# Patient Record
Sex: Female | Born: 1935 | Race: White | Hispanic: No | Marital: Single | Smoking: Former smoker
Health system: Southern US, Community
[De-identification: ages and names within clinical notes are randomized; demographics above are authoritative.]

## PROBLEM LIST (undated history)

## (undated) DIAGNOSIS — I10 Essential (primary) hypertension: Secondary | ICD-10-CM

## (undated) DIAGNOSIS — I5032 Chronic diastolic (congestive) heart failure: Secondary | ICD-10-CM

## (undated) DIAGNOSIS — I43 Cardiomyopathy in diseases classified elsewhere: Secondary | ICD-10-CM

## (undated) DIAGNOSIS — E785 Hyperlipidemia, unspecified: Secondary | ICD-10-CM

## (undated) DIAGNOSIS — E7800 Pure hypercholesterolemia, unspecified: Secondary | ICD-10-CM

## (undated) DIAGNOSIS — R000 Tachycardia, unspecified: Secondary | ICD-10-CM

## (undated) DIAGNOSIS — R413 Other amnesia: Secondary | ICD-10-CM

## (undated) DIAGNOSIS — E119 Type 2 diabetes mellitus without complications: Secondary | ICD-10-CM

## (undated) DIAGNOSIS — Z7901 Long term (current) use of anticoagulants: Secondary | ICD-10-CM

## (undated) DIAGNOSIS — K7689 Other specified diseases of liver: Secondary | ICD-10-CM

## (undated) DIAGNOSIS — I4891 Unspecified atrial fibrillation: Secondary | ICD-10-CM

## (undated) HISTORY — DX: Long term (current) use of anticoagulants: Z79.01

## (undated) HISTORY — PX: BACK SURGERY: SHX140

## (undated) HISTORY — DX: Essential (primary) hypertension: I10

## (undated) HISTORY — DX: Tachycardia, unspecified: R00.0

## (undated) HISTORY — DX: Other specified diseases of liver: K76.89

## (undated) HISTORY — DX: Cardiomyopathy in diseases classified elsewhere: I43

## (undated) HISTORY — DX: Chronic diastolic (congestive) heart failure: I50.32

## (undated) HISTORY — PX: APPENDECTOMY: SHX54

## (undated) HISTORY — DX: Pure hypercholesterolemia, unspecified: E78.00

## (undated) HISTORY — PX: TONSILLECTOMY: SUR1361

## (undated) HISTORY — PX: CHOLECYSTECTOMY: SHX55

---

## 2011-04-23 DIAGNOSIS — L608 Other nail disorders: Secondary | ICD-10-CM | POA: Diagnosis not present

## 2011-04-23 DIAGNOSIS — L821 Other seborrheic keratosis: Secondary | ICD-10-CM | POA: Diagnosis not present

## 2011-04-23 DIAGNOSIS — L819 Disorder of pigmentation, unspecified: Secondary | ICD-10-CM | POA: Diagnosis not present

## 2011-04-23 DIAGNOSIS — D1801 Hemangioma of skin and subcutaneous tissue: Secondary | ICD-10-CM | POA: Diagnosis not present

## 2011-08-13 DIAGNOSIS — Z0000 Encounter for general adult medical examination without abnormal findings: Secondary | ICD-10-CM | POA: Diagnosis not present

## 2011-08-13 DIAGNOSIS — E7800 Pure hypercholesterolemia, unspecified: Secondary | ICD-10-CM | POA: Diagnosis not present

## 2011-08-13 DIAGNOSIS — I10 Essential (primary) hypertension: Secondary | ICD-10-CM | POA: Diagnosis not present

## 2011-08-13 DIAGNOSIS — E119 Type 2 diabetes mellitus without complications: Secondary | ICD-10-CM | POA: Diagnosis not present

## 2011-08-13 DIAGNOSIS — G479 Sleep disorder, unspecified: Secondary | ICD-10-CM | POA: Diagnosis not present

## 2011-08-14 DIAGNOSIS — R7989 Other specified abnormal findings of blood chemistry: Secondary | ICD-10-CM | POA: Diagnosis not present

## 2011-08-14 DIAGNOSIS — E7800 Pure hypercholesterolemia, unspecified: Secondary | ICD-10-CM | POA: Diagnosis not present

## 2011-08-14 DIAGNOSIS — I10 Essential (primary) hypertension: Secondary | ICD-10-CM | POA: Diagnosis not present

## 2011-08-14 DIAGNOSIS — E119 Type 2 diabetes mellitus without complications: Secondary | ICD-10-CM | POA: Diagnosis not present

## 2011-08-14 DIAGNOSIS — Z79899 Other long term (current) drug therapy: Secondary | ICD-10-CM | POA: Diagnosis not present

## 2011-12-20 DIAGNOSIS — Z23 Encounter for immunization: Secondary | ICD-10-CM | POA: Diagnosis not present

## 2012-02-16 DIAGNOSIS — E119 Type 2 diabetes mellitus without complications: Secondary | ICD-10-CM | POA: Diagnosis not present

## 2012-02-16 DIAGNOSIS — I10 Essential (primary) hypertension: Secondary | ICD-10-CM | POA: Diagnosis not present

## 2012-03-17 DIAGNOSIS — K649 Unspecified hemorrhoids: Secondary | ICD-10-CM | POA: Diagnosis not present

## 2012-03-17 DIAGNOSIS — I10 Essential (primary) hypertension: Secondary | ICD-10-CM | POA: Diagnosis not present

## 2012-03-17 DIAGNOSIS — R42 Dizziness and giddiness: Secondary | ICD-10-CM | POA: Diagnosis not present

## 2012-03-26 DIAGNOSIS — R42 Dizziness and giddiness: Secondary | ICD-10-CM | POA: Diagnosis not present

## 2012-03-26 DIAGNOSIS — I10 Essential (primary) hypertension: Secondary | ICD-10-CM | POA: Diagnosis not present

## 2012-03-26 DIAGNOSIS — R9431 Abnormal electrocardiogram [ECG] [EKG]: Secondary | ICD-10-CM | POA: Diagnosis not present

## 2012-03-26 DIAGNOSIS — R002 Palpitations: Secondary | ICD-10-CM | POA: Diagnosis not present

## 2012-03-26 DIAGNOSIS — I498 Other specified cardiac arrhythmias: Secondary | ICD-10-CM | POA: Diagnosis not present

## 2012-04-01 DIAGNOSIS — I498 Other specified cardiac arrhythmias: Secondary | ICD-10-CM | POA: Diagnosis not present

## 2012-04-01 DIAGNOSIS — R002 Palpitations: Secondary | ICD-10-CM | POA: Diagnosis not present

## 2012-04-01 DIAGNOSIS — R9431 Abnormal electrocardiogram [ECG] [EKG]: Secondary | ICD-10-CM | POA: Diagnosis not present

## 2012-04-01 DIAGNOSIS — R42 Dizziness and giddiness: Secondary | ICD-10-CM | POA: Diagnosis not present

## 2012-04-01 DIAGNOSIS — I10 Essential (primary) hypertension: Secondary | ICD-10-CM | POA: Diagnosis not present

## 2012-05-10 DIAGNOSIS — I10 Essential (primary) hypertension: Secondary | ICD-10-CM | POA: Diagnosis not present

## 2012-05-10 DIAGNOSIS — E7800 Pure hypercholesterolemia, unspecified: Secondary | ICD-10-CM | POA: Diagnosis not present

## 2012-05-10 DIAGNOSIS — M545 Low back pain, unspecified: Secondary | ICD-10-CM | POA: Diagnosis not present

## 2012-05-10 DIAGNOSIS — M25559 Pain in unspecified hip: Secondary | ICD-10-CM | POA: Diagnosis not present

## 2012-06-15 ENCOUNTER — Other Ambulatory Visit: Payer: Self-pay | Admitting: Family Medicine

## 2012-06-21 ENCOUNTER — Other Ambulatory Visit: Payer: Self-pay

## 2012-06-24 ENCOUNTER — Ambulatory Visit
Admission: RE | Admit: 2012-06-24 | Discharge: 2012-06-24 | Disposition: A | Discharge status: Home / Self Care | Payer: Medicare Other | Source: Ambulatory Visit | Attending: Family Medicine | Admitting: Family Medicine

## 2012-06-24 DIAGNOSIS — K7689 Other specified diseases of liver: Secondary | ICD-10-CM

## 2012-07-29 DIAGNOSIS — I43 Cardiomyopathy in diseases classified elsewhere: Secondary | ICD-10-CM

## 2012-07-29 DIAGNOSIS — I428 Other cardiomyopathies: Secondary | ICD-10-CM

## 2012-07-29 HISTORY — DX: Other cardiomyopathies: I42.8

## 2012-07-29 HISTORY — DX: Cardiomyopathy in diseases classified elsewhere: I43

## 2012-08-02 ENCOUNTER — Encounter (HOSPITAL_COMMUNITY): Payer: Self-pay | Admitting: Neurology

## 2012-08-02 ENCOUNTER — Inpatient Hospital Stay (HOSPITAL_COMMUNITY)
Admission: EM | Admit: 2012-08-02 | Discharge: 2012-08-10 | DRG: 308 | Disposition: A | Discharge status: Home / Self Care | Payer: Medicare Other | Attending: Cardiology | Admitting: Cardiology

## 2012-08-02 ENCOUNTER — Emergency Department (HOSPITAL_COMMUNITY): Payer: Medicare Other

## 2012-08-02 DIAGNOSIS — E785 Hyperlipidemia, unspecified: Secondary | ICD-10-CM | POA: Diagnosis present

## 2012-08-02 DIAGNOSIS — E7800 Pure hypercholesterolemia, unspecified: Secondary | ICD-10-CM

## 2012-08-02 DIAGNOSIS — R0989 Other specified symptoms and signs involving the circulatory and respiratory systems: Secondary | ICD-10-CM | POA: Diagnosis not present

## 2012-08-02 DIAGNOSIS — I10 Essential (primary) hypertension: Secondary | ICD-10-CM | POA: Diagnosis present

## 2012-08-02 DIAGNOSIS — R000 Tachycardia, unspecified: Secondary | ICD-10-CM | POA: Diagnosis not present

## 2012-08-02 DIAGNOSIS — R42 Dizziness and giddiness: Secondary | ICD-10-CM | POA: Diagnosis not present

## 2012-08-02 DIAGNOSIS — I5021 Acute systolic (congestive) heart failure: Secondary | ICD-10-CM | POA: Diagnosis not present

## 2012-08-02 DIAGNOSIS — I509 Heart failure, unspecified: Secondary | ICD-10-CM | POA: Diagnosis not present

## 2012-08-02 DIAGNOSIS — R0600 Dyspnea, unspecified: Secondary | ICD-10-CM

## 2012-08-02 DIAGNOSIS — I4891 Unspecified atrial fibrillation: Principal | ICD-10-CM

## 2012-08-02 DIAGNOSIS — K7689 Other specified diseases of liver: Secondary | ICD-10-CM | POA: Insufficient documentation

## 2012-08-02 DIAGNOSIS — E119 Type 2 diabetes mellitus without complications: Secondary | ICD-10-CM | POA: Diagnosis present

## 2012-08-02 DIAGNOSIS — I079 Rheumatic tricuspid valve disease, unspecified: Secondary | ICD-10-CM | POA: Diagnosis present

## 2012-08-02 DIAGNOSIS — Z7901 Long term (current) use of anticoagulants: Secondary | ICD-10-CM

## 2012-08-02 DIAGNOSIS — R0602 Shortness of breath: Secondary | ICD-10-CM | POA: Diagnosis not present

## 2012-08-02 DIAGNOSIS — I379 Nonrheumatic pulmonary valve disorder, unspecified: Secondary | ICD-10-CM | POA: Diagnosis present

## 2012-08-02 DIAGNOSIS — Z940 Kidney transplant status: Secondary | ICD-10-CM | POA: Diagnosis not present

## 2012-08-02 DIAGNOSIS — I428 Other cardiomyopathies: Secondary | ICD-10-CM | POA: Diagnosis present

## 2012-08-02 DIAGNOSIS — I480 Paroxysmal atrial fibrillation: Secondary | ICD-10-CM | POA: Diagnosis present

## 2012-08-02 DIAGNOSIS — I499 Cardiac arrhythmia, unspecified: Secondary | ICD-10-CM | POA: Diagnosis not present

## 2012-08-02 DIAGNOSIS — Z5181 Encounter for therapeutic drug level monitoring: Secondary | ICD-10-CM

## 2012-08-02 DIAGNOSIS — R0609 Other forms of dyspnea: Secondary | ICD-10-CM | POA: Diagnosis not present

## 2012-08-02 DIAGNOSIS — I340 Nonrheumatic mitral (valve) insufficiency: Secondary | ICD-10-CM

## 2012-08-02 HISTORY — DX: Essential (primary) hypertension: I10

## 2012-08-02 HISTORY — DX: Hyperlipidemia, unspecified: E78.5

## 2012-08-02 HISTORY — DX: Type 2 diabetes mellitus without complications: E11.9

## 2012-08-02 LAB — MAGNESIUM: Magnesium: 2 mg/dL (ref 1.5–2.5)

## 2012-08-02 LAB — POCT I-STAT TROPONIN I: Troponin i, poc: 0 ng/mL (ref 0.00–0.08)

## 2012-08-02 LAB — PROTIME-INR
INR: 1.07 (ref 0.00–1.49)
Prothrombin Time: 13.8 seconds (ref 11.6–15.2)

## 2012-08-02 LAB — COMPREHENSIVE METABOLIC PANEL
ALT: 55 U/L — ABNORMAL HIGH (ref 0–35)
BUN: 15 mg/dL (ref 6–23)
CO2: 21 mEq/L (ref 19–32)
Calcium: 9.3 mg/dL (ref 8.4–10.5)
Glucose, Bld: 130 mg/dL — ABNORMAL HIGH (ref 70–99)
Sodium: 140 mEq/L (ref 135–145)
Total Bilirubin: 0.5 mg/dL (ref 0.3–1.2)

## 2012-08-02 LAB — CBC WITH DIFFERENTIAL/PLATELET
Eosinophils Absolute: 0.1 10*3/uL (ref 0.0–0.7)
Eosinophils Relative: 1 % (ref 0–5)
HCT: 38.5 % (ref 36.0–46.0)
Hemoglobin: 13.2 g/dL (ref 12.0–15.0)
Lymphocytes Relative: 21 % (ref 12–46)
Lymphs Abs: 1.8 10*3/uL (ref 0.7–4.0)
MCH: 33.3 pg (ref 26.0–34.0)
MCV: 97.2 fL (ref 78.0–100.0)
Monocytes Relative: 8 % (ref 3–12)
Platelets: 208 10*3/uL (ref 150–400)
RBC: 3.96 MIL/uL (ref 3.87–5.11)
WBC: 8.7 10*3/uL (ref 4.0–10.5)

## 2012-08-02 LAB — T4, FREE: Free T4: 1.61 ng/dL (ref 0.80–1.80)

## 2012-08-02 LAB — TROPONIN I: Troponin I: <0.3 ng/mL (ref <0.30)

## 2012-08-02 MED ORDER — METOPROLOL TARTRATE 1 MG/ML IV SOLN
5.0000 mg | Freq: Once | INTRAVENOUS | Status: AC
  Administered 2012-08-02: 5 mg via INTRAVENOUS
  Filled 2012-08-02: qty 5

## 2012-08-02 MED ORDER — ATORVASTATIN CALCIUM 10 MG PO TABS
10.0000 mg | ORAL_TABLET | Freq: Every day | ORAL | Status: DC
  Administered 2012-08-02 – 2012-08-10 (×9): 10 mg via ORAL
  Filled 2012-08-02 (×9): qty 1

## 2012-08-02 MED ORDER — METOPROLOL TARTRATE 25 MG PO TABS
25.0000 mg | ORAL_TABLET | Freq: Four times a day (QID) | ORAL | Status: DC
  Administered 2012-08-02 – 2012-08-03 (×3): 25 mg via ORAL
  Filled 2012-08-02 (×8): qty 1

## 2012-08-02 MED ORDER — RAMIPRIL 5 MG PO CAPS
5.0000 mg | ORAL_CAPSULE | Freq: Every day | ORAL | Status: DC
  Administered 2012-08-03 – 2012-08-04 (×2): 5 mg via ORAL
  Filled 2012-08-02 (×3): qty 1

## 2012-08-02 MED ORDER — METOPROLOL TARTRATE 1 MG/ML IV SOLN
INTRAVENOUS | Status: AC
  Administered 2012-08-02: 5 mg via INTRAVENOUS
  Filled 2012-08-02: qty 5

## 2012-08-02 MED ORDER — ONDANSETRON HCL 4 MG/2ML IJ SOLN: 4.0000 mg | Freq: Four times a day (QID) | INTRAMUSCULAR | Status: DC | PRN

## 2012-08-02 MED ORDER — PIOGLITAZONE HCL 45 MG PO TABS
45.0000 mg | ORAL_TABLET | Freq: Every day | ORAL | Status: DC
  Administered 2012-08-03 – 2012-08-10 (×8): 45 mg via ORAL
  Filled 2012-08-02 (×11): qty 1

## 2012-08-02 MED ORDER — SODIUM CHLORIDE 0.9 % IJ SOLN
3.0000 mL | INTRAMUSCULAR | Status: DC | PRN
  Administered 2012-08-03: 3 mL via INTRAVENOUS

## 2012-08-02 MED ORDER — METOPROLOL TARTRATE 1 MG/ML IV SOLN: 5.0000 mg | Freq: Once | INTRAVENOUS | Status: AC

## 2012-08-02 MED ORDER — SODIUM CHLORIDE 0.9 % IJ SOLN
3.0000 mL | Freq: Two times a day (BID) | INTRAMUSCULAR | Status: DC
  Administered 2012-08-02 – 2012-08-09 (×6): 3 mL via INTRAVENOUS

## 2012-08-02 MED ORDER — APIXABAN 2.5 MG PO TABS
2.5000 mg | ORAL_TABLET | Freq: Two times a day (BID) | ORAL | Status: DC
  Administered 2012-08-02 – 2012-08-10 (×16): 2.5 mg via ORAL
  Filled 2012-08-02 (×17): qty 1

## 2012-08-02 MED ORDER — DILTIAZEM HCL 100 MG IV SOLR
5.0000 mg/h | INTRAVENOUS | Status: DC
  Administered 2012-08-02: 5 mg/h via INTRAVENOUS

## 2012-08-02 MED ORDER — FUROSEMIDE 10 MG/ML IJ SOLN
40.0000 mg | Freq: Two times a day (BID) | INTRAMUSCULAR | Status: DC
  Administered 2012-08-02 – 2012-08-04 (×5): 40 mg via INTRAVENOUS
  Filled 2012-08-02 (×8): qty 4

## 2012-08-02 MED ORDER — SODIUM CHLORIDE 0.9 % IV SOLN
250.0000 mL | INTRAVENOUS | Status: DC | PRN
  Administered 2012-08-05: 250 mL via INTRAVENOUS

## 2012-08-02 MED ORDER — POTASSIUM CHLORIDE CRYS ER 20 MEQ PO TBCR
20.0000 meq | EXTENDED_RELEASE_TABLET | Freq: Two times a day (BID) | ORAL | Status: DC
  Administered 2012-08-02 – 2012-08-10 (×16): 20 meq via ORAL
  Filled 2012-08-02 (×19): qty 1

## 2012-08-02 MED ORDER — DILTIAZEM HCL 25 MG/5ML IV SOLN
10.0000 mg | Freq: Once | INTRAVENOUS | Status: AC
  Administered 2012-08-02: 10 mg via INTRAVENOUS

## 2012-08-02 MED ORDER — ACETAMINOPHEN 325 MG PO TABS: 650.0000 mg | ORAL_TABLET | ORAL | Status: DC | PRN

## 2012-08-02 MED ORDER — ZOLPIDEM TARTRATE 5 MG PO TABS
5.0000 mg | ORAL_TABLET | Freq: Every evening | ORAL | Status: DC | PRN
  Administered 2012-08-02 – 2012-08-09 (×8): 5 mg via ORAL
  Filled 2012-08-02 (×8): qty 1

## 2012-08-02 NOTE — ED Notes (Signed)
Pt eating dinner, HR 123. A x 4

## 2012-08-02 NOTE — H&P (Signed)
Admit date: 08/02/2012 Primary Physician  Dr. Barbaraann Barthel Primary Cardiologist  Dr. Mayford Knife  CC: Atrial fibrillation with rapid ventricular response and, shortness of breath  HPI: 79 year old female with previously diagnosed heart arrhythmia who used to see a cardiologist in [place] but recently moved up here to be with her 2 children who have chronic illnesses including kidney transplant, open heart surgery who is here in the emergency department with rapid atrial fibrillation. Over the past month or so she states that she's been becoming weaker and over the last week she has felt some shortness of breath. She states that she has had a mild cough but she attributes this to the pollen in the air. She denies any recent fevers, chills, bleeding episodes, strokelike symptoms. She states that her son is a Development worker, community. She does not recall ever being spoken to about Coumadin. She does not recall being told she has atrial fibrillation. She was however on digoxin which was discontinued by Dr. Mayford Knife during her referral visit because of bradycardia. She remained on diltiazem 180 mg once a day. She denies any orthopnea but she states she has had mild shortness of breath with activity and feels somewhat drained.  An echocardiogram 04/01/12 was performed which demonstrated normal ejection fraction with mild TR. Liver cysts were noted and are being followed by primary physician.      PMH:   Past Medical History  Diagnosis Date  . Hypertension   . Diabetes mellitus without complication   . Hyperlipidemia     PSH:   Past Surgical History  Procedure Laterality Date  . Back surgery     Allergies:  Codeine and Lortab Prior to Admit Meds:   (Not in a hospital admission) Fam HX:   No family history on file. Social HX:    History   Social History  . Marital Status: Single    Spouse Name: N/A    Number of Children: N/A  . Years of Education: N/A   Occupational History  . Not on file.   Social History Main  Topics  . Smoking status: Never Smoker   . Smokeless tobacco: Not on file  . Alcohol Use: Yes  . Drug Use: No  . Sexually Active: Not on file   Other Topics Concern  . Not on file   Social History Narrative  . No narrative on file     ROS:  All 11 ROS were addressed and are negative except what is stated in the HPI  Physical Exam: Blood pressure 128/89, pulse 158, temperature 97.4 F (36.3 C), temperature source Oral, resp. rate 21, SpO2 96.00%.    General: Thin, elderly female, in no acute distress  Head: Eyes PERRLA, No xanthomas.   Normal cephalic and atramatic  Lungs:   Clear bilaterally to auscultation and percussion. Normal respiratory effort. No wheezes, no rales. Heart:  Irregularly irregular, tachycardic Pulses are 2+ & equal.            No carotid bruit. No JVD.  No abdominal bruits. No femoral bruits. Abdomen: Bowel sounds are positive, abdomen soft and non-tender without masses No hepatosplenomegaly. Msk:  Back normal. Normal strength and tone for age. Extremities:   No clubbing, cyanosis. 1+ edema bilateral extremities.  DP +1 Neuro: Alert and oriented X 3, non-focal, MAE x 4 GU: Deferred Rectal: Deferred Psych:  Good affect, responds appropriately    Labs:   Lab Results  Component Value Date   WBC 8.7 08/02/2012   HGB 13.2 08/02/2012  HCT 38.5 08/02/2012   MCV 97.2 08/02/2012   PLT 208 08/02/2012    Recent Labs Lab 08/02/12 1400  NA 140  K 4.1  CL 106  CO2 21  BUN 15  CREATININE 0.75  CALCIUM 9.3  PROT 6.5  BILITOT 0.5  ALKPHOS 80  ALT 55*  AST 42*  GLUCOSE 130*   No results found for this basename: PTT   Lab Results  Component Value Date   INR 1.07 08/02/2012   CBC    Component Value Date/Time   WBC 8.7 08/02/2012 1400   RBC 3.96 08/02/2012 1400   HGB 13.2 08/02/2012 1400   HCT 38.5 08/02/2012 1400   PLT 208 08/02/2012 1400   MCV 97.2 08/02/2012 1400   MCH 33.3 08/02/2012 1400   MCHC 34.3 08/02/2012 1400   RDW 15.0 08/02/2012 1400   LYMPHSABS 1.8  08/02/2012 1400   MONOABS 0.7 08/02/2012 1400   EOSABS 0.1 08/02/2012 1400   BASOSABS 0.0 08/02/2012 1400   BNP (last 3 results)  Recent Labs  08/02/12 1349  PROBNP 1982.0*     Radiology:  Dg Chest Portable 1 View  08/02/2012  *RADIOLOGY REPORT*  Clinical Data: Shortness of breath, presents with symptoms of atrial fibrillation  PORTABLE CHEST - 1 VIEW  Comparison: None.  Findings:  There is obscuration of the left heart border is secondary to a small to moderate-sized left-sided pleural effusion and associated left basilar heterogeneous / consolidative opacities.  Small right- sided effusion with associated right basilar heterogeneous opacities.  Lung volumes are slightly reduced. The pulmonary vasculature is indistinct with cephalization of flow.  No definite pneumothorax.  No definite acute osseous abnormalities.  IMPRESSION: Overall findings suggestive of pulmonary edema with moderate left and small right-sided effusion and associated bibasilar opacities, atelectasis versus infiltrate.  A follow-up chest radiograph in 4 to 6 weeks after treatment is recommended to ensure resolution.   Original Report Authenticated By: Tacey Ruiz, MD    Personally viewed.   EKG:  AFIB with RVR 140's Personally viewed.  ASSESSMENT/PLAN:   79 year old female with newly diagnosed atrial fibrillation with rapid ventricular response, pleural effusion, shortness of breath, diastolic heart failure acute, with comorbidities of diabetes, hypertension, hyperlipidemia.  1. Atrial fibrillation-earlier in emergency room stay, IV diltiazem as well as drip up to 15 mg per hour did not reduce her heart rate. After administration of IV metoprolol 5 mg and discontinuation of diltiazem drip, her heart rate responded fairly well to this And when I was discussing her case with her, her heart rate was mostly in the 110s to 120s.  I will start her on by mouth metoprolol 25 mg by mouth every 6 hours of course monitoring her closely for  any evidence of bradycardia. Her digoxin was discontinued in January because of bradycardia. She did not wear Holter monitor at that time. Telemetry will be valuable for [place] here in the hospital. I will check TSH.  2. Chronic anticoagulation-we discussed at length the risk of stroke with atrial fibrillation and because of this, I will initiate anticoagulation with Eliquis 2.5 mg twice a day because of her age and weight. She is normally 122 pounds. Currently she is likely volume overloaded with her most recently being 132 pounds.   3. Acute diastolic heart failure-I will check an echocardiogram to ensure that she does not have any evidence of systolic dysfunction in the setting of tachycardia. I will give her IV furosemide 40 mg twice a day. Replete potassium as  necessary.  4. Diabetes-continue home medication.   5. Hypertension-I will continue ACE inhibitor.  6. Hyperlipidemia-I will continue Crestor 5 mg.  Donato Schultz, MD  08/02/2012  4:32 PM

## 2012-08-02 NOTE — ED Provider Notes (Addendum)
History     CSN: [id number]  Arrival date & time 08/02/12  1255   First MD Initiated Contact with Patient 08/02/12 1259      Chief Complaint  Patient presents with  . Atrial Fibrillation    (Consider location/radiation/quality/duration/timing/severity/associated sxs/prior treatment) HPI  Patient states she moved here from [place] about one year ago. She states she's had a irregular heartbeat for years and was followed regularly by a cardiologist in [place] with stress tests. She has never been on anti-coagulation except for a regular aspirin a day. She reports over the past 2 weeks she is started feeling like she is dragging and she feels mentally foggy and she has dizziness with no energy. She denies any chest pain but states she feels short of breath. She states she feels worse when she lays flat and she feels better if she sleeps sitting up in a chair. She also is noted her legs have been swelling over the past week. She has had a dry hacking cough without fever or congestion. She reports she has seen her doctor and had an EKG and echo done by Dr. Mayford Knife of [facility] cardiology. She is supposed to be getting a cardiac monitor placed soon.  PCP Dr Barbaraann Barthel Cardiologist Dr Mayford Knife  Past Medical History  Diagnosis Date  . Hypertension   . Diabetes mellitus without complication   . Hyperlipidemia     Past Surgical History  Procedure Laterality Date  . Back surgery      No family history on file.  History  Substance Use Topics  . Smoking status: Never Smoker   . Smokeless tobacco: Not on file  . Alcohol Use: Yes  lives at home Lives alone Drinks red wine with dinner  OB History   Grav Para Term Preterm Abortions TAB SAB Ect Mult Living                  Review of Systems  All other systems reviewed and are negative.    Allergies  Codeine and Lortab  Home Medications   Current Outpatient Rx  Name  Route  Sig  Dispense  Refill  . aspirin EC 325 MG tablet   Oral    Take 325 mg by mouth daily.         [name] Kitchen diltiazem (TIAZAC) 180 MG 24 hr capsule   Oral   Take 180 mg by mouth daily before breakfast.         . hydrocortisone (ANUSOL-HC) 25 MG suppository   Rectal   Place 25 mg rectally 2 (two) times daily.         . Multiple Vitamin (MULTIVITAMIN WITH MINERALS) TABS   Oral   Take 1 tablet by mouth daily.         [name] Kitchen OVER THE COUNTER MEDICATION   Oral   Take 200 mg by mouth at bedtime. Take two 100mg  stool softener capsules before bedtime         . pioglitazone (ACTOS) 45 MG tablet   Oral   Take 45 mg by mouth daily.         . Probiotic Product (PROBIOTIC DAILY PO)   Oral   Take by mouth.         . ramipril (ALTACE) 5 MG capsule   Oral   Take 5 mg by mouth daily.         . rosuvastatin (CRESTOR) 5 MG tablet   Oral   Take 5 mg by mouth daily.         [name] Kitchen  zolpidem (AMBIEN) 5 MG tablet   Oral   Take 5 mg by mouth at bedtime as needed for sleep.           BP 139/88  Pulse 156  Temp(Src) 97.4 F (36.3 C) (Oral)  Resp 22  SpO2 98%  Vital signs normal except tachycardia     Physical Exam  Nursing note and vitals reviewed. Constitutional: She is oriented to person, place, and time.  Non-toxic appearance. She does not appear ill. No distress.  Thin elderly female, pleasant and alert  HENT:  Head: Normocephalic and atraumatic.  Right Ear: External ear normal.  Left Ear: External ear normal.  Nose: Nose normal. No mucosal edema or rhinorrhea.  Mouth/Throat: Oropharynx is clear and moist and mucous membranes are normal. No dental abscesses or edematous.  Eyes: Conjunctivae and EOM are normal. Pupils are equal, round, and reactive to light.  Neck: Normal range of motion and full passive range of motion without pain. Neck supple.  Cardiovascular: Normal heart sounds.  An irregularly irregular rhythm present. Tachycardia present.  Exam reveals no gallop and no friction rub.   No murmur heard. Pulmonary/Chest: Effort  normal and breath sounds normal. No respiratory distress. She has no wheezes. She has no rhonchi. She has no rales. She exhibits no tenderness and no crepitus.  Abdominal: Soft. Normal appearance and bowel sounds are normal. She exhibits no distension. There is no tenderness. There is no rebound and no guarding.  Musculoskeletal: Normal range of motion. She exhibits edema. She exhibits no tenderness.  Moves all extremities well.  Mild swelling of legs without pitting  Neurological: She is alert and oriented to person, place, and time. She has normal strength. No cranial nerve deficit.  Skin: Skin is warm, dry and intact. No rash noted. No erythema. No pallor.  Psychiatric: She has a normal mood and affect. Her speech is normal and behavior is normal. Her mood appears not anxious.    ED Course  Procedures (including critical care time) Medications  diltiazem (CARDIZEM) 100 mg in dextrose 5 % 100 mL infusion (0 mg/hr Intravenous Stopped 08/02/12 1550)  diltiazem (CARDIZEM) injection 10 mg (0 mg Intravenous Stopped 08/02/12 1554)  metoprolol (LOPRESSOR) injection 5 mg (5 mg Intravenous Given 08/02/12 1551)    Review of paperwork sent from her doctor's office. Patient was seen by Dr. Mayford Knife in January and had an EKG done which showed bradycardia with heart rate of 52. At that time her digoxin was stopped. She also had a echocardiogram at that time which showed her ejection fraction was 56%. She had trace pulmonary valve regurgitation, mild tricuspid regurgitation, grade 2 diastolic dysfunction of the mitral valve with increased left atrial dilatation. Her inferior vena cava was also dilated. Her aortic valve was noted to be sclerotic but opening well.  15:45 pt still in afib with RVR on diltiazem 15 mg/hr and her HR is 146-160. Will stop the diltiazem drip and give lopressor. Pt given results of her tests.   15:54 Dr Anne Fu, will see patient, agrees with using lopressor and stopping the diltiazem.    16:30 Dr Anne Fu, asks to repeat the lopressor, it has brought her HR down to 120's range.   Results for orders placed during the hospital encounter of 08/02/12  CBC WITH DIFFERENTIAL      Result Value Range   WBC 8.7  4.0 - 10.5 K/uL   RBC 3.96  3.87 - 5.11 MIL/uL   Hemoglobin 13.2  12.0 - 15.0 g/dL  HCT 38.5  36.0 - 46.0 %   MCV 97.2  78.0 - 100.0 fL   MCH 33.3  26.0 - 34.0 pg   MCHC 34.3  30.0 - 36.0 g/dL   RDW 96.0  45.4 - 09.8 %   Platelets 208  150 - 400 K/uL   Neutrophils Relative 70  43 - 77 %   Neutro Abs 6.1  1.7 - 7.7 K/uL   Lymphocytes Relative 21  12 - 46 %   Lymphs Abs 1.8  0.7 - 4.0 K/uL   Monocytes Relative 8  3 - 12 %   Monocytes Absolute 0.7  0.1 - 1.0 K/uL   Eosinophils Relative 1  0 - 5 %   Eosinophils Absolute 0.1  0.0 - 0.7 K/uL   Basophils Relative 0  0 - 1 %   Basophils Absolute 0.0  0.0 - 0.1 K/uL  COMPREHENSIVE METABOLIC PANEL      Result Value Range   Sodium 140  135 - 145 mEq/L   Potassium 4.1  3.5 - 5.1 mEq/L   Chloride 106  96 - 112 mEq/L   CO2 21  19 - 32 mEq/L   Glucose, Bld 130 (*) 70 - 99 mg/dL   BUN 15  6 - 23 mg/dL   Creatinine, Ser 1.19  0.50 - 1.10 mg/dL   Calcium 9.3  8.4 - 14.7 mg/dL   Total Protein 6.5  6.0 - 8.3 g/dL   Albumin 3.4 (*) 3.5 - 5.2 g/dL   AST 42 (*) 0 - 37 U/L   ALT 55 (*) 0 - 35 U/L   Alkaline Phosphatase 80  39 - 117 U/L   Total Bilirubin 0.5  0.3 - 1.2 mg/dL   GFR calc non Af Amer 75 (*) >90 mL/min   GFR calc Af Amer 87 (*) >90 mL/min  PRO B NATRIURETIC PEPTIDE      Result Value Range   Pro B Natriuretic peptide (BNP) 1982.0 (*) 0 - 450 pg/mL  MAGNESIUM      Result Value Range   Magnesium 2.0  1.5 - 2.5 mg/dL  APTT      Result Value Range   aPTT 36  24 - 37 seconds  PROTIME-INR      Result Value Range   Prothrombin Time 13.8  11.6 - 15.2 seconds   INR 1.07  0.00 - 1.49  POCT I-STAT TROPONIN I      Result Value Range   Troponin i, poc 0.00  0.00 - 0.08 ng/mL   Comment 3             Laboratory  interpretation all normal except elevated BNP   Dg Chest Portable 1 View  08/02/2012  *RADIOLOGY REPORT*  Clinical Data: Shortness of breath, presents with symptoms of atrial fibrillation  PORTABLE CHEST - 1 VIEW  Comparison: None.  Findings:  There is obscuration of the left heart border is secondary to a small to moderate-sized left-sided pleural effusion and associated left basilar heterogeneous / consolidative opacities.  Small right- sided effusion with associated right basilar heterogeneous opacities.  Lung volumes are slightly reduced. The pulmonary vasculature is indistinct with cephalization of flow.  No definite pneumothorax.  No definite acute osseous abnormalities.  IMPRESSION: Overall findings suggestive of pulmonary edema with moderate left and small right-sided effusion and associated bibasilar opacities, atelectasis versus infiltrate.  A follow-up chest radiograph in 4 to 6 weeks after treatment is recommended to ensure resolution.   Original Report Authenticated By: Jonny Ruiz  Judithann Sheen, MD      Date: 08/02/2012  Rate: 163  Rhythm: atrial fibrillation  QRS Axis: normal  Intervals: normal  ST/T Wave abnormalities: nonspecific T wave changes  Conduction Disutrbances:none  Narrative Interpretation: PRWP  Old EKG Reviewed: changes noted from 03/17/2012 SB at rate 53    1. Atrial fibrillation with rapid ventricular response   2. CHF, acute   New onset  3. Dyspnea     Plan admission  Devoria Albe, MD, FACEP    MDM          Ward Givens, MD 08/02/12 5784  Ward Givens, MD 08/02/12 [phone]

## 2012-08-02 NOTE — ED Notes (Signed)
Per EMS- Pt comes from [facility] went in with weakness, dizziness, SOB for several weeks with SOB worsening over past few days. EKG showing AFIB with RVR HR 150, given 10 mg cardizem. BP 150/110. Pt a x 4. Denies pain.

## 2012-08-02 NOTE — ED Notes (Signed)
HR remains elevated AFIB rate 130-160. Denies any pain. Speaking in clear, concise sentences.

## 2012-08-02 NOTE — ED Notes (Signed)
Pt remains in AFIB HR 141. cardizem stopped per MD.

## 2012-08-03 DIAGNOSIS — I509 Heart failure, unspecified: Secondary | ICD-10-CM | POA: Diagnosis not present

## 2012-08-03 DIAGNOSIS — I10 Essential (primary) hypertension: Secondary | ICD-10-CM | POA: Diagnosis not present

## 2012-08-03 DIAGNOSIS — I4891 Unspecified atrial fibrillation: Secondary | ICD-10-CM | POA: Diagnosis not present

## 2012-08-03 LAB — TROPONIN I
Troponin I: <0.3 ng/mL (ref <0.30)
Troponin I: <0.3 ng/mL (ref <0.30)

## 2012-08-03 LAB — CBC
HCT: 36.2 % (ref 36.0–46.0)
MCHC: 34.3 g/dL (ref 30.0–36.0)
MCV: 96.8 fL (ref 78.0–100.0)
RDW: 15.1 % (ref 11.5–15.5)

## 2012-08-03 LAB — BASIC METABOLIC PANEL
BUN: 16 mg/dL (ref 6–23)
Creatinine, Ser: 0.85 mg/dL (ref 0.50–1.10)
GFR calc Af Amer: 70 mL/min — ABNORMAL LOW (ref >90)
GFR calc non Af Amer: 61 mL/min — ABNORMAL LOW (ref >90)
Potassium: 4 mEq/L (ref 3.5–5.1)

## 2012-08-03 LAB — GLUCOSE, CAPILLARY

## 2012-08-03 MED ORDER — METOPROLOL TARTRATE 50 MG PO TABS
50.0000 mg | ORAL_TABLET | Freq: Once | ORAL | Status: AC
  Administered 2012-08-03: 50 mg via ORAL
  Filled 2012-08-03: qty 1

## 2012-08-03 MED ORDER — DILTIAZEM LOAD VIA INFUSION
10.0000 mg | Freq: Once | INTRAVENOUS | Status: AC
  Administered 2012-08-03: 10 mg via INTRAVENOUS
  Filled 2012-08-03: qty 10

## 2012-08-03 MED ORDER — DILTIAZEM HCL 100 MG IV SOLR
5.0000 mg/h | INTRAVENOUS | Status: DC
  Administered 2012-08-03: 15 mg/h via INTRAVENOUS
  Administered 2012-08-03: 10 mg/h via INTRAVENOUS
  Administered 2012-08-03: 5 mg/h via INTRAVENOUS
  Filled 2012-08-03: qty 100

## 2012-08-03 MED ORDER — METOPROLOL TARTRATE 50 MG PO TABS
50.0000 mg | ORAL_TABLET | Freq: Two times a day (BID) | ORAL | Status: DC
  Filled 2012-08-03: qty 1

## 2012-08-03 MED ORDER — METOPROLOL TARTRATE 50 MG PO TABS
75.0000 mg | ORAL_TABLET | Freq: Two times a day (BID) | ORAL | Status: DC
  Administered 2012-08-03 – 2012-08-04 (×3): 75 mg via ORAL
  Filled 2012-08-03 (×5): qty 1

## 2012-08-03 NOTE — Progress Notes (Signed)
Patient still in afib with RVR with HR in with 120-130bpm range.  Discussed with Dr. Anne Fu who admitted patient.  She was placed on IV Cardizem gtt in ER on admit and was titrated up to 15mg /hr with no change in HR and then it was stopped.  She again has had no response to Cardizem so I will stop the gtt.  She has only had 25mg  of Lopressor today and so I have asked the nurse to give her an additional 50mg  of PO Lopressor and then we will increase her daily dose to 75mg  BID.  Her BP is stable.

## 2012-08-03 NOTE — Progress Notes (Signed)
SUBJECTIVE:  Still complains of significant LE edema and SOB  OBJECTIVE:   Vitals:   Filed Vitals:   08/02/12 1959 08/02/12 2326 08/03/12 0154 08/03/12 0347  BP: 115/59 126/62  123/85  Pulse: 75 82  93  Temp: 98 F (36.7 C)   97.9 F (36.6 C)  TempSrc: Oral   Oral  Resp: 20     Height:   5\' 5"  (1.651 m)   Weight:   61.9 kg (136 lb 7.4 oz)   SpO2: 98%   96%   I&O's:   Intake/Output Summary (Last 24 hours) at 08/03/12 0854 Last data filed at 08/03/12 0153  Gross per 24 hour  Intake    120 ml  Output   1600 ml  Net  -1480 ml   TELEMETRY: Reviewed telemetry pt in atrial fibrillation with RVR     PHYSICAL EXAM General: Well developed, well nourished, in no acute distress Head: Eyes PERRLA, No xanthomas.   Normal cephalic and atramatic  Lungs:   Crackles at bases and anteriorly Heart:  Irregularly irregular S1 S2 Pulses are 2+ & equal.  Abdomen: Bowel sounds are positive, abdomen soft and non-tender without masses Extremities:   2+ edema bilaterally Neuro: Alert and oriented X 3. Psych:  Good affect, responds appropriately   LABS: Basic Metabolic Panel:  Recent Labs  16/10/96 1400 08/03/12 0455  NA 140 141  K 4.1 4.0  CL 106 105  CO2 21 22  GLUCOSE 130* 145*  BUN 15 16  CREATININE 0.75 0.85  CALCIUM 9.3 8.7  MG 2.0  --    Liver Function Tests:  Recent Labs  08/02/12 1400  AST 42*  ALT 55*  ALKPHOS 80  BILITOT 0.5  PROT 6.5  ALBUMIN 3.4*   No results found for this basename: LIPASE, AMYLASE,  in the last 72 hours CBC:  Recent Labs  08/02/12 1400 08/03/12 0455  WBC 8.7 8.4  NEUTROABS 6.1  --   HGB 13.2 12.4  HCT 38.5 36.2  MCV 97.2 96.8  PLT 208 206   Cardiac Enzymes:  Recent Labs  08/02/12 1749 08/02/12 2329 08/03/12 0435  TROPONINI <0.30 <0.30 <0.30   BNP: No components found with this basename: POCBNP,  D-Dimer: No results found for this basename: DDIMER,  in the last 72 hours Hemoglobin A1C: No results found for this  basename: HGBA1C,  in the last 72 hours Fasting Lipid Panel: No results found for this basename: CHOL, HDL, LDLCALC, TRIG, CHOLHDL, LDLDIRECT,  in the last 72 hours Thyroid Function Tests:  Recent Labs  08/02/12 1750  TSH 1.541   Anemia Panel: No results found for this basename: VITAMINB12, FOLATE, FERRITIN, TIBC, IRON, RETICCTPCT,  in the last 72 hours Coag Panel:   Lab Results  Component Value Date   INR 1.07 08/02/2012    RADIOLOGY: Dg Chest Portable 1 View  08/02/2012  *RADIOLOGY REPORT*  Clinical Data: Shortness of breath, presents with symptoms of atrial fibrillation  PORTABLE CHEST - 1 VIEW  Comparison: None.  Findings:  There is obscuration of the left heart border is secondary to a small to moderate-sized left-sided pleural effusion and associated left basilar heterogeneous / consolidative opacities.  Small right- sided effusion with associated right basilar heterogeneous opacities.  Lung volumes are slightly reduced. The pulmonary vasculature is indistinct with cephalization of flow.  No definite pneumothorax.  No definite acute osseous abnormalities.  IMPRESSION: Overall findings suggestive of pulmonary edema with moderate left and small right-sided effusion and associated bibasilar  opacities, atelectasis versus infiltrate.  A follow-up chest radiograph in 4 to 6 weeks after treatment is recommended to ensure resolution.   Original Report Authenticated By: Tacey Ruiz, MD       ASSESSMENT:  1.  Atrial fibrillation with RVR still poorly rate controlled 2.  Systemic anticoagulation - Eliquis started yesterday 3.  Acute diastolic CHF - 2D echo pending - on IV Lasix with 1.4L out since admission 4.  HTN controlled 5.  DM  PLAN:   1.  Change Lopressor to 50mg  BID 2.  Cardizem 10mg  IV bolus and then start gtt at 5mg /hr 3.  Continue IV Lasix - she still has a significant amount of LE edema 4.  Check results of 2D echo  Quintella Reichert, MD  08/03/2012  8:54 AM

## 2012-08-03 NOTE — Progress Notes (Signed)
Cardizem 10 mg IV bolus given. After completion of bolus drip started at 5mg /hr. After 20 minutes increased to 10mg /hr HR still increased at 155. Blood pressure remains stable. Drip increased to 15 mg/hr. Will continue to monitor BP and HR closely. Dion Saucier

## 2012-08-04 DIAGNOSIS — I4891 Unspecified atrial fibrillation: Secondary | ICD-10-CM | POA: Diagnosis not present

## 2012-08-04 DIAGNOSIS — I10 Essential (primary) hypertension: Secondary | ICD-10-CM | POA: Diagnosis not present

## 2012-08-04 DIAGNOSIS — I509 Heart failure, unspecified: Secondary | ICD-10-CM | POA: Diagnosis not present

## 2012-08-04 LAB — GLUCOSE, CAPILLARY
Glucose-Capillary: 150 mg/dL — ABNORMAL HIGH (ref 70–99)
Glucose-Capillary: 178 mg/dL — ABNORMAL HIGH (ref 70–99)
Glucose-Capillary: 148 mg/dL — ABNORMAL HIGH (ref 70–99)

## 2012-08-04 MED ORDER — DILTIAZEM LOAD VIA INFUSION: 10.0000 mg | Freq: Once | INTRAVENOUS | Status: DC

## 2012-08-04 MED ORDER — DILTIAZEM LOAD VIA INFUSION
10.0000 mg | Freq: Once | INTRAVENOUS | Status: AC
  Administered 2012-08-04: 10 mg via INTRAVENOUS
  Filled 2012-08-04: qty 10

## 2012-08-04 MED ORDER — AMIODARONE HCL IN DEXTROSE 360-4.14 MG/200ML-% IV SOLN
30.0000 mg/h | INTRAVENOUS | Status: DC
  Administered 2012-08-04 – 2012-08-09 (×9): 30 mg/h via INTRAVENOUS
  Filled 2012-08-04 (×21): qty 200

## 2012-08-04 MED ORDER — AMIODARONE HCL IN DEXTROSE 360-4.14 MG/200ML-% IV SOLN
60.0000 mg/h | INTRAVENOUS | Status: DC
  Administered 2012-08-04 (×2): 60 mg/h via INTRAVENOUS
  Filled 2012-08-04 (×2): qty 200

## 2012-08-04 MED ORDER — OFF THE BEAT BOOK
Freq: Once | Status: AC
  Administered 2012-08-04: 12:00:00
  Filled 2012-08-04: qty 1

## 2012-08-04 MED ORDER — DILTIAZEM HCL 100 MG IV SOLR
5.0000 mg/h | INTRAVENOUS | Status: DC
  Filled 2012-08-04: qty 100

## 2012-08-04 MED ORDER — DILTIAZEM HCL 100 MG IV SOLR
5.0000 mg/h | INTRAVENOUS | Status: DC
  Administered 2012-08-04: 5 mg/h via INTRAVENOUS
  Filled 2012-08-04: qty 100

## 2012-08-04 MED ORDER — AMIODARONE LOAD VIA INFUSION
150.0000 mg | Freq: Once | INTRAVENOUS | Status: AC
  Administered 2012-08-04: 150 mg via INTRAVENOUS
  Filled 2012-08-04: qty 83.34

## 2012-08-04 NOTE — Care Management Note (Unsigned)
    Page 1 of 1   08/05/2012     4:09:19 PM   CARE MANAGEMENT NOTE 08/05/2012  Patient:  Cynthia Wright, Cynthia Wright   Account Number:  [id number]  Date Initiated:  08/04/2012  Documentation initiated by:  Hanako Tipping  Subjective/Objective Assessment:   PT ADM WITH AFIB WITH RVR ON 08/02/12.  PTA, PT INDEPENDENT, LIVES ALONE.     Action/Plan:   SUPPORTIVE DAUGHTER AT BEDSIDE STATES SHE WILL STAY WITH PT AT DC.  ELIQUIS FREE 30 DAY TRIAL CARD GIVEN TO PT.  PT INTERESTED IN LIFE ALERT SYSTEM...WILL PROVIDE INFO.   Anticipated DC Date:  08/05/2012   Anticipated DC Plan:  HOME/SELF CARE      DC Planning Services  CM consult      Choice offered to / List presented to:             Status of service:  In process, will continue to follow Medicare Important Message given?   (If response is "NO", the following Medicare IM given date fields will be blank) Date Medicare IM given:   Date Additional Medicare IM given:    Discharge Disposition:    Per UR Regulation:  Reviewed for med. necessity/level of care/duration of stay  If discussed at Long Length of Stay Meetings, dates discussed:    Comments:  08/05/12 Tayo Maute,RN,BSN [phone] PT GIVEN [employer] INFORMATION AND COUPON FOR FREE ACTIVATION.  SHE IS APPRECIATIVE OF HELP.

## 2012-08-04 NOTE — Progress Notes (Signed)
  Echocardiogram 2D Echocardiogram has been performed.  Cynthia Wright FRANCES 08/04/2012, 6:42 PM

## 2012-08-04 NOTE — Progress Notes (Signed)
Patient H.R. remains in the the 130-140 range At. Fib. Lopressor 75 mg p.o. Was given at 10 p.m. And Ambien 5mg  p.o. Given for sleep. Reassess patient and H.R. And check the trends on telemetry  Rate 130-140's. Dr Charm Barges  paged  And made aware of today's events and that patient had already been started on Cardizem I.V Times 2 today and d.c. Times 2 by M.D.due  To no effect n rate. And that patient had also been given Lopressor I.V. Today also.bp 128/60   Charge Nurse aware Dr. Charm Barges still wanted Cardizem drip started at 5mg /hr and given a 10mg  bolus. [name] Kitchen

## 2012-08-04 NOTE — Progress Notes (Signed)
Inpatient Diabetes Program Recommendations  AACE/ADA: New Consensus Statement on Inpatient Glycemic Control (2013)  Target Ranges:  Prepandial:   less than 140 mg/dL      Peak postprandial:   less than 180 mg/dL (1-2 hours)      Critically ill patients:  140 - 180 mg/dL   Reason for Visit: Hyperglycemia Results for Cynthia Wright, Cynthia Wright (MRN [id number]) as of 08/04/2012 13:35  Ref. Range 08/03/2012 05:57 08/03/2012 11:13 08/03/2012 20:48 08/04/2012 06:01 08/04/2012 11:19  Glucose-Capillary Latest Range: 70-99 mg/dL 409 (H) 811 (H) 914 (H) 150 (H) 178 (H)     Inpatient Diabetes Program Recommendations Correction (SSI): Add Novolog sensitive tidwc HgbA1C: Check HgbA1C to assess glycemic control prior to hospitalization Diet: Add CHO mod medium  Note: Will follow.  Thank you. Ailene Ards, RD, LDN, CDE Inpatient Diabetes Coordinator [phone]

## 2012-08-04 NOTE — Progress Notes (Signed)
SUBJECTIVE:  Events of last night noted with increased HR.  Started back on IV Cardizem with no signiifcant effect on HR  OBJECTIVE:   Vitals:   Filed Vitals:   08/03/12 2050 08/04/12 0005 08/04/12 0215 08/04/12 0517  BP: 120/80 128/60 103/80 116/90  Pulse: 136 142 110 87  Temp: 97.6 F (36.4 C)  97.6 F (36.4 C) 97.4 F (36.3 C)  TempSrc: Oral  Oral Oral  Resp: 18  18 18   Height:      Weight:      SpO2: 97%  92% 93%   I&O's:   Intake/Output Summary (Last 24 hours) at 08/04/12 0740 Last data filed at 08/04/12 0636  Gross per 24 hour  Intake   1310 ml  Output   2550 ml  Net  -1240 ml   TELEMETRY: Reviewed telemetry pt in afib with RVR     PHYSICAL EXAM General: Well developed, well nourished, in no acute distress Head: Eyes PERRLA, No xanthomas.   Normal cephalic and atramatic  Lungs:   Decreased BS at bases Heart:  Irregularly irregular and tacy S1 S2 Pulses are 2+ & equal. Abdomen: Bowel sounds are positive, abdomen soft and non-tender without masses Extremities:   1+ edema bilaterally  Neuro: Alert and oriented X 3. Psych:  Good affect, responds appropriately   LABS: Basic Metabolic Panel:  Recent Labs  16/10/96 1400 08/03/12 0455  NA 140 141  K 4.1 4.0  CL 106 105  CO2 21 22  GLUCOSE 130* 145*  BUN 15 16  CREATININE 0.75 0.85  CALCIUM 9.3 8.7  MG 2.0  --    Liver Function Tests:  Recent Labs  08/02/12 1400  AST 42*  ALT 55*  ALKPHOS 80  BILITOT 0.5  PROT 6.5  ALBUMIN 3.4*   No results found for this basename: LIPASE, AMYLASE,  in the last 72 hours CBC:  Recent Labs  08/02/12 1400 08/03/12 0455  WBC 8.7 8.4  NEUTROABS 6.1  --   HGB 13.2 12.4  HCT 38.5 36.2  MCV 97.2 96.8  PLT 208 206   Cardiac Enzymes:  Recent Labs  08/02/12 1749 08/02/12 2329 08/03/12 0435  TROPONINI <0.30 <0.30 <0.30   Thyroid Function Tests:  Recent Labs  08/02/12 1750  TSH 1.541   Anemia Panel: No results found for this basename: VITAMINB12,  FOLATE, FERRITIN, TIBC, IRON, RETICCTPCT,  in the last 72 hours Coag Panel:   Lab Results  Component Value Date   INR 1.07 08/02/2012    RADIOLOGY: Dg Chest Portable 1 View  08/02/2012  *RADIOLOGY REPORT*  Clinical Data: Shortness of breath, presents with symptoms of atrial fibrillation  PORTABLE CHEST - 1 VIEW  Comparison: None.  Findings:  There is obscuration of the left heart border is secondary to a small to moderate-sized left-sided pleural effusion and associated left basilar heterogeneous / consolidative opacities.  Small right- sided effusion with associated right basilar heterogeneous opacities.  Lung volumes are slightly reduced. The pulmonary vasculature is indistinct with cephalization of flow.  No definite pneumothorax.  No definite acute osseous abnormalities.  IMPRESSION: Overall findings suggestive of pulmonary edema with moderate left and small right-sided effusion and associated bibasilar opacities, atelectasis versus infiltrate.  A follow-up chest radiograph in 4 to 6 weeks after treatment is recommended to ensure resolution.   Original Report Authenticated By: Tacey Ruiz, MD     ASSESSMENT:  1. Atrial fibrillation with RVR still poorly rate controlled  2. Systemic anticoagulation - Eliquis started  yesterday  3. Acute diastolic CHF - 2D echo pending - on IV Lasix with 1.4L out since admission  4. HTN controlled  5. DM   PLAN:  1. Continue Lopressor to 75mg  BID  2. D/C Cardizem gtt since she does not respond to Cardizem even at high doses 3. Continue IV Lasix - she still has a significant amount of LE edema  4. Check results of 2D echo 5. Start IV Amio gtt for rate control    Quintella Reichert, MD  08/04/2012  7:40 AM

## 2012-08-05 DIAGNOSIS — I509 Heart failure, unspecified: Secondary | ICD-10-CM | POA: Diagnosis not present

## 2012-08-05 DIAGNOSIS — Z7901 Long term (current) use of anticoagulants: Secondary | ICD-10-CM | POA: Diagnosis not present

## 2012-08-05 DIAGNOSIS — I4891 Unspecified atrial fibrillation: Secondary | ICD-10-CM | POA: Diagnosis not present

## 2012-08-05 DIAGNOSIS — I10 Essential (primary) hypertension: Secondary | ICD-10-CM | POA: Diagnosis not present

## 2012-08-05 DIAGNOSIS — I5021 Acute systolic (congestive) heart failure: Secondary | ICD-10-CM | POA: Diagnosis not present

## 2012-08-05 LAB — GLUCOSE, CAPILLARY

## 2012-08-05 MED ORDER — MAGNESIUM HYDROXIDE 400 MG/5ML PO SUSP: 30.0000 mL | Freq: Every day | ORAL | Status: DC | PRN

## 2012-08-05 MED ORDER — AMIODARONE IV BOLUS ONLY 150 MG/100ML
150.0000 mg | Freq: Once | INTRAVENOUS | Status: AC
  Administered 2012-08-05: 150 mg via INTRAVENOUS
  Filled 2012-08-05: qty 100

## 2012-08-05 MED ORDER — FUROSEMIDE 40 MG PO TABS
40.0000 mg | ORAL_TABLET | Freq: Two times a day (BID) | ORAL | Status: DC
  Administered 2012-08-05 – 2012-08-10 (×11): 40 mg via ORAL
  Filled 2012-08-05 (×13): qty 1

## 2012-08-05 MED ORDER — METOPROLOL TARTRATE 100 MG PO TABS
100.0000 mg | ORAL_TABLET | Freq: Two times a day (BID) | ORAL | Status: DC
  Administered 2012-08-05 – 2012-08-08 (×7): 100 mg via ORAL
  Filled 2012-08-05 (×10): qty 1

## 2012-08-05 MED ORDER — POLYETHYLENE GLYCOL 3350 17 G PO PACK
17.0000 g | PACK | Freq: Every day | ORAL | Status: DC | PRN
  Administered 2012-08-05: 17 g via ORAL
  Filled 2012-08-05: qty 1

## 2012-08-05 NOTE — Progress Notes (Addendum)
SUBJECTIVE:  No complaints but HR still not adequately controlled  OBJECTIVE:   Vitals:   Filed Vitals:   08/04/12 1100 08/04/12 1400 08/04/12 2151 08/05/12 0453  BP: 129/75 109/81 119/78 112/71  Pulse: 103 111 123 91  Temp:  98 F (36.7 C) 97.3 F (36.3 C) 97.5 F (36.4 C)  TempSrc:  Oral Oral Oral  Resp:  19 18 18   Height:      Weight:    62.8 kg (138 lb 7.2 oz)  SpO2:  94% 97% 98%   I&O's:   Intake/Output Summary (Last 24 hours) at 08/05/12 8295 Last data filed at 08/05/12 0600  Gross per 24 hour  Intake 1160.4 ml  Output   1450 ml  Net -289.6 ml   TELEMETRY: Reviewed telemetry pt in afib with HR 120-130bpm:     PHYSICAL EXAM General: Well developed, well nourished, in no acute distress Head: Eyes PERRLA, No xanthomas.   Normal cephalic and atramatic  Lungs:   Clear bilaterally to auscultation and percussion. Heart:   Irregularly irregular and tachy S1 S2 Pulses are 2+ & equal. Abdomen: Bowel sounds are positive, abdomen soft and non-tender without masses Extremities:  Trace edema Neuro: Alert and oriented X 3. Psych:  Good affect, responds appropriately   LABS: Basic Metabolic Panel:  Recent Labs  62/13/08 1400 08/03/12 0455  NA 140 141  K 4.1 4.0  CL 106 105  CO2 21 22  GLUCOSE 130* 145*  BUN 15 16  CREATININE 0.75 0.85  CALCIUM 9.3 8.7  MG 2.0  --    Liver Function Tests:  Recent Labs  08/02/12 1400  AST 42*  ALT 55*  ALKPHOS 80  BILITOT 0.5  PROT 6.5  ALBUMIN 3.4*   No results found for this basename: LIPASE, AMYLASE,  in the last 72 hours CBC:  Recent Labs  08/02/12 1400 08/03/12 0455  WBC 8.7 8.4  NEUTROABS 6.1  --   HGB 13.2 12.4  HCT 38.5 36.2  MCV 97.2 96.8  PLT 208 206   Cardiac Enzymes:  Recent Labs  08/02/12 1749 08/02/12 2329 08/03/12 0435  TROPONINI <0.30 <0.30 <0.30   Thyroid Function Tests:  Recent Labs  08/02/12 1750  TSH 1.541   Anemia Panel: No results found for this basename: VITAMINB12,  FOLATE, FERRITIN, TIBC, IRON, RETICCTPCT,  in the last 72 hours Coag Panel:   Lab Results  Component Value Date   INR 1.07 08/02/2012    RADIOLOGY: Dg Chest Portable 1 View  08/02/2012  *RADIOLOGY REPORT*  Clinical Data: Shortness of breath, presents with symptoms of atrial fibrillation  PORTABLE CHEST - 1 VIEW  Comparison: None.  Findings:  There is obscuration of the left heart border is secondary to a small to moderate-sized left-sided pleural effusion and associated left basilar heterogeneous / consolidative opacities.  Small right- sided effusion with associated right basilar heterogeneous opacities.  Lung volumes are slightly reduced. The pulmonary vasculature is indistinct with cephalization of flow.  No definite pneumothorax.  No definite acute osseous abnormalities.  IMPRESSION: Overall findings suggestive of pulmonary edema with moderate left and small right-sided effusion and associated bibasilar opacities, atelectasis versus infiltrate.  A follow-up chest radiograph in 4 to 6 weeks after treatment is recommended to ensure resolution.   Original Report Authenticated By: Tacey Ruiz, MD    ASSESSMENT:  1. Atrial fibrillation with RVR still not rate controlled  2. Systemic anticoagulation - Eliquis  3. Acute diastolic CHF - 2D echo pending - on  IV Lasix with net 3L out since admission  4. HTN controlled  5. DM  PLAN:  1. Increase Lopressor to 100mg  BID for better rate controls  2. Change Lasix to PO 3. Check results of 2D echo  4. Continue IV Amio gtt and give a bolus of 150mg  to try to slow HR 5.  Stop ACE I so we have stable BP if we need to increase beta blocker further       Quintella Reichert, MD  08/05/2012  8:33 AM

## 2012-08-06 DIAGNOSIS — I340 Nonrheumatic mitral (valve) insufficiency: Secondary | ICD-10-CM | POA: Diagnosis present

## 2012-08-06 DIAGNOSIS — I4891 Unspecified atrial fibrillation: Secondary | ICD-10-CM | POA: Diagnosis not present

## 2012-08-06 DIAGNOSIS — I509 Heart failure, unspecified: Secondary | ICD-10-CM | POA: Diagnosis not present

## 2012-08-06 DIAGNOSIS — I10 Essential (primary) hypertension: Secondary | ICD-10-CM | POA: Diagnosis not present

## 2012-08-06 LAB — PRO B NATRIURETIC PEPTIDE: Pro B Natriuretic peptide (BNP): 2550 pg/mL — ABNORMAL HIGH (ref 0–450)

## 2012-08-06 LAB — GLUCOSE, CAPILLARY
Glucose-Capillary: 138 mg/dL — ABNORMAL HIGH (ref 70–99)
Glucose-Capillary: 144 mg/dL — ABNORMAL HIGH (ref 70–99)
Glucose-Capillary: 139 mg/dL — ABNORMAL HIGH (ref 70–99)

## 2012-08-06 LAB — BASIC METABOLIC PANEL
Calcium: 9.5 mg/dL (ref 8.4–10.5)
Creatinine, Ser: 1 mg/dL (ref 0.50–1.10)
GFR calc non Af Amer: 50 mL/min — ABNORMAL LOW (ref >90)
Sodium: 138 mEq/L (ref 135–145)

## 2012-08-06 MED ORDER — DIGOXIN 125 MCG PO TABS
0.1250 mg | ORAL_TABLET | Freq: Every day | ORAL | Status: DC
  Administered 2012-08-07 – 2012-08-08 (×2): 0.125 mg via ORAL
  Filled 2012-08-06 (×4): qty 1

## 2012-08-06 MED ORDER — DIGOXIN 0.25 MG/ML IJ SOLN
0.2500 mg | Freq: Four times a day (QID) | INTRAMUSCULAR | Status: AC
  Administered 2012-08-06 (×2): 0.25 mg via INTRAVENOUS
  Filled 2012-08-06 (×2): qty 1

## 2012-08-06 MED ORDER — WITCH HAZEL-GLYCERIN EX PADS
MEDICATED_PAD | CUTANEOUS | Status: DC | PRN
  Filled 2012-08-06: qty 100

## 2012-08-06 MED ORDER — LISINOPRIL 5 MG PO TABS
5.0000 mg | ORAL_TABLET | Freq: Every day | ORAL | Status: DC
  Administered 2012-08-06 – 2012-08-10 (×5): 5 mg via ORAL
  Filled 2012-08-06 (×5): qty 1

## 2012-08-06 NOTE — Progress Notes (Signed)
SUBJECTIVE:  No complaints  OBJECTIVE:   Vitals:   Filed Vitals:   08/05/12 1907 08/05/12 2007 08/05/12 2208 08/06/12 0447  BP:  134/93 128/80 117/77  Pulse: 118 125 115 98  Temp:  97.3 F (36.3 C)  97.8 F (36.6 C)  TempSrc:  Oral  Oral  Resp:  18  18  Height:      Weight:    60.283 kg (132 lb 14.4 oz)  SpO2:  100%  98%   I&O's:   Intake/Output Summary (Last 24 hours) at 08/06/12 0753 Last data filed at 08/05/12 2009  Gross per 24 hour  Intake   1120 ml  Output   1800 ml  Net   -680 ml   TELEMETRY: Reviewed telemetry pt in atrial fibrillation at 106bpm     PHYSICAL EXAM General: Well developed, well nourished, in no acute distress Head: Eyes PERRLA, No xanthomas.   Normal cephalic and atramatic  Lungs:   Clear bilaterally to auscultation and percussion. Heart:   Irregularly irregular S1 S2 Pulses are 2+ & equal. Abdomen: Bowel sounds are positive, abdomen soft and non-tender without masses  Extremities:   No clubbing, cyanosis or edema.  DP +1 Neuro: Alert and oriented X 3. Psych:  Good affect, responds appropriately   LABS:  Lab Results  Component Value Date   INR 1.07 08/02/2012    RADIOLOGY: Dg Chest Portable 1 View  08/02/2012  *RADIOLOGY REPORT*  Clinical Data: Shortness of breath, presents with symptoms of atrial fibrillation  PORTABLE CHEST - 1 VIEW  Comparison: None.  Findings:  There is obscuration of the left heart border is secondary to a small to moderate-sized left-sided pleural effusion and associated left basilar heterogeneous / consolidative opacities.  Small right- sided effusion with associated right basilar heterogeneous opacities.  Lung volumes are slightly reduced. The pulmonary vasculature is indistinct with cephalization of flow.  No definite pneumothorax.  No definite acute osseous abnormalities.  IMPRESSION: Overall findings suggestive of pulmonary edema with moderate left and small right-sided effusion and associated bibasilar opacities,  atelectasis versus infiltrate.  A follow-up chest radiograph in 4 to 6 weeks after treatment is recommended to ensure resolution.   Original Report Authenticated By: Tacey Ruiz, MD     ASSESSMENT:  1. Atrial fibrillation with RVR still not rate controlled  2. Systemic anticoagulation - Eliquis  3. Acute systolic CHF with EF 35-40% on echo - on IV Lasix with net 3.5L out since admission - ? Tachycardia induced CM or Severe MR leading to LV dysfunction 4. HTN controlled  5. DM  6. Moderate to severe MR by echo  PLAN:  1. Continue Lopressor to 100mg  BID  2. Continue PO Lasix - her lungs are clear and she has no edema in LE 3.. Continue IV Amio gtt  4.  Start Digoxin 0.25mg  IV now and repeat in 6 hours then 0.125mg  daily 5. Restart ACE I for afterload reduction 6.  I would like to hold off on TEE/DCCV for now and see if we can get her rate controlled by adding digoxin.  Her HR has improved on IV Amio.  Since her LVF is reduced and she has significant MR I am concerned that she may not hold in NSR if we cardiovert her today.  I would like her to have a longer chance to load with amio before DCCV.  If we cannot get her rate controlled with dig then will need to proceed with TEE/DCCV on Monday.  Quintella Reichert, MD  08/06/2012  7:53 AM

## 2012-08-07 DIAGNOSIS — I4891 Unspecified atrial fibrillation: Secondary | ICD-10-CM | POA: Diagnosis not present

## 2012-08-07 DIAGNOSIS — Z7901 Long term (current) use of anticoagulants: Secondary | ICD-10-CM | POA: Diagnosis not present

## 2012-08-07 DIAGNOSIS — I5021 Acute systolic (congestive) heart failure: Secondary | ICD-10-CM | POA: Diagnosis not present

## 2012-08-07 DIAGNOSIS — I509 Heart failure, unspecified: Secondary | ICD-10-CM | POA: Diagnosis not present

## 2012-08-07 LAB — GLUCOSE, CAPILLARY
Glucose-Capillary: 112 mg/dL — ABNORMAL HIGH (ref 70–99)
Glucose-Capillary: 124 mg/dL — ABNORMAL HIGH (ref 70–99)

## 2012-08-07 LAB — BASIC METABOLIC PANEL
BUN: 15 mg/dL (ref 6–23)
CO2: 27 mEq/L (ref 19–32)
Chloride: 101 mEq/L (ref 96–112)
Creatinine, Ser: 0.96 mg/dL (ref 0.50–1.10)
Potassium: 3.4 mEq/L — ABNORMAL LOW (ref 3.5–5.1)

## 2012-08-07 NOTE — Progress Notes (Signed)
Patient Name: Cynthia Wright Date of Encounter: 08/07/2012    SUBJECTIVE: the patient is having no symptoms. She asks many questions concerning her prognosis and the natural history of both atrial fibrillation and heart failure.  TELEMETRY:  Atrial fibrillation with poor rate control, trending above 100 beats per minute Filed Vitals:   08/06/12 1041 08/06/12 1345 08/06/12 2022 08/07/12 0416  BP: 118/80 124/76 123/66 108/70  Pulse:  100 54 85  Temp:  97.6 F (36.4 C) 97.6 F (36.4 C) 97.9 F (36.6 C)  TempSrc:  Oral Oral Oral  Resp:  20 18 19   Height:      Weight:    59.693 kg (131 lb 9.6 oz)  SpO2:  97% 98% 95%    Intake/Output Summary (Last 24 hours) at 08/07/12 1009 Last data filed at 08/07/12 0522  Gross per 24 hour  Intake 1026.79 ml  Output   1300 ml  Net -273.21 ml    LABS: Basic Metabolic Panel:  Recent Labs  40/98/11 0909 08/07/12 0555  NA 138 140  K 4.3 3.4*  CL 99 101  CO2 29 27  GLUCOSE 233* 133*  BUN 17 15  CREATININE 1.00 0.96  CALCIUM 9.5 8.4   Radiology/Studies:  No new data  Physical Exam: Blood pressure 108/70, pulse 85, temperature 97.9 F (36.6 C), temperature source Oral, resp. rate 19, height 5\' 5"  (1.651 m), weight 59.693 kg (131 lb 9.6 oz), SpO2 95.00%. Weight change: -0.59 kg (-1 lb 4.8 oz)   Sitting comfortably without dyspnea  No peripheral edema.  Lungs clear to auscultation and percussion.  Cardiac exam irregularly irregular rhythm with poor rate control.  No abdominal ascites  ASSESSMENT:  1. Atrial fibrillation with poor rate control despite IV amiodarone, beta blocker, and the recent addition of digoxin.  2. Acute systolic heart failure likely tachycardia induced  3. Anticoagulation  Plan:  1. Continue loading with amiodarone and adjusting beta blocker dose to control rate.  2. If inadequate rate control, will need TEE cardioversion before discharge  3. Continue anticoagulation with apixaban  Selinda Eon 08/07/2012, 10:09 AM

## 2012-08-08 DIAGNOSIS — I4891 Unspecified atrial fibrillation: Secondary | ICD-10-CM | POA: Diagnosis not present

## 2012-08-08 DIAGNOSIS — I5021 Acute systolic (congestive) heart failure: Secondary | ICD-10-CM | POA: Diagnosis not present

## 2012-08-08 DIAGNOSIS — I509 Heart failure, unspecified: Secondary | ICD-10-CM | POA: Diagnosis not present

## 2012-08-08 DIAGNOSIS — Z7901 Long term (current) use of anticoagulants: Secondary | ICD-10-CM | POA: Diagnosis not present

## 2012-08-08 LAB — GLUCOSE, CAPILLARY
Glucose-Capillary: 126 mg/dL — ABNORMAL HIGH (ref 70–99)
Glucose-Capillary: 139 mg/dL — ABNORMAL HIGH (ref 70–99)
Glucose-Capillary: 137 mg/dL — ABNORMAL HIGH (ref 70–99)

## 2012-08-08 LAB — BASIC METABOLIC PANEL
Calcium: 8.4 mg/dL (ref 8.4–10.5)
Chloride: 101 mEq/L (ref 96–112)
Creatinine, Ser: 1 mg/dL (ref 0.50–1.10)
GFR calc Af Amer: 58 mL/min — ABNORMAL LOW (ref >90)
GFR calc non Af Amer: 50 mL/min — ABNORMAL LOW (ref >90)

## 2012-08-08 NOTE — Progress Notes (Signed)
Patient Name: Cynthia Wright Date of Encounter: 08/08/2012    SUBJECTIVE: Feels better. Has some reservations about cardioversion/TEE. We had a good discussion and she will speak further to Dr. Mayford Knife.  TELEMETRY:  A fib with poor control and Ave VR 95-100. Slowly improving on IV amio. Filed Vitals:   08/07/12 0416 08/07/12 1405 08/07/12 1949 08/08/12 0448  BP: 108/70 110/55 116/74 106/57  Pulse: 85 78 101 80  Temp: 97.9 F (36.6 C) 98.6 F (37 C) 97.2 F (36.2 C) 97.8 F (36.6 C)  TempSrc: Oral Oral Oral Oral  Resp: 19 19 18 18   Height:      Weight: 59.693 kg (131 lb 9.6 oz)   60.1 kg (132 lb 7.9 oz)  SpO2: 95% 99% 95% 98%    Intake/Output Summary (Last 24 hours) at 08/08/12 1303 Last data filed at 08/08/12 0730  Gross per 24 hour  Intake 1520.4 ml  Output   2850 ml  Net -1329.6 ml    LABS: Basic Metabolic Panel:  Recent Labs  09/81/19 0555 08/08/12 0505  NA 140 141  K 3.4* 3.4*  CL 101 101  CO2 27 29  GLUCOSE 133* 130*  BUN 15 15  CREATININE 0.96 1.00  CALCIUM 8.4 8.4    Radiology/Studies:  No new data  Physical Exam: Blood pressure 106/57, pulse 80, temperature 97.8 F (36.6 C), temperature source Oral, resp. rate 18, height 5\' 5"  (1.651 m), weight 60.1 kg (132 lb 7.9 oz), SpO2 98.00%. Weight change: 0.407 kg (14.3 oz)   Neck veins flat Chest is clear Cardiac reveals rate to be poorly controlled with no murmur or gallop No edema.  ASSESSMENT: 1. Atrial fibrillation with poor but improving rate on  amiodarone, beta blocker, and the recent addition of digoxin.  2. Acute systolic heart failure likely tachycardia induced . No clinical CHF at this time. 3. Anticoagulation with Apixaban  Plan:  1. NPO for possible TEE cardioversion in AM. Patient is somewhat reluctant 2. Continue IV amio Signed, Lesleigh Noe 08/08/2012, 1:03 PM

## 2012-08-08 NOTE — Progress Notes (Signed)
MEDICATION RELATED NOTE   Pharmacy Re:  Digoxin Indication: Afib  Allergies  Allergen Reactions  . Codeine   . Lortab (Hydrocodone-Acetaminophen)    Patient Measurements: Height: 5\' 5"  (165.1 cm) Weight: 132 lb 7.9 oz (60.1 kg) IBW/kg (Calculated) : 57  Vital Signs: Temp: 97.8 F (36.6 C) (05/11 0448) Temp src: Oral (05/11 0448) BP: 106/57 mmHg (05/11 0448) Pulse Rate: 80 (05/11 0448) Intake/Output from previous day: 05/10 0701 - 05/11 0700 In: 1640.4 [P.O.:1320; I.V.:320.4] Out: 2300 [Urine:2300] Intake/Output from this shift: Total I/O In: 360 [P.O.:360] Out: 1200 [Urine:1200]  Labs:  Recent Labs  08/06/12 0909 08/07/12 0555 08/08/12 0505  CREATININE 1.00 0.96 1.00   Estimated Creatinine Clearance: 37 ml/min (by C-G formula based on Cr of 1).   Medications:  Scheduled:  . apixaban  2.5 mg Oral BID  . atorvastatin  10 mg Oral q1800  . digoxin  0.125 mg Oral Daily  . furosemide  40 mg Oral BID  . lisinopril  5 mg Oral Daily  . metoprolol tartrate  100 mg Oral BID  . pioglitazone  45 mg Oral Q breakfast  . potassium chloride  20 mEq Oral BID  . sodium chloride  3 mL Intravenous Q12H   Assessment: 79 yo female admitted with rapid atrial fib.  She is currently on IV Amiodarone, and oral Metoprolol and Digoxin for rate control.  Her creatinine is 1.0 and her estimated clearance is ~ 37 ml/min.  Based on this information along with her weight, her estimated daily Digoxin clearance is ~ 27%.  Her maintenance dose is calculated to be 0.125mg  daily.  However, she is receiving Amiodarone which can cause increased levels of Digoxin.  Recommendation: Consider checking a Digoxin level in ~ 1 week if you feel she will be continued on both Amiodarone and Digoxin just to ensure appropriate clearance.  Nadara Mustard, PharmD., MS Clinical Pharmacist Pager:  [phone] Thank you for allowing pharmacy to be part of this patients care team. 08/08/2012,11:04 AM

## 2012-08-09 ENCOUNTER — Inpatient Hospital Stay (HOSPITAL_COMMUNITY): Payer: Medicare Other | Admitting: Anesthesiology

## 2012-08-09 ENCOUNTER — Encounter (HOSPITAL_COMMUNITY)
Admission: EM | Disposition: A | Discharge status: Home / Self Care | Payer: Self-pay | Source: Home / Self Care | Attending: Cardiology

## 2012-08-09 ENCOUNTER — Encounter (HOSPITAL_COMMUNITY): Payer: Self-pay | Admitting: Anesthesiology

## 2012-08-09 ENCOUNTER — Encounter (HOSPITAL_COMMUNITY): Payer: Self-pay | Admitting: Gastroenterology

## 2012-08-09 DIAGNOSIS — E119 Type 2 diabetes mellitus without complications: Secondary | ICD-10-CM | POA: Diagnosis not present

## 2012-08-09 DIAGNOSIS — I5021 Acute systolic (congestive) heart failure: Secondary | ICD-10-CM | POA: Diagnosis not present

## 2012-08-09 DIAGNOSIS — I428 Other cardiomyopathies: Secondary | ICD-10-CM | POA: Diagnosis not present

## 2012-08-09 DIAGNOSIS — I10 Essential (primary) hypertension: Secondary | ICD-10-CM | POA: Diagnosis not present

## 2012-08-09 DIAGNOSIS — I509 Heart failure, unspecified: Secondary | ICD-10-CM | POA: Diagnosis not present

## 2012-08-09 DIAGNOSIS — I4891 Unspecified atrial fibrillation: Secondary | ICD-10-CM | POA: Diagnosis not present

## 2012-08-09 HISTORY — PX: TEE WITHOUT CARDIOVERSION: SHX5443

## 2012-08-09 HISTORY — PX: CARDIOVERSION: SHX1299

## 2012-08-09 LAB — BASIC METABOLIC PANEL
Calcium: 8.8 mg/dL (ref 8.4–10.5)
GFR calc Af Amer: 53 mL/min — ABNORMAL LOW (ref >90)
GFR calc non Af Amer: 46 mL/min — ABNORMAL LOW (ref >90)
Potassium: 3.5 mEq/L (ref 3.5–5.1)
Sodium: 141 mEq/L (ref 135–145)

## 2012-08-09 LAB — GLUCOSE, CAPILLARY: Glucose-Capillary: 139 mg/dL — ABNORMAL HIGH (ref 70–99)

## 2012-08-09 SURGERY — ECHOCARDIOGRAM, TRANSESOPHAGEAL
Anesthesia: Monitor Anesthesia Care

## 2012-08-09 MED ORDER — BUTAMBEN-TETRACAINE-BENZOCAINE 2-2-14 % EX AERO
INHALATION_SPRAY | CUTANEOUS | Status: DC | PRN
  Administered 2012-08-09: 1 via TOPICAL

## 2012-08-09 MED ORDER — MIDAZOLAM HCL 10 MG/2ML IJ SOLN
INTRAMUSCULAR | Status: DC | PRN
  Administered 2012-08-09 (×2): 1 mg via INTRAVENOUS

## 2012-08-09 MED ORDER — SODIUM CHLORIDE 0.9 % IJ SOLN: 3.0000 mL | INTRAMUSCULAR | Status: DC | PRN

## 2012-08-09 MED ORDER — SODIUM CHLORIDE 0.9 % IV SOLN
INTRAVENOUS | Status: DC
  Administered 2012-08-09: 20 mL/h via INTRAVENOUS
  Administered 2012-08-09: 500 mL via INTRAVENOUS

## 2012-08-09 MED ORDER — FENTANYL CITRATE 0.05 MG/ML IJ SOLN
INTRAMUSCULAR | Status: AC
  Filled 2012-08-09: qty 2

## 2012-08-09 MED ORDER — LIDOCAINE VISCOUS 2 % MT SOLN
OROMUCOSAL | Status: AC
  Filled 2012-08-09: qty 15

## 2012-08-09 MED ORDER — FENTANYL CITRATE 0.05 MG/ML IJ SOLN
INTRAMUSCULAR | Status: DC | PRN
  Administered 2012-08-09 (×2): 25 ug via INTRAVENOUS

## 2012-08-09 MED ORDER — SODIUM CHLORIDE 0.9 % IJ SOLN: 3.0000 mL | Freq: Two times a day (BID) | INTRAMUSCULAR | Status: DC

## 2012-08-09 MED ORDER — SODIUM CHLORIDE 0.9 % IV SOLN: INTRAVENOUS | Status: DC

## 2012-08-09 MED ORDER — SODIUM CHLORIDE 0.9 % IV SOLN: 250.0000 mL | INTRAVENOUS | Status: DC

## 2012-08-09 MED ORDER — LIDOCAINE VISCOUS 2 % MT SOLN
OROMUCOSAL | Status: DC | PRN
  Administered 2012-08-09: 1 via OROMUCOSAL

## 2012-08-09 MED ORDER — PROPOFOL 10 MG/ML IV BOLUS
INTRAVENOUS | Status: DC | PRN
  Administered 2012-08-09: 30 mg via INTRAVENOUS

## 2012-08-09 MED ORDER — SODIUM CHLORIDE 0.9 % IJ SOLN
3.0000 mL | Freq: Two times a day (BID) | INTRAMUSCULAR | Status: DC
  Administered 2012-08-09 – 2012-08-10 (×2): 3 mL via INTRAVENOUS

## 2012-08-09 MED ORDER — MIDAZOLAM HCL 5 MG/ML IJ SOLN
INTRAMUSCULAR | Status: AC
  Filled 2012-08-09: qty 2

## 2012-08-09 MED ORDER — AMIODARONE HCL 200 MG PO TABS
200.0000 mg | ORAL_TABLET | Freq: Two times a day (BID) | ORAL | Status: DC
  Administered 2012-08-09: 200 mg via ORAL
  Filled 2012-08-09 (×3): qty 1

## 2012-08-09 NOTE — Anesthesia Preprocedure Evaluation (Addendum)
Anesthesia Evaluation  Patient identified by MRN, date of birth, ID band Patient awake    Reviewed: Allergy & Precautions, H&P , NPO status , Patient's Chart, lab work & pertinent test results, reviewed documented beta blocker date and time   Airway Mallampati: I TM Distance: >3 FB Neck ROM: Full    Dental   Pulmonary  breath sounds clear to auscultation        Cardiovascular hypertension, + dysrhythmias Atrial Fibrillation + Valvular Problems/Murmurs MR Rhythm:Irregular Rate:Normal     Neuro/Psych    GI/Hepatic   Endo/Other    Renal/GU      Musculoskeletal   Abdominal   Peds  Hematology   Anesthesia Other Findings sedated  Reproductive/Obstetrics                          Anesthesia Physical Anesthesia Plan  ASA: III  Anesthesia Plan: General   Post-op Pain Management:    Induction:   Airway Management Planned:   Additional Equipment:   Intra-op Plan:   Post-operative Plan:   Informed Consent:   Plan Discussed with:   Anesthesia Plan Comments:         Anesthesia Quick Evaluation

## 2012-08-09 NOTE — CV Procedure (Signed)
Electrical Cardioversion Procedure Note Cynthia Wright [id number] 1924-07-11  Procedure: Electrical Cardioversion Indications:  Atrial Fibrillation  Time Out: Verified patient identification, verified procedure,medications/allergies/relevent history reviewed, required imaging and test results available.  Performed  Procedure Details  The patient was NPO after midnight. Anesthesia was administered at the beside  by Dr.Edwards with 30mg  of propofol.  Cardioversion was done with synchronized biphasic defibrillation with AP pads with 150watts.  The patient converted to normal sinus rhythm. The patient tolerated the procedure well   IMPRESSION:  Successful cardioversion of atrial fibrillation to NSR with PAC's and PVC's    Cynthia Wright R 08/09/2012, 1:33 PM

## 2012-08-09 NOTE — Interval H&P Note (Signed)
History and Physical Interval Note:  08/09/2012 1:00 PM  Cynthia Wright  has presented today for surgery, with the diagnosis of a- fib  The various methods of treatment have been discussed with the patient and family. After consideration of risks, benefits and other options for treatment, the patient has consented to  Procedure(s) with comments: TRANSESOPHAGEAL ECHOCARDIOGRAM (TEE) (N/A) - talk to angie in anes. CARDIOVERSION (N/A) as a surgical intervention .  The patient's history has been reviewed, patient examined, no change in status, stable for surgery.  I have reviewed the patient's chart and labs.  Questions were answered to the patient's satisfaction.     Waunita Sandstrom R

## 2012-08-09 NOTE — Transfer of Care (Signed)
Immediate Anesthesia Transfer of Care Note  Patient: Cynthia Wright  Procedure(s) Performed: Procedure(s) with comments: TRANSESOPHAGEAL ECHOCARDIOGRAM (TEE) (N/A) - talk to angie in anes. CARDIOVERSION (N/A)  Patient Location: Endoscopy Unit  Anesthesia Type:General  Level of Consciousness: awake, alert , oriented and patient cooperative  Airway & Oxygen Therapy: Patient Spontanous Breathing and Patient connected to nasal cannula oxygen  Post-op Assessment: Report given to PACU RN, Post -op Vital signs reviewed and stable and Patient moving all extremities  Post vital signs: Reviewed and stable  Complications: No apparent anesthesia complications

## 2012-08-09 NOTE — Progress Notes (Signed)
  Echocardiogram Echocardiogram Transesophageal has been performed.  Margreta Journey 08/09/2012, 2:08 PM

## 2012-08-09 NOTE — Progress Notes (Signed)
SUBJECTIVE:  HR still uncontrolled.  No complaints this am  OBJECTIVE:   Vitals:   Filed Vitals:   08/08/12 0448 08/08/12 1406 08/08/12 1934 08/09/12 0446  BP: 106/57 102/60 99/61 113/61  Pulse: 80 80 86 89  Temp: 97.8 F (36.6 C) 97.6 F (36.4 C) 97.6 F (36.4 C) 97.6 F (36.4 C)  TempSrc: Oral Oral Oral Oral  Resp: 18 18 18 18   Height:      Weight: 60.1 kg (132 lb 7.9 oz)   58.469 kg (128 lb 14.4 oz)  SpO2: 98% 100% 98% 97%   I&O's:   Intake/Output Summary (Last 24 hours) at 08/09/12 0724 Last data filed at 08/09/12 0300  Gross per 24 hour  Intake 1407.67 ml  Output   2000 ml  Net -592.33 ml   TELEMETRY: Reviewed telemetry pt in atrial fibrillation with RVR     PHYSICAL EXAM General: Well developed, well nourished, in no acute distress Head: Eyes PERRLA, No xanthomas.   Normal cephalic and atramatic  Lungs:   Clear bilaterally to auscultation and percussion. Heart:   Irregularly irregular tachy S1 S2 Pulses are 2+ & equal. Abdomen: Bowel sounds are positive, abdomen soft and non-tender without masses Extremities:   No clubbing, cyanosis or edema.  DP +1 Neuro: Alert and oriented X 3. Psych:  Good affect, responds appropriately   LABS: Basic Metabolic Panel:  Recent Labs  82/95/62 0505 08/09/12 0420  NA 141 141  K 3.4* 3.5  CL 101 101  CO2 29 32  GLUCOSE 130* 147*  BUN 15 16  CREATININE 1.00 1.07  CALCIUM 8.4 8.8   Coag Panel:   Lab Results  Component Value Date   INR 1.07 08/02/2012    RADIOLOGY: Dg Chest Portable 1 View  08/02/2012  *RADIOLOGY REPORT*  Clinical Data: Shortness of breath, presents with symptoms of atrial fibrillation  PORTABLE CHEST - 1 VIEW  Comparison: None.  Findings:  There is obscuration of the left heart border is secondary to a small to moderate-sized left-sided pleural effusion and associated left basilar heterogeneous / consolidative opacities.  Small right- sided effusion with associated right basilar heterogeneous opacities.   Lung volumes are slightly reduced. The pulmonary vasculature is indistinct with cephalization of flow.  No definite pneumothorax.  No definite acute osseous abnormalities.  IMPRESSION: Overall findings suggestive of pulmonary edema with moderate left and small right-sided effusion and associated bibasilar opacities, atelectasis versus infiltrate.  A follow-up chest radiograph in 4 to 6 weeks after treatment is recommended to ensure resolution.   Original Report Authenticated By: Tacey Ruiz, MD    ASSESSMENT:  1. Atrial fibrillation with poor but improving rate on amiodarone, beta blocker, and the recent addition of digoxin.  2. Acute systolic heart failure likely tachycardia induced . No clinical CHF at this time.  3. Anticoagulation with Apixaban   PLAN:   1.  NPO 2.  TEE/DCCV today - risks of procedure explained including risk of aspiration, esophageal perforation, MI, CVA, death, need for temp pacing after DCCV - patient understands and wished to proceed.  Quintella Reichert, MD  08/09/2012  7:24 AM

## 2012-08-09 NOTE — Anesthesia Postprocedure Evaluation (Signed)
  Anesthesia Post-op Note  Patient: Cynthia Wright  Procedure(s) Performed: Procedure(s) with comments: TRANSESOPHAGEAL ECHOCARDIOGRAM (TEE) (N/A) - talk to angie in anes. CARDIOVERSION (N/A)  Patient Location: Endoscopy Unit  Anesthesia Type:General  Level of Consciousness: awake, alert , oriented and patient cooperative  Airway and Oxygen Therapy: Patient Spontanous Breathing and Patient connected to nasal cannula oxygen  Post-op Pain: none  Post-op Assessment: Post-op Vital signs reviewed, Patient's Cardiovascular Status Stable, Respiratory Function Stable, Patent Airway and No signs of Nausea or vomiting  Post-op Vital Signs: Reviewed and stable  Complications: No apparent anesthesia complications

## 2012-08-09 NOTE — CV Procedure (Signed)
PROCEDURE NOTE:  Procedure:  Transesophageal echocardiogram Operator:  Armanda Magic, MD Complications:None IV Meds:Versed 2mg  IV and Fentanyl  Results:  Mildly reduced LVF EF 40-45% Normal RV and RA Normal MV with moderate MR  Normal TV with mild TR Normal PV with trivial PR Normal trileaflet AV with trivial AR Moderate LA enlargement with no evidence of thrombus in the LA or LA appendage.  There is some mild smoke in the LA and appendage. Normal thoracic and ascending Aorta  Patient tolerated procedure well without complication and went on to DCCV.

## 2012-08-10 ENCOUNTER — Encounter (HOSPITAL_COMMUNITY): Payer: Self-pay | Admitting: Cardiology

## 2012-08-10 DIAGNOSIS — I4891 Unspecified atrial fibrillation: Secondary | ICD-10-CM | POA: Diagnosis not present

## 2012-08-10 DIAGNOSIS — I10 Essential (primary) hypertension: Secondary | ICD-10-CM | POA: Diagnosis not present

## 2012-08-10 DIAGNOSIS — I509 Heart failure, unspecified: Secondary | ICD-10-CM | POA: Diagnosis not present

## 2012-08-10 LAB — BASIC METABOLIC PANEL
BUN: 14 mg/dL (ref 6–23)
CO2: 33 mEq/L — ABNORMAL HIGH (ref 19–32)
Chloride: 101 mEq/L (ref 96–112)
GFR calc non Af Amer: 49 mL/min — ABNORMAL LOW (ref >90)
Glucose, Bld: 91 mg/dL (ref 70–99)
Potassium: 3.6 mEq/L (ref 3.5–5.1)
Sodium: 143 mEq/L (ref 135–145)

## 2012-08-10 LAB — GLUCOSE, CAPILLARY
Glucose-Capillary: 110 mg/dL — ABNORMAL HIGH (ref 70–99)
Glucose-Capillary: 129 mg/dL — ABNORMAL HIGH (ref 70–99)

## 2012-08-10 MED ORDER — AMIODARONE HCL 200 MG PO TABS
200.0000 mg | ORAL_TABLET | Freq: Every day | ORAL | Status: DC
  Administered 2012-08-10: 200 mg via ORAL
  Filled 2012-08-10: qty 1

## 2012-08-10 MED ORDER — AMIODARONE HCL 200 MG PO TABS: 200.0000 mg | ORAL_TABLET | Freq: Every day | ORAL | Status: DC

## 2012-08-10 MED ORDER — FUROSEMIDE 40 MG PO TABS: 40.0000 mg | ORAL_TABLET | Freq: Two times a day (BID) | ORAL | Status: DC

## 2012-08-10 MED ORDER — POTASSIUM CHLORIDE CRYS ER 20 MEQ PO TBCR: 20.0000 meq | EXTENDED_RELEASE_TABLET | Freq: Two times a day (BID) | ORAL | Status: DC

## 2012-08-10 MED ORDER — CARVEDILOL 3.125 MG PO TABS: 3.1250 mg | ORAL_TABLET | Freq: Two times a day (BID) | ORAL | Status: DC

## 2012-08-10 MED ORDER — LISINOPRIL 5 MG PO TABS: 5.0000 mg | ORAL_TABLET | Freq: Every day | ORAL | Status: DC

## 2012-08-10 MED ORDER — APIXABAN 2.5 MG PO TABS: 2.5000 mg | ORAL_TABLET | Freq: Two times a day (BID) | ORAL | Status: DC

## 2012-08-10 MED ORDER — CARVEDILOL 3.125 MG PO TABS
3.1250 mg | ORAL_TABLET | Freq: Two times a day (BID) | ORAL | Status: DC
  Administered 2012-08-10: 3.125 mg via ORAL
  Filled 2012-08-10 (×2): qty 1

## 2012-08-10 NOTE — Anesthesia Postprocedure Evaluation (Signed)
  Anesthesia Post-op Note  Patient: Cynthia Wright  Procedure(s) Performed: Procedure(s) with comments: TRANSESOPHAGEAL ECHOCARDIOGRAM (TEE) (N/A) - talk to angie in anes. CARDIOVERSION (N/A)  Patient Location: PACU and Endoscopy Unit  Anesthesia Type:General  Level of Consciousness: awake  Airway and Oxygen Therapy: Patient Spontanous Breathing  Post-op Pain: none  Post-op Assessment: Post-op Vital signs reviewed, Patient's Cardiovascular Status Stable, Respiratory Function Stable, Patent Airway, No signs of Nausea or vomiting and Pain level controlled  Post-op Vital Signs: stable  Complications: No apparent anesthesia complications

## 2012-08-10 NOTE — Progress Notes (Signed)
SUBJECTIVE:  No complaints  OBJECTIVE:   Vitals:   Filed Vitals:   08/09/12 1440 08/09/12 1500 08/09/12 1945 08/10/12 0557  BP: 116/49 108/42 118/44 142/48  Pulse:  58 59 64  Temp:  97.4 F (36.3 C) 97.8 F (36.6 C) 97.8 F (36.6 C)  TempSrc:  Oral Oral Oral  Resp: 38 20 21 18   Height:      Weight:    60 kg (132 lb 4.4 oz)  SpO2: 90% 98% 98% 94%   I&O's:   Intake/Output Summary (Last 24 hours) at 08/10/12 0744 Last data filed at 08/10/12 0600  Gross per 24 hour  Intake    600 ml  Output      0 ml  Net    600 ml   TELEMETRY: Reviewed telemetry pt in NSR:     PHYSICAL EXAM General: Well developed, well nourished, in no acute distress Head: Eyes PERRLA, No xanthomas.   Normal cephalic and atramatic  Lungs:   Clear bilaterally to auscultation and percussion. Heart:   HRRR S1 S2 Pulses are 2+ & equal. Abdomen: Bowel sounds are positive, abdomen soft and non-tender without masses Extremities:   No clubbing, cyanosis or edema.  DP +1 Neuro: Alert and oriented X 3. Psych:  Good affect, responds appropriately   LABS: Basic Metabolic Panel:  Recent Labs  16/10/96 0505 08/09/12 0420  NA 141 141  K 3.4* 3.5  CL 101 101  CO2 29 32  GLUCOSE 130* 147*  BUN 15 16  CREATININE 1.00 1.07  CALCIUM 8.4 8.8   Coag Panel:   Lab Results  Component Value Date   INR 1.07 08/02/2012    RADIOLOGY: Dg Chest Portable 1 View  08/02/2012  *RADIOLOGY REPORT*  Clinical Data: Shortness of breath, presents with symptoms of atrial fibrillation  PORTABLE CHEST - 1 VIEW  Comparison: None.  Findings:  There is obscuration of the left heart border is secondary to a small to moderate-sized left-sided pleural effusion and associated left basilar heterogeneous / consolidative opacities.  Small right- sided effusion with associated right basilar heterogeneous opacities.  Lung volumes are slightly reduced. The pulmonary vasculature is indistinct with cephalization of flow.  No definite pneumothorax.   No definite acute osseous abnormalities.  IMPRESSION: Overall findings suggestive of pulmonary edema with moderate left and small right-sided effusion and associated bibasilar opacities, atelectasis versus infiltrate.  A follow-up chest radiograph in 4 to 6 weeks after treatment is recommended to ensure resolution.   Original Report Authenticated By: Tacey Ruiz, MD    ASSESSMENT:  1. Atrial fibrillation with RVR s/p TEE/DCCV to NSR 2. Acute systolic heart failure likely tachycardia induced . No clinical CHF at this time.  3. Anticoagulation with Apixaban  PLAN: 1. Decrease Amiodarone to 200mg  daily 2. Lopressor stopped due to bradycardia which has improved.  Will d/c digoxin and start Coreg 3.125mg  BID for LV dysfunction  3.  Continue ACE I/Lasix 4.  D/C home later today if HR ok       Quintella Reichert, MD  08/10/2012  7:44 AM

## 2012-08-10 NOTE — Progress Notes (Signed)
Reviewed discharge with patient and her daughter in law, answered questions and voiced understanding. Dc home with instructions and belongings Cynthia Wright

## 2012-08-13 NOTE — Discharge Summary (Signed)
Patient ID: Cynthia Wright MRN: [id number] DOB/AGE: 1924-11-25 79 y.o.  Admit date: 08/02/2012 Discharge date: 08/13/2012  Primary Discharge Diagnosis  Atrial fibrillation with RVR Secondary Discharge Diagnosis  Tachycardia induced dilated cardiomyopathy  Acute systolic CHF  Systemic anticoagulation  HTN  DM  Dyslipidemia    Significant Diagnostic Studies: cardiac graphics: Echocardiogram:*[place]* ------------------------------------------------------------ Study Conclusions  - Left ventricle: No evidence of thrombus. - Left atrium: No evidence of thrombus in the atrial cavity or appendage. Impressions:  - No cardiac source of emboli was indentified. Successful cardioversion. Recommendations: Continue anticoagulation. Transesophageal echocardiography with cardioversion. 2D and intravenous contrast injection. Height: Height: 165.1cm. Height: 65in. Weight: Weight: 58.2kg. Weight: 128lb. Body mass index: BMI: 21.3kg/m^2. Body surface area: BSA: 1.52m^2. Blood pressure: 113/61. Patient status: Inpatient. Location: Endoscopy.  ------------------------------------------------------------  ------------------------------------------------------------ Left ventricle: No evidence of thrombus. Mildly reduced LVF EF 40-45% Normal RV and RA. Normal MV with moderate MR. Normal TV with mild TR. Normal PV with trivialPR. Normal trileaflet AV with trivial AR.  ------------------------------------------------------------ Left atrium: No evidence of thrombus in the atrial cavity or appendage. Moderate LA enlargement with a small amount of spontaneous echo contrast but no evidence of thrombus in the LA or LAA.  ------------------------------------------------------------ Prepared and Electronically Authenticated by  Armanda Magic, MD 2014-05-12T15:34:[id number]   Consults: None  Hospital Course: 79 year old female with previously diagnosed heart arrhythmia who used to see a  cardiologist in [place] but recently moved up here to be with her 2 children who have chronic illnesses including kidney transplant, open heart surgery who is here in the emergency department with rapid atrial fibrillation. Over the past month or so she states that she's been becoming weaker and over the last week she has felt some shortness of breath. She states that she has had a mild cough but she attributes this to the pollen in the air. She denies any recent fevers, chills, bleeding episodes, strokelike symptoms. She states that her son is a Development worker, community. She does not recall ever being spoken to about Coumadin. She does not recall being told she has atrial fibrillation. She was however on digoxin which was discontinue because of bradycardia. She remained on diltiazem 180 mg once a day. She denies any orthopnea but she states she has had mild shortness of breath with activity and feels somewhat drained. An echocardiogram 04/01/12 was performed which demonstrated normal ejection fraction with mild TR. Liver cysts were noted and are being followed by primary physician.  She was admitted to the hospital with afib with RVR and started on IV Cardizem but this did not control her HR evan at a gtt of 20mg /hr and it was stopped. She was placed on Metoprolol which was titrated to 100mg  BID and HR was still not conrtolled.  She was placed on IV Amiodarone gtt and digoxin was added.   A 2D echo was obtained showing moderate to severe LV dysfunction with EF 35% felt secondary to tachycardia induced CM.  She was started on Eliquis.  SHe was diuresed with IV Lasix.  Despite aggressive medical therapy her HR could l=not be controlled and she underwent TEE showing no evidence of left atrial or atrail appedage thrombus and she underwent cardioversion to NSR.  Her HR maintained around 60bpm so her digoxin was stopped and her lopressor was changed to Coreg for LV dysfunction.  She was switched to PO Amio and did well.  On the day of  discharge she was ambulating without SOB and remained in NSR.  SHe was discharged to  home in stable condition.       Discharge Exam: Blood pressure 119/40, pulse 63, temperature 97.8 F (36.6 C), temperature source Oral, resp. rate 18, height 5\' 5"  (1.651 m), weight 60 kg (132 lb 4.4 oz), SpO2 98.00%.   General appearance: alert Resp: clear to auscultation bilaterally Cardio: regular rate and rhythm, S1, S2 normal, no murmur, click, rub or gallop GI: soft, non-tender; bowel sounds normal; no masses,  no organomegaly Extremities: extremities normal, atraumatic, no cyanosis or edema Labs:   Lab Results  Component Value Date   WBC 8.4 08/03/2012   HGB 12.4 08/03/2012   HCT 36.2 08/03/2012   MCV 96.8 08/03/2012   PLT 206 08/03/2012    Recent Labs Lab 08/10/12 0520  NA 143  K 3.6  CL 101  CO2 33*  BUN 14  CREATININE 1.01  CALCIUM 8.8  GLUCOSE 91   Lab Results  Component Value Date   TROPONINI <0.30 08/03/2012        Radiology  *RADIOLOGY REPORT*  Clinical Data: Shortness of breath, presents with symptoms of  atrial fibrillation  PORTABLE CHEST - 1 VIEW  Comparison: None.  Findings:  There is obscuration of the left heart border is secondary to a  small to moderate-sized left-sided pleural effusion and associated  left basilar heterogeneous / consolidative opacities. Small right-  sided effusion with associated right basilar heterogeneous  opacities. Lung volumes are slightly reduced. The pulmonary  vasculature is indistinct with cephalization of flow. No definite  pneumothorax. No definite acute osseous abnormalities.  IMPRESSION:  Overall findings suggestive of pulmonary edema with moderate left  and small right-sided effusion and associated bibasilar opacities,  atelectasis versus infiltrate. A follow-up chest radiograph in 4  to 6 weeks after treatment is recommended to ensure resolution.  Original Report Authenticated By: Tacey Ruiz, MD  EKG:  Sinus bradycardia at  54bpm with nonspecific ST abnormality  FOLLOW UP PLANS AND APPOINTMENTS Discharge Orders   Future Orders Complete By Expires     Diet - low sodium heart healthy  As directed     Increase activity slowly  As directed         Medication List    STOP taking these medications       aspirin EC 325 MG tablet     diltiazem 180 MG 24 hr capsule  Commonly known as:  TIAZAC     ramipril 5 MG capsule  Commonly known as:  ALTACE      TAKE these medications       amiodarone 200 MG tablet  Commonly known as:  PACERONE  Take 1 tablet (200 mg total) by mouth daily.     apixaban 2.5 MG Tabs tablet  Commonly known as:  ELIQUIS  Take 1 tablet (2.5 mg total) by mouth 2 (two) times daily.     carvedilol 3.125 MG tablet  Commonly known as:  COREG  Take 1 tablet (3.125 mg total) by mouth 2 (two) times daily with a meal.     CRESTOR 5 MG tablet  Generic drug:  rosuvastatin  Take 5 mg by mouth daily.     furosemide 40 MG tablet  Commonly known as:  LASIX  Take 1 tablet (40 mg total) by mouth 2 (two) times daily.     lisinopril 5 MG tablet  Commonly known as:  PRINIVIL,ZESTRIL  Take 1 tablet (5 mg total) by mouth daily.     multivitamin with minerals Tabs  Take 1 tablet by mouth  daily.     OVER THE COUNTER MEDICATION  Take 200 mg by mouth at bedtime. Take two 100mg  stool softener capsules before bedtime     pioglitazone 45 MG tablet  Commonly known as:  ACTOS  Take 45 mg by mouth daily.     potassium chloride SA 20 MEQ tablet  Commonly known as:  K-DUR,KLOR-CON  Take 1 tablet (20 mEq total) by mouth 2 (two) times daily.     PROBIOTIC DAILY PO  Take 1 capsule by mouth daily.     zolpidem 5 MG tablet  Commonly known as:  AMBIEN  Take 5 mg by mouth at bedtime as needed for sleep.           Follow-up Information   Follow up with Quintella Reichert, MD On 08/18/2012. (at 10am)    Contact information:   [address] [place] [address] [phone]        BRING ALL MEDICATIONS WITH YOU TO FOLLOW UP APPOINTMENTS  Time spent with patient to include physician time: 35 minutes Signed: Sherron Mapp R 08/13/2012, 8:01 AM

## 2012-08-18 DIAGNOSIS — I4891 Unspecified atrial fibrillation: Secondary | ICD-10-CM | POA: Diagnosis not present

## 2012-08-18 DIAGNOSIS — I5022 Chronic systolic (congestive) heart failure: Secondary | ICD-10-CM | POA: Diagnosis not present

## 2012-08-18 DIAGNOSIS — Z79899 Other long term (current) drug therapy: Secondary | ICD-10-CM | POA: Diagnosis not present

## 2012-08-18 DIAGNOSIS — I428 Other cardiomyopathies: Secondary | ICD-10-CM | POA: Diagnosis not present

## 2012-08-18 DIAGNOSIS — I10 Essential (primary) hypertension: Secondary | ICD-10-CM | POA: Diagnosis not present

## 2012-08-26 DIAGNOSIS — I5022 Chronic systolic (congestive) heart failure: Secondary | ICD-10-CM | POA: Diagnosis not present

## 2012-08-26 DIAGNOSIS — I428 Other cardiomyopathies: Secondary | ICD-10-CM | POA: Diagnosis not present

## 2012-08-26 DIAGNOSIS — I4891 Unspecified atrial fibrillation: Secondary | ICD-10-CM | POA: Diagnosis not present

## 2012-08-26 DIAGNOSIS — Z79899 Other long term (current) drug therapy: Secondary | ICD-10-CM | POA: Diagnosis not present

## 2012-08-26 DIAGNOSIS — I10 Essential (primary) hypertension: Secondary | ICD-10-CM | POA: Diagnosis not present

## 2012-09-06 DIAGNOSIS — K649 Unspecified hemorrhoids: Secondary | ICD-10-CM | POA: Diagnosis not present

## 2012-09-06 DIAGNOSIS — E119 Type 2 diabetes mellitus without complications: Secondary | ICD-10-CM | POA: Diagnosis not present

## 2012-09-27 DIAGNOSIS — E119 Type 2 diabetes mellitus without complications: Secondary | ICD-10-CM | POA: Diagnosis not present

## 2012-10-20 DIAGNOSIS — R55 Syncope and collapse: Secondary | ICD-10-CM | POA: Diagnosis not present

## 2012-10-20 DIAGNOSIS — I10 Essential (primary) hypertension: Secondary | ICD-10-CM | POA: Diagnosis not present

## 2012-10-20 DIAGNOSIS — E119 Type 2 diabetes mellitus without complications: Secondary | ICD-10-CM | POA: Diagnosis not present

## 2012-10-28 ENCOUNTER — Other Ambulatory Visit: Payer: Self-pay | Admitting: Family Medicine

## 2012-11-08 DIAGNOSIS — E878 Other disorders of electrolyte and fluid balance, not elsewhere classified: Secondary | ICD-10-CM | POA: Diagnosis not present

## 2012-11-08 DIAGNOSIS — D649 Anemia, unspecified: Secondary | ICD-10-CM | POA: Diagnosis not present

## 2012-11-08 DIAGNOSIS — Z1211 Encounter for screening for malignant neoplasm of colon: Secondary | ICD-10-CM | POA: Diagnosis not present

## 2012-11-15 DIAGNOSIS — I428 Other cardiomyopathies: Secondary | ICD-10-CM | POA: Diagnosis not present

## 2012-11-15 DIAGNOSIS — I10 Essential (primary) hypertension: Secondary | ICD-10-CM | POA: Diagnosis not present

## 2012-11-15 DIAGNOSIS — I5022 Chronic systolic (congestive) heart failure: Secondary | ICD-10-CM | POA: Diagnosis not present

## 2012-11-15 DIAGNOSIS — I4891 Unspecified atrial fibrillation: Secondary | ICD-10-CM | POA: Diagnosis not present

## 2012-11-17 DIAGNOSIS — I4891 Unspecified atrial fibrillation: Secondary | ICD-10-CM | POA: Diagnosis not present

## 2012-11-17 DIAGNOSIS — I10 Essential (primary) hypertension: Secondary | ICD-10-CM | POA: Diagnosis not present

## 2012-11-17 DIAGNOSIS — I5022 Chronic systolic (congestive) heart failure: Secondary | ICD-10-CM | POA: Diagnosis not present

## 2012-11-17 DIAGNOSIS — I428 Other cardiomyopathies: Secondary | ICD-10-CM | POA: Diagnosis not present

## 2012-11-25 ENCOUNTER — Other Ambulatory Visit: Payer: Self-pay | Admitting: Family Medicine

## 2012-11-25 DIAGNOSIS — Q446 Cystic disease of liver: Secondary | ICD-10-CM

## 2012-11-30 DIAGNOSIS — K7689 Other specified diseases of liver: Secondary | ICD-10-CM | POA: Diagnosis not present

## 2012-11-30 DIAGNOSIS — I4891 Unspecified atrial fibrillation: Secondary | ICD-10-CM | POA: Diagnosis not present

## 2012-11-30 DIAGNOSIS — I10 Essential (primary) hypertension: Secondary | ICD-10-CM | POA: Diagnosis not present

## 2012-11-30 DIAGNOSIS — G4700 Insomnia, unspecified: Secondary | ICD-10-CM | POA: Diagnosis not present

## 2012-11-30 DIAGNOSIS — R29818 Other symptoms and signs involving the nervous system: Secondary | ICD-10-CM | POA: Diagnosis not present

## 2012-11-30 DIAGNOSIS — E119 Type 2 diabetes mellitus without complications: Secondary | ICD-10-CM | POA: Diagnosis not present

## 2012-12-03 ENCOUNTER — Ambulatory Visit: Payer: Medicare Other | Admitting: Rehabilitative and Restorative Service Providers"

## 2012-12-07 ENCOUNTER — Ambulatory Visit: Payer: Medicare Other | Admitting: Physical Therapy

## 2012-12-13 ENCOUNTER — Other Ambulatory Visit: Payer: Medicare Other

## 2012-12-16 ENCOUNTER — Ambulatory Visit
Admission: RE | Admit: 2012-12-16 | Discharge: 2012-12-16 | Disposition: A | Discharge status: Home / Self Care | Payer: Medicare Other | Source: Ambulatory Visit | Attending: Family Medicine | Admitting: Family Medicine

## 2012-12-16 DIAGNOSIS — Q446 Cystic disease of liver: Secondary | ICD-10-CM

## 2012-12-16 DIAGNOSIS — K7689 Other specified diseases of liver: Secondary | ICD-10-CM | POA: Diagnosis not present

## 2012-12-16 DIAGNOSIS — D1803 Hemangioma of intra-abdominal structures: Secondary | ICD-10-CM | POA: Diagnosis not present

## 2012-12-28 ENCOUNTER — Encounter (HOSPITAL_COMMUNITY): Payer: Self-pay | Admitting: *Deleted

## 2012-12-28 ENCOUNTER — Emergency Department (HOSPITAL_COMMUNITY)
Admission: EM | Admit: 2012-12-28 | Discharge: 2012-12-28 | Disposition: A | Discharge status: Home / Self Care | Payer: Medicare Other | Attending: Emergency Medicine | Admitting: Emergency Medicine

## 2012-12-28 ENCOUNTER — Emergency Department (HOSPITAL_COMMUNITY): Payer: Medicare Other

## 2012-12-28 DIAGNOSIS — I10 Essential (primary) hypertension: Secondary | ICD-10-CM | POA: Diagnosis not present

## 2012-12-28 DIAGNOSIS — Z79899 Other long term (current) drug therapy: Secondary | ICD-10-CM | POA: Diagnosis not present

## 2012-12-28 DIAGNOSIS — R001 Bradycardia, unspecified: Secondary | ICD-10-CM

## 2012-12-28 DIAGNOSIS — E119 Type 2 diabetes mellitus without complications: Secondary | ICD-10-CM | POA: Diagnosis not present

## 2012-12-28 DIAGNOSIS — R42 Dizziness and giddiness: Secondary | ICD-10-CM

## 2012-12-28 DIAGNOSIS — I4891 Unspecified atrial fibrillation: Secondary | ICD-10-CM | POA: Insufficient documentation

## 2012-12-28 DIAGNOSIS — R0789 Other chest pain: Secondary | ICD-10-CM | POA: Diagnosis not present

## 2012-12-28 DIAGNOSIS — R079 Chest pain, unspecified: Secondary | ICD-10-CM | POA: Insufficient documentation

## 2012-12-28 DIAGNOSIS — E785 Hyperlipidemia, unspecified: Secondary | ICD-10-CM | POA: Diagnosis not present

## 2012-12-28 DIAGNOSIS — I498 Other specified cardiac arrhythmias: Secondary | ICD-10-CM | POA: Diagnosis not present

## 2012-12-28 DIAGNOSIS — Z7902 Long term (current) use of antithrombotics/antiplatelets: Secondary | ICD-10-CM | POA: Insufficient documentation

## 2012-12-28 HISTORY — DX: Unspecified atrial fibrillation: I48.91

## 2012-12-28 LAB — CBC WITH DIFFERENTIAL/PLATELET
Basophils Absolute: 0 10*3/uL (ref 0.0–0.1)
Basophils Relative: 0 % (ref 0–1)
Eosinophils Absolute: 0 10*3/uL (ref 0.0–0.7)
HCT: 37.2 % (ref 36.0–46.0)
Hemoglobin: 12.7 g/dL (ref 12.0–15.0)
MCH: 34.4 pg — ABNORMAL HIGH (ref 26.0–34.0)
MCHC: 34.1 g/dL (ref 30.0–36.0)
Monocytes Absolute: 0.5 10*3/uL (ref 0.1–1.0)
Monocytes Relative: 9 % (ref 3–12)
Neutrophils Relative %: 64 % (ref 43–77)
RDW: 12.6 % (ref 11.5–15.5)

## 2012-12-28 LAB — POCT I-STAT TROPONIN I: Troponin i, poc: 0.01 ng/mL (ref 0.00–0.08)

## 2012-12-28 LAB — COMPREHENSIVE METABOLIC PANEL
AST: 20 U/L (ref 0–37)
Albumin: 3.6 g/dL (ref 3.5–5.2)
BUN: 20 mg/dL (ref 6–23)
Creatinine, Ser: 1.11 mg/dL — ABNORMAL HIGH (ref 0.50–1.10)
Total Protein: 6.9 g/dL (ref 6.0–8.3)

## 2012-12-28 LAB — GLUCOSE, CAPILLARY: Glucose-Capillary: 105 mg/dL — ABNORMAL HIGH (ref 70–99)

## 2012-12-28 NOTE — ED Provider Notes (Signed)
CSN: [id number]     Arrival date & time 12/28/12  1435 History   First MD Initiated Contact with Patient 12/28/12 1522     Chief Complaint  Patient presents with  . Chest Pain  . Dizziness   (Consider location/radiation/quality/duration/timing/severity/associated sxs/prior Treatment) HPI Patient reports she and her brothers drove to [place] 4 days ago. Patient states her brothers drove and she was a passenger. They drove back yesterday evening. They report it is an 9 hour drive.  She states she's been having dizziness off and on for a few months. She states she started "staggering" starting one to 2 weeks ago. She states it doesn't happen every time she stands up or if she's been standing for a long period of time. She feels like she is going to lose her balance. She also has some spinning sensation that will last off and on for a few seconds not associated with the staggering. She had a headache last night in the right side of her head in the temporal region but it is gone now. She denies any numbness or tingling in her extremities. Patient is right-handed and has no difficulty dressing herself for using her arm. She states she had some chest tightness earlier today, she cannot clarify when it happened except as stated mid morning. It was in the center of her chest and only lasted a few minutes. She denies shortness of breath, sweatiness, nausea or vomiting with the pain. She states she just feels very tired today and was stumbling more than usual. Pt states prior to having a fib about 8 months ago she was only on diabetes medications and feels she is on too much medication.    PCP Dr Leitha Schuller Cardiologist Dr Mayford Knife  Past Medical History  Diagnosis Date  . Hypertension   . Diabetes mellitus without complication   . Hyperlipidemia   . Atrial fibrillation    Past Surgical History  Procedure Laterality Date  . Back surgery    . Tee without cardioversion N/A 08/09/2012    Procedure:  TRANSESOPHAGEAL ECHOCARDIOGRAM (TEE);  Surgeon: Quintella Reichert, MD;  Location: [facility] ENDOSCOPY;  Service: Cardiovascular;  Laterality: N/A;  talk to angie in [place].  . Cardioversion N/A 08/09/2012    Procedure: CARDIOVERSION;  Surgeon: Quintella Reichert, MD;  Location: [facility] ENDOSCOPY;  Service: Cardiovascular;  Laterality: N/A;   No family history on file. History  Substance Use Topics  . Smoking status: Never Smoker   . Smokeless tobacco: Not on file  . Alcohol Use: Yes   Lives at home Lives alone  OB History   Grav Para Term Preterm Abortions TAB SAB Ect Mult Living                 Review of Systems  All other systems reviewed and are negative.    Allergies  Codeine and Lortab  Home Medications   Patient's Medications  New Prescriptions   No medications on file  Previous Medications   AMIODARONE (PACERONE) 200 MG TABLET    Take 1 tablet (200 mg total) by mouth daily.   APIXABAN (ELIQUIS) 2.5 MG TABS TABLET    Take 1 tablet (2.5 mg total) by mouth 2 (two) times daily.   CARVEDILOL (COREG) 3.125 MG TABLET    Take 1 tablet (3.125 mg total) by mouth 2 (two) times daily with a meal.   FUROSEMIDE (LASIX) 40 MG TABLET    Take 40 mg by mouth daily.   LISINOPRIL (PRINIVIL,ZESTRIL) 5 MG  TABLET    Take 1 tablet (5 mg total) by mouth daily.   MULTIPLE VITAMIN (MULTIVITAMIN WITH MINERALS) TABS    Take 1 tablet by mouth daily.   OVER THE COUNTER MEDICATION    Take 200 mg by mouth at bedtime. Take 2 stool softener capsules before bedtime   OVER THE COUNTER MEDICATION    Place 3 drops into both eyes daily as needed (burning sensation in eyes). Ensure rewetting drops   POTASSIUM CHLORIDE SA (K-DUR,KLOR-CON) 20 MEQ TABLET    Take 1 tablet (20 mEq total) by mouth 2 (two) times daily.   PROBIOTIC PRODUCT (PROBIOTIC DAILY PO)    Take 2 capsules by mouth daily.    ROSUVASTATIN (CRESTOR) 5 MG TABLET    Take 5 mg by mouth daily.   SITAGLIPTIN (JANUVIA) 100 MG TABLET    Take 100 mg by mouth daily.    ZOLPIDEM (AMBIEN) 5 MG TABLET    Take 5 mg by mouth at bedtime as needed for sleep.  Modified Medications   No medications on file  Discontinued Medications   FUROSEMIDE (LASIX) 40 MG TABLET    Take 1 tablet (40 mg total) by mouth 2 (two) times daily.   PIOGLITAZONE (ACTOS) 45 MG TABLET    Take 45 mg by mouth daily.     BP 168/53  Pulse 56  Temp(Src) 98 F (36.7 C) (Oral)  Resp 17  SpO2 97%  Vital signs normal except bradycardia  Physical Exam  Nursing note and vitals reviewed. Constitutional: She is oriented to person, place, and time. She appears well-developed and well-nourished.  Non-toxic appearance. She does not appear ill. No distress.  HENT:  Head: Normocephalic and atraumatic.  Right Ear: External ear normal.  Left Ear: External ear normal.  Nose: Nose normal. No mucosal edema or rhinorrhea.  Mouth/Throat: Oropharynx is clear and moist and mucous membranes are normal. No dental abscesses or edematous.  Eyes: Conjunctivae and EOM are normal. Pupils are equal, round, and reactive to light.  Neck: Normal range of motion and full passive range of motion without pain. Neck supple.  Cardiovascular: Regular rhythm and normal heart sounds.  Bradycardia present.  Exam reveals no gallop and no friction rub.   No murmur heard. Pulmonary/Chest: Effort normal and breath sounds normal. No respiratory distress. She has no wheezes. She has no rhonchi. She has no rales. She exhibits no tenderness and no crepitus.  Abdominal: Soft. Normal appearance and bowel sounds are normal. She exhibits no distension. There is no tenderness. There is no rebound and no guarding.  Musculoskeletal: Normal range of motion. She exhibits no edema and no tenderness.  Moves all extremities well.   Neurological: She is alert and oriented to person, place, and time. She has normal strength. No cranial nerve deficit.  Skin: Skin is warm, dry and intact. No rash noted. No erythema. There is pallor.  Psychiatric:  She has a normal mood and affect. Her speech is normal and behavior is normal. Her mood appears not anxious.    ED Course  Procedures (including critical care time)  Orthostatic vital signs were done. Her blood pressure went from 158/53 lying to 168/53 standing, her heart rate went from 56 lying to 56 standing.  Patient was ambulated around the nursing office and her pulse ox was 97% during ambulation.  Pt feels able to go home, is going to discuss her medication regimen with her doctors. She is noted to have bradycardia and at her age may need a  higher heart rate to feel better. She is also questioning her Remus Loffler as a cause of her dizziness.   Labs Review Results for orders placed during the hospital encounter of 12/28/12  CBC WITH DIFFERENTIAL      Result Value Range   WBC 6.0  4.0 - 10.5 K/uL   RBC 3.69 (*) 3.87 - 5.11 MIL/uL   Hemoglobin 12.7  12.0 - 15.0 g/dL   HCT 16.1  09.6 - 04.5 %   MCV 100.8 (*) 78.0 - 100.0 fL   MCH 34.4 (*) 26.0 - 34.0 pg   MCHC 34.1  30.0 - 36.0 g/dL   RDW 40.9  81.1 - 91.4 %   Platelets 180  150 - 400 K/uL   Neutrophils Relative % 64  43 - 77 %   Neutro Abs 3.8  1.7 - 7.7 K/uL   Lymphocytes Relative 26  12 - 46 %   Lymphs Abs 1.6  0.7 - 4.0 K/uL   Monocytes Relative 9  3 - 12 %   Monocytes Absolute 0.5  0.1 - 1.0 K/uL   Eosinophils Relative 1  0 - 5 %   Eosinophils Absolute 0.0  0.0 - 0.7 K/uL   Basophils Relative 0  0 - 1 %   Basophils Absolute 0.0  0.0 - 0.1 K/uL  COMPREHENSIVE METABOLIC PANEL      Result Value Range   Sodium 141  135 - 145 mEq/L   Potassium 4.0  3.5 - 5.1 mEq/L   Chloride 104  96 - 112 mEq/L   CO2 27  19 - 32 mEq/L   Glucose, Bld 167 (*) 70 - 99 mg/dL   BUN 20  6 - 23 mg/dL   Creatinine, Ser 7.82 (*) 0.50 - 1.10 mg/dL   Calcium 9.1  8.4 - 95.6 mg/dL   Total Protein 6.9  6.0 - 8.3 g/dL   Albumin 3.6  3.5 - 5.2 g/dL   AST 20  0 - 37 U/L   ALT 16  0 - 35 U/L   Alkaline Phosphatase 63  39 - 117 U/L   Total Bilirubin 0.3   0.3 - 1.2 mg/dL   GFR calc non Af Amer 44 (*) >90 mL/min   GFR calc Af Amer 51 (*) >90 mL/min  GLUCOSE, CAPILLARY      Result Value Range   Glucose-Capillary 105 (*) 70 - 99 mg/dL  POCT I-STAT TROPONIN I      Result Value Range   Troponin i, poc 0.01  0.00 - 0.08 ng/mL   Comment 3            Vital signs normal except hyperglycemia   Imaging Review Dg Chest 2 View  12/28/2012   CLINICAL DATA:  Chest tightness, dizziness  EXAM: CHEST  2 VIEW  COMPARISON:  08/02/2012  FINDINGS: Chronic interstitial markings/ emphysematous changes. No pleural effusion or pneumothorax.  Heart is normal in size.  Visualized osseous structures are within normal limits.  IMPRESSION: No evidence of acute cardiopulmonary disease.   Electronically Signed   By: Charline Bills M.D.   On: 12/28/2012 15:09    Date: 12/28/2012  Rate: 58  Rhythm: sinus bradycardia  QRS Axis: normal  Intervals: normal  ST/T Wave abnormalities: normal  Conduction Disutrbances:none  Narrative Interpretation: Q waves lateral/septal leads  Old EKG Reviewed: none available    MDM   1. Bradycardia   2. Dizziness     Plan discharge  Devoria Albe, MD, Letitia Libra  Hildred Laser, MD 12/28/12 1723

## 2012-12-28 NOTE — ED Notes (Signed)
Pt states chest pain to center and not radiating.  No sob.  Pt reports dizziness for greater than 3 weeks consistently but worse today..  No unilateral weakness

## 2012-12-28 NOTE — ED Notes (Signed)
Pt. CBG was 105,and  02 was 97 while walking.

## 2012-12-28 NOTE — ED Notes (Signed)
Pt discharged.Vital signs stable and GCS 15.Advised pt to follow up with cardiologist as soon as possible.

## 2012-12-29 ENCOUNTER — Telehealth: Payer: Self-pay | Admitting: Cardiology

## 2012-12-29 NOTE — Telephone Encounter (Signed)
Spoke with patient who states she was in the ER yesterday for dizziness and feeling like her equilibrium was off and was told at discharge that she has bradycardia and to call her cardiologist. Patient states today she feels weak, dizzy, and feels like her equilibrium is off which makes her feel like she will stumble.  I advised patient that Dr. Mayford Knife is in the office and that I will talk with her and call the patient back.  Patient states she has an emergent dentist appointment in a few minutes but would be home after that.  I advised patient not to drive herself to the dentist and patient states she will get her next door neighbor to take her.  Patient verbalized understanding and agreement with plan.

## 2012-12-29 NOTE — Telephone Encounter (Signed)
Spoke with patient and advised her of Dr. Norris Cross instructions to hold Carvedilol and to see Dr. Mayford Knife tomorrow (10/2) at 2:45.  I spelled out the name of the medication for the patient and emphasized that she not take any more of this until she sees Dr. Mayford Knife tomorrow; patient verbalized understanding.  Patient verbalized understanding of appointment tomorrow and agreement with plan.

## 2012-12-29 NOTE — Telephone Encounter (Signed)
New problem   Pt need nurse to call her b/c she went to er yesterday and need to talk to nurse

## 2012-12-30 ENCOUNTER — Ambulatory Visit: Payer: Medicare Other | Admitting: Cardiology

## 2013-01-03 NOTE — Telephone Encounter (Signed)
New problem   Pt need to speak to nurse concerning an prescription. Please call pt

## 2013-01-03 NOTE — Telephone Encounter (Signed)
Pt states she missed her follow up appt with Dr  Mayford Knife due to confusion of her appt time.  She d/ced Carvedilol as instructed d/t bradycardia and seems to be doing fine.  She is concerned she was given an appointment to follow up be not for another month.  She would like to be scheduled sooner with Dr Mayford Knife.  Aware I do not see ant appointments with Dr Mayford Knife any earlier but will have her review to see when she can move her appointment up.

## 2013-01-03 NOTE — Telephone Encounter (Signed)
Please work her in to see me Thursday 10/9

## 2013-01-06 ENCOUNTER — Encounter: Payer: Self-pay | Admitting: Cardiology

## 2013-01-06 ENCOUNTER — Ambulatory Visit (INDEPENDENT_AMBULATORY_CARE_PROVIDER_SITE_OTHER): Payer: Medicare Other | Admitting: Cardiology

## 2013-01-06 VITALS — BP 159/74 | HR 67 | Ht 65.0 in | Wt 116.0 lb

## 2013-01-06 DIAGNOSIS — I43 Cardiomyopathy in diseases classified elsewhere: Secondary | ICD-10-CM

## 2013-01-06 DIAGNOSIS — I428 Other cardiomyopathies: Secondary | ICD-10-CM | POA: Insufficient documentation

## 2013-01-06 DIAGNOSIS — I10 Essential (primary) hypertension: Secondary | ICD-10-CM | POA: Diagnosis not present

## 2013-01-06 DIAGNOSIS — I4891 Unspecified atrial fibrillation: Secondary | ICD-10-CM | POA: Diagnosis not present

## 2013-01-06 DIAGNOSIS — I5032 Chronic diastolic (congestive) heart failure: Secondary | ICD-10-CM

## 2013-01-06 DIAGNOSIS — R000 Tachycardia, unspecified: Secondary | ICD-10-CM | POA: Diagnosis not present

## 2013-01-06 DIAGNOSIS — I509 Heart failure, unspecified: Secondary | ICD-10-CM

## 2013-01-06 MED ORDER — LISINOPRIL 10 MG PO TABS: 10.0000 mg | ORAL_TABLET | Freq: Every day | ORAL | Status: DC

## 2013-01-06 NOTE — Progress Notes (Signed)
[address] [place], [place]  [address] Phone: [phone] Fax:  [phone]  Date:  01/06/2013   ID:  Cynthia Wright, DOB 12/26/1924, MRN [id number]  PCP:  Beverley Fiedler, MD  Cardiologist:  Armanda Magic, MD     History of Present Illness: Cynthia Wright is a 79 y.o. 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  She had a cardioversion in May 2014 and has been maintaining NSR.  She denies any palpitations, chest pain, SOB, DOE, LE edema or syncope.  Her main complaint today is fatigue and dizziness.  She says that she does not have any energy anymore and cannot do the things she used to do.  She is also having some balance problems as well.  She has not had any falls since I saw her last.  She was in the ER the last of August with some staggering gait.  She gets dizzy when she moves around and loses her equilibrium.  She also has a spinning sensation.  The ER records states that she complained of CP as well but she adamantly denies that she had CP that day.  She had a recent nuclear stress test that showed no ischemia.  SHe had some bradycardia and her carvedilol was stopped and her bradycardia has resolved.   Wt Readings from Last 3 Encounters:  01/06/13 116 lb (52.617 kg)  08/10/12 132 lb 4.4 oz (60 kg)  08/10/12 132 lb 4.4 oz (60 kg)     Past Medical History  Diagnosis Date  . Hypertension   . Diabetes mellitus without complication   . Hyperlipidemia   . Atrial fibrillation     s/p DCCV 07/2012  . Chronic anticoagulation   . Dilated cardiomyopathy secondary to tachycardia 07/2012    2D echo 10/2012 showed EF 65% with grade II diastolic dysfunction  . Liver cyst   . chronic diastolic chf     EF now 65% by echo 10/2012 with grade II diasotlid dysfunction  . Chronic combined systolic and diastolic CHF (congestive heart failure) 07/2012    Current Outpatient Prescriptions  Medication  Sig Dispense Refill  . amiodarone (PACERONE) 200 MG tablet Take 1 tablet (200 mg total) by mouth daily.  30 tablet  11  . apixaban (ELIQUIS) 2.5 MG TABS tablet Take 1 tablet (2.5 mg total) by mouth 2 (two) times daily.  60 tablet  11  . furosemide (LASIX) 40 MG tablet Take 40 mg by mouth daily.      [name] Kitchen lisinopril (PRINIVIL,ZESTRIL) 5 MG tablet Take 1 tablet (5 mg total) by mouth daily.  30 tablet  11  . Multiple Vitamin (MULTIVITAMIN WITH MINERALS) TABS Take 1 tablet by mouth daily.      [name] Kitchen OVER THE COUNTER MEDICATION Take 200 mg by mouth at bedtime. Take 2 stool softener capsules before bedtime      . OVER THE COUNTER MEDICATION Place 3 drops into both eyes daily as needed (burning sensation in eyes). Ensure rewetting drops      . potassium chloride SA (K-DUR,KLOR-CON) 20 MEQ tablet Take 1 tablet (20 mEq total) by mouth 2 (two) times daily.  60 tablet  11  . Probiotic Product (PROBIOTIC DAILY PO) Take 2 capsules by mouth daily.       . rosuvastatin (CRESTOR) 5 MG tablet Take 5 mg by mouth daily.      . sitaGLIPtin (JANUVIA) 100 MG tablet Take 100 mg  by mouth daily.      [name] Kitchen zolpidem (AMBIEN) 5 MG tablet Take 5 mg by mouth at bedtime as needed for sleep.      . carvedilol (COREG) 3.125 MG tablet Take 1 tablet (3.125 mg total) by mouth 2 (two) times daily with a meal.  60 tablet  11   No current facility-administered medications for this visit.    Allergies:    Allergies  Allergen Reactions  . Codeine Other (See Comments)    "Just get sick"  . Lortab [Hydrocodone-Acetaminophen] Other (See Comments)    "Just get sick"    Social History:  The patient  reports that she has never smoked. She does not have any smokeless tobacco history on file. She reports that she drinks alcohol. She reports that she does not use illicit drugs.   Family History:  The patient's family history includes Heart disease in her father and mother.   ROS:  Please see the history of present illness.     All other systems  reviewed and negative.   PHYSICAL EXAM: VS:  BP 159/74  Pulse 67  Ht 5\' 5"  (1.651 m)  Wt 116 lb (52.617 kg)  BMI 19.3 kg/m2 Well nourished, well developed, in no acute distress HEENT: normal Neck: no JVD Cardiac:  normal S1, S2; RRR; no murmur Lungs:  clear to auscultation bilaterally, no wheezing, rhonchi or rales Abd: soft, nontender, no hepatomegaly Ext: no edema Skin: warm and dry Neuro:  CNs 2-12 intact, no focal abnormalities noted    ASSESSMENT AND PLAN:  1. Atrial fbrillation maintaining NSR on Amio  - continue amiodarone/Eliquis  - she will let me know if she has any falls since she is on anticoagulation 2.  HTN - mildly elevated BP today on exam  - increase Lisinopril to 10mg  daily   - I have asked her to check her BP daily for a week and call with results 3.  Tachyardia induced DCM - resolved 4.  Chronic diastolic CHF - well compensated  - continue Lasix    Follow up with me in 3 months   Signed, Armanda Magic, MD 01/06/2013 10:53 AM

## 2013-01-06 NOTE — Patient Instructions (Addendum)
Please check your BP daily for a week and call with the results.  Please call Dr. Mayford Knife if your BP drops below 100/70mmHg or is above 170/9mmHg  Your physician recommends that you schedule a follow-up appointment in: 3 months with Dr. Mayford Knife  Your lisinopril was increased to 10 MG Daily continue all other medications as prescribed.

## 2013-01-11 ENCOUNTER — Telehealth: Payer: Self-pay | Admitting: Cardiology

## 2013-01-11 DIAGNOSIS — R42 Dizziness and giddiness: Secondary | ICD-10-CM | POA: Diagnosis not present

## 2013-01-11 DIAGNOSIS — Z79899 Other long term (current) drug therapy: Secondary | ICD-10-CM

## 2013-01-11 DIAGNOSIS — L989 Disorder of the skin and subcutaneous tissue, unspecified: Secondary | ICD-10-CM | POA: Diagnosis not present

## 2013-01-11 DIAGNOSIS — Z23 Encounter for immunization: Secondary | ICD-10-CM | POA: Diagnosis not present

## 2013-01-11 NOTE — Telephone Encounter (Signed)
BP still elevated despite increasing Lisinopril to 10mg  daily.  Please have patient increase Lisinopril to 20mg  daily and check BP daily for a week and call with results.  Please have Cynthia Wright come in  In 1 week for BMET

## 2013-01-11 NOTE — Telephone Encounter (Signed)
To Dr. Mayford Knife to review Bp readings

## 2013-01-11 NOTE — Telephone Encounter (Signed)
New Problem    Pt calling in BP    10/5  ; 158/73      62  10/6      148/61     60  10/7      165/79     72  10/8      132/77      65  10/9       138/70     72  10/10      168/80    72              10/11     151/70      56             Thank You!

## 2013-01-12 MED ORDER — LISINOPRIL 20 MG PO TABS: 20.0000 mg | ORAL_TABLET | Freq: Every day | ORAL | Status: DC

## 2013-01-12 NOTE — Telephone Encounter (Signed)
New Problem ° °Pt returned call  °

## 2013-01-12 NOTE — Addendum Note (Signed)
Addended by: Nita Sells on: 01/12/2013 03:12 PM   Modules accepted: Orders

## 2013-01-12 NOTE — Telephone Encounter (Signed)
New Problem:  Pt states she'd like Danielle to go over her medication changes with her. Please advise

## 2013-01-12 NOTE — Telephone Encounter (Signed)
Pt is aware of med change and will come in next week for lab work and will give [place] her BP readings then.

## 2013-01-12 NOTE — Telephone Encounter (Signed)
LVM for pt to return call

## 2013-01-12 NOTE — Telephone Encounter (Signed)
Spoke with pt and she understands the med change now.

## 2013-01-19 ENCOUNTER — Other Ambulatory Visit (INDEPENDENT_AMBULATORY_CARE_PROVIDER_SITE_OTHER): Payer: Medicare Other

## 2013-01-19 DIAGNOSIS — Z79899 Other long term (current) drug therapy: Secondary | ICD-10-CM

## 2013-01-19 LAB — BASIC METABOLIC PANEL
CO2: 28 mEq/L (ref 19–32)
Calcium: 9.3 mg/dL (ref 8.4–10.5)
Chloride: 105 mEq/L (ref 96–112)
Creatinine, Ser: 1.4 mg/dL — ABNORMAL HIGH (ref 0.4–1.2)
GFR: 38.52 mL/min — ABNORMAL LOW (ref >60.00)
Glucose, Bld: 137 mg/dL — ABNORMAL HIGH (ref 70–99)
Sodium: 142 mEq/L (ref 135–145)

## 2013-01-20 ENCOUNTER — Telehealth: Payer: Self-pay | Admitting: General Surgery

## 2013-01-20 DIAGNOSIS — Z79899 Other long term (current) drug therapy: Secondary | ICD-10-CM

## 2013-01-20 MED ORDER — FUROSEMIDE 40 MG PO TABS: 40.0000 mg | ORAL_TABLET | ORAL | Status: DC

## 2013-01-20 MED ORDER — POTASSIUM CHLORIDE CRYS ER 20 MEQ PO TBCR: EXTENDED_RELEASE_TABLET | ORAL | Status: DC

## 2013-01-20 NOTE — Telephone Encounter (Signed)
Message copied by Nita Sells on Thu Jan 20, 2013  8:08 AM ------      Message from: Armanda Magic R      Created: Wed Jan 19, 2013  9:20 PM       Please have patient decrease Lasix and potassium to every other day and recheck BMET in 1 week ------

## 2013-01-21 DIAGNOSIS — L821 Other seborrheic keratosis: Secondary | ICD-10-CM | POA: Diagnosis not present

## 2013-01-21 DIAGNOSIS — C44519 Basal cell carcinoma of skin of other part of trunk: Secondary | ICD-10-CM | POA: Diagnosis not present

## 2013-01-25 NOTE — Telephone Encounter (Signed)
Follow up    Please give pt a call about medications.

## 2013-01-25 NOTE — Telephone Encounter (Signed)
Patient never came in for BMET check after increasing ACE I - please call her to come in for BMET

## 2013-01-25 NOTE — Telephone Encounter (Signed)
Spoke with pt. She was concerned on why she was moved up to 20 MG of Lisinopril. I explained it was bc when she sent [place] her last BP readings they were still too high and that Dr Mayford Knife increased them for her. She verbalized understanding and I reminded her of when her next appt was to see Dr. Mayford Knife and to bring her BP readings in at the time of the OV.

## 2013-01-27 ENCOUNTER — Other Ambulatory Visit: Payer: Medicare Other

## 2013-02-02 ENCOUNTER — Other Ambulatory Visit: Payer: Medicare Other

## 2013-02-03 ENCOUNTER — Encounter: Payer: Self-pay | Admitting: Cardiology

## 2013-02-03 ENCOUNTER — Ambulatory Visit (INDEPENDENT_AMBULATORY_CARE_PROVIDER_SITE_OTHER): Payer: Medicare Other | Admitting: Cardiology

## 2013-02-03 ENCOUNTER — Ambulatory Visit: Payer: Medicare Other | Admitting: Cardiology

## 2013-02-03 VITALS — BP 155/59 | HR 60 | Ht 64.0 in | Wt 118.0 lb

## 2013-02-03 DIAGNOSIS — I10 Essential (primary) hypertension: Secondary | ICD-10-CM | POA: Diagnosis not present

## 2013-02-03 LAB — BASIC METABOLIC PANEL
Calcium: 9.3 mg/dL (ref 8.4–10.5)
Chloride: 103 mEq/L (ref 96–112)
Creatinine, Ser: 1 mg/dL (ref 0.4–1.2)
GFR: 59.27 mL/min — ABNORMAL LOW (ref >60.00)
Glucose, Bld: 87 mg/dL (ref 70–99)

## 2013-02-03 MED ORDER — LISINOPRIL 40 MG PO TABS: 40.0000 mg | ORAL_TABLET | Freq: Every day | ORAL | Status: DC

## 2013-02-03 NOTE — Progress Notes (Signed)
[address] [place], [place]  [address] Phone: [phone] Fax:  [phone]  Date:  02/03/2013   ID:  Cynthia, Wright 09-18-1924, MRN [id number]  PCP:  Beverley Fiedler, MD  Cardiologist:  Armanda Magic, MD     History of Present Illness: Cynthia Wright is a 79 y.o. female with a history of HTN, PAF and chronic diastolic CHF who presents for BP check.  Her lisinopril has been adjusted and she is tolerating it well.     Wt Readings from Last 3 Encounters:  02/03/13 118 lb (53.524 kg)  01/06/13 116 lb (52.617 kg)  08/10/12 132 lb 4.4 oz (60 kg)     Past Medical History  Diagnosis Date  . Hypertension   . Diabetes mellitus without complication   . Hyperlipidemia   . Atrial fibrillation     s/p DCCV 07/2012  . Chronic anticoagulation   . Dilated cardiomyopathy secondary to tachycardia 07/2012    2D echo 10/2012 showed EF 65% with grade II diastolic dysfunction  . Liver cyst   . chronic diastolic chf     EF now 65% by echo 10/2012 with grade II diasotlid dysfunction  . Chronic combined systolic and diastolic CHF (congestive heart failure)     Current Outpatient Prescriptions  Medication Sig Dispense Refill  . amiodarone (PACERONE) 200 MG tablet Take 1 tablet (200 mg total) by mouth daily.  30 tablet  11  . apixaban (ELIQUIS) 2.5 MG TABS tablet Take 1 tablet (2.5 mg total) by mouth 2 (two) times daily.  60 tablet  11  . furosemide (LASIX) 40 MG tablet Take 1 tablet (40 mg total) by mouth every other day.  30 tablet  11  . lisinopril (PRINIVIL,ZESTRIL) 20 MG tablet Take 1 tablet (20 mg total) by mouth daily.  30 tablet  11  . Multiple Vitamin (MULTIVITAMIN WITH MINERALS) TABS Take 1 tablet by mouth daily.      [name] Kitchen OVER THE COUNTER MEDICATION Take 200 mg by mouth at bedtime. Take 2 stool softener capsules before bedtime      . OVER THE COUNTER MEDICATION Place 3 drops into both eyes daily as needed (burning sensation in eyes). Ensure rewetting drops      .  potassium chloride SA (K-DUR,KLOR-CON) 20 MEQ tablet Take two tablets every other day with lasix  60 tablet  11  . Probiotic Product (PROBIOTIC DAILY PO) Take 2 capsules by mouth daily.       . rosuvastatin (CRESTOR) 5 MG tablet Take 5 mg by mouth daily.      . sitaGLIPtin (JANUVIA) 100 MG tablet Take 100 mg by mouth daily.      [name] Kitchen zolpidem (AMBIEN) 5 MG tablet Take 5 mg by mouth at bedtime as needed for sleep.       No current facility-administered medications for this visit.    Allergies:    Allergies  Allergen Reactions  . Codeine Other (See Comments)    "Just get sick"  . Lortab [Hydrocodone-Acetaminophen] Other (See Comments)    "Just get sick"    Social History:  The patient  reports that she has never smoked. She does not have any smokeless tobacco history on file. She reports that she drinks alcohol. She reports that she does not use illicit drugs.   Family History:  The patient's family history includes Heart disease in her father and mother.   ROS:  Please see the history of present illness.  All other systems reviewed and negative.    ASSESSMENT AND PLAN:  1. HTN - Her BP is still  mildly elevated.  She brought her readings in today for the past month.  Even after increasing lisinopril to 20mg  her BP still ranges from 126-172/70's.    - Increase Lisinopril to 40mg  daily and I have asked her to check her BP daily for a week and call with results.   Followup with me in 3 months  Signed, Armanda Magic, MD 02/03/2013 11:33 AM

## 2013-02-03 NOTE — Patient Instructions (Signed)
Your physician has recommended you make the following change in your medication: Increase Lisinopril to 40 MG daily  Your physician recommends that you go to the lab today for a Basic Metabolic Panel  Please take your BP readings every day for a week and call us at the end of the week with the results.

## 2013-02-04 ENCOUNTER — Encounter: Payer: Self-pay | Admitting: General Surgery

## 2013-02-04 ENCOUNTER — Other Ambulatory Visit: Payer: Self-pay

## 2013-02-04 ENCOUNTER — Telehealth: Payer: Self-pay

## 2013-02-04 NOTE — Telephone Encounter (Signed)
questions

## 2013-02-11 ENCOUNTER — Telehealth: Payer: Self-pay | Admitting: Cardiology

## 2013-02-11 NOTE — Telephone Encounter (Signed)
New problem ° ° °Pt returning your call. Please call pt. °

## 2013-02-11 NOTE — Telephone Encounter (Signed)
Bp readings for the pt from the past few days Nov 9th 121/85 P 63 Nov 10th 169/80 P 61 Nov 11th 138/79 P 69 Nov 12th 155/75 P 78 Nov 13th 155/78 P 71 Nov 14th 148/86 P 76

## 2013-02-11 NOTE — Telephone Encounter (Signed)
To Dr Turner to review 

## 2013-02-11 NOTE — Telephone Encounter (Signed)
Please make sure patient follows a 2gm sodium diet with no table salt and have her check daily again for a week and call

## 2013-02-11 NOTE — Telephone Encounter (Signed)
Pt is aware and will call us in a week with her BP readings.

## 2013-03-03 ENCOUNTER — Telehealth: Payer: Self-pay | Admitting: Cardiology

## 2013-03-03 MED ORDER — AMLODIPINE BESYLATE 2.5 MG PO TABS: 2.5000 mg | ORAL_TABLET | Freq: Every day | ORAL | Status: DC

## 2013-03-03 NOTE — Telephone Encounter (Signed)
BP readings still to high - please add Amlodipine 2.5mg  daily and check BP daily for a week and call with results

## 2013-03-03 NOTE — Telephone Encounter (Signed)
Pt is aware. Med list updated and rx sent in for pt.

## 2013-03-03 NOTE — Telephone Encounter (Signed)
To Dr Turner to advise 

## 2013-03-03 NOTE — Telephone Encounter (Signed)
New problem    Pt calling in readings for BP this last week:   11/27   132/105  Hr 80  12/1      136/87  Hr 73  12/2     137/68   Hr 79  12/3     159/78   Hr 74  12/4      155/71    Hr 75

## 2013-03-08 ENCOUNTER — Telehealth: Payer: Self-pay | Admitting: Cardiology

## 2013-03-08 NOTE — Telephone Encounter (Signed)
LMTCB @1100 /PE

## 2013-03-08 NOTE — Telephone Encounter (Signed)
New Problem:  Pt is reporting BP readings  12/2   137/68      79 heartrate 12/3    159/78     74 heartrate 12/4   155/71      75 heartrate 12/5   176/78      81 HR 12/6    149/72     75 HR 12/7    145/80     72 HR  Pt is also requesting a call back to discuss her medications.

## 2013-03-08 NOTE — Telephone Encounter (Signed)
Patient calling with concerns regarding confusion with med list. She is uncertain of current medications and dosages. Med list in EPIC do not match patient's most recent medications called in to her pharmacy (notably Carvedilol 3.125 mg twice daily, as reported by patient stating that is what is on the pharmacy bottle). This looks to have possibly been called in on 12/4.  BP readings are remaining elevated as noted by patient reporting her readings below.  Patient would like for Dr. Mayford Knife to review her medications, blood pressure readings and determine the correct medications/dosages she should be on currently. She would also like for the corrected med list to be mailed to her home so that she has it as a hard copy to refer to, as she is concerned she is not taking her medications correctly. Routed to Dr. Mayford Knife and Launa Flight, CMA.

## 2013-03-09 NOTE — Telephone Encounter (Signed)
Called pharmacy and went over all changes we have made to pts med list since October. We D/C coreg. Increased lisinopril to 40 MG and also added on amlodipine 2.5 MG daily. We updated all rxs and deleted any outdated. It Appears pt has still been taking her coreg daily. She picked the rx up October, November, and December. I tried calling pt to clear up meds and there was no answer. I left her a vm to return my call.

## 2013-03-09 NOTE — Telephone Encounter (Signed)
LVm for pt to return call.  

## 2013-03-09 NOTE — Telephone Encounter (Signed)
Please call pharmacy to document med- the last med that was called in recently was supposed to be for Amlodipine 2.5mg  daily not coreg

## 2013-03-10 NOTE — Telephone Encounter (Signed)
Pt is aware to discard of coreg and that the pharmacy is also aware she should not be on this med. I sent her a updated copy of her med list for her files.

## 2013-03-10 NOTE — Telephone Encounter (Signed)
Pt is aware.  

## 2013-04-11 ENCOUNTER — Telehealth: Payer: Self-pay | Admitting: General Surgery

## 2013-04-11 ENCOUNTER — Telehealth: Payer: Self-pay | Admitting: Cardiology

## 2013-04-11 NOTE — Telephone Encounter (Signed)
New message    C/O   Afib today.

## 2013-04-11 NOTE — Telephone Encounter (Signed)
Pt called Very upset stating she saw her PCP and they stated she needed to contact her Cardiologist immediately to be seen for her symptoms. I explained to the pt Dr Radford Pax would not be able to see her tomorrow, but would able to see her Weds. Pt got very upset and kept repeating "Im' Sick", Donnald Garre been with yall over a year your supposed to help me. I asked pt about her symptoms. She said she had a history of Afib and that She has extreme fatigue and last night she had indigestion with some tightness in her chest because of it. She had no other cardiac symptoms such as SOB, Sweating, or Arm or jaw pain. I explained to the pt if she wanted to be seen tomorrow she could see one of our NP but I was told by the operator she refused anyone but Dr Radford Pax. "Pt said I never refused to see anyone. But I am not seeing anyone but Dr Encinal Blas sick."I explained again we could not see her until Weds and that if she wants to be seen tomorrow she can see one of our NPs. She denied again and stated she would wait until Weds. I explained to pt on weds we would do an EKG when she arrived to see if she was in Afib. She stated anything that was to be ordered on herr that day by Dr Radford Pax must be done the day of. She stated "Im sick and it must be done that day". I explained that most procedures are not done the day of in office but that we would do our best. I looked on [facility] to see The OV Dr Radene Ou did on pt, but pt has not seen the PCP for this yet. I printed the telephone encounter between the PCP and Pt for Dr Radford Pax to [place].

## 2013-04-11 NOTE — Telephone Encounter (Signed)
Dr Radford Pax is only in the office for a 1/4 day tomorrow. We are already booked. Please explain to pt she needs to see a NP if she would like to be seen.

## 2013-04-11 NOTE — Telephone Encounter (Signed)
Follow up    Patient calling back stating she hasn't heard anything from the nurse.    Advise patient that the nurse was in clinic today with another physician.   Patient wants to be seen tomorrow by Dr. Radford Pax  Offer appt with NPA/ PA on tomorrow patient refuse.

## 2013-04-13 ENCOUNTER — Encounter: Payer: Self-pay | Admitting: General Surgery

## 2013-04-13 ENCOUNTER — Encounter: Payer: Self-pay | Admitting: Cardiology

## 2013-04-13 ENCOUNTER — Ambulatory Visit (INDEPENDENT_AMBULATORY_CARE_PROVIDER_SITE_OTHER): Payer: Medicare Other | Admitting: Cardiology

## 2013-04-13 VITALS — BP 142/56 | HR 58 | Ht 65.0 in | Wt 116.8 lb

## 2013-04-13 DIAGNOSIS — I10 Essential (primary) hypertension: Secondary | ICD-10-CM

## 2013-04-13 DIAGNOSIS — I480 Paroxysmal atrial fibrillation: Secondary | ICD-10-CM

## 2013-04-13 DIAGNOSIS — R5381 Other malaise: Secondary | ICD-10-CM

## 2013-04-13 DIAGNOSIS — R000 Tachycardia, unspecified: Secondary | ICD-10-CM | POA: Diagnosis not present

## 2013-04-13 DIAGNOSIS — I4891 Unspecified atrial fibrillation: Secondary | ICD-10-CM | POA: Diagnosis not present

## 2013-04-13 DIAGNOSIS — I5032 Chronic diastolic (congestive) heart failure: Secondary | ICD-10-CM

## 2013-04-13 DIAGNOSIS — R5383 Other fatigue: Secondary | ICD-10-CM

## 2013-04-13 DIAGNOSIS — I43 Cardiomyopathy in diseases classified elsewhere: Secondary | ICD-10-CM

## 2013-04-13 DIAGNOSIS — I509 Heart failure, unspecified: Secondary | ICD-10-CM

## 2013-04-13 DIAGNOSIS — R0789 Other chest pain: Secondary | ICD-10-CM

## 2013-04-13 NOTE — Patient Instructions (Signed)
Your physician has recommended you make the following change in your medication: Stop carvedilol  Your physician recommends that you go to the lab today for a BMET, CBC, and TSH  Your physician has requested that you have an echocardiogram. Echocardiography is a painless test that uses sound waves to create images of your heart. It provides your doctor with information about the size and shape of your heart and how well your heart's chambers and valves are working. This procedure takes approximately one hour. There are no restrictions for this procedure.  Your physician has recommended that you wear an event monitor. Event monitors are medical devices that record the heart's electrical activity. Doctors most often [place] these monitors to diagnose arrhythmias. Arrhythmias are problems with the speed or rhythm of the heartbeat. The monitor is a small, portable device. You can wear one while you do your normal daily activities. This is usually used to diagnose what is causing palpitations/syncope (passing out).  Your physician recommends that you schedule a follow-up appointment on Friday for a EKG/BP Nurse visit  Your physician recommends that you schedule a follow-up appointment in: 4 Weeks with Dr Radford Pax

## 2013-04-13 NOTE — Progress Notes (Signed)
[address] [place], [place]  [address] Phone: [phone] Fax:  [phone]  Date:  04/13/2013   ID:  Andjela, Wickes 1924/09/29, MRN [id number]  PCP:  Milagros Evener, MD  Cardiologist:  Fransico Him, MD     History of Present Illness: Cynthia Wright is a 80 y.o. 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  She had indigestion a few nights ago and then has some tightness in her chest.  She has also been fatigued recently and says that she can hardly do any activities due to fatigue.  She denies any SOB or diaphoresis.  She had a nuclear stress test 07/2012 that was normal.  She says that the fatigue has been occurring for about 10 days.  She feels tired and has had some dizziness feeling off balance with the room with her equilibrium out of wack.  She describes the chest pain as a tightness in the middle of her chest but thought it was indigestion because it improved after belching.  She has had it about 3 times and usually lasts 20 minutes and is off and on.     Wt Readings from Last 3 Encounters:  02/03/13 118 lb (53.524 kg)  01/06/13 116 lb (52.617 kg)  08/10/12 132 lb 4.4 oz (60 kg)     Past Medical History  Diagnosis Date  . Hypertension   . Diabetes mellitus without complication   . Hyperlipidemia   . Atrial fibrillation     s/p DCCV 07/2012  . Chronic anticoagulation   . Dilated cardiomyopathy secondary to tachycardia 07/2012    2D echo 10/2012 showed EF 65% with grade II diastolic dysfunction  . Liver cyst   . chronic diastolic chf     EF now 64% by echo 10/2012 with grade II diasotlid dysfunction    Current Outpatient Prescriptions  Medication Sig Dispense Refill  . amiodarone (PACERONE) 200 MG tablet Take 1 tablet (200 mg total) by mouth daily.  30 tablet  11  . amLODipine (NORVASC) 2.5 MG tablet Take 1  tablet (2.5 mg total) by mouth daily.  30 tablet  11  . apixaban (ELIQUIS) 2.5 MG TABS tablet Take 1 tablet (2.5 mg total) by mouth 2 (two) times daily.  60 tablet  11  . furosemide (LASIX) 40 MG tablet Take 1 tablet (40 mg total) by mouth every other day.  30 tablet  11  . lisinopril (PRINIVIL,ZESTRIL) 40 MG tablet Take 1 tablet (40 mg total) by mouth daily.  30 tablet  11  . Multiple Vitamin (MULTIVITAMIN WITH MINERALS) TABS Take 1 tablet by mouth daily.      [name] Kitchen OVER THE COUNTER MEDICATION Take 200 mg by mouth at bedtime. Take 2 stool softener capsules before bedtime      . OVER THE COUNTER MEDICATION Place 3 drops into both eyes daily as needed (burning sensation in eyes). Ensure rewetting drops      . potassium chloride SA (K-DUR,KLOR-CON) 20 MEQ tablet Take two tablets every other day with lasix  60 tablet  11  . Probiotic Product (PROBIOTIC DAILY PO) Take 2 capsules by mouth daily.       . rosuvastatin (CRESTOR) 5 MG tablet Take 5 mg by mouth daily.      . sitaGLIPtin (JANUVIA) 100 MG tablet Take 100 mg by mouth daily.      [name] Kitchen  zolpidem (AMBIEN) 5 MG tablet Take 5 mg by mouth at bedtime as needed for sleep.       No current facility-administered medications for this visit.    Allergies:    Allergies  Allergen Reactions  . Codeine Other (See Comments)    "Just get sick"  . Lortab [Hydrocodone-Acetaminophen] Other (See Comments)    "Just get sick"    Social History:  The patient  reports that she has quit smoking. She does not have any smokeless tobacco history on file. She reports that she drinks alcohol. She reports that she does not use illicit drugs.   Family History:  The patient's family history includes Heart disease in her father and mother.   ROS:  Please see the history of present illness.      All other systems reviewed and negative.   PHYSICAL EXAM: VS:  There were no vitals taken for this visit. Well nourished, well developed, in no acute distress HEENT: normal Neck:  no JVD Cardiac:  normal S1, S2; RRR; no murmur Lungs:  clear to auscultation bilaterally, no wheezing, rhonchi or rales Abd: soft, nontender, no hepatomegaly Ext: no edema Skin: warm and dry Neuro:  CNs 2-12 intact, no focal abnormalities noted  EKG:       ASSESSMENT AND PLAN:  1. PAF now in what appears to be by EKG junctional bradycardia  - I will stop Carvedilol   - continue Eliquis/Amiodarone  - event monitor 2. HTN - controlled  - Lisinopril/amlodipine   - stop carvedilol due to bradycardia 3. Chronic diastolic CHF - appears compensated  - continue Lasix 4. Tachycardia induced DCM now with normal EF 5.  Chronic systemic anticoagulation 6.  Exertional fatigue and dizziness with bradycardia and probable junctional rhythm on EKG  - I have instructed her to stop her Carvedilol and she will followup on Friday for repeat EKG with our nurse.    - check TSH/CBC/BMET  - check 2D echo to assess LVF 7.  Atypical CP relieved with belching.  SHe has lost her appetite and lost 15 pounds.  Her PCP is aware of this.  She had a normal nuclear stress test less than a year ago  Followup with nurse on Friday 1/16  for EKG and BP check  Followup with me in 4 weeks  Signed, Fransico Him, MD 04/13/2013 3:17 PM

## 2013-04-14 LAB — BASIC METABOLIC PANEL
BUN: 24 mg/dL — AB (ref 6–23)
CHLORIDE: 101 meq/L (ref 96–112)
CO2: 33 meq/L — AB (ref 19–32)
CREATININE: 1.3 mg/dL — AB (ref 0.4–1.2)
Calcium: 9.1 mg/dL (ref 8.4–10.5)
GFR: 40.53 mL/min — ABNORMAL LOW (ref >60.00)
Glucose, Bld: 100 mg/dL — ABNORMAL HIGH (ref 70–99)
Potassium: 4 mEq/L (ref 3.5–5.1)
Sodium: 141 mEq/L (ref 135–145)

## 2013-04-14 LAB — CBC
HEMATOCRIT: 38.4 % (ref 36.0–46.0)
Hemoglobin: 12.9 g/dL (ref 12.0–15.0)
MCHC: 33.5 g/dL (ref 30.0–36.0)
MCV: 99.7 fl (ref 78.0–100.0)
Platelets: 196 10*3/uL (ref 150.0–400.0)
RBC: 3.86 Mil/uL — ABNORMAL LOW (ref 3.87–5.11)
RDW: 13.2 % (ref 11.5–14.6)
WBC: 6.5 10*3/uL (ref 4.5–10.5)

## 2013-04-14 LAB — TSH: TSH: 5.53 u[IU]/mL — AB (ref 0.35–5.50)

## 2013-04-18 DIAGNOSIS — E7800 Pure hypercholesterolemia, unspecified: Secondary | ICD-10-CM | POA: Diagnosis not present

## 2013-04-18 DIAGNOSIS — E119 Type 2 diabetes mellitus without complications: Secondary | ICD-10-CM | POA: Diagnosis not present

## 2013-04-18 DIAGNOSIS — R946 Abnormal results of thyroid function studies: Secondary | ICD-10-CM | POA: Diagnosis not present

## 2013-04-19 ENCOUNTER — Encounter (INDEPENDENT_AMBULATORY_CARE_PROVIDER_SITE_OTHER): Payer: Medicare Other

## 2013-04-19 ENCOUNTER — Encounter: Payer: Self-pay | Admitting: Cardiology

## 2013-04-19 ENCOUNTER — Encounter: Payer: Self-pay | Admitting: *Deleted

## 2013-04-19 ENCOUNTER — Ambulatory Visit (INDEPENDENT_AMBULATORY_CARE_PROVIDER_SITE_OTHER): Payer: Medicare Other | Admitting: *Deleted

## 2013-04-19 VITALS — BP 128/76 | HR 65 | Ht 64.0 in | Wt 116.5 lb

## 2013-04-19 DIAGNOSIS — I4891 Unspecified atrial fibrillation: Secondary | ICD-10-CM

## 2013-04-19 DIAGNOSIS — I509 Heart failure, unspecified: Secondary | ICD-10-CM | POA: Diagnosis not present

## 2013-04-19 DIAGNOSIS — R000 Tachycardia, unspecified: Secondary | ICD-10-CM

## 2013-04-19 DIAGNOSIS — I43 Cardiomyopathy in diseases classified elsewhere: Secondary | ICD-10-CM | POA: Diagnosis not present

## 2013-04-19 DIAGNOSIS — I480 Paroxysmal atrial fibrillation: Secondary | ICD-10-CM

## 2013-04-19 NOTE — Progress Notes (Signed)
Patient ID: Cynthia Wright, female   DOB: [birth year], 80 y.o.   MRN: [id number] [employer] 30 day cardiac event monitor applied to patient.

## 2013-04-19 NOTE — Progress Notes (Signed)
Pt in for an EKG. EKG done per nurse and  to be read by Dr. Radford Pax MD. BP left arm 128/76. Pt had no C/O. Pt D/C home in stable condition. Pt is aware that I will call her after MD reads the EKG. Pt verbalized understanding

## 2013-04-20 NOTE — Progress Notes (Signed)
Pt is aware of the EKG done 04/19/13 read per Dr. Radford Pax and will wait for the Monitor results. Pt verbalized understanding.

## 2013-05-05 ENCOUNTER — Other Ambulatory Visit (HOSPITAL_COMMUNITY): Payer: Medicare Other

## 2013-05-09 ENCOUNTER — Telehealth: Payer: Self-pay | Admitting: Cardiology

## 2013-05-09 NOTE — Telephone Encounter (Signed)
New message     Talk to a nurse regarding her upcoming appt-------"what is it for?"

## 2013-05-10 NOTE — Telephone Encounter (Signed)
Made pt aware this is her 4 week f/u. Pt has been on the monitor two weeks now and has been miserable with it on. I told her I would get a print out for Dr Radford Pax to review when she comes in to see her tomorrow and we will see if we have enough info for pt to be able to take off monitor. Cynthia Wright is aware and printing a copy to bring over to [place] today. To Dr Radford Pax to make aware.

## 2013-05-11 ENCOUNTER — Encounter: Payer: Self-pay | Admitting: Cardiology

## 2013-05-11 ENCOUNTER — Ambulatory Visit (INDEPENDENT_AMBULATORY_CARE_PROVIDER_SITE_OTHER): Payer: Medicare Other | Admitting: Cardiology

## 2013-05-11 VITALS — BP 148/55 | HR 58 | Ht 64.0 in | Wt 117.8 lb

## 2013-05-11 DIAGNOSIS — I10 Essential (primary) hypertension: Secondary | ICD-10-CM | POA: Diagnosis not present

## 2013-05-11 DIAGNOSIS — I4891 Unspecified atrial fibrillation: Secondary | ICD-10-CM

## 2013-05-11 DIAGNOSIS — R5383 Other fatigue: Secondary | ICD-10-CM

## 2013-05-11 DIAGNOSIS — R001 Bradycardia, unspecified: Secondary | ICD-10-CM | POA: Insufficient documentation

## 2013-05-11 DIAGNOSIS — I5032 Chronic diastolic (congestive) heart failure: Secondary | ICD-10-CM | POA: Diagnosis not present

## 2013-05-11 DIAGNOSIS — I509 Heart failure, unspecified: Secondary | ICD-10-CM

## 2013-05-11 DIAGNOSIS — I498 Other specified cardiac arrhythmias: Secondary | ICD-10-CM | POA: Diagnosis not present

## 2013-05-11 DIAGNOSIS — R5381 Other malaise: Secondary | ICD-10-CM

## 2013-05-11 DIAGNOSIS — R42 Dizziness and giddiness: Secondary | ICD-10-CM

## 2013-05-11 DIAGNOSIS — I480 Paroxysmal atrial fibrillation: Secondary | ICD-10-CM

## 2013-05-11 DIAGNOSIS — R000 Tachycardia, unspecified: Secondary | ICD-10-CM

## 2013-05-11 DIAGNOSIS — I43 Cardiomyopathy in diseases classified elsewhere: Secondary | ICD-10-CM

## 2013-05-11 MED ORDER — AMIODARONE HCL 200 MG PO TABS: ORAL_TABLET | ORAL | Status: DC

## 2013-05-11 NOTE — Progress Notes (Signed)
[address], [place], [place]  [address] Phone: [phone] Fax:  [phone]  Date:  05/11/2013   ID:  Cynthia Wright, Cynthia Wright 1925-02-05, MRN [id number]  PCP:  Milagros Evener, MD  Cardiologist:  Fransico Him, MD     History of Present Illness: Cynthia Wright is a 80 y.o. 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 She had indigestion for a few nights then had some tightness in her chest. She had also been fatigued recently and says that she can hardly do any activities due to fatigue. She denied any SOB or diaphoresis. She had a nuclear stress test 07/2012 that was normal. She was feeling tired and  had some dizziness feeling off balance with the room with her equilibrium out of wack. She described the chest pain as a tightness in the middle of her chest but thought it was indigestion because it improved after belching. At last OV she was in a junctional bradycardia and her carvedilol was stopped.  She was placed on a heart monitor.  Her TSH was mildly elevated and this was sent to her PCP to address.  Her BMET and CBC were ok.  She now presents back for followup.  She never had her echo done due to cancelling it due to vertigo.  She says that her fatigue is about the same but her vertigo is worse.  She saw her PCP back who does not know what is wrong.  The chest tightness has completely resolved and she denies any SOB.     Wt Readings from Last 3 Encounters:  05/11/13 117 lb 12.8 oz (53.434 kg)  04/19/13 116 lb 8 oz (52.844 kg)  04/13/13 116 lb 12.8 oz (52.98 kg)     Past Medical History  Diagnosis Date  . Hypertension   . Diabetes mellitus without complication   . Hyperlipidemia   . Atrial fibrillation     s/p DCCV 07/2012  . Chronic anticoagulation   . Dilated cardiomyopathy secondary to tachycardia 07/2012    2D echo  10/2012 showed EF 65% with grade II diastolic dysfunction  . Liver cyst   . chronic diastolic chf     EF now 83% by echo 10/2012 with grade II diasotlid dysfunction    Current Outpatient Prescriptions  Medication Sig Dispense Refill  . amiodarone (PACERONE) 200 MG tablet Take 1 tablet (200 mg total) by mouth daily.  30 tablet  11  . amLODipine (NORVASC) 2.5 MG tablet Take 1 tablet (2.5 mg total) by mouth daily.  30 tablet  11  . apixaban (ELIQUIS) 2.5 MG TABS tablet Take 1 tablet (2.5 mg total) by mouth 2 (two) times daily.  60 tablet  11  . furosemide (LASIX) 40 MG tablet Take 1 tablet (40 mg total) by mouth every other day.  30 tablet  11  . lisinopril (PRINIVIL,ZESTRIL) 40 MG tablet Take 1 tablet (40 mg total) by mouth daily.  30 tablet  11  . Multiple Vitamin (MULTIVITAMIN WITH MINERALS) TABS Take 1 tablet by mouth daily.      [name] Kitchen OVER THE COUNTER MEDICATION Take 200 mg by mouth at bedtime. Take 2 stool softener capsules before bedtime      . OVER THE COUNTER MEDICATION Place 3 drops into both eyes daily as needed (burning sensation in eyes). Ensure rewetting drops      .  potassium chloride SA (K-DUR,KLOR-CON) 20 MEQ tablet Take two tablets every other day with lasix  60 tablet  11  . Probiotic Product (PROBIOTIC DAILY PO) Take 2 capsules by mouth daily.       . rosuvastatin (CRESTOR) 5 MG tablet Take 5 mg by mouth daily.      . sitaGLIPtin (JANUVIA) 100 MG tablet Take 100 mg by mouth daily.      [name] Kitchen zolpidem (AMBIEN) 5 MG tablet Take 5 mg by mouth at bedtime as needed for sleep.       No current facility-administered medications for this visit.    Allergies:    Allergies  Allergen Reactions  . Codeine Other (See Comments)    "Just get sick"  . Lortab [Hydrocodone-Acetaminophen] Other (See Comments)    "Just get sick"    Social History:  The patient  reports that she has quit smoking. She does not have any smokeless tobacco history on file. She reports that she drinks alcohol. She  reports that she does not use illicit drugs.   Family History:  The patient's family history includes Heart disease in her father and mother.   ROS:  Please see the history of present illness.      All other systems reviewed and negative.   PHYSICAL EXAM: VS:  BP 148/55  Pulse 58  Ht 5\' 4"  (1.626 m)  Wt 117 lb 12.8 oz (53.434 kg)  BMI 20.21 kg/m2 Well nourished, well developed, in no acute distress HEENT: normal Neck: no JVD Cardiac:  normal S1, S2; RRR; no murmur Lungs:  clear to auscultation bilaterally, no wheezing, rhonchi or rales Abd: soft, nontender, no hepatomegaly Ext: no edema Skin: warm and dry Neuro:  CNs 2-12 intact, no focal abnormalities noted  EKG:     ? Sinus bradycardia at 57bpm with sinus arrhythmia  ASSESSMENT AND PLAN:  1. Vertigo unlikely to be cardiac in origin unless it is coming from her amio or Lasix  - set up ENT appt for workup of vertigo 2. HTN - mildly elevated  - continue amlodipine/lisinopril 3. PAF s/p TEE/DCCV - now bradycardic which could be contributing to her fatigue  - continue apixaban   - decrease Amiodarone to 100mg  daily - she is currently wearing a heart monitor so we can assess for ongoing bradycarrhythmias 4.  Tachycardia induced DCM now resolved   - stop Lasix since her LVF has normalized and she has no edema 5.  Fatigue ? Related to bradyarrhythmias  - decrease Amio  - reschedule echo to reassess LVF  Followup with me in 1 week  Signed, Fransico Him, MD 05/11/2013 10:55 AM

## 2013-05-11 NOTE — Patient Instructions (Addendum)
Please reschedule your echocardiogram at check out.  Your physician has recommended you make the following change in your medication:   1. Stop Lasix.  2. Decrease Amiodarone to 200 mg 1/2 tablet daily.   You have been referred to ENT to see Dr. Thornell Mule.   Your physician recommends that you schedule a follow-up appointment in: 1 week with Dr. Radford Pax.

## 2013-05-16 DIAGNOSIS — S8990XA Unspecified injury of unspecified lower leg, initial encounter: Secondary | ICD-10-CM | POA: Diagnosis not present

## 2013-05-16 DIAGNOSIS — R42 Dizziness and giddiness: Secondary | ICD-10-CM | POA: Diagnosis not present

## 2013-05-16 DIAGNOSIS — S99929A Unspecified injury of unspecified foot, initial encounter: Secondary | ICD-10-CM | POA: Diagnosis not present

## 2013-05-16 DIAGNOSIS — I951 Orthostatic hypotension: Secondary | ICD-10-CM | POA: Diagnosis not present

## 2013-05-18 ENCOUNTER — Ambulatory Visit: Payer: Medicare Other | Admitting: Cardiology

## 2013-05-19 ENCOUNTER — Ambulatory Visit (HOSPITAL_COMMUNITY): Payer: Medicare Other

## 2013-05-26 ENCOUNTER — Ambulatory Visit: Payer: Medicare Other | Admitting: Cardiology

## 2013-05-31 ENCOUNTER — Telehealth: Payer: Self-pay | Admitting: Cardiology

## 2013-05-31 NOTE — Telephone Encounter (Signed)
Pt notified. Pt feels about the same and she still has intermittent dizziness daily. Pt will see an ENT next week for possible vertigo. Pt denies fluttering/palpitations in her chest. Pt states she passed out in the bathroom (lost consciousness-blacked out) the week before last and she fell and hurt her toe. She also had a bowel movement after she fell to the floor; this is the second occurrence. Pt went to see Dr. Zadie Rhine and had an x-ray of her foot.

## 2013-05-31 NOTE — Telephone Encounter (Signed)
Please set her up to see EP - she may need a loop recorder

## 2013-05-31 NOTE — Telephone Encounter (Signed)
Please let patient know that since decreasing dose of amiodarone she has not had much slow heart rhythm and less junctional rhythm.  Please find out how she is feeling

## 2013-05-31 NOTE — Telephone Encounter (Signed)
Pt notified. Msg sent for pt to be set up with EP consult.

## 2013-06-09 ENCOUNTER — Institutional Professional Consult (permissible substitution): Payer: Medicare Other | Admitting: Internal Medicine

## 2013-06-10 ENCOUNTER — Encounter: Payer: Self-pay | Admitting: Cardiology

## 2013-06-10 ENCOUNTER — Ambulatory Visit (HOSPITAL_COMMUNITY): Payer: Medicare Other | Attending: Cardiology | Admitting: Cardiology

## 2013-06-10 ENCOUNTER — Other Ambulatory Visit (HOSPITAL_COMMUNITY): Payer: Medicare Other

## 2013-06-10 DIAGNOSIS — I4891 Unspecified atrial fibrillation: Secondary | ICD-10-CM

## 2013-06-10 DIAGNOSIS — I480 Paroxysmal atrial fibrillation: Secondary | ICD-10-CM

## 2013-06-10 DIAGNOSIS — I509 Heart failure, unspecified: Secondary | ICD-10-CM

## 2013-06-10 DIAGNOSIS — I43 Cardiomyopathy in diseases classified elsewhere: Secondary | ICD-10-CM | POA: Diagnosis not present

## 2013-06-10 DIAGNOSIS — R0789 Other chest pain: Secondary | ICD-10-CM

## 2013-06-10 DIAGNOSIS — I5032 Chronic diastolic (congestive) heart failure: Secondary | ICD-10-CM | POA: Diagnosis not present

## 2013-06-10 NOTE — Progress Notes (Signed)
Echo performed. 

## 2013-06-14 ENCOUNTER — Encounter: Payer: Self-pay | Admitting: Cardiology

## 2013-06-14 ENCOUNTER — Ambulatory Visit (INDEPENDENT_AMBULATORY_CARE_PROVIDER_SITE_OTHER): Payer: Medicare Other | Admitting: Cardiology

## 2013-06-14 VITALS — BP 178/81 | HR 84 | Ht 65.0 in | Wt 119.0 lb

## 2013-06-14 DIAGNOSIS — R55 Syncope and collapse: Secondary | ICD-10-CM | POA: Diagnosis not present

## 2013-06-14 DIAGNOSIS — I509 Heart failure, unspecified: Secondary | ICD-10-CM

## 2013-06-14 DIAGNOSIS — I4891 Unspecified atrial fibrillation: Secondary | ICD-10-CM | POA: Diagnosis not present

## 2013-06-14 DIAGNOSIS — I5032 Chronic diastolic (congestive) heart failure: Secondary | ICD-10-CM

## 2013-06-14 DIAGNOSIS — I10 Essential (primary) hypertension: Secondary | ICD-10-CM

## 2013-06-14 DIAGNOSIS — I480 Paroxysmal atrial fibrillation: Secondary | ICD-10-CM

## 2013-06-14 NOTE — Patient Instructions (Addendum)
Your physician has recommended you make the following change in your medication: 1. Put Eliquis on hold until further notice.  You have been referred to [facility]  (check out will schedule this appointment) [address] [place], [place] [address] [phone]   Your physician recommends that you schedule a follow-up appointment in: 3 weeks with Dr Radford Pax

## 2013-06-14 NOTE — Progress Notes (Signed)
[place], [place], [place]  [address] Phone: [phone] Fax:  [phone]  Date:  06/14/2013   ID:  Anaiya, Wisinski 1924-04-13, MRN [id number]  PCP:  Milagros Evener, MD  Cardiologist:  Fransico Him, MD     History of Present Illness: Cynthia Wright is a 80 y.o. 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  When I last saw her she complained of chest pain and indigestion at night.  She also having problems with severe exertional fatigue.  Her CP would improve after belching.  She was found to have junctional bradycardia and her carvedilol was stopped and amiodarone dose was decreased.  She wore an event monitor which showed a decrease in frequency of her junctional rhythm.  She also has problems with vertigo and has an appt with ENT.  She recently had a problem with Syncope in February. She was in the bathroom and got dizzy with no palpitations and blacked out.  She hurt her toe.  She went to her PCP and they thought it was from inner ear.  She had an episode prior to that one when she was in the bathroom and she thinks she got up too quick at 2am and passed out.  Both times she had fecal incontinence.  She denies any chest pain, SOB, DOE, LE edema or palpitations.   Wt Readings from Last 3 Encounters:  06/14/13 119 lb (53.978 kg)  05/11/13 117 lb 12.8 oz (53.434 kg)  04/19/13 116 lb 8 oz (52.844 kg)     Past Medical History  Diagnosis Date  . Hypertension   . Diabetes mellitus without complication   . Hyperlipidemia   . Atrial fibrillation     s/p DCCV 07/2012  . Chronic anticoagulation   . Dilated cardiomyopathy secondary to tachycardia 07/2012    2D echo 10/2012 showed EF 65% with grade II diastolic dysfunction  . Liver cyst   . Chronic diastolic CHF (congestive heart failure)     EF now 65% by echo 10/2012 with grade II diasotlid dysfunction    Current  Outpatient Prescriptions  Medication Sig Dispense Refill  . amiodarone (PACERONE) 200 MG tablet 1/2 tablet by mouth daily.  30 tablet  11  . amLODipine (NORVASC) 2.5 MG tablet Take 1 tablet (2.5 mg total) by mouth daily.  30 tablet  11  . apixaban (ELIQUIS) 2.5 MG TABS tablet Take 1 tablet (2.5 mg total) by mouth 2 (two) times daily.  60 tablet  11  . lisinopril (PRINIVIL,ZESTRIL) 40 MG tablet Take 1 tablet (40 mg total) by mouth daily.  30 tablet  11  . Multiple Vitamin (MULTIVITAMIN WITH MINERALS) TABS Take 1 tablet by mouth daily.      [name] Kitchen OVER THE COUNTER MEDICATION Take 200 mg by mouth at bedtime. Take 2 stool softener capsules before bedtime      . OVER THE COUNTER MEDICATION Place 3 drops into both eyes daily as needed (burning sensation in eyes). Ensure rewetting drops      . potassium chloride SA (K-DUR,KLOR-CON) 20 MEQ tablet Take two tablets every other day with lasix  60 tablet  11  . Probiotic Product (PROBIOTIC DAILY PO) Take 2 capsules by mouth daily.       . rosuvastatin (CRESTOR) 5 MG tablet Take 5 mg by mouth daily.      . sitaGLIPtin (JANUVIA) 100 MG tablet Take 100  mg by mouth daily.      [name] Kitchen zolpidem (AMBIEN) 5 MG tablet Take 5 mg by mouth at bedtime as needed for sleep.       No current facility-administered medications for this visit.    Allergies:    Allergies  Allergen Reactions  . Codeine Other (See Comments)    "Just get sick"  . Lortab [Hydrocodone-Acetaminophen] Other (See Comments)    "Just get sick"    Social History:  The patient  reports that she has quit smoking. She does not have any smokeless tobacco history on file. She reports that she drinks alcohol. She reports that she does not use illicit drugs.   Family History:  The patient's family history includes Heart disease in her father and mother.   ROS:  Please see the history of present illness.      All other systems reviewed and negative.   PHYSICAL EXAM: VS:  BP 178/81  Pulse 84  Ht 5\' 5"  (1.651  m)  Wt 119 lb (53.978 kg)  BMI 19.80 kg/m2 Well nourished, well developed, in no acute distress HEENT: normal Neck: no JVD Cardiac:  normal S1, S2; RRR; no murmur Lungs:  clear to auscultation bilaterally, no wheezing, rhonchi or rales Abd: soft, nontender, no hepatomegaly Ext: no edema Skin: warm and dry Neuro:  CNs 2-12 intact, no focal abnormalities noted  EKG:     Sinus bradycardia at 55bpm  ASSESSMENT AND PLAN:  1.  Vertigo unlikely to be cardiac in origin unless it is coming from her amio or Lasix 2.  HTN - mildly elevated - continue amlodipine/lisinopril  3.  PAF s/p TEE/DCCV  - stop apixaban due to recent syncopal episodes - continue amio 4. Tachycardia induced DCM now resolved  5. Fatigue improved after stopping coreg and cutting back dose of amio -LVF just checked on echo and was fine 6.  Syncope ? Etiology possible bradyarrhythmias.  She was not wearing the heart monitor at the time of her syncope.  One episode of syncope was while wearing a heart monitor.  It showed SB at 53bpm with no pauses or severe bradycarda.  Unlikely to have been etiology of syncope.  I am concerned that these could be seizures especially in setting of fecal incontinence.  If Neuro workup is normal then consider a loop recorder. - I have recommended that we hold her Eliquis for now due to her syncopal episodes and risk for hitting her head and bleed  Followup with me in 3 weeks  Signed, Fransico Him, MD 06/14/2013 11:37 AM

## 2013-06-15 ENCOUNTER — Ambulatory Visit: Payer: Self-pay | Admitting: Neurology

## 2013-06-20 ENCOUNTER — Telehealth: Payer: Self-pay | Admitting: Neurology

## 2013-06-20 ENCOUNTER — Ambulatory Visit: Payer: Self-pay | Admitting: Neurology

## 2013-06-20 NOTE — Telephone Encounter (Signed)
No show for new patient visit on 06/20/2013

## 2013-06-22 ENCOUNTER — Ambulatory Visit (INDEPENDENT_AMBULATORY_CARE_PROVIDER_SITE_OTHER): Payer: Medicare Other | Admitting: Neurology

## 2013-06-22 ENCOUNTER — Encounter: Payer: Self-pay | Admitting: Neurology

## 2013-06-22 VITALS — BP 170/59 | HR 58 | Ht 64.5 in | Wt 120.0 lb

## 2013-06-22 DIAGNOSIS — R404 Transient alteration of awareness: Secondary | ICD-10-CM | POA: Diagnosis not present

## 2013-06-22 DIAGNOSIS — R4020 Unspecified coma: Secondary | ICD-10-CM

## 2013-06-22 NOTE — Patient Instructions (Signed)
Overall you are doing fairly well but I do want to suggest a few things today:   As far as diagnostic testing:  1)Please schedule an EEG when you check out.   Please follow up with cardiology  Follow up as needed. Please call us with any interim questions, concerns, problems, updates or refill requests.   My clinical assistant and will answer any of your questions and relay your messages to me and also relay most of my messages to you.   Our phone number is [phone]. We also have an after hours call service for urgent matters and there is a physician on-call for urgent questions. For any emergencies you know to call 911 or go to the nearest emergency room

## 2013-06-22 NOTE — Progress Notes (Signed)
[facility]    Provider:  Dr Janann Colonel Referring Provider: Radene Ou Bill Salinas, MD Primary Care Physician:  Milagros Evener, MD  CC:  Seizure vs syncope  HPI:  Cynthia Wright is a 80 y.o. female here as a referral from Dr. Radene Ou for evaluation of episodes of LOC  Reports 2 periods of passing out. Initial episode was around 5 weeks ago, she recalls going into the bathroom, felt funny and slumped down to the floor. Reports she blacked out for a few seconds, realized she had lost control of her bowels. Denies any palpitations, no change in vision. 2nd event was similar in nature except she reports she awoke with some mild pain in her stomach. Again felt funny and slumped down to the floor. States she slumped down and appeared to stay in the same spot when unconscious. States she hurt her foot, believes she struck her foot on the door but does not recall this. No tongue biting.  Notes some episodes of feeling dizzy and light headed for the past few months. This occurs most frequently when going from sitting to standing. Currently working with her cardiologist to adjust her blood pressure medications and feels better with these changes. Otherwise healthy.   Cardiology notes 06/14/2013: Cynthia Wright is a 80 y.o. 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 When I last saw her she complained of chest pain and indigestion at night. She also having problems with severe exertional fatigue. Her CP would improve after belching. She was found to have junctional bradycardia and her carvedilol was stopped and amiodarone dose was decreased. She wore an event monitor which showed a decrease in frequency of her junctional rhythm. She also has problems with vertigo and has an appt with ENT. She recently had a problem with Syncope in February. She was in the bathroom and got dizzy with no  palpitations and blacked out. She hurt her toe. She went to her PCP and they thought it was from inner ear. She had an episode prior to that one when she was in the bathroom and she thinks she got up too quick at 2am and passed out. Both times she had fecal incontinence. She denies any chest pain, SOB, DOE, LE edema or palpitations.   Review of Systems: Out of a complete 14 system review, the patient complains of only the following symptoms, and all other reviewed systems are negative. + weight loss, fatigue, loss of vision, easy bruising, easy bleeding, memory loss, insomnia  History   Social History  . Marital Status: Single    Spouse Name: N/A    Number of Children: N/A  . Years of Education: N/A   Occupational History  . Not on file.   Social History Main Topics  . Smoking status: Former Research scientist (life sciences)  . Smokeless tobacco: Never Used  . Alcohol Use: 0.0 oz/week    1-2 Glasses of wine per week  . Drug Use: No  . Sexual Activity: Not on file   Other Topics Concern  . Not on file   Social History Narrative   Divorced, 3 children   Right handed   Some college   2 cups daily     Family History  Problem Relation Age of Onset  . Heart disease Mother   . Heart disease Father     Past Medical History  Diagnosis Date  . Hypertension   . Diabetes mellitus  without complication   . Hyperlipidemia   . Atrial fibrillation     s/p DCCV 07/2012  . Chronic anticoagulation   . Dilated cardiomyopathy secondary to tachycardia 07/2012    2D echo 10/2012 showed EF 65% with grade II diastolic dysfunction  . Liver cyst   . Chronic diastolic CHF (congestive heart failure)     EF now 65% by echo 10/2012 with grade II diasotlid dysfunction    Past Surgical History  Procedure Laterality Date  . Back surgery    . Tee without cardioversion N/A 08/09/2012    Procedure: TRANSESOPHAGEAL ECHOCARDIOGRAM (TEE);  Surgeon: Sueanne Margarita, MD;  Location: [facility] ENDOSCOPY;  Service: Cardiovascular;   Laterality: N/A;  talk to angie in [place].  . Cardioversion N/A 08/09/2012    Procedure: CARDIOVERSION;  Surgeon: Sueanne Margarita, MD;  Location: [facility] ENDOSCOPY;  Service: Cardiovascular;  Laterality: N/A;    Current Outpatient Prescriptions  Medication Sig Dispense Refill  . amiodarone (PACERONE) 200 MG tablet 1/2 tablet by mouth daily.  30 tablet  11  . amLODipine (NORVASC) 2.5 MG tablet Take 1 tablet (2.5 mg total) by mouth daily.  30 tablet  11  . lisinopril (PRINIVIL,ZESTRIL) 40 MG tablet Take 1 tablet (40 mg total) by mouth daily.  30 tablet  11  . Multiple Vitamin (MULTIVITAMIN WITH MINERALS) TABS Take 1 tablet by mouth daily.      [name] Kitchen OVER THE COUNTER MEDICATION Take 200 mg by mouth at bedtime. Take 2 stool softener capsules before bedtime      . OVER THE COUNTER MEDICATION Place 3 drops into both eyes daily as needed (burning sensation in eyes). Ensure rewetting drops      . potassium chloride SA (K-DUR,KLOR-CON) 20 MEQ tablet Take two tablets every other day with lasix  60 tablet  11  . Probiotic Product (PROBIOTIC DAILY PO) Take 2 capsules by mouth daily.       . rosuvastatin (CRESTOR) 5 MG tablet Take 5 mg by mouth daily.      . sitaGLIPtin (JANUVIA) 100 MG tablet Take 100 mg by mouth daily.      [name] Kitchen zolpidem (AMBIEN) 5 MG tablet Take 5 mg by mouth at bedtime as needed for sleep.       No current facility-administered medications for this visit.    Allergies as of 06/22/2013 - Review Complete 06/22/2013  Allergen Reaction Noted  . Codeine Other (See Comments) 08/02/2012  . Lortab [hydrocodone-acetaminophen] Other (See Comments) 08/02/2012    Vitals: BP 170/59  Pulse 58  Ht 5' 4.5" (1.638 m)  Wt 120 lb (54.432 kg)  BMI 20.29 kg/m2 Last Weight:  Wt Readings from Last 1 Encounters:  06/22/13 120 lb (54.432 kg)   Last Height:   Ht Readings from Last 1 Encounters:  06/22/13 5' 4.5" (1.638 m)     Physical exam: Exam: Gen: NAD, conversant Eyes: anicteric sclerae, moist  conjunctivae HENT: Atraumatic, oropharynx clear Neck: Trachea midline; supple,  Lungs: CTA, no wheezing, rales, rhonic                          CV: RRR, no MRG, no carotid bruits Abdomen: Soft, non-tender;  Extremities: No peripheral edema  Skin: Normal temperature, no rash,  Psych: Appropriate affect, pleasant  Neuro: Cynthia: AA&Ox3, appropriately interactive, normal affect   Speech: fluent w/o paraphasic error  Memory: good recent and remote recall  CN: PERRL, EOMI no nystagmus, no ptosis, sensation intact to LT V1-V3  bilat, face symmetric, no weakness, hearing grossly intact, palate elevates symmetrically, shoulder shrug 5/5 bilat,  tongue protrudes midline, no fasiculations noted.  Motor: normal bulk and tone Strength: 5/5  In all extremities  Coord: rapid alternating and point-to-point (FNF, HTS) movements intact.  Reflexes: symmetrical, bilat downgoing toes  Sens: LT intact in all extremities  Gait: posture, stance, stride and arm-swing normal. Tandem gait intact. Able to walk on heels and toes. Romberg absent.   Assessment:  After physical and neurologic examination, review of laboratory studies, imaging, neurophysiology testing and pre-existing records, assessment will be reviewed on the problem list.  Plan:  Treatment plan and additional workup will be reviewed under Problem List.  1)Loss of consciousness  Cynthia Wright is a very pleasant 80y/o woman presenting for initial evaluation of seizure vs syncope. She reports 2 episodes of LOC described as a slow slumping to the ground with brief LOC and loss of bowel control in both episodes. Of note, in the 2nd episode she also appears to have struck her foot on the door/wall but she has no recollection of this happening. Based on history it is difficulty to distinguish if this represents a seizure or syncopal episode. The slow fall to the ground with brief LOC is more consistent with a syncopal episode but the loss of bowel  control and striking her foot appear more consistent with a seizure though you can infrequently have loss of bowel/bladder and brief extremity jerking with syncopal episode. Will check EEG. Patient to follow up with cardiology for possible loop recorder.    Jim Like, DO  [facility] [address] [place], [place] [address]  Phone [phone] Fax [phone]

## 2013-06-30 ENCOUNTER — Ambulatory Visit (INDEPENDENT_AMBULATORY_CARE_PROVIDER_SITE_OTHER): Payer: Medicare Other

## 2013-06-30 DIAGNOSIS — R404 Transient alteration of awareness: Secondary | ICD-10-CM | POA: Diagnosis not present

## 2013-06-30 DIAGNOSIS — R4020 Unspecified coma: Secondary | ICD-10-CM

## 2013-06-30 NOTE — Procedures (Signed)
    History:  Cynthia Wright is an 80 year old patient with a history of a blackout episode while in the bathroom 5 weeks prior to this evaluation. The patient had a second event that was similar to the first. The patient had a funny sensation in the head, and slumped to the floor. The patient is being evaluated for these episodes of loss of consciousness.  This is a routine EEG. No skull defects are noted. Medications include amiodarone, amlodipine, lisinopril, multivitamins, potassium, Crestor, Ambien, and Januvia.   EEG classification: Normal awake  Description of the recording: The background rhythms of this recording consists of a fairly well modulated medium amplitude alpha rhythm of 9 Hz that is reactive to eye opening and closure. As the record progresses, the patient appears to remain in the waking state throughout the recording. Photic stimulation was performed, resulting in a bilateral and symmetric photic driving response. Hyperventilation was also performed, resulting in a minimal buildup of the background rhythm activities without significant slowing seen. At no time during the recording does there appear to be evidence of spike or spike wave discharges or evidence of focal slowing. EKG monitor shows no evidence of cardiac rhythm abnormalities with a heart rate of 60.  Impression: This is a normal EEG recording in the waking state. No evidence of ictal or interictal discharges are seen.

## 2013-07-04 NOTE — Progress Notes (Signed)
Quick Note:  Called and shared normal EEG results with patient, she Verbalized understanding ______

## 2013-07-07 ENCOUNTER — Ambulatory Visit (INDEPENDENT_AMBULATORY_CARE_PROVIDER_SITE_OTHER): Payer: Medicare Other | Admitting: Cardiology

## 2013-07-07 ENCOUNTER — Encounter: Payer: Self-pay | Admitting: Cardiology

## 2013-07-07 VITALS — BP 190/60 | HR 66 | Ht 64.0 in | Wt 119.0 lb

## 2013-07-07 DIAGNOSIS — I480 Paroxysmal atrial fibrillation: Secondary | ICD-10-CM

## 2013-07-07 DIAGNOSIS — I4891 Unspecified atrial fibrillation: Secondary | ICD-10-CM

## 2013-07-07 DIAGNOSIS — I498 Other specified cardiac arrhythmias: Secondary | ICD-10-CM | POA: Diagnosis not present

## 2013-07-07 DIAGNOSIS — R55 Syncope and collapse: Secondary | ICD-10-CM | POA: Diagnosis not present

## 2013-07-07 DIAGNOSIS — R079 Chest pain, unspecified: Secondary | ICD-10-CM | POA: Diagnosis not present

## 2013-07-07 DIAGNOSIS — I10 Essential (primary) hypertension: Secondary | ICD-10-CM | POA: Diagnosis not present

## 2013-07-07 DIAGNOSIS — R001 Bradycardia, unspecified: Secondary | ICD-10-CM

## 2013-07-07 LAB — CK TOTAL AND CKMB (NOT AT ARMC)
CK TOTAL: 111 U/L (ref 7–177)
CK, MB: 1.7 ng/mL (ref 0.3–4.0)
Relative Index: 1.5 (ref 0.0–4.0)

## 2013-07-07 LAB — TROPONIN I: Troponin I: <0.3 ng/mL (ref <0.30)

## 2013-07-07 MED ORDER — AMLODIPINE BESYLATE 5 MG PO TABS: 5.0000 mg | ORAL_TABLET | Freq: Every day | ORAL | Status: DC

## 2013-07-07 NOTE — Progress Notes (Signed)
[place] [place], [place]  [address] Phone: [phone] Fax:  [phone]  Date:  07/07/2013   ID:  Alecia, Doi 08/06/1924, MRN [id number]  PCP:  Milagros Evener, MD  Cardiologist:  Fransico Him, MD     History of Present Illness: Cynthia RUPPERT is a 80 y.o. 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 When I saw her a while back she complained of chest pain and indigestion at night. She also having problems with severe exertional fatigue. Her CP would improve after belching. She was found to have junctional bradycardia and her carvedilol was stopped and amiodarone dose was decreased. She wore an event monitor which showed a decrease in frequency of her junctional rhythm. She also has problems with vertigo and has an appt with ENT. She recently had a problem with Syncope in February. She was in the bathroom and got dizzy with no palpitations and blacked out. She hurt her toe. She went to her PCP and they thought it was from inner ear. She had an episode prior to that one when she was in the bathroom and she thinks she got up too quick at 2am and passed out. Both times she had fecal incontinence. Her Apixaban was stopped and she was referred to Neurology and EEG was normal.  Her Coreg was stopped and she says that her dizziness has improved.  She denies any more syncopal or presyncopal episodes.  She denies any SOB, DOE, LE edema or palpitations. She says that she has had some tightness in her chest over the past 3-4 days.  It comes and goes.  Her BP has been elevated at home.  There is no radiation of the tightness and no associated symptoms.      Wt Readings from Last 3 Encounters:  07/07/13 119 lb (53.978 kg)  06/22/13 120 lb (54.432 kg)  06/14/13 119 lb (53.978 kg)     Past Medical History  Diagnosis Date  . Hypertension   . Diabetes mellitus without  complication   . Hyperlipidemia   . Atrial fibrillation     s/p DCCV 07/2012  . Chronic anticoagulation   . Dilated cardiomyopathy secondary to tachycardia 07/2012    2D echo 10/2012 showed EF 65% with grade II diastolic dysfunction  . Liver cyst   . Chronic diastolic CHF (congestive heart failure)     EF now 65% by echo 10/2012 with grade II diasotlid dysfunction    Current Outpatient Prescriptions  Medication Sig Dispense Refill  . amiodarone (PACERONE) 200 MG tablet 1/2 tablet by mouth daily.  30 tablet  11  . amLODipine (NORVASC) 2.5 MG tablet Take 1 tablet (2.5 mg total) by mouth daily.  30 tablet  11  . lisinopril (PRINIVIL,ZESTRIL) 40 MG tablet Take 1 tablet (40 mg total) by mouth daily.  30 tablet  11  . Multiple Vitamin (MULTIVITAMIN WITH MINERALS) TABS Take 1 tablet by mouth daily.      [name] Kitchen OVER THE COUNTER MEDICATION Take 200 mg by mouth at bedtime. Take 2 stool softener capsules before bedtime      . OVER THE COUNTER MEDICATION Place 3 drops into both eyes daily as needed (burning sensation in eyes). Ensure rewetting drops      . potassium chloride SA (K-DUR,KLOR-CON) 20 MEQ tablet Take two tablets every other day with lasix  60 tablet  11  . Probiotic Product (  PROBIOTIC DAILY PO) Take 2 capsules by mouth daily.       . rosuvastatin (CRESTOR) 5 MG tablet Take 5 mg by mouth daily.      . sitaGLIPtin (JANUVIA) 100 MG tablet Take 100 mg by mouth daily.      [name] Kitchen zolpidem (AMBIEN) 5 MG tablet Take 5 mg by mouth at bedtime as needed for sleep.       No current facility-administered medications for this visit.    Allergies:    Allergies  Allergen Reactions  . Codeine Other (See Comments)    "Just get sick"  . Lortab [Hydrocodone-Acetaminophen] Other (See Comments)    "Just get sick"    Social History:  The patient  reports that she has quit smoking. She has never used smokeless tobacco. She reports that she drinks alcohol. She reports that she does not use illicit drugs.   Family  History:  The patient's family history includes Heart disease in her father and mother.   ROS:  Please see the history of present illness.      All other systems reviewed and negative.   PHYSICAL EXAM: VS:  BP 190/60  Pulse 66  Ht 5\' 4"  (1.626 m)  Wt 119 lb (53.978 kg)  BMI 20.42 kg/m2 Well nourished, well developed, in no acute distress HEENT: normal Neck: no JVD Cardiac:  normal S1, S2; RRR; no murmur Lungs:  clear to auscultation bilaterally, no wheezing, rhonchi or rales Abd: soft, nontender, no hepatomegaly Ext: no edema Skin: warm and dry Neuro:  CNs 2-12 intact, no focal abnormalities noted  EKG:   NSR with septal infarct    ASSESSMENT AND PLAN:  1. Vertigo - much improved after stopping coreg 2. HTN - poorly controlled - increase amlodipine to 5mg  daily - Check BP daily for a week and call with results - continue Lisinopril 3. PAF s/p TEE/DCCV  - Her Apixaban was stopped due to syncopal episodes in the interim - continue amio  4. Tachycardia induced DCM now resolved  5. Fatigue improved after stopping coreg and cutting back dose of amio  6. Syncope ? Etiology possible bradyarrhythmias. She was not wearing the heart monitor at the time of her syncope. One episode of syncope was while wearing a heart monitor. It showed SB at 53bpm with no pauses or severe bradycarda. Neurology evaluated her and felt these were syncopal episodes and not seizures.  EEG was normal.  Will refer to EP to consider a loop recorder.  7.  Chest pain with normal nuclear stress test a year ago.  It is nonexertional and EKG is normal.  I am suspicious that it may be due to poorly controlled HTN.   Leane Call - check troponin/CPK and MB - needs aggressive control of HTN   Followup with me in 3 weeks   Signed, Fransico Him, MD 07/07/2013 9:50 AM

## 2013-07-07 NOTE — Patient Instructions (Signed)
Your physician has recommended you make the following change in your medication: Increase Amlodipine to 5 Mg daily  Your physician recommends that you go to the lab today for a STAT CPK/Troponin/BM  Your physician has requested that you regularly monitor and record your blood pressure readings at home. Please use the same machine at the same time of day( 1 hour after taking BP medication) to check your readings and record fir one week and call us with the results. If your top number is consistently over 160 call us before the week is up.  Your physician has requested that you have a lexiscan myoview. For further information please visit [email or web address]. Please follow instruction sheet, as given.  Your physician recommends that you schedule a follow-up appointment in: 3 Weeks with Dr Radford Pax

## 2013-07-12 ENCOUNTER — Telehealth: Payer: Self-pay | Admitting: Internal Medicine

## 2013-07-12 NOTE — Telephone Encounter (Signed)
Wants to know why she needs to see Dr Rayann Heman or EP  I explained to her it may be to have the LINQ implanted and she says she is going to write down all the questions she has and bring to her follow up with Dr Radford Pax in a few weeks

## 2013-07-12 NOTE — Telephone Encounter (Signed)
New message          Pt would like for kelly to give her a call back. Pt did not disclose any other information.

## 2013-07-13 ENCOUNTER — Institutional Professional Consult (permissible substitution): Payer: Medicare Other | Admitting: Internal Medicine

## 2013-07-19 ENCOUNTER — Telehealth: Payer: Self-pay | Admitting: Cardiology

## 2013-07-19 MED ORDER — AMLODIPINE BESYLATE 10 MG PO TABS: 10.0000 mg | ORAL_TABLET | Freq: Every day | ORAL | Status: DC

## 2013-07-19 NOTE — Telephone Encounter (Signed)
BP still elevated - increase amlodipine to 10mg  daily and check BP daily for a week and call with results

## 2013-07-19 NOTE — Addendum Note (Signed)
Addended by: Golden Hurter D on: 07/19/2013 03:40 PM   Modules accepted: Orders, Medications

## 2013-07-19 NOTE — Telephone Encounter (Signed)
Returned call to patient Dr.Turner advised to increase Amlodipine to 10 mg daily.Advised to check B/P daily for 1 week and call back with readings.

## 2013-07-19 NOTE — Telephone Encounter (Signed)
New Message  Pt called to give BP readings for the past 5 days   04/15 147/70-63 Heart rate 04/16 174/79-77 Heart rate 04/17 143/70-65 Heart rate 04/18 158/63-65 Heart rate 04/19 154/74-61 Heart rate

## 2013-07-20 ENCOUNTER — Encounter (INDEPENDENT_AMBULATORY_CARE_PROVIDER_SITE_OTHER): Payer: Self-pay

## 2013-07-20 ENCOUNTER — Ambulatory Visit (HOSPITAL_COMMUNITY): Payer: Medicare Other | Attending: Cardiology | Admitting: Radiology

## 2013-07-20 ENCOUNTER — Encounter: Payer: Self-pay | Admitting: Cardiovascular Disease

## 2013-07-20 VITALS — BP 204/65 | HR 56 | Ht 64.0 in | Wt 120.0 lb

## 2013-07-20 DIAGNOSIS — R079 Chest pain, unspecified: Secondary | ICD-10-CM | POA: Diagnosis not present

## 2013-07-20 MED ORDER — REGADENOSON 0.4 MG/5ML IV SOLN
0.4000 mg | Freq: Once | INTRAVENOUS | Status: AC
  Administered 2013-07-20: 0.4 mg via INTRAVENOUS

## 2013-07-20 MED ORDER — TECHNETIUM TC 99M SESTAMIBI GENERIC - CARDIOLITE
30.0000 | Freq: Once | INTRAVENOUS | Status: AC | PRN
  Administered 2013-07-20: 30 via INTRAVENOUS

## 2013-07-20 MED ORDER — TECHNETIUM TC 99M SESTAMIBI GENERIC - CARDIOLITE
10.0000 | Freq: Once | INTRAVENOUS | Status: AC | PRN
  Administered 2013-07-20: 10 via INTRAVENOUS

## 2013-07-20 NOTE — Progress Notes (Signed)
[facility] [address] [place], [place] [address] [phone]    Cardiology Nuclear Med Study  Cynthia Wright is a 80 y.o. female     MRN : [id number]     DOB: May 22, 1924  Procedure Date: 07/20/2013  Nuclear Med Background Indication for Stress Test:  Evaluation for Ischemia History:  PAF, CHF, Echo 2014 EF 65%, MPI 2014 EF 67% Cardiac Risk Factors: Family History - CAD, History of Smoking, Hypertension, Lipids and NIDDM  Symptoms:  Chest Pain (last date of chest discomfort was yesterday), Dizziness and Syncope   Nuclear Pre-Procedure Caffeine/Decaff Intake:  None NPO After: 8:00pm   Lungs:  clear O2 Sat: 98% on room air. IV 0.9% NS with Angio Cath:  22g  IV Site: R Antecubital  IV Started by:  Crissie Figures, RN  Chest Size (in):  34 Cup Size: B  Height: 5\' 4"  (1.626 m)  Weight:  120 lb (54.432 kg)  BMI:  Body mass index is 20.59 kg/(m^2). Tech Comments:  N/A    Nuclear Med Study 1 or 2 day study: 1 day  Stress Test Type:  Lexiscan  Reading MD: N/A  Order Authorizing Provider:  Fransico Him, MD  Resting Radionuclide: Technetium 66m Sestamibi  Resting Radionuclide Dose: 11.0 mCi   Stress Radionuclide:  Technetium 68m Sestamibi  Stress Radionuclide Dose: 33.0 mCi           Stress Protocol Rest HR: 56 Stress HR: 75  Rest BP: 204/65 Stress BP: 155/59  Exercise Time (min): n/a METS: n/a           Dose of Adenosine (mg):  n/a Dose of Lexiscan: 0.4 mg  Dose of Atropine (mg): n/a Dose of Dobutamine: n/a mcg/kg/min (at max HR)  Stress Test Technologist: Glade Lloyd, BS-ES  Nuclear Technologist:  Charlton Amor, CNMT     Rest Procedure:  Myocardial perfusion imaging was performed at rest 45 minutes following the intravenous administration of Technetium 67m Sestamibi. Rest ECG: Normal sinus rhythm. Decrease anterior R wave progression.  Stress Procedure:  The patient received IV Lexiscan 0.4 mg over 15-seconds.  Technetium 57m  Sestamibi injected at 30-seconds.  Quantitative spect images were obtained after a 45 minute delay.  During the infusion of Lexiscan, the patient complained of SOB, stomach discomfort and dizziness.  These symptoms began to resolve in recovery.  Stress ECG: No significant change from baseline ECG  QPS Raw Data Images:  Normal; no motion artifact; normal heart/lung ratio. Stress Images:  Normal homogeneous uptake in all areas of the myocardium. Rest Images:  Normal homogeneous uptake in all areas of the myocardium. Subtraction (SDS):  No evidence of ischemia. Transient Ischemic Dilatation (Normal <1.22):  0.92 Lung/Heart Ratio (Normal <0.45):  0.30  Quantitative Gated Spect Images QGS EDV:  98 ml QGS ESV:  30 ml  Impression Exercise Capacity:  Lexiscan with no exercise. BP Response:  Normal blood pressure response. Clinical Symptoms:  Shortness of breath ECG Impression:  No significant ST segment change suggestive of ischemia. Comparison with Prior Nuclear Study: No significant change from previous study  Overall Impression:  Normal stress nuclear study. This is a low risk scan. There is no scar or ischemia. There is no change from the prior study of 2014.  LV Ejection Fraction: 70%.  LV Wall Motion:  Normal Wall Motion.  Carlena Bjornstad, MD

## 2013-08-02 ENCOUNTER — Telehealth: Payer: Self-pay | Admitting: Cardiology

## 2013-08-02 NOTE — Telephone Encounter (Signed)
New problem   Pt is calling in her BP reading and want to talk to nurse. Please call pt.

## 2013-08-02 NOTE — Telephone Encounter (Signed)
Patient was supposed to be referred to EP for loop recorder at last OV - did this happen?

## 2013-08-02 NOTE — Telephone Encounter (Signed)
BP readings for the week  5/1 BP  138/69 P 70 5/2 BP  141/73 P 79 5/3 BP  131/69 P 58 5/4 BP  137/60 P 63 5/5 BP  136/72 P 68  Pt is seeing Dr Radford Pax on Thursday, But today she realized she was having some swelling of her feet and ankles and wants to know why she might be having that.

## 2013-08-02 NOTE — Telephone Encounter (Signed)
The LE edema is probably from increasing the amlodipine.  Please have her decrease amlodpine to 2.5mg  daily and start Hytrin 1mg  nightly at bedtime.  Please have patient check her BP daily for a week and call with the results.

## 2013-08-03 NOTE — Telephone Encounter (Signed)
Yes she needs to get another EP appt

## 2013-08-03 NOTE — Telephone Encounter (Signed)
Pt cancelled 4/15 appt. Sending this back to you before I change meds so you are aware. Do you need me to update anything else besides medications?

## 2013-08-04 ENCOUNTER — Ambulatory Visit (INDEPENDENT_AMBULATORY_CARE_PROVIDER_SITE_OTHER): Payer: Medicare Other | Admitting: Cardiology

## 2013-08-04 ENCOUNTER — Telehealth: Payer: Self-pay | Admitting: Cardiology

## 2013-08-04 ENCOUNTER — Encounter: Payer: Self-pay | Admitting: Cardiology

## 2013-08-04 VITALS — BP 141/55 | HR 67 | Ht 64.0 in | Wt 120.0 lb

## 2013-08-04 DIAGNOSIS — R55 Syncope and collapse: Secondary | ICD-10-CM | POA: Diagnosis not present

## 2013-08-04 DIAGNOSIS — I5032 Chronic diastolic (congestive) heart failure: Secondary | ICD-10-CM

## 2013-08-04 DIAGNOSIS — I10 Essential (primary) hypertension: Secondary | ICD-10-CM

## 2013-08-04 DIAGNOSIS — I480 Paroxysmal atrial fibrillation: Secondary | ICD-10-CM

## 2013-08-04 DIAGNOSIS — I509 Heart failure, unspecified: Secondary | ICD-10-CM | POA: Diagnosis not present

## 2013-08-04 DIAGNOSIS — I4891 Unspecified atrial fibrillation: Secondary | ICD-10-CM | POA: Diagnosis not present

## 2013-08-04 MED ORDER — AMLODIPINE BESYLATE 5 MG PO TABS: 5.0000 mg | ORAL_TABLET | Freq: Every day | ORAL | Status: DC

## 2013-08-04 MED ORDER — HYDROCHLOROTHIAZIDE 25 MG PO TABS: 25.0000 mg | ORAL_TABLET | Freq: Every day | ORAL | Status: DC

## 2013-08-04 NOTE — Progress Notes (Signed)
[address] [place], [place]  [address] Phone: [phone] Fax:  [phone]  Date:  08/04/2013   ID:  Cynthia, Wright [birth year], MRN [id number]  PCP:  Milagros Evener, MD  Cardiologist:  Fransico Him, MD     History of Present Illness: Cynthia Wright is a 80 y.o. 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 When I saw her a while back she complained of chest pain and indigestion at night. She also having problems with severe exertional fatigue. Her CP would improve after belching. She was found to have junctional bradycardia and her carvedilol was stopped and amiodarone dose was decreased. She wore an event monitor which showed a decrease in frequency of her junctional rhythm. She also has problems with vertigo and is seeing ENT. She recently had a problem with Syncope in February. She was in the bathroom and got dizzy with no palpitations and blacked out. She hurt her toe. She went to her PCP and they thought it was from inner ear. She had an episode prior to that one when she was in the bathroom and she thinks she got up too quick at 2am and passed out. Both times she had fecal incontinence. Her Apixaban was stopped and she was referred to Neurology and EEG was normal. She denies any more syncopal or presyncopal episodes. She denies any SOB, DOE, LE edema or palpitations. Lexiscan myoview was normal.  I increased her amlodipine to 5mg  daily and started having swelling in her legs.       Wt Readings from Last 3 Encounters:  08/04/13 120 lb (54.432 kg)  07/20/13 120 lb (54.432 kg)  07/07/13 119 lb (53.978 kg)     Past Medical History  Diagnosis Date  . Hypertension   . Diabetes mellitus without complication   . Hyperlipidemia   . Atrial fibrillation     s/p DCCV 07/2012  . Chronic anticoagulation   . Dilated cardiomyopathy secondary to tachycardia 07/2012   2D echo 10/2012 showed EF 65% with grade II diastolic dysfunction  . Liver cyst   . Chronic diastolic CHF (congestive heart failure)     EF now 65% by echo 10/2012 with grade II diasotlid dysfunction  . Elevated cholesterol   . Benign diastolic hypertension   . Encounter for long-term (current) use of other medications   . Chronic systolic congestive heart failure     Current Outpatient Prescriptions  Medication Sig Dispense Refill  . amiodarone (PACERONE) 200 MG tablet 1/2 tablet by mouth daily.  30 tablet  11  . amLODipine (NORVASC) 10 MG tablet Take 1 tablet (10 mg total) by mouth daily.  30 tablet  6  . lisinopril (PRINIVIL,ZESTRIL) 40 MG tablet Take 1 tablet (40 mg total) by mouth daily.  30 tablet  11  . Multiple Vitamin (MULTIVITAMIN WITH MINERALS) TABS Take 1 tablet by mouth daily.      [name] Kitchen OVER THE COUNTER MEDICATION Take 200 mg by mouth at bedtime. Take 2 stool softener capsules before bedtime      . OVER THE COUNTER MEDICATION Place 3 drops into both eyes daily as needed (burning sensation in eyes). Ensure rewetting drops      . potassium chloride SA (K-DUR,KLOR-CON) 20 MEQ tablet Take two tablets every other day with lasix  60 tablet  11  . Probiotic Product (PROBIOTIC DAILY PO) Take 2 capsules by mouth daily.       [name] Kitchen  rosuvastatin (CRESTOR) 5 MG tablet Take 5 mg by mouth daily.      . sitaGLIPtin (JANUVIA) 100 MG tablet Take 100 mg by mouth daily.      [name] Kitchen zolpidem (AMBIEN) 5 MG tablet Take 5 mg by mouth at bedtime as needed for sleep.       No current facility-administered medications for this visit.    Allergies:    Allergies  Allergen Reactions  . Codeine Other (See Comments)    "Just get sick"  . Lortab [Hydrocodone-Acetaminophen] Other (See Comments)    "Just get sick"    Social History:  The patient  reports that she has quit smoking. She has never used smokeless tobacco. She reports that she drinks alcohol. She reports that she does not use illicit drugs.   Family  History:  The patient's family history includes CAD in her father and mother; Heart disease in her father and mother; Pancreatic cancer in her brother.   ROS:  Please see the history of present illness.      All other systems reviewed and negative.   PHYSICAL EXAM: VS:  BP 141/55  Pulse 67  Ht 5\' 4"  (1.626 m)  Wt 120 lb (54.432 kg)  BMI 20.59 kg/m2 Well nourished, well developed, in no acute distress HEENT: normal Neck: no JVD Cardiac:  normal S1, S2; RRR; no murmur Lungs:  clear to auscultation bilaterally, no wheezing, rhonchi or rales Abd: soft, nontender, no hepatomegaly Ext: no edema Skin: warm and dry Neuro:  CNs 2-12 intact, no focal abnormalities noted  ASSESSMENT AND PLAN:  1. Vertigo - resolved after stopping coreg  2. HTN - well controlled but now having LE edema after amlodipine dose increased - will decrease Amlodipine to 5mg  daily - continue Lisinopril  - add HCTZ 25mg  daily  - check BP daily for a week and call with results 3. PAF s/p TEE/DCCV - maintaining NSR - Her Apixaban was stopped due to syncopal episodes in the interim  - continue amio  4. Tachycardia induced DCM now resolved  5. Fatigue improved after stopping coreg and cutting back dose of amio  6. Syncope ? Etiology possible bradyarrhythmias. She was not wearing the heart monitor at the time of her syncope. One episode of syncope was while wearing a heart monitor. It showed SB at 53bpm with no pauses or severe bradycardia.  She was not on beta blockers at the time of her syncopal episodes.  Neurology evaluated her and felt these were syncopal episodes and not seizures. EEG was normal. Will refer to EP to consider a loop recorder.  7. Chest pain with normal nuclear stress test - pain has resolved.  Followup with me in 3 months      Signed, Fransico Him, MD 08/04/2013 11:47 AM

## 2013-08-04 NOTE — Telephone Encounter (Signed)
Went over for pt in office.

## 2013-08-04 NOTE — Patient Instructions (Signed)
Your physician has recommended you make the following change in your medication:   1. Start HCTZ 25 MG 1 tablet daily 2. Decrease Amlodipine to 5 MG 1 tablet daily  Your physician has requested that you regularly monitor and record your blood pressure readings at home. Please use the same machine at the same time of day to check your readings and record them for one week and call in the result.  You have been referred to Dr Osie Cheeks for Loop recorder Consult.  Your physician recommends that you schedule a follow-up appointment in: 3 months with Dr Radford Pax

## 2013-08-04 NOTE — Telephone Encounter (Signed)
New message     Seen today---have question about medications

## 2013-08-04 NOTE — Telephone Encounter (Signed)
Cleared up pts question regarding medication.

## 2013-08-11 ENCOUNTER — Telehealth: Payer: Self-pay | Admitting: Cardiology

## 2013-08-11 NOTE — Telephone Encounter (Signed)
TO Dr TUrner to advise. 

## 2013-08-11 NOTE — Telephone Encounter (Signed)
LVM for pt to return call

## 2013-08-11 NOTE — Telephone Encounter (Signed)
BP controlled except for 1 outlying reading.  Continue current meds

## 2013-08-11 NOTE — Telephone Encounter (Signed)
New message     Patient calling with blood pressure reading 135/69 today , 59 heart rate.   5/9 145/71, 68  5/10 135/66 , 63  5/11 155/101 , 70  5/12 143/72, 65

## 2013-08-12 NOTE — Telephone Encounter (Signed)
Pt is aware and agreed to treatment plan.

## 2013-08-23 ENCOUNTER — Telehealth: Payer: Self-pay | Admitting: Cardiology

## 2013-08-23 NOTE — Telephone Encounter (Signed)
New problem     Pt called wanting to speak to any Rn about this appt with Dr Hall Busing  5/27 ( ?EKG? discuss loop recorder/turner/sfa )  Pt canceled the apptointment on till then.

## 2013-08-23 NOTE — Telephone Encounter (Signed)
LMTCB

## 2013-08-25 ENCOUNTER — Ambulatory Visit: Payer: Medicare Other | Admitting: Internal Medicine

## 2013-08-26 ENCOUNTER — Encounter: Payer: Self-pay | Admitting: Internal Medicine

## 2013-09-02 ENCOUNTER — Telehealth: Payer: Self-pay | Admitting: *Deleted

## 2013-09-02 NOTE — Telephone Encounter (Signed)
She was never told to stop her Lisinopril

## 2013-09-02 NOTE — Telephone Encounter (Signed)
Pt states that she has on her bottle to stop lisinopril and she hasnt taken lisinopril in over a month. Should she be on this medication?

## 2013-09-02 NOTE — Telephone Encounter (Signed)
Cynthia Wright, this patient called wanting someone to go over her med list with her. She stated that she is a little disturbed by the fact that her print out from her office visit her does not match what her bottles have on them and she wants to be sure she is taking everything correctly. I am sending to you because most likely they were put into epic incorrectly. Thanks, MI

## 2013-09-02 NOTE — Telephone Encounter (Signed)
lmtrc

## 2013-09-05 NOTE — Telephone Encounter (Signed)
Pt notified. Pt will start back on lisinopril.

## 2013-10-28 ENCOUNTER — Other Ambulatory Visit: Payer: Self-pay

## 2013-10-28 DIAGNOSIS — I10 Essential (primary) hypertension: Secondary | ICD-10-CM

## 2013-10-28 MED ORDER — LISINOPRIL 40 MG PO TABS: 40.0000 mg | ORAL_TABLET | Freq: Every day | ORAL | Status: DC

## 2013-11-17 ENCOUNTER — Ambulatory Visit: Payer: Medicare Other | Admitting: Cardiology

## 2013-11-17 ENCOUNTER — Other Ambulatory Visit (HOSPITAL_COMMUNITY): Payer: Medicare Other

## 2013-11-17 ENCOUNTER — Telehealth: Payer: Self-pay | Admitting: Cardiology

## 2013-11-17 NOTE — Telephone Encounter (Signed)
New message    Patient called to cancel appt today due to having vertigo. Also patient stated she would like to get back in sooner to see Dr. Radford Pax.

## 2013-11-17 NOTE — Telephone Encounter (Signed)
Pt is aware she has been r/s for 9/8 at 2

## 2013-12-06 ENCOUNTER — Ambulatory Visit: Payer: Medicare Other | Admitting: Cardiology

## 2013-12-14 ENCOUNTER — Encounter: Payer: Self-pay | Admitting: Cardiology

## 2013-12-14 ENCOUNTER — Ambulatory Visit: Payer: Medicare Other | Admitting: Cardiology

## 2013-12-14 ENCOUNTER — Ambulatory Visit (INDEPENDENT_AMBULATORY_CARE_PROVIDER_SITE_OTHER): Payer: Medicare Other | Admitting: Cardiology

## 2013-12-14 VITALS — BP 138/68 | HR 67 | Ht 65.0 in | Wt 123.6 lb

## 2013-12-14 DIAGNOSIS — R000 Tachycardia, unspecified: Secondary | ICD-10-CM

## 2013-12-14 DIAGNOSIS — I10 Essential (primary) hypertension: Secondary | ICD-10-CM

## 2013-12-14 DIAGNOSIS — I498 Other specified cardiac arrhythmias: Secondary | ICD-10-CM | POA: Diagnosis not present

## 2013-12-14 DIAGNOSIS — I5032 Chronic diastolic (congestive) heart failure: Secondary | ICD-10-CM | POA: Diagnosis not present

## 2013-12-14 DIAGNOSIS — I509 Heart failure, unspecified: Secondary | ICD-10-CM

## 2013-12-14 DIAGNOSIS — R55 Syncope and collapse: Secondary | ICD-10-CM

## 2013-12-14 DIAGNOSIS — R001 Bradycardia, unspecified: Secondary | ICD-10-CM

## 2013-12-14 DIAGNOSIS — I480 Paroxysmal atrial fibrillation: Secondary | ICD-10-CM

## 2013-12-14 DIAGNOSIS — I43 Cardiomyopathy in diseases classified elsewhere: Secondary | ICD-10-CM

## 2013-12-14 DIAGNOSIS — I4891 Unspecified atrial fibrillation: Secondary | ICD-10-CM

## 2013-12-14 MED ORDER — ASPIRIN EC 81 MG PO TBEC: 81.0000 mg | DELAYED_RELEASE_TABLET | Freq: Every day | ORAL | Status: DC

## 2013-12-14 NOTE — Patient Instructions (Addendum)
Please reschedule appointment with Dr. Lovena Le for possible loop recorder.   Your physician has recommended you make the following change in your medication:   1. Start Aspirin 81 1 tablet daily.   Your physician recommends that you return for lab work today for [employer].  Your physician has recommended that you have a pulmonary function test. Pulmonary Function Tests are a group of tests that measure how well air moves in and out of your lungs.  Your physician wants you to follow-up in: 6 months with Dr. Radford Pax. You will receive a reminder letter in the mail two months in advance. If you don't receive a letter, please call our office to schedule the follow-up appointment.

## 2013-12-14 NOTE — Progress Notes (Addendum)
[address], [place], [place]  [address] Phone: [phone] Fax:  [phone]  Date:  12/14/2013   ID:  Sadeel, Fiddler [birth year], MRN [id number]  PCP:  Milagros Evener, MD  Cardiologist:  Fransico Him, MD     History of Present Illness: Cynthia Wright is a 80 y.o. 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  She has had several episodes of syncope over the past year and neuro workup was negative.  She was referred to EP for loop recorder but patient cancelled first appt with Dr. Lovena Le and then never showed up for second appt due to a family illness. She continues to have intermittent dizziness when going from sitting to standing but no syncope.  She denies any SOB, DOE, LE edema or palpitations.    Wt Readings from Last 3 Encounters:  12/14/13 123 lb 9.6 oz (56.065 kg)  08/04/13 120 lb (54.432 kg)  07/20/13 120 lb (54.432 kg)     Past Medical History  Diagnosis Date  . Hypertension   . Diabetes mellitus without complication   . Hyperlipidemia   . Atrial fibrillation     s/p DCCV 07/2012  . Chronic anticoagulation   . Dilated cardiomyopathy secondary to tachycardia 07/2012    2D echo 10/2012 showed EF 65% with grade II diastolic dysfunction  . Liver cyst   . Elevated cholesterol   . Benign diastolic hypertension   . Encounter for long-term (current) use of other medications   . Chronic diastolic CHF (congestive heart failure)     EF now 65% by echo 10/2012 with grade II diasotlid dysfunction  . Chronic systolic congestive heart failure     resolved    Current Outpatient Prescriptions  Medication Sig Dispense Refill  . amiodarone (PACERONE) 200 MG tablet 1/2 tablet by mouth daily.  30 tablet  11  . amLODipine (NORVASC) 5 MG tablet Take 1 tablet (5 mg total) by mouth daily.  30 tablet  11  . hydrochlorothiazide (HYDRODIURIL) 25 MG tablet Take 1  tablet (25 mg total) by mouth daily.  30 tablet  11  . lisinopril (PRINIVIL,ZESTRIL) 40 MG tablet Take 1 tablet (40 mg total) by mouth daily.  30 tablet  6  . Multiple Vitamin (MULTIVITAMIN WITH MINERALS) TABS Take 1 tablet by mouth daily.      [name] Kitchen OVER THE COUNTER MEDICATION Take 200 mg by mouth at bedtime. Take 2 stool softener capsules before bedtime      . OVER THE COUNTER MEDICATION Place 3 drops into both eyes daily as needed (burning sensation in eyes). Ensure rewetting drops      . potassium chloride SA (K-DUR,KLOR-CON) 20 MEQ tablet Take two tablets every other day      . Probiotic Product (PROBIOTIC DAILY PO) Take 2 capsules by mouth daily.       . rosuvastatin (CRESTOR) 5 MG tablet Take 5 mg by mouth daily.      . sitaGLIPtin (JANUVIA) 100 MG tablet Take 100 mg by mouth daily.      [name] Kitchen zolpidem (AMBIEN) 5 MG tablet Take 5 mg by mouth at bedtime as needed for sleep.      [name] Kitchen aspirin EC 81 MG tablet Take 1 tablet (81 mg total) by mouth daily.  90 tablet  3   No current facility-administered medications for this visit.    Allergies:    Allergies  Allergen  Reactions  . Codeine Other (See Comments)    "Just get sick"  . Lortab [Hydrocodone-Acetaminophen] Other (See Comments)    "Just get sick"    Social History:  The patient  reports that she has quit smoking. She has never used smokeless tobacco. She reports that she drinks alcohol. She reports that she does not use illicit drugs.   Family History:  The patient's family history includes CAD in her father and mother; Heart disease in her father and mother; Pancreatic cancer in her brother.   ROS:  Please see the history of present illness.      All other systems reviewed and negative.   PHYSICAL EXAM: VS:  BP 138/68  Pulse 67  Ht 5\' 5"  (1.651 m)  Wt 123 lb 9.6 oz (56.065 kg)  BMI 20.57 kg/m2 Well nourished, well developed, in no acute distress HEENT: normal Neck: no JVD Cardiac:  normal S1, S2; RRR; no murmuroccsaional  ectopy Lungs:  clear to auscultation bilaterally, no wheezing, rhonchi or rales Abd: soft, nontender, no hepatomegaly Ext: no edema Skin: warm and dry Neuro:  CNs 2-12 intact, no focal abnormalities noted  EKG:  Sinus bradycardia at 59bpm with occasional PAC's, anteroseptal infarct and nonspecific ST abnormality  ASSESSMENT AND PLAN:  1. Vertigo - resolved after stopping coreg  2. HTN  - continue Lisinopril/amlodipine/HCTZ - check CMET 3. PAF s/p TEE/DCCV - maintaining NSR  - Her Apixaban was stopped due to syncopal episodes in the interim  - continue amio  - PFT's for chronic amio monitoring 4. Tachycardia induced DCM now resolved  5. Fatigue improved after stopping coreg and cutting back dose of amio  6. Syncope ? Etiology possible bradyarrhythmias vs. orthostasis. She was not wearing the heart monitor at the time of on of her episodes of syncope. One episode of syncope was while wearing a heart monitor. It showed SB at 53bpm with no pauses or severe bradycardia. She was not on beta blockers at the time of her syncopal episodes. Neurology evaluated her and felt these were syncopal episodes and no seizures.  EEG was normal. She was referred to EP to consider a loop recorder but rescheduled her appt and then did not show up due to family illness. I would like to reschedule that appt even though she has not had any further syncope since May.  She is currently off anticoagulation for afib due to recurrent syncope and I would like to get her back on it due to her elevated CHADS VASC score of 7.  In the interim she will be on ASA 81mg  daily.  Will see if EP feels loop recorder should be pursued or continue to follow and place back in anticoagulation (Apixiban). 7. Chest pain with normal nuclear stress test - pain has resolved.  Followup with me in 6 months  Signed, Fransico Him, MD 12/14/2013 9:10 PM

## 2013-12-15 ENCOUNTER — Encounter: Payer: Self-pay | Admitting: General Surgery

## 2013-12-15 LAB — COMPREHENSIVE METABOLIC PANEL
ALT: 18 U/L (ref 0–35)
AST: 22 U/L (ref 0–37)
Albumin: 3.9 g/dL (ref 3.5–5.2)
Alkaline Phosphatase: 60 U/L (ref 39–117)
BILIRUBIN TOTAL: 0.7 mg/dL (ref 0.2–1.2)
BUN: 18 mg/dL (ref 6–23)
CO2: 27 mEq/L (ref 19–32)
CREATININE: 1 mg/dL (ref 0.4–1.2)
Calcium: 9.2 mg/dL (ref 8.4–10.5)
Chloride: 107 mEq/L (ref 96–112)
GFR: 57.74 mL/min — AB (ref >60.00)
GLUCOSE: 111 mg/dL — AB (ref 70–99)
Potassium: 4.1 mEq/L (ref 3.5–5.1)
Sodium: 140 mEq/L (ref 135–145)
Total Protein: 7.2 g/dL (ref 6.0–8.3)

## 2014-01-04 ENCOUNTER — Encounter: Payer: Self-pay | Admitting: Internal Medicine

## 2014-01-04 ENCOUNTER — Ambulatory Visit (INDEPENDENT_AMBULATORY_CARE_PROVIDER_SITE_OTHER): Payer: Medicare Other | Admitting: Internal Medicine

## 2014-01-04 VITALS — BP 152/70 | HR 62 | Ht 65.0 in | Wt 124.1 lb

## 2014-01-04 DIAGNOSIS — I5032 Chronic diastolic (congestive) heart failure: Secondary | ICD-10-CM | POA: Diagnosis not present

## 2014-01-04 DIAGNOSIS — I10 Essential (primary) hypertension: Secondary | ICD-10-CM | POA: Diagnosis not present

## 2014-01-04 DIAGNOSIS — R001 Bradycardia, unspecified: Secondary | ICD-10-CM | POA: Diagnosis not present

## 2014-01-04 DIAGNOSIS — R55 Syncope and collapse: Secondary | ICD-10-CM

## 2014-01-04 DIAGNOSIS — I480 Paroxysmal atrial fibrillation: Secondary | ICD-10-CM

## 2014-01-04 MED ORDER — HYDROCHLOROTHIAZIDE 25 MG PO TABS: 12.5000 mg | ORAL_TABLET | Freq: Every day | ORAL | Status: DC

## 2014-01-04 MED ORDER — APIXABAN 2.5 MG PO TABS
2.5000 mg | ORAL_TABLET | Freq: Two times a day (BID) | ORAL | Status: DC
Start: 2014-01-04 — End: 2014-12-26

## 2014-01-04 NOTE — Patient Instructions (Signed)
Your physician recommends that you schedule a follow-up appointment as needed  Your physician has recommended you make the following change in your medication:  1) Stop Aspirin 2) Start Eliquis 2.5mg  twice daily 3) Decrease HCTZ to 12.5mg  daily  AVOID SALT and drink lots of fluids

## 2014-01-04 NOTE — Progress Notes (Signed)
Primary Care Physician: Milagros Evener, MD Referring Physician:  Dr Yetta Numbers Cynthia Wright is a 80 y.o. 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 It showed SB at 53bpm with no pauses or severe bradycardia. Both episodes were the bathroom when voiding at night.   Neurology evaluated her and felt these were syncopal episodes and no seizures. EEG was normal. She was referred to EP to consider a loop recorder but rescheduled her appt and then did not show up due to family illness. Lasix was stopped and Amiodarone was reduced in dose to 100 mg a day. BB was stopped priori to syncope. [name] KitchenNOAC was stopped due to falls and she was placed on a baby asa.  She wore a 30 day loop monitor without any significant arrhythmias.She has not had any further syncope since February. She does notice dizziness with position change and dangles on the side of the bed now wihen  she has to get up in the middle of the night to go to the bathroom. Does not notice any significant palpitations.CHADS2VA2Sc of at least 6.  Today, she denies symptoms of palpitations, chest pain, shortness of breath, orthopnea, PND, lower extremity edema, dizziness, presyncope, syncope, or neurologic sequela. The patient is tolerating medications without difficulties and is otherwise without complaint today.   Past Medical History  Diagnosis Date  . Hypertension   . Diabetes mellitus without complication   . Hyperlipidemia   . Atrial fibrillation     s/p DCCV 07/2012  . Chronic anticoagulation   . Dilated cardiomyopathy secondary to tachycardia 07/2012    2D echo 10/2012 showed EF 65% with grade II diastolic dysfunction  . Liver cyst   . Elevated cholesterol   . Benign diastolic hypertension   . Encounter for long-term (current) use of  other medications   . Chronic diastolic CHF (congestive heart failure)     EF now 65% by echo 10/2012 with grade II diasotlid dysfunction  . Chronic systolic congestive heart failure     resolved   Past Surgical History  Procedure Laterality Date  . Back surgery      herniated disc  . Tee without cardioversion N/A 08/09/2012    Procedure: TRANSESOPHAGEAL ECHOCARDIOGRAM (TEE);  Surgeon: Sueanne Margarita, MD;  Location: [facility] ENDOSCOPY;  Service: Cardiovascular;  Laterality: N/A;  talk to angie in [place].  . Cardioversion N/A 08/09/2012    Procedure: CARDIOVERSION;  Surgeon: Sueanne Margarita, MD;  Location: [facility] ENDOSCOPY;  Service: Cardiovascular;  Laterality: N/A;    Current Outpatient Prescriptions  Medication Sig Dispense Refill  . amiodarone (PACERONE) 200 MG tablet 1/2 tablet by mouth daily.  30 tablet  11  . amLODipine (NORVASC) 5 MG tablet Take 1 tablet (5 mg total) by mouth daily.  30 tablet  11  . hydrochlorothiazide (HYDRODIURIL) 25 MG tablet Take 0.5 tablets (12.5 mg total) by mouth daily.  30 tablet  11  . lisinopril (PRINIVIL,ZESTRIL) 40 MG tablet Take 1 tablet (40 mg total) by mouth daily.  30 tablet  6  . Multiple Vitamin (MULTIVITAMIN WITH MINERALS) TABS Take 1 tablet by mouth daily.      [name] Kitchen OVER THE COUNTER MEDICATION Take 2 stool softener capsules before bedtime      . OVER THE COUNTER MEDICATION Place 3 drops  into both eyes daily as needed (burning sensation in eyes). Ensure rewetting drops      . potassium chloride SA (K-DUR,KLOR-CON) 20 MEQ tablet Take two tablets every other day      . Probiotic Product (PROBIOTIC DAILY PO) Take 2 capsules by mouth daily.       . rosuvastatin (CRESTOR) 5 MG tablet Take 5 mg by mouth daily.      . sitaGLIPtin (JANUVIA) 100 MG tablet Take 100 mg by mouth daily.      [name] Kitchen zolpidem (AMBIEN) 5 MG tablet Take 2.5-5 mg by mouth at bedtime as needed for sleep.       [name] Kitchen apixaban (ELIQUIS) 2.5 MG TABS tablet Take 1 tablet (2.5 mg total) by mouth 2 (two) times  daily.  60 tablet  11   No current facility-administered medications for this visit.    Allergies  Allergen Reactions  . Codeine Other (See Comments)    "Just get sick"  . Lortab [Hydrocodone-Acetaminophen] Other (See Comments)    "Just get sick"    History   Social History  . Marital Status: Single    Spouse Name: N/A    Number of Children: N/A  . Years of Education: N/A   Occupational History  . Not on file.   Social History Main Topics  . Smoking status: Former Research scientist (life sciences)  . Smokeless tobacco: Never Used  . Alcohol Use: 0.0 oz/week    1-2 Glasses of wine per week  . Drug Use: No  . Sexual Activity: Not on file   Other Topics Concern  . Not on file   Social History Narrative   Divorced, 3 children   Right handed   Some college   2 cups daily     Family History  Problem Relation Age of Onset  . Heart disease Mother   . Heart disease Father   . CAD Mother   . CAD Father   . Pancreatic cancer Brother     ROS- All systems are reviewed and negative except as per the HPI above  Physical Exam: Filed Vitals:   01/04/14 1205  BP: 152/70  Pulse: 62  Height: 5\' 5"  (1.651 m)  Weight: 124 lb 1.9 oz (56.3 kg)    GEN- The patient is well appearing, alert and oriented x 3 today.   Head- normocephalic, atraumatic Eyes-  Sclera clear, conjunctiva pink Ears- hearing intact Oropharynx- clear Neck- supple,  Lungs- Clear to ausculation bilaterally, normal work of breathing Heart- Mildly irregular rate and rhythm, no murmurs, rubs or gallops, PMI not laterally displaced GI- soft, NT, ND, + BS Extremities- no clubbing, cyanosis, or edema MS- no significant deformity or atrophy Skin- no rash or lesion Psych- euthymic mood, full affect Neuro- strength and sensation are intact  EKG- Atrial fib rate controlled at 62 bpm. Echo 06/10/13-EF 60-65%, mild to mod MR Myoview 4/15-  Normal, no ischemia. 30 day monitor reviewed.  Assessment and Plan: 1. Syncope No evidence  for significant arrythmia by monitor with 2nd episode documented brady at  52 bpm without significant pauses or profound brady. Possibly driven by hypotension. Offered pt ILR but she defers due to no continued issues with syncope since medication adjustment. Decrease HCTZ to 12.5 mg a day. Avoid salt. Hydrate  2. CHADS2VA2Sc score of at least 6. Stop asa and resume Eliquis at renal dose of 2.5 mg bid. Pt encouraged to use fall precaution and to dangle for a few minutes when getting up at night.  3. PAF  No change in treatment.  4. F/u as scheduled with Dr. Radford Pax and return here as needed.  I have seen, examined the patient, and reviewed the above assessment and plan with Roderic Palau NP.  Changes to above are made where necessary.  Pt with postural syncope in February now resolved.  I suspect that hypotension was the cause.  Would decrease hctz and consider stopping this medicine ultimately as she continues to have occasional postural dizziness.  I think that an arrhythmic event is unlikely.  I did offer ILR implant today however she is not interested at this time.  Should she have recurrent syncope and desire monitoring, I would favor ILR over 30 day monitoring. Stop ASA and restart eliquis as above.  She understands risks with this medicine. Return to see me as needed  Co Sign: Thompson Grayer, MD 01/04/2014 11:29 PM

## 2014-01-05 ENCOUNTER — Telehealth: Payer: Self-pay | Admitting: Internal Medicine

## 2014-01-05 MED ORDER — AMLODIPINE BESYLATE 10 MG PO TABS: 5.0000 mg | ORAL_TABLET | Freq: Every day | ORAL | Status: DC

## 2014-01-05 NOTE — Telephone Encounter (Signed)
She never decreased her Amlodipine as directed by Dr Radford Pax back in May.  She is still taking 10mg  daily.  iIlet her know to continue as she has been and I will forward to Dr Radford Pax and Quillian Quince for clarification  as what to do from this point forward.  She will continue 10mg  daily

## 2014-01-05 NOTE — Telephone Encounter (Signed)
New message     Pt saw Dr Rayann Heman yesterday.  She has a question about amlodipine.  She takes 10mg  daily and the paper given to her  says take 5mg  daily.  Please call to clarify dosage

## 2014-01-05 NOTE — Telephone Encounter (Signed)
Please find out what her BP has been running

## 2014-01-06 NOTE — Telephone Encounter (Signed)
lmtrc

## 2014-01-09 DIAGNOSIS — E780 Pure hypercholesterolemia: Secondary | ICD-10-CM | POA: Diagnosis not present

## 2014-01-09 DIAGNOSIS — I10 Essential (primary) hypertension: Secondary | ICD-10-CM | POA: Diagnosis not present

## 2014-01-09 DIAGNOSIS — E119 Type 2 diabetes mellitus without complications: Secondary | ICD-10-CM | POA: Diagnosis not present

## 2014-01-09 DIAGNOSIS — Z23 Encounter for immunization: Secondary | ICD-10-CM | POA: Diagnosis not present

## 2014-01-09 DIAGNOSIS — I480 Paroxysmal atrial fibrillation: Secondary | ICD-10-CM | POA: Diagnosis not present

## 2014-01-10 ENCOUNTER — Other Ambulatory Visit: Payer: Self-pay | Admitting: General Surgery

## 2014-01-10 ENCOUNTER — Ambulatory Visit (INDEPENDENT_AMBULATORY_CARE_PROVIDER_SITE_OTHER): Payer: Medicare Other | Admitting: Internal Medicine

## 2014-01-10 DIAGNOSIS — I480 Paroxysmal atrial fibrillation: Secondary | ICD-10-CM

## 2014-01-10 LAB — PULMONARY FUNCTION TEST
DL/VA % PRED: 102 %
DL/VA: 4.94 ml/min/mmHg/L
DLCO unc % pred: 84 %
DLCO unc: 20.41 ml/min/mmHg
FEF 25-75 PRE: 1.12 L/s
FEF 25-75 Post: 1.59 L/sec
FEF2575-%CHANGE-POST: 42 %
FEF2575-%PRED-POST: 152 %
FEF2575-%PRED-PRE: 107 %
FEV1-%CHANGE-POST: 14 %
FEV1-%Pred-Post: 105 %
FEV1-%Pred-Pre: 92 %
FEV1-POST: 1.79 L
FEV1-Pre: 1.56 L
FEV1FVC-%Change-Post: 0 %
FEV1FVC-%PRED-PRE: 102 %
FEV6-%Change-Post: 13 %
FEV6-%Pred-Post: 111 %
FEV6-%Pred-Pre: 98 %
FEV6-POST: 2.41 L
FEV6-Pre: 2.12 L
FEV6FVC-%PRED-PRE: 106 %
FEV6FVC-%Pred-Post: 106 %
FVC-%Change-Post: 13 %
FVC-%PRED-POST: 105 %
FVC-%Pred-Pre: 92 %
FVC-PRE: 2.12 L
FVC-Post: 2.41 L
POST FEV1/FVC RATIO: 74 %
PRE FEV1/FVC RATIO: 74 %
Post FEV6/FVC ratio: 100 %
Pre FEV6/FVC Ratio: 100 %
RV % pred: 92 %
RV: 2.33 L
TLC % pred: 93 %
TLC: 4.71 L

## 2014-01-10 MED ORDER — AMLODIPINE BESYLATE 10 MG PO TABS: 10.0000 mg | ORAL_TABLET | Freq: Every day | ORAL | Status: DC

## 2014-01-10 NOTE — Telephone Encounter (Signed)
Pt states that her BPs have normal she has not had any complaints of her Bp being to high or low. She wants to know if she should continue the 10 Mg or decrease down to 5 Mg

## 2014-01-10 NOTE — Telephone Encounter (Signed)
Med list updated for pt. Pt was aware to continue with 10 MG dosage of Amlodipine.

## 2014-01-10 NOTE — Progress Notes (Signed)
PFT done today. 

## 2014-01-10 NOTE — Telephone Encounter (Signed)
Continue on amlodipine 10mg  daily

## 2014-01-13 ENCOUNTER — Encounter: Payer: Self-pay | Admitting: Cardiology

## 2014-02-10 ENCOUNTER — Telehealth: Payer: Self-pay | Admitting: Cardiology

## 2014-02-10 NOTE — Telephone Encounter (Signed)
Instructed patient to see her PCP. Patient agrees with treatment plan. 

## 2014-02-10 NOTE — Telephone Encounter (Signed)
She needs to go see her PCP is she is feeling bad

## 2014-02-10 NOTE — Telephone Encounter (Signed)
New message           Pt would like for katy to give her a call / pt did not disclose any other info

## 2014-02-10 NOTE — Telephone Encounter (Signed)
Patient st she had an infected tooth extracted Monday.  Pt st she still has infection in her mouth and has been on amoxicillin and tramadol. She called the office who extracted her tooth yesterday and they discontinued her tramadol since she was c/o "dizziness and stumbling around." Pt st she still feels a little dizzy.  BP this AM was 125/69. HR 72.   Pt requests Dr. Radford Pax know her situation and asks for recommendations.

## 2014-02-13 DIAGNOSIS — R7989 Other specified abnormal findings of blood chemistry: Secondary | ICD-10-CM | POA: Diagnosis not present

## 2014-02-13 DIAGNOSIS — R42 Dizziness and giddiness: Secondary | ICD-10-CM | POA: Diagnosis not present

## 2014-03-08 ENCOUNTER — Other Ambulatory Visit: Payer: Self-pay

## 2014-03-08 MED ORDER — POTASSIUM CHLORIDE CRYS ER 20 MEQ PO TBCR: EXTENDED_RELEASE_TABLET | ORAL | Status: DC

## 2014-04-05 ENCOUNTER — Encounter: Payer: Self-pay | Admitting: Cardiology

## 2014-05-04 ENCOUNTER — Telehealth: Payer: Self-pay | Admitting: Cardiology

## 2014-05-04 MED ORDER — AMIODARONE HCL 200 MG PO TABS: ORAL_TABLET | ORAL | Status: DC

## 2014-05-04 NOTE — Telephone Encounter (Signed)
I spoke with the patient. She states that the pharmacy has note refilled her automatic refill on her amiodarone. Her last RX was initiated 05/2013. I advised that it has most likely expired and I will send refills to the pharmacy for her. Confirmed she is on amiodarone 200 mg 1/2 tablet by mouth once daily- [facility] on [address].

## 2014-05-04 NOTE — Telephone Encounter (Signed)
New Message  Pt has question about amiodarone prescription please call back and discuss.

## 2014-05-21 ENCOUNTER — Other Ambulatory Visit: Payer: Self-pay | Admitting: Cardiology

## 2014-05-27 ENCOUNTER — Other Ambulatory Visit: Payer: Self-pay | Admitting: Cardiology

## 2014-06-12 NOTE — Progress Notes (Signed)
Cardiology Office Note   Date:  06/13/2014   ID:  Cynthia Wright, DOB [birth year], MRN [id number]  PCP:  Milagros Evener, MD  Cardiologist:   Sueanne Margarita, MD   Chief Complaint  Patient presents with  . Hypertension  . Loss of Consciousness  . Atrial Fibrillation      History of Present Illness: Cynthia Wright is a 81 y.o. 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 She has had several episodes of syncope over the past year and neuro workup was negative. She was referred to EP for loop recorder but patient cancelled first appt with Dr. Lovena Le and then never showed up for second appt due to a family illness. She is doing much better and has not had any syncope.  She has not really had any dizziness either. She denies any SOB, DOE or palpitations.  She rarely will have some mild LE edema.      Past Medical History  Diagnosis Date  . Hypertension   . Diabetes mellitus without complication   . Hyperlipidemia   . Atrial fibrillation     s/p DCCV 07/2012  . Chronic anticoagulation   . Dilated cardiomyopathy secondary to tachycardia 07/2012    2D echo 10/2012 showed EF 65% with grade II diastolic dysfunction  . Liver cyst   . Elevated cholesterol   . Benign diastolic hypertension   . Encounter for long-term (current) use of other medications   . Chronic diastolic CHF (congestive heart failure)     EF now 65% by echo 10/2012 with grade II diasotlid dysfunction  . Chronic systolic congestive heart failure     resolved    Past Surgical History  Procedure Laterality Date  . Back surgery      herniated disc  . Tee without cardioversion N/A 08/09/2012    Procedure: TRANSESOPHAGEAL ECHOCARDIOGRAM (TEE);  Surgeon: Sueanne Margarita, MD;  Location: [facility] ENDOSCOPY;  Service: Cardiovascular;  Laterality: N/A;  talk to angie in [place].  . Cardioversion N/A 08/09/2012    Procedure:  CARDIOVERSION;  Surgeon: Sueanne Margarita, MD;  Location: [facility] ENDOSCOPY;  Service: Cardiovascular;  Laterality: N/A;     Current Outpatient Prescriptions  Medication Sig Dispense Refill  . amiodarone (PACERONE) 200 MG tablet 1/2 tablet by mouth daily. 30 tablet 6  . amLODipine (NORVASC) 10 MG tablet TAKE 1 TABLET (10 MG TOTAL) BY MOUTH DAILY. 30 tablet 11  . apixaban (ELIQUIS) 2.5 MG TABS tablet Take 1 tablet (2.5 mg total) by mouth 2 (two) times daily. 60 tablet 11  . hydrochlorothiazide (HYDRODIURIL) 25 MG tablet Take 0.5 tablets (12.5 mg total) by mouth daily. 30 tablet 11  . lisinopril (PRINIVIL,ZESTRIL) 40 MG tablet TAKE 1 TABLET (40 MG TOTAL) BY MOUTH DAILY. 30 tablet 6  . Multiple Vitamin (MULTIVITAMIN WITH MINERALS) TABS Take 1 tablet by mouth daily.    [name] Kitchen OVER THE COUNTER MEDICATION Take 2 stool softener capsules before bedtime    . OVER THE COUNTER MEDICATION Place 3 drops into both eyes daily as needed (burning sensation in eyes). Ensure rewetting drops    . potassium chloride SA (K-DUR,KLOR-CON) 20 MEQ tablet Take two tablets every other day 30 tablet 6  . Probiotic Product (PROBIOTIC DAILY PO) Take 2 capsules by mouth daily.     . rosuvastatin (CRESTOR) 5 MG tablet Take 5 mg by mouth daily.    . sitaGLIPtin (JANUVIA) 100 MG  tablet Take 100 mg by mouth daily.    [name] Kitchen zolpidem (AMBIEN) 5 MG tablet Take 2.5-5 mg by mouth at bedtime as needed for sleep.      No current facility-administered medications for this visit.    Allergies:   Codeine and Lortab    Social History:  The patient  reports that she has quit smoking. She has never used smokeless tobacco. She reports that she drinks alcohol. She reports that she does not use illicit drugs.   Family History:  The patient's family history includes CAD in her father and mother; Heart disease in her father and mother; Pancreatic cancer in her brother.    ROS:  Please see the history of present illness.   Otherwise, review of systems are  positive for none.   All other systems are reviewed and negative.    PHYSICAL EXAM: VS:  BP 140/56 mmHg  Pulse 62  Ht 5\' 5"  (1.651 m)  Wt 126 lb 12.8 oz (57.516 kg)  BMI 21.10 kg/m2  SpO2 97% , BMI Body mass index is 21.1 kg/(m^2). GEN: Well nourished, well developed, in no acute distress HEENT: normal Neck: no JVD, carotid bruits, or masses Cardiac: RRR; no murmurs, rubs, or gallops,no edema  Respiratory:  clear to auscultation bilaterally, normal work of breathing GI: soft, nontender, nondistended, + BS MS: no deformity or atrophy Skin: warm and dry, no rash Neuro:  Strength and sensation are intact Psych: euthymic mood, full affect   EKG:  EKG was ordered today and showed NSR with PAC's and nonspecific ST abnormality    Recent Labs: 12/14/2013: ALT 18; BUN 18; Creatinine 1.0; Potassium 4.1; Sodium 140    Lipid Panel No results found for: CHOL, TRIG, HDL, CHOLHDL, VLDL, LDLCALC, LDLDIRECT    Wt Readings from Last 3 Encounters:  06/13/14 126 lb 12.8 oz (57.516 kg)  01/04/14 124 lb 1.9 oz (56.3 kg)  12/14/13 123 lb 9.6 oz (56.065 kg)      ASSESSMENT AND PLAN:  1. Vertigo - resolved after stopping coreg  2. HTN - controlled - continue Lisinopril/amlodipine/HCTZ - check BMET 3. PAF s/p TEE/DCCV - maintaining NSR .  CHADS VASC score of 7. - continue amio/Eliquis - check TSH/BMET/CBC 4. Tachycardia induced DCM now resolved  5. Fatigue improved after stopping coreg and cutting back dose of amio  6. Syncope ? Etiology possible bradyarrhythmias vs. orthostasis. Seen by EP and felt to be due to orthostasis.  Patient refused ILR.  She has not had any further episodes 7. Chest pain with normal nuclear stress test - pain has resolved.  Current medicines are reviewed at length with the patient today.  The patient does not have concerns regarding medicines.  The following changes have been made:  no change  Labs/ tests ordered today include: TSH/CBC/BMET No orders of the  defined types were placed in this encounter.     Disposition:   FU with me in 6 months   Signed, Sueanne Margarita, MD  06/13/2014 11:13 AM    [facility] [place], [place], [place]  [address] Phone: [phone]; Fax: [phone]

## 2014-06-13 ENCOUNTER — Encounter: Payer: Self-pay | Admitting: Cardiology

## 2014-06-13 ENCOUNTER — Ambulatory Visit (INDEPENDENT_AMBULATORY_CARE_PROVIDER_SITE_OTHER): Payer: Medicare Other | Admitting: Cardiology

## 2014-06-13 VITALS — BP 140/56 | HR 62 | Ht 65.0 in | Wt 126.8 lb

## 2014-06-13 DIAGNOSIS — R000 Tachycardia, unspecified: Secondary | ICD-10-CM

## 2014-06-13 DIAGNOSIS — I10 Essential (primary) hypertension: Secondary | ICD-10-CM

## 2014-06-13 DIAGNOSIS — I480 Paroxysmal atrial fibrillation: Secondary | ICD-10-CM | POA: Diagnosis not present

## 2014-06-13 DIAGNOSIS — R55 Syncope and collapse: Secondary | ICD-10-CM

## 2014-06-13 DIAGNOSIS — I43 Cardiomyopathy in diseases classified elsewhere: Secondary | ICD-10-CM

## 2014-06-13 NOTE — Patient Instructions (Signed)
Your physician recommends that you continue on your current medications as directed. Please refer to the Current Medication list given to you today.  Your physician wants you to follow-up in: 6 months with Dr Turner You will receive a reminder letter in the mail two months in advance. If you don't receive a letter, please call our office to schedule the follow-up appointment.  

## 2014-07-18 ENCOUNTER — Other Ambulatory Visit: Payer: Self-pay | Admitting: Cardiology

## 2014-08-14 DIAGNOSIS — I480 Paroxysmal atrial fibrillation: Secondary | ICD-10-CM | POA: Diagnosis not present

## 2014-08-14 DIAGNOSIS — E119 Type 2 diabetes mellitus without complications: Secondary | ICD-10-CM | POA: Diagnosis not present

## 2014-08-14 DIAGNOSIS — I10 Essential (primary) hypertension: Secondary | ICD-10-CM | POA: Diagnosis not present

## 2014-08-14 DIAGNOSIS — I420 Dilated cardiomyopathy: Secondary | ICD-10-CM | POA: Diagnosis not present

## 2014-08-14 DIAGNOSIS — R609 Edema, unspecified: Secondary | ICD-10-CM | POA: Diagnosis not present

## 2014-08-17 ENCOUNTER — Telehealth: Payer: Self-pay | Admitting: Cardiology

## 2014-08-17 NOTE — Telephone Encounter (Signed)
Left message to call back  

## 2014-08-17 NOTE — Telephone Encounter (Signed)
Cynthia Wright with Dr. Zadie Rhine st that the patient was in for an [place] on 08/13/14 and said she is not taking her amiodarone. Dr. Zadie Rhine would like to know if she should still be taking it and to call the patient with instruction. Dr. Zadie Rhine would also like the patient to be seen by Dr. Radford Pax for LE edema.   Spoke with patient, who is extremely confused about her medications. She st her swelling is slightly better since Dr. Zadie Rhine started her on Lasix (she is not sure of the dose). Patient does not think she is taking amiodarone but is unsure of how long. Patient agrees to [name], 5/23 at 1015. She understands to bring her medications for verification to her visit.

## 2014-08-17 NOTE — Telephone Encounter (Signed)
New message       Pt say Dr Zadie Rhine yesterday.  She told them she was no longer taking amiodarone.  Dr Zadie Rhine want to confirm to confirm this and talk to someone about her leg edema

## 2014-08-21 ENCOUNTER — Encounter: Payer: Self-pay | Admitting: Cardiology

## 2014-08-21 ENCOUNTER — Ambulatory Visit (INDEPENDENT_AMBULATORY_CARE_PROVIDER_SITE_OTHER): Payer: Medicare Other | Admitting: Cardiology

## 2014-08-21 VITALS — BP 120/58 | HR 77 | Ht 65.0 in | Wt 129.8 lb

## 2014-08-21 DIAGNOSIS — R000 Tachycardia, unspecified: Secondary | ICD-10-CM

## 2014-08-21 DIAGNOSIS — I10 Essential (primary) hypertension: Secondary | ICD-10-CM | POA: Diagnosis not present

## 2014-08-21 DIAGNOSIS — I480 Paroxysmal atrial fibrillation: Secondary | ICD-10-CM | POA: Diagnosis not present

## 2014-08-21 DIAGNOSIS — I5032 Chronic diastolic (congestive) heart failure: Secondary | ICD-10-CM | POA: Diagnosis not present

## 2014-08-21 DIAGNOSIS — I43 Cardiomyopathy in diseases classified elsewhere: Secondary | ICD-10-CM

## 2014-08-21 DIAGNOSIS — R55 Syncope and collapse: Secondary | ICD-10-CM

## 2014-08-21 LAB — HEPATIC FUNCTION PANEL
ALBUMIN: 4.4 g/dL (ref 3.5–5.2)
ALT: 21 U/L (ref 0–35)
AST: 23 U/L (ref 0–37)
Alkaline Phosphatase: 67 U/L (ref 39–117)
BILIRUBIN TOTAL: 0.5 mg/dL (ref 0.2–1.2)
Bilirubin, Direct: 0.1 mg/dL (ref 0.0–0.3)
Total Protein: 7.3 g/dL (ref 6.0–8.3)

## 2014-08-21 LAB — CBC WITH DIFFERENTIAL/PLATELET
BASOS ABS: 0 10*3/uL (ref 0.0–0.1)
Basophils Relative: 0.5 % (ref 0.0–3.0)
Eosinophils Absolute: 0 10*3/uL (ref 0.0–0.7)
Eosinophils Relative: 0.3 % (ref 0.0–5.0)
HCT: 39 % (ref 36.0–46.0)
Hemoglobin: 13.2 g/dL (ref 12.0–15.0)
Lymphocytes Relative: 19.6 % (ref 12.0–46.0)
Lymphs Abs: 1.3 10*3/uL (ref 0.7–4.0)
MCHC: 33.7 g/dL (ref 30.0–36.0)
MCV: 98 fl (ref 78.0–100.0)
MONOS PCT: 6.8 % (ref 3.0–12.0)
Monocytes Absolute: 0.5 10*3/uL (ref 0.1–1.0)
Neutro Abs: 4.9 10*3/uL (ref 1.4–7.7)
Neutrophils Relative %: 72.8 % (ref 43.0–77.0)
Platelets: 184 10*3/uL (ref 150.0–400.0)
RBC: 3.98 Mil/uL (ref 3.87–5.11)
RDW: 13.4 % (ref 11.5–15.5)
WBC: 6.7 10*3/uL (ref 4.0–10.5)

## 2014-08-21 LAB — BASIC METABOLIC PANEL
BUN: 19 mg/dL (ref 6–23)
CHLORIDE: 106 meq/L (ref 96–112)
CO2: 26 meq/L (ref 19–32)
Calcium: 9.4 mg/dL (ref 8.4–10.5)
Creatinine, Ser: 0.98 mg/dL (ref 0.40–1.20)
GFR: 56.97 mL/min — ABNORMAL LOW (ref >60.00)
GLUCOSE: 96 mg/dL (ref 70–99)
Potassium: 3.9 mEq/L (ref 3.5–5.1)
Sodium: 140 mEq/L (ref 135–145)

## 2014-08-21 LAB — TSH: TSH: 4.43 u[IU]/mL (ref 0.35–4.50)

## 2014-08-21 NOTE — Patient Instructions (Addendum)
Medication Instructions:  Your physician recommends that you continue on your current medications as directed. Please refer to the Current Medication list given to you today.   Labwork: TODAY: BMET, CBC, TSH, LFTs  Testing/Procedures: None  Follow-Up: Your physician wants you to follow-up in: 6 months with Dr. Radford Pax. You will receive a reminder letter in the mail two months in advance. If you don't receive a letter, please call our office to schedule the follow-up appointment.   Any Other Special Instructions Will Be Listed Below (If Applicable).

## 2014-08-21 NOTE — Progress Notes (Signed)
Cardiology Office Note   Date:  08/21/2014   ID:  Cynthia, Wright Jun 02, 1924, MRN [id number]  PCP:  Milagros Evener, MD  Cardiologist:   Sueanne Margarita, MD   Chief Complaint  Patient presents with  . Follow-up    dilated cardiomyopathy secondary to tachycardia      History of Present Illness: DANISE DEHNE is a 81 y.o. 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 She has had several episodes of syncope over the past year and neuro workup was negative. She was referred to EP for loop recorder but patient cancelled first appt with Dr. Lovena Le and then never showed up for second appt due to a family illness. She is doing much better and has not had any syncope. She has not really had any dizziness either but occasionally has some balance issues. She denies any chest pain, SOB, DOE or palpitations. She rarely will have some mild LE edema.     Past Medical History  Diagnosis Date  . Hypertension   . Diabetes mellitus without complication   . Hyperlipidemia   . Atrial fibrillation     s/p DCCV 07/2012  . Chronic anticoagulation   . Dilated cardiomyopathy secondary to tachycardia 07/2012    2D echo 10/2012 showed EF 65% with grade II diastolic dysfunction  . Liver cyst   . Elevated cholesterol   . Benign diastolic hypertension   . Encounter for long-term (current) use of other medications   . Chronic diastolic CHF (congestive heart failure)     EF now 65% by echo 10/2012 with grade II diasotlid dysfunction  . Chronic systolic congestive heart failure     resolved    Past Surgical History  Procedure Laterality Date  . Back surgery      herniated disc  . Tee without cardioversion N/A 08/09/2012    Procedure: TRANSESOPHAGEAL ECHOCARDIOGRAM (TEE);  Surgeon: Sueanne Margarita, MD;  Location: [facility] ENDOSCOPY;  Service: Cardiovascular;  Laterality: N/A;  talk to angie in  [place].  . Cardioversion N/A 08/09/2012    Procedure: CARDIOVERSION;  Surgeon: Sueanne Margarita, MD;  Location: [facility] ENDOSCOPY;  Service: Cardiovascular;  Laterality: N/A;     Current Outpatient Prescriptions  Medication Sig Dispense Refill  . amiodarone (PACERONE) 200 MG tablet 1/2 tablet by mouth daily. 30 tablet 6  . amLODipine (NORVASC) 10 MG tablet TAKE 1 TABLET (10 MG TOTAL) BY MOUTH DAILY. 30 tablet 11  . apixaban (ELIQUIS) 2.5 MG TABS tablet Take 1 tablet (2.5 mg total) by mouth 2 (two) times daily. 60 tablet 11  . furosemide (LASIX) 20 MG tablet Take 20 mg by mouth daily.    [name] Kitchen glucose blood test strip TEST BLOOD GLUCOSE TWICE A DAY AND RECORD    . hydrochlorothiazide (HYDRODIURIL) 25 MG tablet TAKE 1 TABLET (25 MG TOTAL) BY MOUTH DAILY. 30 tablet 6  . lisinopril (PRINIVIL,ZESTRIL) 40 MG tablet TAKE 1 TABLET (40 MG TOTAL) BY MOUTH DAILY. 30 tablet 6  . Multiple Vitamin (MULTIVITAMIN WITH MINERALS) TABS Take 1 tablet by mouth daily.    [name] DELICA LANCETS 97Q MISC CHECK BLOOD GLUCOSE TWICE A DAY AND RECORD    . OVER THE COUNTER MEDICATION Take 2 stool softener capsules before bedtime    . OVER THE COUNTER MEDICATION Place 3 drops into both eyes daily as needed (burning sensation in eyes). Ensure rewetting drops    . potassium  chloride SA (K-DUR,KLOR-CON) 20 MEQ tablet Take two tablets every other day 30 tablet 6  . rosuvastatin (CRESTOR) 5 MG tablet Take 5 mg by mouth daily.    . sitaGLIPtin (JANUVIA) 100 MG tablet Take 100 mg by mouth daily.    [name] Kitchen zolpidem (AMBIEN) 5 MG tablet Take 2.5-5 mg by mouth at bedtime as needed for sleep.      No current facility-administered medications for this visit.    Allergies:   Codeine and Lortab    Social History:  The patient  reports that she has quit smoking. She has never used smokeless tobacco. She reports that she drinks alcohol. She reports that she does not use illicit drugs.   Family History:  The patient's family history includes CAD  in her father and mother; Heart disease in her father and mother; Pancreatic cancer in her brother.    ROS:  Please see the history of present illness.   Otherwise, review of systems are positive for anxiety and hearing loss.   All other systems are reviewed and negative.    PHYSICAL EXAM: VS:  BP 120/58 mmHg  Pulse 77  Ht 5\' 5"  (1.651 m)  Wt 129 lb 12.8 oz (58.877 kg)  BMI 21.60 kg/m2  SpO2 99% , BMI Body mass index is 21.6 kg/(m^2). GEN: Well nourished, well developed, in no acute distress HEENT: normal Neck: no JVD, carotid bruits, or masses Cardiac: RRR; no murmurs, rubs, or gallops,no edema occasional ectopy Respiratory:  clear to auscultation bilaterally, normal work of breathing GI: soft, nontender, nondistended, + BS MS: no deformity or atrophy Skin: warm and dry, no rash Neuro:  Strength and sensation are intact Psych: euthymic mood, full affect   EKG:  EKG was ordered today and showed NSR with PAC's and junctional beats    Recent Labs: 12/14/2013: ALT 18; BUN 18; Creatinine 1.0; Potassium 4.1; Sodium 140    Lipid Panel No results found for: CHOL, TRIG, HDL, CHOLHDL, VLDL, LDLCALC, LDLDIRECT    Wt Readings from Last 3 Encounters:  08/21/14 129 lb 12.8 oz (58.877 kg)  06/13/14 126 lb 12.8 oz (57.516 kg)  01/04/14 124 lb 1.9 oz (56.3 kg)     ASSESSMENT AND PLAN:  1. Vertigo - resolved after stopping coreg  2. HTN - controlled - continue Lisinopril/amlodipine/HCTZ - check BMET 3. PAF s/p TEE/DCCV - maintaining NSR . CHADS VASC score of 7. - continue amio/Eliquis - check TSH/CMET/CBC 4. Tachycardia induced DCM now resolved  5. Fatigue improved after stopping coreg and cutting back dose of amio  6. Syncope ? Etiology possible bradyarrhythmias vs. orthostasis. Seen by EP and felt to be due to orthostasis. Patient refused ILR. She has not had any further episodes 7. Chest pain with normal nuclear stress test - pain has resolved   Current medicines are  reviewed at length with the patient today.  The patient does not have concerns regarding medicines.  The following changes have been made:  no change  Labs/ tests ordered today include: none  No orders of the defined types were placed in this encounter.     Disposition:   FU with me in 6 months  Signed, Sueanne Margarita, MD  08/21/2014 10:38 AM    [facility] [place], [place], [place]  [address] Phone: [phone]; Fax: [phone]

## 2014-08-29 ENCOUNTER — Telehealth: Payer: Self-pay | Admitting: Cardiology

## 2014-08-29 NOTE — Telephone Encounter (Signed)
Patient st she got a parking ticket last week. She requests Dr. Theodosia Blender help with getting a handicap placard for her car because of some dizziness she has had.  Informed patient I will let her know what Dr. Radford Pax says tomorrow.

## 2014-08-29 NOTE — Telephone Encounter (Signed)
New Message  Pt wanting to speak w/ Rn about getting a handicap sticker. Please call back and discuss.

## 2014-08-30 ENCOUNTER — Encounter: Payer: Self-pay | Admitting: Cardiology

## 2014-08-30 NOTE — Telephone Encounter (Signed)
Follow up  Pt calling to follow up on previous note from 5/31. Please call back and discuss.

## 2014-08-30 NOTE — Telephone Encounter (Signed)
Per Dr. Radford Pax, patient is to contact PCP for handicap placard.   Left message to call back.

## 2014-08-30 NOTE — Telephone Encounter (Signed)
This encounter was created in error - please disregard.

## 2014-08-30 NOTE — Telephone Encounter (Signed)
Informed patient to contact her PCP for her handicap placard. Patient grateful for callback.

## 2014-08-31 ENCOUNTER — Telehealth: Payer: Self-pay | Admitting: Internal Medicine

## 2014-08-31 NOTE — Telephone Encounter (Signed)
Walk in pt form-Handicapped paper-dropped off gave to [employer]

## 2014-09-14 DIAGNOSIS — R609 Edema, unspecified: Secondary | ICD-10-CM | POA: Diagnosis not present

## 2014-09-14 DIAGNOSIS — I480 Paroxysmal atrial fibrillation: Secondary | ICD-10-CM | POA: Diagnosis not present

## 2014-09-15 ENCOUNTER — Inpatient Hospital Stay (HOSPITAL_COMMUNITY): Admission: RE | Admit: 2014-09-15 | Payer: Medicare Other | Source: Ambulatory Visit | Admitting: Nurse Practitioner

## 2014-09-15 ENCOUNTER — Telehealth: Payer: Self-pay

## 2014-09-15 NOTE — Telephone Encounter (Signed)
Received referral from PCP to see cardiology as patient was in Afib in the 130's at [facility] 6/16. Per Dr. Radford Pax, patient to be checked in [facility].  Spoke with Marzetta Board, RN and confirmed appointment with patient TODAY, 6/17 at 1130. Patient aware of appointment and given phone number and directions.

## 2014-09-18 ENCOUNTER — Ambulatory Visit (HOSPITAL_COMMUNITY)
Admission: RE | Admit: 2014-09-18 | Discharge: 2014-09-18 | Disposition: A | Discharge status: Home / Self Care | Payer: Medicare Other | Source: Ambulatory Visit | Attending: Nurse Practitioner | Admitting: Nurse Practitioner

## 2014-09-18 ENCOUNTER — Encounter (HOSPITAL_COMMUNITY): Payer: Self-pay | Admitting: Nurse Practitioner

## 2014-09-18 VITALS — BP 140/78 | HR 121 | Ht 65.0 in | Wt 137.8 lb

## 2014-09-18 DIAGNOSIS — I420 Dilated cardiomyopathy: Secondary | ICD-10-CM | POA: Diagnosis not present

## 2014-09-18 DIAGNOSIS — I5032 Chronic diastolic (congestive) heart failure: Secondary | ICD-10-CM | POA: Insufficient documentation

## 2014-09-18 DIAGNOSIS — I4891 Unspecified atrial fibrillation: Secondary | ICD-10-CM | POA: Diagnosis not present

## 2014-09-18 DIAGNOSIS — E119 Type 2 diabetes mellitus without complications: Secondary | ICD-10-CM | POA: Diagnosis not present

## 2014-09-18 DIAGNOSIS — R635 Abnormal weight gain: Secondary | ICD-10-CM | POA: Insufficient documentation

## 2014-09-18 DIAGNOSIS — Z87891 Personal history of nicotine dependence: Secondary | ICD-10-CM | POA: Insufficient documentation

## 2014-09-18 DIAGNOSIS — E780 Pure hypercholesterolemia: Secondary | ICD-10-CM | POA: Diagnosis not present

## 2014-09-18 DIAGNOSIS — Z79899 Other long term (current) drug therapy: Secondary | ICD-10-CM | POA: Insufficient documentation

## 2014-09-18 DIAGNOSIS — Z885 Allergy status to narcotic agent status: Secondary | ICD-10-CM | POA: Insufficient documentation

## 2014-09-18 DIAGNOSIS — I4819 Other persistent atrial fibrillation: Secondary | ICD-10-CM

## 2014-09-18 DIAGNOSIS — Z7902 Long term (current) use of antithrombotics/antiplatelets: Secondary | ICD-10-CM | POA: Insufficient documentation

## 2014-09-18 DIAGNOSIS — I480 Paroxysmal atrial fibrillation: Secondary | ICD-10-CM | POA: Diagnosis present

## 2014-09-18 DIAGNOSIS — I10 Essential (primary) hypertension: Secondary | ICD-10-CM | POA: Insufficient documentation

## 2014-09-18 DIAGNOSIS — I481 Persistent atrial fibrillation: Secondary | ICD-10-CM | POA: Diagnosis not present

## 2014-09-18 DIAGNOSIS — E785 Hyperlipidemia, unspecified: Secondary | ICD-10-CM | POA: Diagnosis not present

## 2014-09-18 DIAGNOSIS — Z8249 Family history of ischemic heart disease and other diseases of the circulatory system: Secondary | ICD-10-CM | POA: Insufficient documentation

## 2014-09-18 LAB — HEPATIC FUNCTION PANEL
ALT: 31 U/L (ref 14–54)
AST: 25 U/L (ref 15–41)
Albumin: 3.6 g/dL (ref 3.5–5.0)
Alkaline Phosphatase: 75 U/L (ref 38–126)
TOTAL PROTEIN: 6.7 g/dL (ref 6.5–8.1)
Total Bilirubin: 0.6 mg/dL (ref 0.3–1.2)

## 2014-09-18 LAB — CBC
HCT: 41.5 % (ref 36.0–46.0)
Hemoglobin: 13.7 g/dL (ref 12.0–15.0)
MCH: 33 pg (ref 26.0–34.0)
MCHC: 33 g/dL (ref 30.0–36.0)
MCV: 100 fL (ref 78.0–100.0)
PLATELETS: 256 10*3/uL (ref 150–400)
RBC: 4.15 MIL/uL (ref 3.87–5.11)
RDW: 13.8 % (ref 11.5–15.5)
WBC: 6.3 10*3/uL (ref 4.0–10.5)

## 2014-09-18 LAB — TSH: TSH: 5.664 u[IU]/mL — ABNORMAL HIGH (ref 0.350–4.500)

## 2014-09-18 MED ORDER — AMIODARONE HCL 200 MG PO TABS: 200.0000 mg | ORAL_TABLET | Freq: Two times a day (BID) | ORAL | Status: DC

## 2014-09-18 MED ORDER — METOPROLOL SUCCINATE ER 25 MG PO TB24: 25.0000 mg | ORAL_TABLET | Freq: Every day | ORAL | Status: DC

## 2014-09-18 NOTE — Progress Notes (Signed)
Patient ID: Cynthia Wright, female   DOB: [birth year], 81 y.o.   MRN: [id number]     Primary Care Physician: Milagros Evener, MD Referring Physician:Dr. Ashok Norris   Cynthia Wright is a 81 y.o. female with a h/o PAF that went to her PCP last week and was found to be in afib with RVR. Pt was unaware of afib. The only thing out of her usual health is that she went on a trip the week before and forgot her lasix. She felt like she had gained a few pounds of water weight after the trip. Dr. Radford Pax called in Cardizem 180 mg q day, which she started  last Thursday, for additional rate control and she has experienced lower extremity edema and has had an additional 4 lbs of fluid gain.  She is on 100 mg of amiodarone as a maintenance dose. Has noticed mild dizziness.Takes apixaban on a regular basis.  Today, she denies symptoms of palpitations, chest pain, shortness of breath, orthopnea, PND,  dizziness, presyncope, syncope, or neurologic sequela. The patient is tolerating medications without difficulties and is otherwise without complaint today.   Past Medical History  Diagnosis Date  . Hypertension   . Diabetes mellitus without complication   . Hyperlipidemia   . Atrial fibrillation     s/p DCCV 07/2012  . Chronic anticoagulation   . Dilated cardiomyopathy secondary to tachycardia 07/2012    2D echo 10/2012 showed EF 65% with grade II diastolic dysfunction  . Liver cyst   . Elevated cholesterol   . Benign diastolic hypertension   . Encounter for long-term (current) use of other medications   . Chronic diastolic CHF (congestive heart failure)     EF now 65% by echo 10/2012 with grade II diasotlid dysfunction  . Chronic systolic congestive heart failure     resolved   Past Surgical History  Procedure Laterality Date  . Back surgery      herniated disc  . Tee without cardioversion N/A 08/09/2012    Procedure: TRANSESOPHAGEAL ECHOCARDIOGRAM (TEE);  Surgeon: Sueanne Margarita, MD;  Location: [facility]  ENDOSCOPY;  Service: Cardiovascular;  Laterality: N/A;  talk to angie in [place].  . Cardioversion N/A 08/09/2012    Procedure: CARDIOVERSION;  Surgeon: Sueanne Margarita, MD;  Location: [facility] ENDOSCOPY;  Service: Cardiovascular;  Laterality: N/A;    Current Outpatient Prescriptions  Medication Sig Dispense Refill  . amiodarone (PACERONE) 200 MG tablet Take 1 tablet (200 mg total) by mouth 2 (two) times daily. 30 tablet 6  . apixaban (ELIQUIS) 2.5 MG TABS tablet Take 1 tablet (2.5 mg total) by mouth 2 (two) times daily. 60 tablet 11  . furosemide (LASIX) 20 MG tablet Take 20 mg by mouth daily.    [name] Kitchen glucose blood test strip TEST BLOOD GLUCOSE TWICE A DAY AND RECORD    . lisinopril (PRINIVIL,ZESTRIL) 40 MG tablet TAKE 1 TABLET (40 MG TOTAL) BY MOUTH DAILY. 30 tablet 6  . Multiple Vitamin (MULTIVITAMIN WITH MINERALS) TABS Take 1 tablet by mouth daily.    [name] DELICA LANCETS 29H MISC CHECK BLOOD GLUCOSE TWICE A DAY AND RECORD    . OVER THE COUNTER MEDICATION Take 2 stool softener capsules before bedtime    . OVER THE COUNTER MEDICATION Place 3 drops into both eyes daily as needed (burning sensation in eyes). Ensure rewetting drops    . potassium chloride SA (K-DUR,KLOR-CON) 20 MEQ tablet Take two tablets every other day 30 tablet 6  . rosuvastatin (CRESTOR) 5 MG  tablet Take 5 mg by mouth daily.    . sitaGLIPtin (JANUVIA) 100 MG tablet Take 100 mg by mouth daily.    . metoprolol succinate (TOPROL XL) 25 MG 24 hr tablet Take 1 tablet (25 mg total) by mouth daily. 30 tablet 1  . zolpidem (AMBIEN) 5 MG tablet Take 2.5-5 mg by mouth at bedtime as needed for sleep.      No current facility-administered medications for this encounter.    Allergies  Allergen Reactions  . Codeine Other (See Comments)    "Just get sick"  . Lortab [Hydrocodone-Acetaminophen] Other (See Comments)    "Just get sick"    History   Social History  . Marital Status: Single    Spouse Name: N/A  . Number of Children: N/A    . Years of Education: N/A   Occupational History  . Not on file.   Social History Main Topics  . Smoking status: Former Research scientist (life sciences)  . Smokeless tobacco: Never Used  . Alcohol Use: 0.0 oz/week    1-2 Glasses of wine per week  . Drug Use: No  . Sexual Activity: Not on file   Other Topics Concern  . Not on file   Social History Narrative   Divorced, 3 children   Right handed   Some college   2 cups daily     Family History  Problem Relation Age of Onset  . Heart disease Mother   . Heart disease Father   . CAD Mother   . CAD Father   . Pancreatic cancer Brother     ROS- All systems are reviewed and negative except as per the HPI above  Physical Exam: Filed Vitals:   09/18/14 0837  BP: 140/78  Pulse: 121  Height: 5\' 5"  (1.651 m)  Weight: 137 lb 12.8 oz (62.506 kg)    GEN- The patient is well appearing, alert and oriented x 3 today.   Head- normocephalic, atraumatic Eyes-  Sclera clear, conjunctiva pink Ears- hearing intact Oropharynx- clear Neck- supple, no JVP Lymph- no cervical lymphadenopathy Lungs- Clear to ausculation bilaterally, normal work of breathing Heart-Rapid rate and  rhythm, no murmurs, rubs or gallops, PMI not laterally displaced GI- soft, NT, ND, + BS Extremities- no clubbing, cyanosis, or 2t pitting edema. MS- no significant deformity or atrophy Skin- no rash or lesion Psych- euthymic mood, full affect Neuro- strength and sensation are intact  EKG- Afib with RVR at 121 bpm, QRS 88 ms, QTc 479 ms   Assessment and Plan: 1. Afib with RVR Since Cardizem appears to have caused additoinal LEE and weight gain, will stop drug and add metoprolol 25 ER once a day for rate control.  She will increase amiodarone to 200 mg bid for 1-2 weeks and if drug does not convert, plan DCCV. She is compliant with apixaban and is not to miss any doses.  2. LLE and weight gain Increase lasix to 40 mg x 3 days Weight daily Avoid salt/elevate legs when  sitting Stop cardizem due to LLE being worse on drug   3. HTN Stable  She will call on Wednesday pm with weight, BP,HR because if stable she would like to go to [facility] for a family reunion Thur-Sun. F/u in the office in one week when DCCV will be scheduled if not converted with increased dose of  amiodarone. Labs CBC, TSH, LIver today.  TSH returned elevated at 5.66, just 4 weeks ago was in normal range. Will draw full thyroid panel on  return. CBC/LIver otherwise unremarkable.

## 2014-09-18 NOTE — Patient Instructions (Addendum)
Your physician has recommended you make the following change in your medication:  1)Stop Diltiazem 2)Increase Amiodarone to 200mg  twice a day 3)Increase lasix to 40mg  for the next 3 days (if weight/swelling continues to be up can take 40mg  if needed while on trip) 4)Start Metoprolol 25mg  once a day  Weigh yourself daily  Call Butch Penny on Wednesday afternoon to report how you are feeling prior to going out of town. [phone]  Parking code [id number]

## 2014-09-20 ENCOUNTER — Telehealth (HOSPITAL_COMMUNITY): Payer: Self-pay | Admitting: *Deleted

## 2014-09-20 NOTE — Telephone Encounter (Signed)
Patient called in to report her weight was down and swelling was much better -- not completely gone but a lot better than when she was in for office visit on Monday. She states she feels better since medication changes but decided to cancel her beach trip because she was nervous to have issues and be away from home.  She could not remember her blood pressure/heart rate from this morning so will take that and call me back with reading.

## 2014-09-21 NOTE — Addendum Note (Signed)
Addended by: Derl Barrow on: 09/21/2014 10:49 AM   Modules accepted: Miquel Dunn

## 2014-09-24 ENCOUNTER — Other Ambulatory Visit: Payer: Self-pay | Admitting: Cardiology

## 2014-09-25 ENCOUNTER — Other Ambulatory Visit: Payer: Self-pay

## 2014-09-25 MED ORDER — POTASSIUM CHLORIDE CRYS ER 20 MEQ PO TBCR: EXTENDED_RELEASE_TABLET | ORAL | Status: DC

## 2014-09-26 ENCOUNTER — Ambulatory Visit (HOSPITAL_COMMUNITY)
Admission: RE | Admit: 2014-09-26 | Discharge: 2014-09-26 | Disposition: A | Discharge status: Home / Self Care | Payer: Medicare Other | Source: Ambulatory Visit | Attending: Nurse Practitioner | Admitting: Nurse Practitioner

## 2014-09-26 ENCOUNTER — Encounter (HOSPITAL_COMMUNITY): Payer: Self-pay | Admitting: Nurse Practitioner

## 2014-09-26 VITALS — BP 126/78 | HR 115 | Wt 130.0 lb

## 2014-09-26 DIAGNOSIS — Z79899 Other long term (current) drug therapy: Secondary | ICD-10-CM | POA: Insufficient documentation

## 2014-09-26 DIAGNOSIS — Z7902 Long term (current) use of antithrombotics/antiplatelets: Secondary | ICD-10-CM | POA: Diagnosis not present

## 2014-09-26 DIAGNOSIS — I4819 Other persistent atrial fibrillation: Secondary | ICD-10-CM

## 2014-09-26 DIAGNOSIS — I10 Essential (primary) hypertension: Secondary | ICD-10-CM | POA: Diagnosis not present

## 2014-09-26 DIAGNOSIS — E119 Type 2 diabetes mellitus without complications: Secondary | ICD-10-CM | POA: Diagnosis not present

## 2014-09-26 DIAGNOSIS — E785 Hyperlipidemia, unspecified: Secondary | ICD-10-CM | POA: Insufficient documentation

## 2014-09-26 DIAGNOSIS — I5022 Chronic systolic (congestive) heart failure: Secondary | ICD-10-CM | POA: Insufficient documentation

## 2014-09-26 DIAGNOSIS — I481 Persistent atrial fibrillation: Secondary | ICD-10-CM

## 2014-09-26 DIAGNOSIS — Z8249 Family history of ischemic heart disease and other diseases of the circulatory system: Secondary | ICD-10-CM | POA: Diagnosis not present

## 2014-09-26 DIAGNOSIS — I5032 Chronic diastolic (congestive) heart failure: Secondary | ICD-10-CM | POA: Diagnosis not present

## 2014-09-26 DIAGNOSIS — Z87891 Personal history of nicotine dependence: Secondary | ICD-10-CM | POA: Insufficient documentation

## 2014-09-26 DIAGNOSIS — E780 Pure hypercholesterolemia: Secondary | ICD-10-CM | POA: Insufficient documentation

## 2014-09-26 DIAGNOSIS — I4891 Unspecified atrial fibrillation: Secondary | ICD-10-CM | POA: Insufficient documentation

## 2014-09-26 DIAGNOSIS — Z885 Allergy status to narcotic agent status: Secondary | ICD-10-CM | POA: Insufficient documentation

## 2014-09-26 LAB — BASIC METABOLIC PANEL
ANION GAP: 8 (ref 5–15)
BUN: 20 mg/dL (ref 6–20)
CHLORIDE: 111 mmol/L (ref 101–111)
CO2: 24 mmol/L (ref 22–32)
Calcium: 9 mg/dL (ref 8.9–10.3)
Creatinine, Ser: 1.23 mg/dL — ABNORMAL HIGH (ref 0.44–1.00)
GFR calc non Af Amer: 38 mL/min — ABNORMAL LOW (ref >60)
GFR, EST AFRICAN AMERICAN: 44 mL/min — AB (ref >60)
Glucose, Bld: 132 mg/dL — ABNORMAL HIGH (ref 65–99)
POTASSIUM: 3.7 mmol/L (ref 3.5–5.1)
Sodium: 143 mmol/L (ref 135–145)

## 2014-09-26 LAB — CBC
HEMATOCRIT: 41.2 % (ref 36.0–46.0)
Hemoglobin: 13.5 g/dL (ref 12.0–15.0)
MCH: 33.1 pg (ref 26.0–34.0)
MCHC: 32.8 g/dL (ref 30.0–36.0)
MCV: 101 fL — ABNORMAL HIGH (ref 78.0–100.0)
Platelets: 217 10*3/uL (ref 150–400)
RBC: 4.08 MIL/uL (ref 3.87–5.11)
RDW: 13.8 % (ref 11.5–15.5)
WBC: 6.7 10*3/uL (ref 4.0–10.5)

## 2014-09-26 LAB — T4, FREE: Free T4: 1.03 ng/dL (ref 0.61–1.12)

## 2014-09-26 LAB — TSH: TSH: 4.192 u[IU]/mL (ref 0.350–4.500)

## 2014-09-26 NOTE — Patient Instructions (Signed)
Increase metoprolol to 11/2 tab a day  for  Fast heart rate. We will be in contact when your cardioversion will be arranged.

## 2014-09-26 NOTE — Progress Notes (Signed)
Patient ID: Cynthia Wright, female   DOB: 12-28-1924, 81 y.o.   MRN: [id number]      Primary Care Physician: Milagros Evener, MD Referring Physician:Dr. Ashok Norris   Cynthia Wright is a 81 y.o. female with a h/o PAF that went to her PCP two weeks ago and was found to be in afib with RVR. Pt was unaware of afib. The only thing out of her usual health is that she went on a trip the week before and forgot her lasix. She felt like she had gained a few pounds of water weight after the trip. Dr. Radford Pax called in Cardizem 180 mg q day, which she started  last Thursday, for additional rate control and she has experienced lower extremity edema and has had an additional 4 lbs of fluid gain.  She is on 100 mg of amiodarone as a maintenance dose. Has noticed mild dizziness.Takes apixaban on a regular basis.  She returns today after amiodarone increased to 200 mg bid, Lasix increased for fluid weight and Cardizem stopped and metoprolol started.She has lost 7 lbs of fluid and over all feels better. However, instead of decreasing lasix back to 20 mg daily after increased dose of 40 mg qd x 3 days, she has continued on the higher dose. She however remains in afib with RVR, tolerating it well, feeling mostly fatigue, and cardioversion was discussed today since higher dose amiodarone has not converted to SR. She has not missed any does of apixaban.  Today, she denies symptoms of palpitations, chest pain, shortness of breath, orthopnea, PND,  dizziness, presyncope, syncope, or neurologic sequela. Biggest concern is that of fatigue over the last few weeks. The patient is tolerating medications without difficulties and is otherwise without complaint today.   Past Medical History  Diagnosis Date  . Hypertension   . Diabetes mellitus without complication   . Hyperlipidemia   . Atrial fibrillation     s/p DCCV 07/2012  . Chronic anticoagulation   . Dilated cardiomyopathy secondary to tachycardia 07/2012    2D  echo 10/2012 showed EF 65% with grade II diastolic dysfunction  . Liver cyst   . Elevated cholesterol   . Benign diastolic hypertension   . Encounter for long-term (current) use of other medications   . Chronic diastolic CHF (congestive heart failure)     EF now 65% by echo 10/2012 with grade II diasotlid dysfunction  . Chronic systolic congestive heart failure     resolved   Past Surgical History  Procedure Laterality Date  . Back surgery      herniated disc  . Tee without cardioversion N/A 08/09/2012    Procedure: TRANSESOPHAGEAL ECHOCARDIOGRAM (TEE);  Surgeon: Sueanne Margarita, MD;  Location: [facility] ENDOSCOPY;  Service: Cardiovascular;  Laterality: N/A;  talk to angie in [place].  . Cardioversion N/A 08/09/2012    Procedure: CARDIOVERSION;  Surgeon: Sueanne Margarita, MD;  Location: [facility] ENDOSCOPY;  Service: Cardiovascular;  Laterality: N/A;    Current Outpatient Prescriptions  Medication Sig Dispense Refill  . amiodarone (PACERONE) 200 MG tablet Take 1 tablet (200 mg total) by mouth 2 (two) times daily. 30 tablet 6  . apixaban (ELIQUIS) 2.5 MG TABS tablet Take 1 tablet (2.5 mg total) by mouth 2 (two) times daily. 60 tablet 11  . furosemide (LASIX) 20 MG tablet Take 20 mg by mouth daily.    Cynthia Wright glucose blood test strip TEST BLOOD GLUCOSE TWICE A DAY AND RECORD    . lisinopril (PRINIVIL,ZESTRIL) 40 MG tablet  TAKE 1 TABLET (40 MG TOTAL) BY MOUTH DAILY. 30 tablet 6  . metoprolol succinate (TOPROL XL) 25 MG 24 hr tablet Take 1 tablet (25 mg total) by mouth daily. 30 tablet 1  . Multiple Vitamin (MULTIVITAMIN WITH MINERALS) TABS Take 1 tablet by mouth daily.    Cynthia Wright DELICA LANCETS 46K MISC CHECK BLOOD GLUCOSE TWICE A DAY AND RECORD    . OVER THE COUNTER MEDICATION Take 2 stool softener capsules before bedtime    . OVER THE COUNTER MEDICATION Place 3 drops into both eyes daily as needed (burning sensation in eyes). Ensure rewetting drops    . potassium chloride SA (K-DUR,KLOR-CON) 20 MEQ tablet Take  two tablets every other day 30 tablet 6  . rosuvastatin (CRESTOR) 5 MG tablet Take 5 mg by mouth daily.    . sitaGLIPtin (JANUVIA) 100 MG tablet Take 100 mg by mouth daily.    Cynthia Wright zolpidem (AMBIEN) 5 MG tablet Take 2.5-5 mg by mouth at bedtime as needed for sleep.      No current facility-administered medications for this encounter.    Allergies  Allergen Reactions  . Codeine Other (See Comments)    "Just get sick"  . Lortab [Hydrocodone-Acetaminophen] Other (See Comments)    "Just get sick"    History   Social History  . Marital Status: Single    Spouse Name: N/A  . Number of Children: N/A  . Years of Education: N/A   Occupational History  . Not on file.   Social History Main Topics  . Smoking status: Former Research scientist (life sciences)  . Smokeless tobacco: Never Used  . Alcohol Use: 0.0 oz/week    1-2 Glasses of wine per week  . Drug Use: No  . Sexual Activity: Not on file   Other Topics Concern  . Not on file   Social History Narrative   Divorced, 3 children   Right handed   Some college   2 cups daily     Family History  Problem Relation Age of Onset  . Heart disease Mother   . Heart disease Father   . CAD Mother   . CAD Father   . Pancreatic cancer Brother     ROS- All systems are reviewed and negative except as per the HPI above  Physical Exam: Filed Vitals:   09/26/14 1105  BP: 126/78  Pulse: 115  Weight: 130 lb (58.968 kg)  SpO2: 98%    GEN- The patient is well appearing, alert and oriented x 3 today.   Head- normocephalic, atraumatic Eyes-  Sclera clear, conjunctiva pink Ears- hearing intact Oropharynx- clear Neck- supple, no JVP Lymph- no cervical lymphadenopathy Lungs- Clear to ausculation bilaterally, normal work of breathing Heart-Rapid rate and  rhythm, no murmurs, rubs or gallops, PMI not laterally displaced GI- soft, NT, ND, + BS Extremities- no clubbing, cyanosis, or 2t pitting edema. MS- no significant deformity or atrophy Skin- no rash or  lesion Psych- euthymic mood, full affect Neuro- strength and sensation are intact  EKG- Afib with RVR at 124 bpm, QRS 88 ms, QTc 502 ms   Assessment and Plan:  1. Afib with RVR Increase metoprolol ER  to 25 mg 11/2 tab once a day for better rate control.  Continue with Amiodarone 200 mg bid Will schedule for DCCV since the higher dose of amiodarone has not restored SR She is compliant with apixaban and reminded not to miss any doses. Pre procedure labs as well thyroid panel since TSH was elevated on  last labs.  2.Chronic diastolic heart failure/ LLE and weight gain Resolved Go back to lasix 20 mg a day  3. HTN Stable   F/u with Dr. Radford Pax post cardioversion

## 2014-09-27 ENCOUNTER — Other Ambulatory Visit (HOSPITAL_COMMUNITY): Payer: Self-pay | Admitting: *Deleted

## 2014-09-27 LAB — T3, FREE: T3 FREE: 2.7 pg/mL (ref 2.0–4.4)

## 2014-09-27 MED ORDER — METOPROLOL SUCCINATE ER 25 MG PO TB24: 37.5000 mg | ORAL_TABLET | Freq: Every day | ORAL | Status: DC

## 2014-09-28 ENCOUNTER — Ambulatory Visit (HOSPITAL_COMMUNITY): Payer: Medicare Other | Admitting: Anesthesiology

## 2014-09-28 ENCOUNTER — Other Ambulatory Visit: Payer: Self-pay

## 2014-09-28 ENCOUNTER — Ambulatory Visit (HOSPITAL_COMMUNITY)
Admission: RE | Admit: 2014-09-28 | Discharge: 2014-09-28 | Disposition: A | Discharge status: Home / Self Care | Payer: Medicare Other | Source: Ambulatory Visit | Attending: Cardiology | Admitting: Cardiology

## 2014-09-28 ENCOUNTER — Encounter (HOSPITAL_COMMUNITY)
Admission: RE | Disposition: A | Discharge status: Home / Self Care | Payer: Self-pay | Source: Ambulatory Visit | Attending: Cardiology

## 2014-09-28 ENCOUNTER — Encounter (HOSPITAL_COMMUNITY): Payer: Self-pay | Admitting: Anesthesiology

## 2014-09-28 DIAGNOSIS — E785 Hyperlipidemia, unspecified: Secondary | ICD-10-CM | POA: Diagnosis not present

## 2014-09-28 DIAGNOSIS — Z79899 Other long term (current) drug therapy: Secondary | ICD-10-CM | POA: Insufficient documentation

## 2014-09-28 DIAGNOSIS — Z87891 Personal history of nicotine dependence: Secondary | ICD-10-CM | POA: Insufficient documentation

## 2014-09-28 DIAGNOSIS — E119 Type 2 diabetes mellitus without complications: Secondary | ICD-10-CM | POA: Insufficient documentation

## 2014-09-28 DIAGNOSIS — I5042 Chronic combined systolic (congestive) and diastolic (congestive) heart failure: Secondary | ICD-10-CM | POA: Diagnosis not present

## 2014-09-28 DIAGNOSIS — I5032 Chronic diastolic (congestive) heart failure: Secondary | ICD-10-CM | POA: Diagnosis not present

## 2014-09-28 DIAGNOSIS — I4891 Unspecified atrial fibrillation: Secondary | ICD-10-CM | POA: Insufficient documentation

## 2014-09-28 DIAGNOSIS — Z7901 Long term (current) use of anticoagulants: Secondary | ICD-10-CM | POA: Insufficient documentation

## 2014-09-28 DIAGNOSIS — I480 Paroxysmal atrial fibrillation: Secondary | ICD-10-CM | POA: Diagnosis present

## 2014-09-28 DIAGNOSIS — I5022 Chronic systolic (congestive) heart failure: Secondary | ICD-10-CM | POA: Diagnosis not present

## 2014-09-28 DIAGNOSIS — I481 Persistent atrial fibrillation: Secondary | ICD-10-CM | POA: Diagnosis not present

## 2014-09-28 DIAGNOSIS — Z8249 Family history of ischemic heart disease and other diseases of the circulatory system: Secondary | ICD-10-CM | POA: Insufficient documentation

## 2014-09-28 DIAGNOSIS — I10 Essential (primary) hypertension: Secondary | ICD-10-CM | POA: Diagnosis not present

## 2014-09-28 HISTORY — PX: CARDIOVERSION: SHX1299

## 2014-09-28 SURGERY — CARDIOVERSION
Anesthesia: Monitor Anesthesia Care

## 2014-09-28 MED ORDER — LIDOCAINE HCL (CARDIAC) 20 MG/ML IV SOLN
INTRAVENOUS | Status: DC | PRN
  Administered 2014-09-28: 60 mg via INTRAVENOUS

## 2014-09-28 MED ORDER — PROPOFOL 10 MG/ML IV BOLUS
INTRAVENOUS | Status: DC | PRN
  Administered 2014-09-28: 60 mg via INTRAVENOUS

## 2014-09-28 MED ORDER — SODIUM CHLORIDE 0.9 % IV SOLN
INTRAVENOUS | Status: DC
  Administered 2014-09-28: 11:00:00 via INTRAVENOUS

## 2014-09-28 NOTE — Interval H&P Note (Signed)
History and Physical Interval Note:  09/28/2014 10:50 AM  Cynthia Wright  has presented today for surgery, with the diagnosis of AFIB  The various methods of treatment have been discussed with the patient and family. After consideration of risks, benefits and other options for treatment, the patient has consented to  Procedure(s): CARDIOVERSION (N/A) as a surgical intervention .  The patient's history has been reviewed, patient examined, no change in status, stable for surgery.  I have reviewed the patient's chart and labs.  Questions were answered to the patient's satisfaction.     Dorien Bessent R

## 2014-09-28 NOTE — Anesthesia Postprocedure Evaluation (Signed)
  Anesthesia Post-op Note  Patient: Cynthia Wright  Procedure(s) Performed: Procedure(s) (LRB): CARDIOVERSION (N/A)  Patient Location: PACU  Anesthesia Type: General  Level of Consciousness: awake and alert   Airway and Oxygen Therapy: Patient Spontanous Breathing  Post-op Pain: mild  Post-op Assessment: Post-op Vital signs reviewed, Patient's Cardiovascular Status Stable, Respiratory Function Stable, Patent Airway and No signs of Nausea or vomiting  Last Vitals:  Filed Vitals:   09/28/14 1113  BP: 124/56  Pulse: 49  Temp:   Resp: 18    Post-op Vital Signs: stable   Complications: No apparent anesthesia complications

## 2014-09-28 NOTE — Anesthesia Preprocedure Evaluation (Addendum)
Anesthesia Evaluation  Patient identified by MRN, date of birth, ID band Patient awake    Reviewed: Allergy & Precautions, NPO status , Patient's Chart, lab work & pertinent test results  Airway Mallampati: II  TM Distance: >3 FB Neck ROM: Full    Dental no notable dental hx. (+) Teeth Intact, Caps   Pulmonary neg pulmonary ROS, former smoker,  breath sounds clear to auscultation  Pulmonary exam normal       Cardiovascular hypertension, Pt. on medications +CHF Rhythm:Irregular Rate:Normal  diastolic CHF (congestive heart failure)  EF now 65% by echo 10/2012 with grade II diasotlid dysfunction Chronic systolic congestive heart failure  resolved      Neuro/Psych negative neurological ROS  negative psych ROS   GI/Hepatic negative GI ROS, Neg liver ROS,   Endo/Other  negative endocrine ROSdiabetes  Renal/GU negative Renal ROS  negative genitourinary   Musculoskeletal negative musculoskeletal ROS (+)   Abdominal   Peds negative pediatric ROS (+)  Hematology negative hematology ROS (+)   Anesthesia Other Findings   Reproductive/Obstetrics negative OB ROS                            Anesthesia Physical Anesthesia Plan  ASA: III  Anesthesia Plan: MAC   Post-op Pain Management:    Induction: Intravenous  Airway Management Planned: Mask  Additional Equipment:   Intra-op Plan:   Post-operative Plan:   Informed Consent: I have reviewed the patients History and Physical, chart, labs and discussed the procedure including the risks, benefits and alternatives for the proposed anesthesia with the patient or authorized representative who has indicated his/her understanding and acceptance.   Dental advisory given  Plan Discussed with: Surgeon and CRNA  Anesthesia Plan Comments:         Anesthesia Quick Evaluation

## 2014-09-28 NOTE — Interval H&P Note (Signed)
History and Physical Interval Note:  09/28/2014 8:22 AM  Cynthia Wright  has presented today for surgery, with the diagnosis of AFIB  The various methods of treatment have been discussed with the patient and family. After consideration of risks, benefits and other options for treatment, the patient has consented to  Procedure(s): CARDIOVERSION (N/A) as a surgical intervention .  The patient's history has been reviewed, patient examined, no change in status, stable for surgery.  I have reviewed the patient's chart and labs.  Questions were answered to the patient's satisfaction.     Rickey Farrier R

## 2014-09-28 NOTE — Anesthesia Procedure Notes (Signed)
Procedure Name: MAC Date/Time: 09/28/2014 10:56 AM Performed by: Eligha Bridegroom Pre-anesthesia Checklist: Patient identified, Timeout performed, Emergency Drugs available, Suction available and Patient being monitored Patient Re-evaluated:Patient Re-evaluated prior to inductionOxygen Delivery Method: Ambu bag Preoxygenation: Pre-oxygenation with 100% oxygen Intubation Type: IV induction Ventilation: Mask ventilation without difficulty

## 2014-09-28 NOTE — Transfer of Care (Signed)
Immediate Anesthesia Transfer of Care Note  Patient: Cynthia Wright  Procedure(s) Performed: Procedure(s): CARDIOVERSION (N/A)  Patient Location: PACU and Endoscopy Unit  Anesthesia Type:MAC  Level of Consciousness: awake, alert  and oriented  Airway & Oxygen Therapy: Patient Spontanous Breathing and Patient connected to nasal cannula oxygen  Post-op Assessment: Report given to RN and Post -op Vital signs reviewed and stable  Post vital signs: Reviewed and stable  Last Vitals:  Filed Vitals:   09/28/14 1108  BP:   Pulse:   Temp:   Resp: 22    Complications: No apparent anesthesia complications

## 2014-09-28 NOTE — Discharge Instructions (Signed)

## 2014-09-28 NOTE — H&P (View-Only) (Signed)
Patient ID: Cynthia Wright, female   DOB: [birth year], 81 y.o.   MRN: [id number]      Primary Care Physician: Milagros Evener, MD Referring Physician:Dr. Ashok Norris   Cynthia Wright is a 81 y.o. female with a h/o PAF that went to her PCP two weeks ago and was found to be in afib with RVR. Pt was unaware of afib. The only thing out of her usual health is that she went on a trip the week before and forgot her lasix. She felt like she had gained a few pounds of water weight after the trip. Dr. Radford Pax called in Cardizem 180 mg q day, which she started  last Thursday, for additional rate control and she has experienced lower extremity edema and has had an additional 4 lbs of fluid gain.  She is on 100 mg of amiodarone as a maintenance dose. Has noticed mild dizziness.Takes apixaban on a regular basis.  She returns today after amiodarone increased to 200 mg bid, Lasix increased for fluid weight and Cardizem stopped and metoprolol started.She has lost 7 lbs of fluid and over all feels better. However, instead of decreasing lasix back to 20 mg daily after increased dose of 40 mg qd x 3 days, she has continued on the higher dose. She however remains in afib with RVR, tolerating it well, feeling mostly fatigue, and cardioversion was discussed today since higher dose amiodarone has not converted to SR. She has not missed any does of apixaban.  Today, she denies symptoms of palpitations, chest pain, shortness of breath, orthopnea, PND,  dizziness, presyncope, syncope, or neurologic sequela. Biggest concern is that of fatigue over the last few weeks. The patient is tolerating medications without difficulties and is otherwise without complaint today.   Past Medical History  Diagnosis Date  . Hypertension   . Diabetes mellitus without complication   . Hyperlipidemia   . Atrial fibrillation     s/p DCCV 07/2012  . Chronic anticoagulation   . Dilated cardiomyopathy secondary to tachycardia 07/2012    2D  echo 10/2012 showed EF 65% with grade II diastolic dysfunction  . Liver cyst   . Elevated cholesterol   . Benign diastolic hypertension   . Encounter for long-term (current) use of other medications   . Chronic diastolic CHF (congestive heart failure)     EF now 65% by echo 10/2012 with grade II diasotlid dysfunction  . Chronic systolic congestive heart failure     resolved   Past Surgical History  Procedure Laterality Date  . Back surgery      herniated disc  . Tee without cardioversion N/A 08/09/2012    Procedure: TRANSESOPHAGEAL ECHOCARDIOGRAM (TEE);  Surgeon: Sueanne Margarita, MD;  Location: [facility] ENDOSCOPY;  Service: Cardiovascular;  Laterality: N/A;  talk to angie in [place].  . Cardioversion N/A 08/09/2012    Procedure: CARDIOVERSION;  Surgeon: Sueanne Margarita, MD;  Location: [facility] ENDOSCOPY;  Service: Cardiovascular;  Laterality: N/A;    Current Outpatient Prescriptions  Medication Sig Dispense Refill  . amiodarone (PACERONE) 200 MG tablet Take 1 tablet (200 mg total) by mouth 2 (two) times daily. 30 tablet 6  . apixaban (ELIQUIS) 2.5 MG TABS tablet Take 1 tablet (2.5 mg total) by mouth 2 (two) times daily. 60 tablet 11  . furosemide (LASIX) 20 MG tablet Take 20 mg by mouth daily.    [name] Kitchen glucose blood test strip TEST BLOOD GLUCOSE TWICE A DAY AND RECORD    . lisinopril (PRINIVIL,ZESTRIL) 40 MG tablet  TAKE 1 TABLET (40 MG TOTAL) BY MOUTH DAILY. 30 tablet 6  . metoprolol succinate (TOPROL XL) 25 MG 24 hr tablet Take 1 tablet (25 mg total) by mouth daily. 30 tablet 1  . Multiple Vitamin (MULTIVITAMIN WITH MINERALS) TABS Take 1 tablet by mouth daily.    [name] DELICA LANCETS 69C MISC CHECK BLOOD GLUCOSE TWICE A DAY AND RECORD    . OVER THE COUNTER MEDICATION Take 2 stool softener capsules before bedtime    . OVER THE COUNTER MEDICATION Place 3 drops into both eyes daily as needed (burning sensation in eyes). Ensure rewetting drops    . potassium chloride SA (K-DUR,KLOR-CON) 20 MEQ tablet Take  two tablets every other day 30 tablet 6  . rosuvastatin (CRESTOR) 5 MG tablet Take 5 mg by mouth daily.    . sitaGLIPtin (JANUVIA) 100 MG tablet Take 100 mg by mouth daily.    [name] Kitchen zolpidem (AMBIEN) 5 MG tablet Take 2.5-5 mg by mouth at bedtime as needed for sleep.      No current facility-administered medications for this encounter.    Allergies  Allergen Reactions  . Codeine Other (See Comments)    "Just get sick"  . Lortab [Hydrocodone-Acetaminophen] Other (See Comments)    "Just get sick"    History   Social History  . Marital Status: Single    Spouse Name: N/A  . Number of Children: N/A  . Years of Education: N/A   Occupational History  . Not on file.   Social History Main Topics  . Smoking status: Former Research scientist (life sciences)  . Smokeless tobacco: Never Used  . Alcohol Use: 0.0 oz/week    1-2 Glasses of wine per week  . Drug Use: No  . Sexual Activity: Not on file   Other Topics Concern  . Not on file   Social History Narrative   Divorced, 3 children   Right handed   Some college   2 cups daily     Family History  Problem Relation Age of Onset  . Heart disease Mother   . Heart disease Father   . CAD Mother   . CAD Father   . Pancreatic cancer Brother     ROS- All systems are reviewed and negative except as per the HPI above  Physical Exam: Filed Vitals:   09/26/14 1105  BP: 126/78  Pulse: 115  Weight: 130 lb (58.968 kg)  SpO2: 98%    GEN- The patient is well appearing, alert and oriented x 3 today.   Head- normocephalic, atraumatic Eyes-  Sclera clear, conjunctiva pink Ears- hearing intact Oropharynx- clear Neck- supple, no JVP Lymph- no cervical lymphadenopathy Lungs- Clear to ausculation bilaterally, normal work of breathing Heart-Rapid rate and  rhythm, no murmurs, rubs or gallops, PMI not laterally displaced GI- soft, NT, ND, + BS Extremities- no clubbing, cyanosis, or 2t pitting edema. MS- no significant deformity or atrophy Skin- no rash or  lesion Psych- euthymic mood, full affect Neuro- strength and sensation are intact  EKG- Afib with RVR at 124 bpm, QRS 88 ms, QTc 502 ms   Assessment and Plan:  1. Afib with RVR Increase metoprolol ER  to 25 mg 11/2 tab once a day for better rate control.  Continue with Amiodarone 200 mg bid Will schedule for DCCV since the higher dose of amiodarone has not restored SR She is compliant with apixaban and reminded not to miss any doses. Pre procedure labs as well thyroid panel since TSH was elevated on  last labs.  2.Chronic diastolic heart failure/ LLE and weight gain Resolved Go back to lasix 20 mg a day  3. HTN Stable   F/u with Dr. Radford Pax post cardioversion

## 2014-09-28 NOTE — CV Procedure (Signed)
                    Electrical Cardioversion Procedure Note Cynthia Wright [id number] Apr 03, 1924  Procedure: Electrical Cardioversion Indications:  Atrial Fibrillation  Time Out: Verified patient identification, verified procedure,medications/allergies/relevent history reviewed, required imaging and test results available.  Performed  Procedure Details  The patient was NPO after midnight. Anesthesia was administered at the beside  by Dr.Rose with 60mg  of propofol and 60mg  of Lidocaine.  Cardioversion was done with synchronized biphasic defibrillation with AP pads with 150 watts.  The patient converted to normal sinus rhythm. The patient tolerated the procedure well   IMPRESSION:  Successful cardioversion of atrial fibrillation    Almedia Cordell R 09/28/2014, 10:52 AM

## 2014-09-29 ENCOUNTER — Encounter (HOSPITAL_COMMUNITY): Payer: Self-pay | Admitting: Cardiology

## 2014-09-29 ENCOUNTER — Other Ambulatory Visit (HOSPITAL_COMMUNITY): Payer: Self-pay | Admitting: *Deleted

## 2014-09-29 MED ORDER — FUROSEMIDE 20 MG PO TABS: 20.0000 mg | ORAL_TABLET | Freq: Every day | ORAL | Status: DC

## 2014-10-06 ENCOUNTER — Ambulatory Visit (HOSPITAL_COMMUNITY)
Admit: 2014-10-06 | Discharge: 2014-10-06 | Disposition: A | Discharge status: Home / Self Care | Payer: Medicare Other | Source: Ambulatory Visit | Attending: Nurse Practitioner | Admitting: Nurse Practitioner

## 2014-10-06 ENCOUNTER — Encounter (HOSPITAL_COMMUNITY): Payer: Self-pay | Admitting: Nurse Practitioner

## 2014-10-06 VITALS — BP 130/80 | HR 104 | Ht 65.0 in | Wt 132.6 lb

## 2014-10-06 DIAGNOSIS — I481 Persistent atrial fibrillation: Secondary | ICD-10-CM

## 2014-10-06 DIAGNOSIS — Z7902 Long term (current) use of antithrombotics/antiplatelets: Secondary | ICD-10-CM | POA: Insufficient documentation

## 2014-10-06 DIAGNOSIS — I10 Essential (primary) hypertension: Secondary | ICD-10-CM | POA: Insufficient documentation

## 2014-10-06 DIAGNOSIS — E119 Type 2 diabetes mellitus without complications: Secondary | ICD-10-CM | POA: Diagnosis not present

## 2014-10-06 DIAGNOSIS — Z8249 Family history of ischemic heart disease and other diseases of the circulatory system: Secondary | ICD-10-CM | POA: Diagnosis not present

## 2014-10-06 DIAGNOSIS — I5032 Chronic diastolic (congestive) heart failure: Secondary | ICD-10-CM | POA: Diagnosis not present

## 2014-10-06 DIAGNOSIS — E785 Hyperlipidemia, unspecified: Secondary | ICD-10-CM | POA: Diagnosis not present

## 2014-10-06 DIAGNOSIS — Z79899 Other long term (current) drug therapy: Secondary | ICD-10-CM | POA: Diagnosis not present

## 2014-10-06 DIAGNOSIS — I4891 Unspecified atrial fibrillation: Secondary | ICD-10-CM | POA: Insufficient documentation

## 2014-10-06 DIAGNOSIS — Z87891 Personal history of nicotine dependence: Secondary | ICD-10-CM | POA: Diagnosis not present

## 2014-10-06 DIAGNOSIS — I4819 Other persistent atrial fibrillation: Secondary | ICD-10-CM

## 2014-10-06 MED ORDER — AMIODARONE HCL 200 MG PO TABS: 200.0000 mg | ORAL_TABLET | Freq: Two times a day (BID) | ORAL | Status: DC

## 2014-10-06 NOTE — Patient Instructions (Signed)
Amiodarone 200mg  Twice a day

## 2014-10-06 NOTE — Progress Notes (Signed)
Patient ID: Cynthia Wright, female   DOB: 1924/05/16, 81 y.o.   MRN: [id number]      Primary Care Physician: Cynthia Evener, MD Referring Physician:Dr. Ashok Wright   Cynthia Wright is a 81 y.o. female with a h/o PAF that recently went to her PCP last week and was found to be in afib with RVR. Pt was unaware of afib. The only thing out of her usual health is that she went on a trip the week before and forgot her lasix. She felt like she had gained a few pounds of water weight after the trip. Dr. Radford Pax called in Cardizem 180 mg q day, which she started  last Thursday, for additional rate control and she has experienced lower extremity edema and  had an additional 4 lbs of fluid gain.  She was on 100 mg of amiodarone as a maintenance dose. Has noticed mild dizziness.Takes apixaban on a regular basis.  Amiodarone dose was doubled and she was set up for cardioversion after the dose was increased. She also was taken off the Cardizem due to swelling and lasix was increased for several days with loss of fluid weight. She had DCCV 6/30, which was successful. She returns today with ERAF. After much discussion with the patient, she never increased her dose of amiodarone. She is symptomatic with afib with  fatigue. Fairly well rate controlled.  Today, she denies symptoms of palpitations, chest pain, shortness of breath, orthopnea, PND,  dizziness, presyncope, syncope, or neurologic sequela. The patient is tolerating medications without difficulties and is otherwise without complaint today.   Past Medical History  Diagnosis Date  . Hypertension   . Diabetes mellitus without complication   . Hyperlipidemia   . Atrial fibrillation     s/p DCCV 07/2012  . Chronic anticoagulation   . Dilated cardiomyopathy secondary to tachycardia 07/2012    2D echo 10/2012 showed EF 65% with grade II diastolic dysfunction  . Liver cyst   . Elevated cholesterol   . Benign diastolic hypertension   . Encounter for  long-term (current) use of other medications   . Chronic diastolic CHF (congestive heart failure)     EF now 65% by echo 10/2012 with grade II diasotlid dysfunction  . Chronic systolic congestive heart failure     resolved   Past Surgical History  Procedure Laterality Date  . Back surgery      herniated disc  . Tee without cardioversion N/A 08/09/2012    Procedure: TRANSESOPHAGEAL ECHOCARDIOGRAM (TEE);  Surgeon: Cynthia Margarita, MD;  Location: [facility] ENDOSCOPY;  Service: Cardiovascular;  Laterality: N/A;  talk to angie in [place].  . Cardioversion N/A 08/09/2012    Procedure: CARDIOVERSION;  Surgeon: Cynthia Margarita, MD;  Location: [facility] ENDOSCOPY;  Service: Cardiovascular;  Laterality: N/A;  . Cardioversion N/A 09/28/2014    Procedure: CARDIOVERSION;  Surgeon: Cynthia Margarita, MD;  Location: [facility] ENDOSCOPY;  Service: Cardiovascular;  Laterality: N/A;    Current Outpatient Prescriptions  Medication Sig Dispense Refill  . amiodarone (PACERONE) 200 MG tablet Take 1 tablet (200 mg total) by mouth 2 (two) times daily. 60 tablet 1  . apixaban (ELIQUIS) 2.5 MG TABS tablet Take 1 tablet (2.5 mg total) by mouth 2 (two) times daily. 60 tablet 11  . furosemide (LASIX) 20 MG tablet Take 1 tablet (20 mg total) by mouth daily. 30 tablet 6  . glucose blood test strip TEST BLOOD GLUCOSE TWICE A DAY AND RECORD    . lisinopril (PRINIVIL,ZESTRIL) 40 MG tablet  TAKE 1 TABLET (40 MG TOTAL) BY MOUTH DAILY. 30 tablet 6  . metoprolol succinate (TOPROL XL) 25 MG 24 hr tablet Take 1.5 tablets (37.5 mg total) by mouth daily. 45 tablet 6  . Multiple Vitamin (MULTIVITAMIN WITH MINERALS) TABS Take 1 tablet by mouth daily.    [name] DELICA LANCETS 32Z MISC CHECK BLOOD GLUCOSE TWICE A DAY AND RECORD    . OVER THE COUNTER MEDICATION Take 2 stool softener capsules before bedtime    . OVER THE COUNTER MEDICATION Place 3 drops into both eyes daily as needed (burning sensation in eyes). Ensure rewetting drops    . potassium chloride SA  (K-DUR,KLOR-CON) 20 MEQ tablet Take two tablets every other day 30 tablet 6  . rosuvastatin (CRESTOR) 5 MG tablet Take 5 mg by mouth daily.    . sitaGLIPtin (JANUVIA) 100 MG tablet Take 100 mg by mouth daily.    Cynthia Wright zolpidem (AMBIEN) 5 MG tablet Take 2.5-5 mg by mouth at bedtime as needed for sleep.      No current facility-administered medications for this encounter.    Allergies  Allergen Reactions  . Codeine Other (See Comments)    "Just get sick"  . Lortab [Hydrocodone-Acetaminophen] Other (See Comments)    "Just get sick"    History   Social History  . Marital Status: Single    Spouse Name: N/A  . Number of Children: N/A  . Years of Education: N/A   Occupational History  . Not on file.   Social History Main Topics  . Smoking status: Former Research scientist (life sciences)  . Smokeless tobacco: Never Used  . Alcohol Use: 0.0 oz/week    1-2 Glasses of wine per week  . Drug Use: No  . Sexual Activity: Not on file   Other Topics Concern  . Not on file   Social History Narrative   Divorced, 3 children   Right handed   Some college   2 cups daily     Family History  Problem Relation Age of Onset  . Heart disease Mother   . Heart disease Father   . CAD Mother   . CAD Father   . Pancreatic cancer Brother     ROS- All systems are reviewed and negative except as per the HPI above  Physical Exam: Filed Vitals:   10/06/14 0937  BP: 130/80  Pulse: 104  Height: 5\' 5"  (1.651 m)  Weight: 132 lb 9.6 oz (60.147 kg)    GEN- The patient is well appearing, alert and oriented x 3 today.   Head- normocephalic, atraumatic Eyes-  Sclera clear, conjunctiva pink Ears- hearing intact Oropharynx- clear Neck- supple, no JVP Lymph- no cervical lymphadenopathy Lungs- Clear to ausculation bilaterally, normal work of breathing Heat IRRR,, no murmurs, rubs or gallops, PMI not laterally displaced GI- soft, NT, ND, + BS Extremities- no clubbing, cyanosis, or 2t pitting edema. MS- no significant  deformity or atrophy Skin- no rash or lesion Psych- euthymic mood, full affect Neuro- strength and sensation are intact  EKG- Afib with RVR at 104 bpm, QRS 98 ms, QTc 447 ms   Assessment and Plan: 1. Afib with RVR Had successful DCCV but ERAF, however just found out today that pt failed to increase amiodarone dose. Increase to 200 mg bid  and will bring back in two weeks. If she has not had conversion to SR, I think will be worthwhile to pursue DCCV again since she is symptomatic in afib.   2. LLE  Continue lasix.  Stable    3. HTN Stable   4. TSH elevated Full thyroid panel normal.  F/u in afib clinic in 2 weeks.

## 2014-10-09 ENCOUNTER — Encounter: Payer: Self-pay | Admitting: *Deleted

## 2014-10-19 ENCOUNTER — Encounter (HOSPITAL_COMMUNITY): Payer: Self-pay | Admitting: Nurse Practitioner

## 2014-10-19 ENCOUNTER — Ambulatory Visit (HOSPITAL_COMMUNITY)
Admission: RE | Admit: 2014-10-19 | Discharge: 2014-10-19 | Disposition: A | Discharge status: Home / Self Care | Payer: Medicare Other | Source: Ambulatory Visit | Attending: Nurse Practitioner | Admitting: Nurse Practitioner

## 2014-10-19 VITALS — BP 122/76 | HR 57 | Ht 65.0 in | Wt 128.0 lb

## 2014-10-19 DIAGNOSIS — I110 Hypertensive heart disease with heart failure: Secondary | ICD-10-CM | POA: Diagnosis not present

## 2014-10-19 DIAGNOSIS — I5032 Chronic diastolic (congestive) heart failure: Secondary | ICD-10-CM | POA: Diagnosis not present

## 2014-10-19 DIAGNOSIS — I481 Persistent atrial fibrillation: Secondary | ICD-10-CM | POA: Diagnosis not present

## 2014-10-19 DIAGNOSIS — I4819 Other persistent atrial fibrillation: Secondary | ICD-10-CM

## 2014-10-19 DIAGNOSIS — I4891 Unspecified atrial fibrillation: Secondary | ICD-10-CM | POA: Diagnosis not present

## 2014-10-19 MED ORDER — METOPROLOL SUCCINATE ER 25 MG PO TB24: 25.0000 mg | ORAL_TABLET | Freq: Every day | ORAL | Status: DC

## 2014-10-19 NOTE — Patient Instructions (Signed)
Decrease metoprolol to one tablet 25 mg a day. F/u in 2 weeks August 4th at 10:30 am. Continue amiodarone at 200 mg one tablet twice a day.

## 2014-10-19 NOTE — Progress Notes (Signed)
Patient ID: Cynthia Wright, female   DOB: 1925-03-25, 81 y.o.   MRN: [id number]      Primary Care Physician: Milagros Evener, MD Referring Physician:Dr. Ashok Norris   Cynthia Wright is a 81 y.o. female with a h/o PAF that went to her PCP two weeks ago and was found to be in afib with RVR. Pt was unaware of afib. The only thing out of her usual health is that she went on a trip the week before and forgot her lasix. She felt like she had gained a few pounds of water weight after the trip. Dr. Radford Pax called in Cardizem 180 mg q day, which she started  last Thursday, for additional rate control and she has experienced lower extremity edema and has had an additional 4 lbs of fluid gain.  She is on 100 mg of amiodarone as a maintenance dose. Has noticed mild dizziness.Takes apixaban on a regular basis.   Amiodarone increased to 200 mg bid, Lasix increased for fluid weight and Cardizem stopped and metoprolol started.She   lost 7 lbs of fluid and over all feels better.  She was set up for cardioversion and unfortunately had ERAF. Upon questioning pt further on return from cardioversion, she never increased dose of amiodarone from 100 mg daily. Amiodarone was increased to 200 mg bid and she returns today in SR. She has noticed that she has better energy lately with less fatigue.  Today, she denies symptoms of palpitations, chest pain, shortness of breath, orthopnea, PND,  dizziness, presyncope, syncope, or neurologic sequela. . The patient is tolerating medications without difficulties and is otherwise without complaint today.   Past Medical History  Diagnosis Date  . Hypertension   . Diabetes mellitus without complication   . Hyperlipidemia   . Atrial fibrillation     s/p DCCV 07/2012  . Chronic anticoagulation   . Dilated cardiomyopathy secondary to tachycardia 07/2012    2D echo 10/2012 showed EF 65% with grade II diastolic dysfunction  . Liver cyst   . Elevated cholesterol   . Benign diastolic  hypertension   . Encounter for long-term (current) use of other medications   . Chronic diastolic CHF (congestive heart failure)     EF now 65% by echo 10/2012 with grade II diasotlid dysfunction  . Chronic systolic congestive heart failure     resolved   Past Surgical History  Procedure Laterality Date  . Back surgery      herniated disc  . Tee without cardioversion N/A 08/09/2012    Procedure: TRANSESOPHAGEAL ECHOCARDIOGRAM (TEE);  Surgeon: Sueanne Margarita, MD;  Location: [facility] ENDOSCOPY;  Service: Cardiovascular;  Laterality: N/A;  talk to angie in [place].  . Cardioversion N/A 08/09/2012    Procedure: CARDIOVERSION;  Surgeon: Sueanne Margarita, MD;  Location: [facility] ENDOSCOPY;  Service: Cardiovascular;  Laterality: N/A;  . Cardioversion N/A 09/28/2014    Procedure: CARDIOVERSION;  Surgeon: Sueanne Margarita, MD;  Location: [place] ENDOSCOPY;  Service: Cardiovascular;  Laterality: N/A;    Current Outpatient Prescriptions  Medication Sig Dispense Refill  . amiodarone (PACERONE) 200 MG tablet Take 1 tablet (200 mg total) by mouth 2 (two) times daily. 60 tablet 1  . apixaban (ELIQUIS) 2.5 MG TABS tablet Take 1 tablet (2.5 mg total) by mouth 2 (two) times daily. 60 tablet 11  . furosemide (LASIX) 20 MG tablet Take 1 tablet (20 mg total) by mouth daily. 30 tablet 6  . glucose blood test strip TEST BLOOD GLUCOSE TWICE A DAY AND  RECORD    . lisinopril (PRINIVIL,ZESTRIL) 40 MG tablet TAKE 1 TABLET (40 MG TOTAL) BY MOUTH DAILY. 30 tablet 6  . metoprolol succinate (TOPROL XL) 25 MG 24 hr tablet Take 1 tablet (25 mg total) by mouth daily. 45 tablet 6  . Multiple Vitamin (MULTIVITAMIN WITH MINERALS) TABS Take 1 tablet by mouth daily.    [name] DELICA LANCETS 62U MISC CHECK BLOOD GLUCOSE TWICE A DAY AND RECORD    . OVER THE COUNTER MEDICATION Take 2 stool softener capsules before bedtime    . OVER THE COUNTER MEDICATION Place 3 drops into both eyes daily as needed (burning sensation in eyes). Ensure rewetting drops      . potassium chloride SA (K-DUR,KLOR-CON) 20 MEQ tablet Take two tablets every other day 30 tablet 6  . rosuvastatin (CRESTOR) 5 MG tablet Take 5 mg by mouth daily.    . sitaGLIPtin (JANUVIA) 100 MG tablet Take 100 mg by mouth daily.    [name] Kitchen zolpidem (AMBIEN) 5 MG tablet Take 2.5-5 mg by mouth at bedtime as needed for sleep.      No current facility-administered medications for this encounter.    Allergies  Allergen Reactions  . Codeine Other (See Comments)    "Just get sick"  . Lortab [Hydrocodone-Acetaminophen] Other (See Comments)    "Just get sick"    History   Social History  . Marital Status: Single    Spouse Name: N/A  . Number of Children: N/A  . Years of Education: N/A   Occupational History  . Not on file.   Social History Main Topics  . Smoking status: Former Research scientist (life sciences)  . Smokeless tobacco: Never Used  . Alcohol Use: 0.0 oz/week    1-2 Glasses of wine per week  . Drug Use: No  . Sexual Activity: Not on file   Other Topics Concern  . Not on file   Social History Narrative   Divorced, 3 children   Right handed   Some college   2 cups daily     Family History  Problem Relation Age of Onset  . Heart disease Mother   . Heart disease Father   . CAD Mother   . CAD Father   . Pancreatic cancer Brother     ROS- All systems are reviewed and negative except as per the HPI above  Physical Exam: Filed Vitals:   10/19/14 1127  BP: 122/76  Pulse: 57  Height: 5\' 5"  (1.651 m)  Weight: 128 lb (58.06 kg)    GEN- The patient is well appearing, alert and oriented x 3 today.   Head- normocephalic, atraumatic Eyes-  Sclera clear, conjunctiva pink Ears- hearing intact Oropharynx- clear Neck- supple, no JVP Lymph- no cervical lymphadenopathy Lungs- Clear to ausculation bilaterally, normal work of breathing Heart-Rapid rate and  rhythm, no murmurs, rubs or gallops, PMI not laterally displaced GI- soft, NT, ND, + BS Extremities- no clubbing, cyanosis, or 2t  pitting edema. MS- no significant deformity or atrophy Skin- no rash or lesion Psych- euthymic mood, full affect Neuro- strength and sensation are intact  EKG- ASbrady at 57 bpm, Pr int 200 ms, QRS int 104 ms, QTc 513 ms   Assessment and Plan:  1. Afib  Back in SR with amiodarone load Continue at 200 mg until I see back in 2 weeks Decrease metoprolol er to 25 mg daily.  2.Chronic diastolic heart failure/ LLE and weight gain Fluid stable lasix 20 mg a day  3. HTN Stable  F/u in two weeks F/u with Dr. Radford Pax post cardioversion

## 2014-11-02 ENCOUNTER — Encounter (HOSPITAL_COMMUNITY): Payer: Self-pay | Admitting: Nurse Practitioner

## 2014-11-02 ENCOUNTER — Other Ambulatory Visit: Payer: Self-pay

## 2014-11-02 ENCOUNTER — Ambulatory Visit (HOSPITAL_COMMUNITY)
Admission: RE | Admit: 2014-11-02 | Discharge: 2014-11-02 | Disposition: A | Discharge status: Home / Self Care | Payer: Medicare Other | Source: Ambulatory Visit | Attending: Nurse Practitioner | Admitting: Nurse Practitioner

## 2014-11-02 VITALS — BP 120/70 | HR 58 | Ht 65.0 in | Wt 126.8 lb

## 2014-11-02 DIAGNOSIS — Z7902 Long term (current) use of antithrombotics/antiplatelets: Secondary | ICD-10-CM | POA: Diagnosis not present

## 2014-11-02 DIAGNOSIS — Z79899 Other long term (current) drug therapy: Secondary | ICD-10-CM | POA: Diagnosis not present

## 2014-11-02 DIAGNOSIS — Z8249 Family history of ischemic heart disease and other diseases of the circulatory system: Secondary | ICD-10-CM | POA: Diagnosis not present

## 2014-11-02 DIAGNOSIS — I480 Paroxysmal atrial fibrillation: Secondary | ICD-10-CM | POA: Insufficient documentation

## 2014-11-02 DIAGNOSIS — E119 Type 2 diabetes mellitus without complications: Secondary | ICD-10-CM | POA: Diagnosis not present

## 2014-11-02 DIAGNOSIS — E785 Hyperlipidemia, unspecified: Secondary | ICD-10-CM | POA: Insufficient documentation

## 2014-11-02 DIAGNOSIS — I10 Essential (primary) hypertension: Secondary | ICD-10-CM | POA: Insufficient documentation

## 2014-11-02 DIAGNOSIS — R42 Dizziness and giddiness: Secondary | ICD-10-CM | POA: Diagnosis not present

## 2014-11-02 DIAGNOSIS — I4819 Other persistent atrial fibrillation: Secondary | ICD-10-CM

## 2014-11-02 DIAGNOSIS — I481 Persistent atrial fibrillation: Secondary | ICD-10-CM

## 2014-11-02 DIAGNOSIS — Z87891 Personal history of nicotine dependence: Secondary | ICD-10-CM | POA: Diagnosis not present

## 2014-11-02 DIAGNOSIS — Z885 Allergy status to narcotic agent status: Secondary | ICD-10-CM | POA: Diagnosis not present

## 2014-11-02 LAB — BASIC METABOLIC PANEL
Anion gap: 8 (ref 5–15)
BUN: 13 mg/dL (ref 6–20)
CO2: 29 mmol/L (ref 22–32)
CREATININE: 1.01 mg/dL — AB (ref 0.44–1.00)
Calcium: 9.4 mg/dL (ref 8.9–10.3)
Chloride: 104 mmol/L (ref 101–111)
GFR, EST AFRICAN AMERICAN: 56 mL/min — AB (ref >60)
GFR, EST NON AFRICAN AMERICAN: 49 mL/min — AB (ref >60)
Glucose, Bld: 106 mg/dL — ABNORMAL HIGH (ref 65–99)
Potassium: 4.1 mmol/L (ref 3.5–5.1)
Sodium: 141 mmol/L (ref 135–145)

## 2014-11-02 MED ORDER — METOPROLOL SUCCINATE ER 25 MG PO TB24: 12.5000 mg | ORAL_TABLET | Freq: Every day | ORAL | Status: DC

## 2014-11-02 MED ORDER — FUROSEMIDE 20 MG PO TABS: 10.0000 mg | ORAL_TABLET | Freq: Every day | ORAL | Status: DC

## 2014-11-02 MED ORDER — AMIODARONE HCL 200 MG PO TABS: 200.0000 mg | ORAL_TABLET | Freq: Every day | ORAL | Status: DC

## 2014-11-02 NOTE — Progress Notes (Signed)
Patient ID: Cynthia Wright, female   DOB: [birth year], 81 y.o.   MRN: [id number]     Primary Care Physician: Milagros Evener, MD Referring Physician: Dr. Fransico Him   Cynthia Wright is a 81 y.o. female with a h/o PAF who recently has an increase in amiodarone to 200 mg bid and returned to SR. She reports that her energy is bettor but she is dizzy at times. Her LLE swelling has greatly improved. HR is in the 50's  Today, she denies symptoms of palpitations, chest pain, shortness of breath, orthopnea, PND, lower extremity edema,  presyncope, syncope, or neurologic sequela. Positive for occasional dizziness. The patient is tolerating medications without difficulties and is otherwise without complaint today.   Past Medical History  Diagnosis Date  . Hypertension   . Diabetes mellitus without complication   . Hyperlipidemia   . Atrial fibrillation     s/p DCCV 07/2012  . Chronic anticoagulation   . Dilated cardiomyopathy secondary to tachycardia 07/2012    2D echo 10/2012 showed EF 65% with grade II diastolic dysfunction  . Liver cyst   . Elevated cholesterol   . Benign diastolic hypertension   . Encounter for long-term (current) use of other medications   . Chronic diastolic CHF (congestive heart failure)     EF now 65% by echo 10/2012 with grade II diasotlid dysfunction  . Chronic systolic congestive heart failure     resolved   Past Surgical History  Procedure Laterality Date  . Back surgery      herniated disc  . Tee without cardioversion N/A 08/09/2012    Procedure: TRANSESOPHAGEAL ECHOCARDIOGRAM (TEE);  Surgeon: Sueanne Margarita, MD;  Location: [facility] ENDOSCOPY;  Service: Cardiovascular;  Laterality: N/A;  talk to angie in [place].  . Cardioversion N/A 08/09/2012    Procedure: CARDIOVERSION;  Surgeon: Sueanne Margarita, MD;  Location: [facility] ENDOSCOPY;  Service: Cardiovascular;  Laterality: N/A;  . Cardioversion N/A 09/28/2014    Procedure: CARDIOVERSION;  Surgeon: Sueanne Margarita, MD;   Location: [place] ENDOSCOPY;  Service: Cardiovascular;  Laterality: N/A;    Current Outpatient Prescriptions  Medication Sig Dispense Refill  . amiodarone (PACERONE) 200 MG tablet Take 1 tablet (200 mg total) by mouth daily. 30 tablet 3  . apixaban (ELIQUIS) 2.5 MG TABS tablet Take 1 tablet (2.5 mg total) by mouth 2 (two) times daily. 60 tablet 11  . furosemide (LASIX) 20 MG tablet Take 0.5 tablets (10 mg total) by mouth daily. 30 tablet 3  . glucose blood test strip TEST BLOOD GLUCOSE TWICE A DAY AND RECORD    . lisinopril (PRINIVIL,ZESTRIL) 40 MG tablet TAKE 1 TABLET (40 MG TOTAL) BY MOUTH DAILY. 30 tablet 6  . metoprolol succinate (TOPROL XL) 25 MG 24 hr tablet Take 0.5 tablets (12.5 mg total) by mouth daily. 30 tablet 3  . Multiple Vitamin (MULTIVITAMIN WITH MINERALS) TABS Take 1 tablet by mouth daily.    [name] DELICA LANCETS 44W MISC CHECK BLOOD GLUCOSE TWICE A DAY AND RECORD    . OVER THE COUNTER MEDICATION Take 2 stool softener capsules before bedtime    . OVER THE COUNTER MEDICATION Place 3 drops into both eyes daily as needed (burning sensation in eyes). Ensure rewetting drops    . potassium chloride SA (K-DUR,KLOR-CON) 20 MEQ tablet Take two tablets every other day 30 tablet 6  . rosuvastatin (CRESTOR) 5 MG tablet Take 5 mg by mouth daily.    . sitaGLIPtin (JANUVIA) 100 MG tablet Take 100 mg  by mouth daily.    [name] Kitchen zolpidem (AMBIEN) 5 MG tablet Take 2.5-5 mg by mouth at bedtime as needed for sleep.      No current facility-administered medications for this encounter.    Allergies  Allergen Reactions  . Codeine Other (See Comments)    "Just get sick"  . Lortab [Hydrocodone-Acetaminophen] Other (See Comments)    "Just get sick"    History   Social History  . Marital Status: Single    Spouse Name: N/A  . Number of Children: N/A  . Years of Education: N/A   Occupational History  . Not on file.   Social History Main Topics  . Smoking status: Former Research scientist (life sciences)  . Smokeless  tobacco: Never Used  . Alcohol Use: 0.0 oz/week    1-2 Glasses of wine per week  . Drug Use: No  . Sexual Activity: Not on file   Other Topics Concern  . Not on file   Social History Narrative   Divorced, 3 children   Right handed   Some college   2 cups daily     Family History  Problem Relation Age of Onset  . Heart disease Mother   . Heart disease Father   . CAD Mother   . CAD Father   . Pancreatic cancer Brother     ROS- All systems are reviewed and negative except as per the HPI above  Physical Exam: Filed Vitals:   11/02/14 1058  BP: 120/70  Pulse: 58  Height: 5\' 5"  (1.651 m)  Weight: 126 lb 12.8 oz (57.516 kg)    GEN- The patient is well appearing, alert and oriented x 3 today.   Head- normocephalic, atraumatic Eyes-  Sclera clear, conjunctiva pink Ears- hearing intact Oropharynx- clear Neck- supple, no JVP Lymph- no cervical lymphadenopathy Lungs- Clear to ausculation bilaterally, normal work of breathing Heart- Regular rate and rhythm, no murmurs, rubs or gallops, PMI not laterally displaced GI- soft, NT, ND, + BS Extremities- no clubbing, cyanosis, or edema MS- no significant deformity or atrophy Skin- no rash or lesion Psych- euthymic mood, full affect Neuro- strength and sensation are intact  EKG-Sinus brady at 54 bpm, Pr int 152 ms, QRS ms 92 ms, QTc 472 ms    Assessment and Plan:  1. PAF Maintaining  SR, mildly dizzy at times Decrease amiodarone to 200 mg qd Decrease metoprolol to 12.5 mg a day Decrease lasix to 1/2 tab a day (10 mg), but watch for swelling, wt gain or shortness of breath and if present , return to full tablet. bmet today Continue apixaban  F/u with Dr. Radford Pax in one month  Geroge Baseman. Nahmir Zeidman, [facility] [address] [place], [place] [address] [phone]

## 2014-11-02 NOTE — Patient Instructions (Signed)
Your physician has recommended you make the following change in your medication:  1)Decrease Amiodarone to 200mg  once a day 2)Decrease Lasix to 10mg  (1/2 tablet) once a day 3)Decrease Toprol to 12.5mg  (1/2 tablet) once a day  Follow up with Dr. Radford Pax in 1 month -- scheduler will call to set this up.  Follow up Afib Clinic as needed [phone]

## 2014-11-07 ENCOUNTER — Telehealth: Payer: Self-pay | Admitting: Cardiology

## 2014-11-07 NOTE — Telephone Encounter (Signed)
New message     Pt would like to know if she is to continue taking amlodipine Please call to discuss

## 2014-11-07 NOTE — Telephone Encounter (Signed)
Patient has been taking amlodipine, but does not know if she is supposed to.  Medication was discontinued 6/20, but patient was not instructed to stop.  Informed the patient that Roderic Palau will be consulted and her nurse will call her with instructions.   To Roderic Palau, NP.

## 2014-11-08 NOTE — Telephone Encounter (Signed)
Reviewed with patient what medications she should be taking and instructed patient that her amlodipine had been stopped back in June by Dr. Radford Pax when she was started on cardizem which was eventually stopped due to swelling.  Patient reports her bp has been normal off of amlodipine and cardizem.  At this point she will stay off of amlodipine but will call back if her bp starts trending up where medication is needed.  Patient verbalized understanding and will put a "red X" on her amlodipine so she knows not to take it.

## 2014-11-13 ENCOUNTER — Telehealth (HOSPITAL_COMMUNITY): Payer: Self-pay | Admitting: *Deleted

## 2014-11-13 ENCOUNTER — Ambulatory Visit (HOSPITAL_COMMUNITY)
Admission: RE | Admit: 2014-11-13 | Discharge: 2014-11-13 | Disposition: A | Discharge status: Home / Self Care | Payer: Medicare Other | Source: Ambulatory Visit | Attending: Nurse Practitioner | Admitting: Nurse Practitioner

## 2014-11-13 ENCOUNTER — Encounter (HOSPITAL_COMMUNITY): Payer: Self-pay | Admitting: Nurse Practitioner

## 2014-11-13 VITALS — BP 130/70 | HR 51 | Ht 65.0 in | Wt 126.8 lb

## 2014-11-13 DIAGNOSIS — I480 Paroxysmal atrial fibrillation: Secondary | ICD-10-CM | POA: Insufficient documentation

## 2014-11-13 DIAGNOSIS — Z79899 Other long term (current) drug therapy: Secondary | ICD-10-CM | POA: Diagnosis not present

## 2014-11-13 DIAGNOSIS — Z7902 Long term (current) use of antithrombotics/antiplatelets: Secondary | ICD-10-CM | POA: Insufficient documentation

## 2014-11-13 DIAGNOSIS — E119 Type 2 diabetes mellitus without complications: Secondary | ICD-10-CM | POA: Insufficient documentation

## 2014-11-13 DIAGNOSIS — Z87891 Personal history of nicotine dependence: Secondary | ICD-10-CM | POA: Insufficient documentation

## 2014-11-13 DIAGNOSIS — I5032 Chronic diastolic (congestive) heart failure: Secondary | ICD-10-CM | POA: Diagnosis not present

## 2014-11-13 DIAGNOSIS — I10 Essential (primary) hypertension: Secondary | ICD-10-CM | POA: Diagnosis not present

## 2014-11-13 DIAGNOSIS — Z8249 Family history of ischemic heart disease and other diseases of the circulatory system: Secondary | ICD-10-CM | POA: Insufficient documentation

## 2014-11-13 DIAGNOSIS — I481 Persistent atrial fibrillation: Secondary | ICD-10-CM

## 2014-11-13 DIAGNOSIS — E780 Pure hypercholesterolemia: Secondary | ICD-10-CM | POA: Diagnosis not present

## 2014-11-13 DIAGNOSIS — I4819 Other persistent atrial fibrillation: Secondary | ICD-10-CM

## 2014-11-13 LAB — COMPREHENSIVE METABOLIC PANEL
ALBUMIN: 3.9 g/dL (ref 3.5–5.0)
ALT: 28 U/L (ref 14–54)
AST: 32 U/L (ref 15–41)
Alkaline Phosphatase: 72 U/L (ref 38–126)
Anion gap: 7 (ref 5–15)
BUN: 14 mg/dL (ref 6–20)
CHLORIDE: 105 mmol/L (ref 101–111)
CO2: 30 mmol/L (ref 22–32)
CREATININE: 1.21 mg/dL — AB (ref 0.44–1.00)
Calcium: 9.3 mg/dL (ref 8.9–10.3)
GFR calc Af Amer: 45 mL/min — ABNORMAL LOW (ref >60)
GFR, EST NON AFRICAN AMERICAN: 39 mL/min — AB (ref >60)
GLUCOSE: 128 mg/dL — AB (ref 65–99)
Potassium: 3.8 mmol/L (ref 3.5–5.1)
Sodium: 142 mmol/L (ref 135–145)
Total Bilirubin: 0.5 mg/dL (ref 0.3–1.2)
Total Protein: 7 g/dL (ref 6.5–8.1)

## 2014-11-13 LAB — CBC
HEMATOCRIT: 41.1 % (ref 36.0–46.0)
HEMOGLOBIN: 13.5 g/dL (ref 12.0–15.0)
MCH: 33.2 pg (ref 26.0–34.0)
MCHC: 32.8 g/dL (ref 30.0–36.0)
MCV: 101 fL — ABNORMAL HIGH (ref 78.0–100.0)
Platelets: 194 10*3/uL (ref 150–400)
RBC: 4.07 MIL/uL (ref 3.87–5.11)
RDW: 13.2 % (ref 11.5–15.5)
WBC: 6.9 10*3/uL (ref 4.0–10.5)

## 2014-11-13 LAB — TSH: TSH: 5.838 u[IU]/mL — AB (ref 0.350–4.500)

## 2014-11-13 MED ORDER — FUROSEMIDE 20 MG PO TABS: ORAL_TABLET | ORAL | Status: DC

## 2014-11-13 MED ORDER — AMIODARONE HCL 200 MG PO TABS: 100.0000 mg | ORAL_TABLET | Freq: Every day | ORAL | Status: DC

## 2014-11-13 NOTE — Telephone Encounter (Signed)
Pt called in stating she was feeling dizzy and weakness and wants to come in to be "checked out" by Butch Penny -- unsure if medications have gotten mixed up or if something needs to be adjusted.  Appointment made for 330 today.

## 2014-11-13 NOTE — Progress Notes (Signed)
Patient ID: Cynthia Wright, female   DOB: Aug 13, 1924, 81 y.o.   MRN: [id number]     Primary Care Physician: Milagros Evener, MD Referring Physician: Dr. Fransico Him   Cynthia Wright is a 81 y.o. female with a h/o PAF who recently has an increase in amiodarone to 200 mg bid due to afib with RVR associated with fluid retention and fatigue. She has returned to SR but now has c/o of dizziness, feeling off balance when walking. . She reports that her energy is better in SR.   Her LLE swelling has resolved HR is in the  low 50's On last visit, amiodarone was reduced to 200 mg a day, metoprolol cut in half and lasix cut in half. I see no improvement in heart rate. Even though we go over med changes verbally and write them out for pt, it is not uncommon for her to call in between appointments to question again what is to be changed. I spoke to the son today that it might be a good idea to have him pour the meds for his mother. He states they are looking into having someone stay in the house with her.  Today, she denies symptoms of palpitations, chest pain, shortness of breath, orthopnea, PND, lower extremity edema.   Positive for occasional dizziness/ feeling off balance.    Past Medical History  Diagnosis Date  . Hypertension   . Diabetes mellitus without complication   . Hyperlipidemia   . Atrial fibrillation     s/p DCCV 07/2012  . Chronic anticoagulation   . Dilated cardiomyopathy secondary to tachycardia 07/2012    2D echo 10/2012 showed EF 65% with grade II diastolic dysfunction  . Liver cyst   . Elevated cholesterol   . Benign diastolic hypertension   . Encounter for long-term (current) use of other medications   . Chronic diastolic CHF (congestive heart failure)     EF now 65% by echo 10/2012 with grade II diasotlid dysfunction  . Chronic systolic congestive heart failure     resolved   Past Surgical History  Procedure Laterality Date  . Back surgery      herniated disc  . Tee  without cardioversion N/A 08/09/2012    Procedure: TRANSESOPHAGEAL ECHOCARDIOGRAM (TEE);  Surgeon: Sueanne Margarita, MD;  Location: [facility] ENDOSCOPY;  Service: Cardiovascular;  Laterality: N/A;  talk to angie in anes.  . Cardioversion N/A 08/09/2012    Procedure: CARDIOVERSION;  Surgeon: Sueanne Margarita, MD;  Location: [facility] ENDOSCOPY;  Service: Cardiovascular;  Laterality: N/A;  . Cardioversion N/A 09/28/2014    Procedure: CARDIOVERSION;  Surgeon: Sueanne Margarita, MD;  Location: [facility] ENDOSCOPY;  Service: Cardiovascular;  Laterality: N/A;    Current Outpatient Prescriptions  Medication Sig Dispense Refill  . amiodarone (PACERONE) 200 MG tablet Take 1 tablet (200 mg total) by mouth daily. 30 tablet 3  . apixaban (ELIQUIS) 2.5 MG TABS tablet Take 1 tablet (2.5 mg total) by mouth 2 (two) times daily. 60 tablet 11  . furosemide (LASIX) 20 MG tablet Take 0.5 tablets (10 mg total) by mouth daily. 30 tablet 3  . glucose blood test strip TEST BLOOD GLUCOSE TWICE A DAY AND RECORD    . lisinopril (PRINIVIL,ZESTRIL) 40 MG tablet TAKE 1 TABLET (40 MG TOTAL) BY MOUTH DAILY. 30 tablet 6  . metoprolol succinate (TOPROL XL) 25 MG 24 hr tablet Take 0.5 tablets (12.5 mg total) by mouth daily. 30 tablet 3  . Multiple Vitamin (MULTIVITAMIN WITH MINERALS) TABS Take  1 tablet by mouth daily.    [name] DELICA LANCETS 50Y MISC CHECK BLOOD GLUCOSE TWICE A DAY AND RECORD    . OVER THE COUNTER MEDICATION Take 2 stool softener capsules before bedtime    . OVER THE COUNTER MEDICATION Place 3 drops into both eyes daily as needed (burning sensation in eyes). Ensure rewetting drops    . potassium chloride SA (K-DUR,KLOR-CON) 20 MEQ tablet Take two tablets every other day 30 tablet 6  . rosuvastatin (CRESTOR) 5 MG tablet Take 5 mg by mouth daily.    [name] Kitchen zolpidem (AMBIEN) 5 MG tablet Take 2.5-5 mg by mouth at bedtime as needed for sleep.     . sitaGLIPtin (JANUVIA) 100 MG tablet Take 100 mg by mouth daily.     No current  facility-administered medications for this encounter.    Allergies  Allergen Reactions  . Codeine Other (See Comments)    "Just get sick"  . Lortab [Hydrocodone-Acetaminophen] Other (See Comments)    "Just get sick"    Social History   Social History  . Marital Status: Single    Spouse Name: N/A  . Number of Children: N/A  . Years of Education: N/A   Occupational History  . Not on file.   Social History Main Topics  . Smoking status: Former Research scientist (life sciences)  . Smokeless tobacco: Never Used  . Alcohol Use: 0.0 oz/week    1-2 Glasses of wine per week  . Drug Use: No  . Sexual Activity: Not on file   Other Topics Concern  . Not on file   Social History Narrative   Divorced, 3 children   Right handed   Some college   2 cups daily     Family History  Problem Relation Age of Onset  . Heart disease Mother   . Heart disease Father   . CAD Mother   . CAD Father   . Pancreatic cancer Brother     ROS- All systems are reviewed and negative except as per the HPI above  Physical Exam: Filed Vitals:   11/13/14 1552  BP: 130/70  Pulse: 51  Height: 5\' 5"  (1.651 m)  Weight: 126 lb 12.8 oz (57.516 kg)    GEN- The patient is well appearing, alert and oriented x 3 today.   Head- normocephalic, atraumatic Eyes-  Sclera clear, conjunctiva pink Ears- hearing intact Oropharynx- clear Neck- supple, no JVP Lymph- no cervical lymphadenopathy Lungs- Clear to ausculation bilaterally, normal work of breathing Heart- Regular rate and rhythm, no murmurs, rubs or gallops, PMI not laterally displaced GI- soft, NT, ND, + BS Extremities- no clubbing, cyanosis, or edema MS- no significant deformity or atrophy Skin- no rash or lesion Psych- euthymic mood, full affect Neuro- strength and sensation are intact  EKG-Sinus brady at 54 bpm, Pr int 152 ms, QRS ms 92 ms, QTc 472 ms    Assessment and Plan:  1. PAF Maintaining SR with SVR, associated with dizziness Decrease amiodarone to 100  mg a day Stop metoprolol Stop lasix but watch for swelling, wt gain or shortness of breath and if present, return to its use and call office. cmet  tsh, cbc today Continue apixaban Will be watchful going forward if continues with symptomatic bradycardia, may need PPM    Avy Barlett C. Deaundra Dupriest, [facility] [address] [place], [place] [address] [phone]  Pt called as I was finishing note and she failed to decrease amiodarone several weeks ago and was still taking  200 mg bid. She will continue with plans to decrease to 100 mg a day. Son went home and supervised moms meds and how she taking and found discrepancy.

## 2014-11-13 NOTE — Patient Instructions (Addendum)
Your physician has recommended you make the following change in your medication:  1)STOP Metoprolol  2)STOP daily dose of lasix 3)Use lasix 1/2 tablet in the morning as needed for ankle swelling, shortness of breath or increase in weight 4)DECREASE Amiodarone to 100mg  (1/2 tablet) once a day

## 2014-11-17 ENCOUNTER — Encounter (HOSPITAL_COMMUNITY): Payer: Self-pay | Admitting: Nurse Practitioner

## 2014-11-17 ENCOUNTER — Ambulatory Visit (HOSPITAL_COMMUNITY)
Admission: RE | Admit: 2014-11-17 | Discharge: 2014-11-17 | Disposition: A | Discharge status: Home / Self Care | Payer: Medicare Other | Source: Ambulatory Visit | Attending: Nurse Practitioner | Admitting: Nurse Practitioner

## 2014-11-17 VITALS — BP 180/88 | HR 59 | Ht 65.0 in | Wt 128.6 lb

## 2014-11-17 DIAGNOSIS — Z87891 Personal history of nicotine dependence: Secondary | ICD-10-CM | POA: Insufficient documentation

## 2014-11-17 DIAGNOSIS — I10 Essential (primary) hypertension: Secondary | ICD-10-CM | POA: Diagnosis not present

## 2014-11-17 DIAGNOSIS — Z7902 Long term (current) use of antithrombotics/antiplatelets: Secondary | ICD-10-CM | POA: Diagnosis not present

## 2014-11-17 DIAGNOSIS — I480 Paroxysmal atrial fibrillation: Secondary | ICD-10-CM | POA: Insufficient documentation

## 2014-11-17 DIAGNOSIS — I481 Persistent atrial fibrillation: Secondary | ICD-10-CM

## 2014-11-17 DIAGNOSIS — I4819 Other persistent atrial fibrillation: Secondary | ICD-10-CM

## 2014-11-17 DIAGNOSIS — E119 Type 2 diabetes mellitus without complications: Secondary | ICD-10-CM | POA: Diagnosis not present

## 2014-11-17 DIAGNOSIS — I4891 Unspecified atrial fibrillation: Secondary | ICD-10-CM | POA: Diagnosis present

## 2014-11-17 DIAGNOSIS — Z79899 Other long term (current) drug therapy: Secondary | ICD-10-CM | POA: Diagnosis not present

## 2014-11-17 DIAGNOSIS — E780 Pure hypercholesterolemia: Secondary | ICD-10-CM | POA: Diagnosis not present

## 2014-11-17 DIAGNOSIS — Z8249 Family history of ischemic heart disease and other diseases of the circulatory system: Secondary | ICD-10-CM | POA: Insufficient documentation

## 2014-11-17 MED ORDER — FUROSEMIDE 20 MG PO TABS: 20.0000 mg | ORAL_TABLET | ORAL | Status: DC

## 2014-11-17 MED ORDER — AMLODIPINE BESYLATE 2.5 MG PO TABS: 2.5000 mg | ORAL_TABLET | Freq: Every day | ORAL | Status: DC

## 2014-11-17 NOTE — Progress Notes (Signed)
Patient ID: Cynthia Wright, female   DOB: Sep 15, 1924, 81 y.o.   MRN: [id number]     Primary Care Physician: Milagros Evener, MD Referring Physician: Dr. Fransico Him   Cynthia Wright is a 81 y.o. female with a h/o PAF who recently has an increase in amiodarone to 200 mg bid due to afib with RVR associated with fluid retention and fatigue. She has returned to SR but now has c/o of dizziness, feeling off balance when walking. She reports that her energy is better in SR.   Her LLE swelling has resolved HR is in the  low 50's On last visit, amiodarone was reduced to 200 mg a day, metoprolol cut in half and lasix cut in half. I see no improvement in heart rate. Even though we go over med changes verbally and write them out for pt, it is not uncommon for her to call in between appointments to question again what is to be changed. I spoke to the son on last visit that it might be a good idea to have him pour the meds for his mother. He states they are looking into having someone stay in the house with her.After lsst visit. Pt called back to the office and discovered she had failed to reduce amiodarone to 200 mg a day and was taking still still 200 mg bid.  On last visit, amiodarone was decreased to 100 mg a day, metoprolol was stopped due to bradycardia, dizziness and lasix was held.  She now returns(8/19) feels better, less dizzy and with hr up to 59 bpm. However now her BP is up to 180 sys and her weight is up two lbs. No significant LLE. EKG sinus brady.  Today, she denies symptoms of palpitations, chest pain, shortness of breath, orthopnea, PND, lower extremity edema.   Positive for occasional dizziness/ feeling off balance.    Past Medical History  Diagnosis Date  . Hypertension   . Diabetes mellitus without complication   . Hyperlipidemia   . Atrial fibrillation     s/p DCCV 07/2012  . Chronic anticoagulation   . Dilated cardiomyopathy secondary to tachycardia 07/2012    2D echo 10/2012  showed EF 65% with grade II diastolic dysfunction  . Liver cyst   . Elevated cholesterol   . Benign diastolic hypertension   . Encounter for long-term (current) use of other medications   . Chronic diastolic CHF (congestive heart failure)     EF now 65% by echo 10/2012 with grade II diasotlid dysfunction  . Chronic systolic congestive heart failure     resolved   Past Surgical History  Procedure Laterality Date  . Back surgery      herniated disc  . Tee without cardioversion N/A 08/09/2012    Procedure: TRANSESOPHAGEAL ECHOCARDIOGRAM (TEE);  Surgeon: Sueanne Margarita, MD;  Location: [facility] ENDOSCOPY;  Service: Cardiovascular;  Laterality: N/A;  talk to angie in [place].  . Cardioversion N/A 08/09/2012    Procedure: CARDIOVERSION;  Surgeon: Sueanne Margarita, MD;  Location: [facility] ENDOSCOPY;  Service: Cardiovascular;  Laterality: N/A;  . Cardioversion N/A 09/28/2014    Procedure: CARDIOVERSION;  Surgeon: Sueanne Margarita, MD;  Location: [facility] ENDOSCOPY;  Service: Cardiovascular;  Laterality: N/A;    Current Outpatient Prescriptions  Medication Sig Dispense Refill  . amiodarone (PACERONE) 200 MG tablet Take 0.5 tablets (100 mg total) by mouth daily. 30 tablet 3  . apixaban (ELIQUIS) 2.5 MG TABS tablet Take 1 tablet (2.5 mg total) by mouth 2 (two) times  daily. 60 tablet 11  . glucose blood test strip TEST BLOOD GLUCOSE TWICE A DAY AND RECORD    . lisinopril (PRINIVIL,ZESTRIL) 40 MG tablet TAKE 1 TABLET (40 MG TOTAL) BY MOUTH DAILY. 30 tablet 6  . Multiple Vitamin (MULTIVITAMIN WITH MINERALS) TABS Take 1 tablet by mouth daily.    [name] DELICA LANCETS 94R MISC CHECK BLOOD GLUCOSE TWICE A DAY AND RECORD    . OVER THE COUNTER MEDICATION Take 2 stool softener capsules before bedtime    . OVER THE COUNTER MEDICATION Place 3 drops into both eyes daily as needed (burning sensation in eyes). Ensure rewetting drops    . potassium chloride SA (K-DUR,KLOR-CON) 20 MEQ tablet Take two tablets every other day 30 tablet 6    . rosuvastatin (CRESTOR) 5 MG tablet Take 5 mg by mouth daily.    . sitaGLIPtin (JANUVIA) 100 MG tablet Take 100 mg by mouth daily.    [name] Kitchen zolpidem (AMBIEN) 5 MG tablet Take 2.5-5 mg by mouth at bedtime as needed for sleep.     [name] Kitchen amLODipine (NORVASC) 2.5 MG tablet Take 1 tablet (2.5 mg total) by mouth daily. 30 tablet 3  . furosemide (LASIX) 20 MG tablet Take 1 tablet in the am as needed for swelling, increased weight, shortness of breath (Patient not taking: Reported on 11/17/2014) 30 tablet 3  . furosemide (LASIX) 20 MG tablet Take 1 tablet (20 mg total) by mouth every other day. 30 tablet    No current facility-administered medications for this encounter.    Allergies  Allergen Reactions  . Codeine Other (See Comments)    "Just get sick"  . Lortab [Hydrocodone-Acetaminophen] Other (See Comments)    "Just get sick"    Social History   Social History  . Marital Status: Single    Spouse Name: N/A  . Number of Children: N/A  . Years of Education: N/A   Occupational History  . Not on file.   Social History Main Topics  . Smoking status: Former Research scientist (life sciences)  . Smokeless tobacco: Never Used  . Alcohol Use: 0.0 oz/week    1-2 Glasses of wine per week  . Drug Use: No  . Sexual Activity: Not on file   Other Topics Concern  . Not on file   Social History Narrative   Divorced, 3 children   Right handed   Some college   2 cups daily     Family History  Problem Relation Age of Onset  . Heart disease Mother   . Heart disease Father   . CAD Mother   . CAD Father   . Pancreatic cancer Brother     ROS- All systems are reviewed and negative except as per the HPI above  Physical Exam: Filed Vitals:   11/17/14 1137  BP: 180/88  Pulse: 59  Height: 5\' 5"  (1.651 m)  Weight: 128 lb 9.6 oz (58.333 kg)    GEN- The patient is well appearing, alert and oriented x 3 today.   Head- normocephalic, atraumatic Eyes-  Sclera clear, conjunctiva pink Ears- hearing intact Oropharynx-  clear Neck- supple, no JVP Lymph- no cervical lymphadenopathy Lungs- Clear to ausculation bilaterally, normal work of breathing Heart- Regular rate and rhythm, no murmurs, rubs or gallops, PMI not laterally displaced GI- soft, NT, ND, + BS Extremities- no clubbing, cyanosis, or edema MS- no significant deformity or atrophy Skin- no rash or lesion Psych- euthymic mood, full affect Neuro- strength and sensation are intact  EKG-Sinus brady at 59  bpm, pr int 120 ms, QRS 94 ms, QTc 473 ms. Recent labs reviewed   Assessment and Plan:  1. PAF Maintaining SR with SVR, associated with dizziness Improved Coninue amiodarone to 100 mg a day Off metoprolol Continue apixaban  2. HTN Lasix 20 mg every other day Add amlodipine 2.5 mg every other day F/u in 10 days for BP, HR check  Will need TSH rechecked in a few weeks, should improve with amiodarone dose decreased  F/u with Dr. Radford Pax 10/3   Geroge Baseman. [place], [facility] [address] [place], [place] [address] [phone]

## 2014-11-17 NOTE — Patient Instructions (Signed)
Your physician has recommended you make the following change in your medication:  1) Lasix 20mg  take every other day 2)Amlodipine 2.5mg  once a day (if you have a 5mg  tablet at home then take 1/2 tablet of this)

## 2014-11-27 DIAGNOSIS — G479 Sleep disorder, unspecified: Secondary | ICD-10-CM | POA: Diagnosis not present

## 2014-11-27 DIAGNOSIS — Z23 Encounter for immunization: Secondary | ICD-10-CM | POA: Diagnosis not present

## 2014-11-27 DIAGNOSIS — R4789 Other speech disturbances: Secondary | ICD-10-CM | POA: Diagnosis not present

## 2014-11-27 DIAGNOSIS — R413 Other amnesia: Secondary | ICD-10-CM | POA: Diagnosis not present

## 2014-11-29 ENCOUNTER — Inpatient Hospital Stay (HOSPITAL_COMMUNITY): Admission: RE | Admit: 2014-11-29 | Payer: Medicare Other | Source: Ambulatory Visit | Admitting: Nurse Practitioner

## 2014-11-30 ENCOUNTER — Encounter (HOSPITAL_COMMUNITY): Payer: Self-pay | Admitting: Nurse Practitioner

## 2014-11-30 ENCOUNTER — Ambulatory Visit (HOSPITAL_COMMUNITY)
Admission: RE | Admit: 2014-11-30 | Discharge: 2014-11-30 | Disposition: A | Discharge status: Home / Self Care | Payer: Medicare Other | Source: Ambulatory Visit | Attending: Nurse Practitioner | Admitting: Nurse Practitioner

## 2014-11-30 VITALS — BP 150/78 | HR 65 | Ht 65.0 in | Wt 124.8 lb

## 2014-11-30 DIAGNOSIS — I480 Paroxysmal atrial fibrillation: Secondary | ICD-10-CM | POA: Insufficient documentation

## 2014-11-30 DIAGNOSIS — R42 Dizziness and giddiness: Secondary | ICD-10-CM | POA: Insufficient documentation

## 2014-11-30 DIAGNOSIS — I10 Essential (primary) hypertension: Secondary | ICD-10-CM | POA: Diagnosis not present

## 2014-11-30 DIAGNOSIS — Z87891 Personal history of nicotine dependence: Secondary | ICD-10-CM | POA: Diagnosis not present

## 2014-11-30 NOTE — Progress Notes (Addendum)
Patient ID: Cynthia Wright, female   DOB: 11/17/1924, 81 y.o.   MRN: [id number]     Primary Care Physician: Milagros Evener, MD Referring Physician: Dr. Fransico Him   Cynthia Wright is a 81 y.o. female with a h/o PAF who recently has an increase in amiodarone to 200 mg bid due to afib with RVR associated with fluid retention and fatigue. She has returned to SR but now has c/o of dizziness, feeling off balance when walking. She reports that her energy is better in SR.   Her LLE swelling has resolved HR is in the  low 50's On last visit, amiodarone was reduced to 200 mg a day, metoprolol cut in half and lasix cut in half. I see no improvement in heart rate. Even though we go over med changes verbally and write them out for pt, it is not uncommon for her to call in between appointments to question again what is to be changed. I spoke to the son on last visit that it might be a good idea to have him pour the meds for his mother. He states they are looking into having someone stay in the house with her.After last visit, pt called back to the office and discovered she had failed to reduce amiodarone to 200 mg a day and was taking  still 200 mg bid.  On last visit, amiodarone was decreased to 100 mg a day, metoprolol was stopped due to bradycardia, dizziness and lasix was held.  Returned 8/19, feels better, less dizzy and with hr up to 59 bpm. However now her BP is up to 180 sys and her weight is up two lbs. No significant LLE. EKG sinus brady. Amlodipine 2.5 mg added a day and she was instructed to either take lasix every other day or as needed if she watched weight and ankle edema closely.  On today's visit (9/1), she is feeling much improved with less sensation of being unsteady on her feet. BP is acceptable for age. She has not add any fluid weight gain and is taking lasix as needed.HR is sinus with v rate of 65.  Today, she denies symptoms of palpitations, chest pain, shortness of breath,  orthopnea, PND, lower extremity edema.   Positive for occasional dizziness/ feeling off balance.    Past Medical History  Diagnosis Date  . Hypertension   . Diabetes mellitus without complication   . Hyperlipidemia   . Atrial fibrillation     s/p DCCV 07/2012  . Chronic anticoagulation   . Dilated cardiomyopathy secondary to tachycardia 07/2012    2D echo 10/2012 showed EF 65% with grade II diastolic dysfunction  . Liver cyst   . Elevated cholesterol   . Benign diastolic hypertension   . Encounter for long-term (current) use of other medications   . Chronic diastolic CHF (congestive heart failure)     EF now 65% by echo 10/2012 with grade II diasotlid dysfunction  . Chronic systolic congestive heart failure     resolved   Past Surgical History  Procedure Laterality Date  . Back surgery      herniated disc  . Tee without cardioversion N/A 08/09/2012    Procedure: TRANSESOPHAGEAL ECHOCARDIOGRAM (TEE);  Surgeon: Sueanne Margarita, MD;  Location: [facility] ENDOSCOPY;  Service: Cardiovascular;  Laterality: N/A;  talk to Cynthia Wright in [place]es.  . Cardioversion N/A 08/09/2012    Procedure: CARDIOVERSION;  Surgeon: Sueanne Margarita, MD;  Location: [facility] ENDOSCOPY;  Service: Cardiovascular;  Laterality: N/A;  . Cardioversion  N/A 09/28/2014    Procedure: CARDIOVERSION;  Surgeon: Sueanne Margarita, MD;  Location: [facility] ENDOSCOPY;  Service: Cardiovascular;  Laterality: N/A;    Current Outpatient Prescriptions  Medication Sig Dispense Refill  . amiodarone (PACERONE) 200 MG tablet Take 0.5 tablets (100 mg total) by mouth daily. 30 tablet 3  . amLODipine (NORVASC) 2.5 MG tablet Take 1 tablet (2.5 mg total) by mouth daily. 30 tablet 3  . apixaban (ELIQUIS) 2.5 MG TABS tablet Take 1 tablet (2.5 mg total) by mouth 2 (two) times daily. 60 tablet 11  . furosemide (LASIX) 20 MG tablet Take 1 tablet in the am as needed for swelling, increased weight, shortness of breath 30 tablet 3  . glucose blood test strip TEST BLOOD GLUCOSE TWICE  A DAY AND RECORD    . lisinopril (PRINIVIL,ZESTRIL) 40 MG tablet TAKE 1 TABLET (40 MG TOTAL) BY MOUTH DAILY. 30 tablet 6  . Multiple Vitamin (MULTIVITAMIN WITH MINERALS) TABS Take 1 tablet by mouth daily.    [name] DELICA LANCETS 03T MISC CHECK BLOOD GLUCOSE TWICE A DAY AND RECORD    . OVER THE COUNTER MEDICATION Take 2 stool softener capsules before bedtime    . potassium chloride SA (K-DUR,KLOR-CON) 20 MEQ tablet Take two tablets every other day 30 tablet 6  . rosuvastatin (CRESTOR) 5 MG tablet Take 5 mg by mouth daily.    . sitaGLIPtin (JANUVIA) 100 MG tablet Take 100 mg by mouth daily.    [name] Kitchen zolpidem (AMBIEN) 5 MG tablet Take 2.5-5 mg by mouth at bedtime as needed for sleep.     [name] Kitchen OVER THE COUNTER MEDICATION Place 3 drops into both eyes daily as needed (burning sensation in eyes). Ensure rewetting drops     No current facility-administered medications for this encounter.    Allergies  Allergen Reactions  . Codeine Other (See Comments)    "Just get sick"  . Lortab [Hydrocodone-Acetaminophen] Other (See Comments)    "Just get sick"    Social History   Social History  . Marital Status: Single    Spouse Name: N/A  . Number of Children: N/A  . Years of Education: N/A   Occupational History  . Not on file.   Social History Main Topics  . Smoking status: Former Research scientist (life sciences)  . Smokeless tobacco: Never Used  . Alcohol Use: 0.0 oz/week    1-2 Glasses of wine per week  . Drug Use: No  . Sexual Activity: Not on file   Other Topics Concern  . Not on file   Social History Narrative   Divorced, 3 children   Right handed   Some college   2 cups daily     Family History  Problem Relation Age of Onset  . Heart disease Mother   . Heart disease Father   . CAD Mother   . CAD Father   . Pancreatic cancer Brother     ROS- All systems are reviewed and negative except as per the HPI above  Physical Exam: Filed Vitals:   11/30/14 1041  BP: 150/78  Pulse: 65  Height: 5\' 5"   (1.651 m)  Weight: 124 lb 12.8 oz (56.609 kg)    GEN- The patient is well appearing, alert and oriented x 3 today.   Head- normocephalic, atraumatic Eyes-  Sclera clear, conjunctiva pink Ears- hearing intact Oropharynx- clear Neck- supple, no JVP Lymph- no cervical lymphadenopathy Lungs- Clear to ausculation bilaterally, normal work of breathing Heart- Regular rate and rhythm, no murmurs, rubs or  gallops, PMI not laterally displaced GI- soft, NT, ND, + BS Extremities- no clubbing, cyanosis, or edema MS- no significant deformity or atrophy Skin- no rash or lesion Psych- euthymic mood, full affect Neuro- strength and sensation are intact  EKG-Sinus Rhythm, Pr int 168 ms, QRS 90 ms, QTc 488 ms Recent labs reviewed   Assessment and Plan:  1. PAF Maintaining SR Coninue amiodarone  100 mg a day Off metoprolol Continue apixaban   2. HTN Continue amlodipine/lisinopril  Lasix as neeed  3. Dizziness  Improved   F/u with Dr. Radford Pax 10/3. I had planned to repeat TSH today but pt left before labs could be drawn. I feel that with reduction of Amio from 400 mg qd to current 100 mg that it should normalize. Consideration for recheck with Dr. Theodosia Blender appointment.   Geroge Baseman Maxyne Derocher, [facility] [address] [place], [place] [address] [phone]

## 2014-11-30 NOTE — Addendum Note (Signed)
Encounter addended by: Sherran Needs, NP on: 11/30/2014  1:53 PM<BR>     Documentation filed: Notes Section

## 2014-12-04 ENCOUNTER — Other Ambulatory Visit (HOSPITAL_COMMUNITY): Payer: Self-pay | Admitting: Nurse Practitioner

## 2014-12-06 ENCOUNTER — Ambulatory Visit: Payer: Medicare Other | Admitting: Cardiology

## 2014-12-26 ENCOUNTER — Other Ambulatory Visit: Payer: Self-pay | Admitting: Internal Medicine

## 2015-01-01 ENCOUNTER — Encounter: Payer: Self-pay | Admitting: Cardiology

## 2015-01-01 ENCOUNTER — Ambulatory Visit (INDEPENDENT_AMBULATORY_CARE_PROVIDER_SITE_OTHER): Payer: Commercial Managed Care - HMO | Admitting: Cardiology

## 2015-01-01 VITALS — BP 130/68 | HR 57 | Ht 65.0 in | Wt 125.0 lb

## 2015-01-01 DIAGNOSIS — R55 Syncope and collapse: Secondary | ICD-10-CM

## 2015-01-01 DIAGNOSIS — I10 Essential (primary) hypertension: Secondary | ICD-10-CM

## 2015-01-01 DIAGNOSIS — I480 Paroxysmal atrial fibrillation: Secondary | ICD-10-CM | POA: Diagnosis not present

## 2015-01-01 DIAGNOSIS — R000 Tachycardia, unspecified: Secondary | ICD-10-CM | POA: Diagnosis not present

## 2015-01-01 DIAGNOSIS — I43 Cardiomyopathy in diseases classified elsewhere: Secondary | ICD-10-CM

## 2015-01-01 NOTE — Patient Instructions (Signed)

## 2015-01-01 NOTE — Progress Notes (Signed)
Cardiology Office Note   Date:  01/01/2015   ID:  Cynthia, Wright Aug 01, 1924, MRN [id number]  PCP:  Cynthia Evener, MD    Chief Complaint  Patient presents with  . Atrial Fibrillation    follow up      History of Present Illness: Cynthia Wright is a 81 y.o. 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 She has had several episodes of syncope over the past year and neuro workup was negative. She was referred to EP for loop recorder but patient cancelled first appt with Dr. Lovena Le and then never showed up for second appt due to a family illness. She is doing much better and has not had any syncope. She has not really had any dizziness either but occasionally has some balance issues. She denies any chest pain, SOB, DOE or palpitations. She rarely will have some mild LE edema.       Past Medical History  Diagnosis Date  . Hypertension   . Diabetes mellitus without complication (Cynthia Wright)   . Hyperlipidemia   . Atrial fibrillation (Cynthia Wright)     s/p DCCV 07/2012  . Chronic anticoagulation   . Dilated cardiomyopathy secondary to tachycardia (Cynthia Wright) 07/2012    2D echo 10/2012 showed EF 65% with grade II diastolic dysfunction  . Liver cyst   . Elevated cholesterol   . Benign diastolic hypertension   . Encounter for long-term (current) use of other medications   . Chronic diastolic CHF (congestive heart failure) (HCC)     EF now 65% by echo 10/2012 with grade II diasotlid dysfunction  . Chronic systolic congestive heart failure (Cynthia Wright)     resolved    Past Surgical History  Procedure Laterality Date  . Back surgery      herniated disc  . Tee without cardioversion N/A 08/09/2012    Procedure: TRANSESOPHAGEAL ECHOCARDIOGRAM (TEE);  Surgeon: Sueanne Margarita, MD;  Location: [facility] ENDOSCOPY;  Service: Cardiovascular;  Laterality: N/A;  talk to angie in [place].  .  Cardioversion N/A 08/09/2012    Procedure: CARDIOVERSION;  Surgeon: Sueanne Margarita, MD;  Location: [facility] ENDOSCOPY;  Service: Cardiovascular;  Laterality: N/A;  . Cardioversion N/A 09/28/2014    Procedure: CARDIOVERSION;  Surgeon: Sueanne Margarita, MD;  Location: [facility] ENDOSCOPY;  Service: Cardiovascular;  Laterality: N/A;     Current Outpatient Prescriptions  Medication Sig Dispense Refill  . KLOR-CON M20 20 MEQ tablet Take 2 tablets by mouth every other day.  6  . amiodarone (PACERONE) 200 MG tablet Take 0.5 tablets (100 mg total) by mouth daily. 30 tablet 3  . amiodarone (PACERONE) 200 MG tablet Take 0.5 tablets (100 mg total) by mouth daily. 30 tablet 3  . amLODipine (NORVASC) 2.5 MG tablet Take 1 tablet (2.5 mg total) by mouth daily. 30 tablet 3  . ELIQUIS 2.5 MG TABS tablet TAKE 1 TABLET BY MOUTH TWICE A DAY 60 tablet 5  . furosemide (LASIX) 20 MG tablet Take 1 tablet in the am as needed for swelling, increased weight, shortness of breath 30 tablet 3  . glucose blood test strip TEST BLOOD GLUCOSE TWICE A DAY AND RECORD    . lisinopril (PRINIVIL,ZESTRIL) 40 MG tablet TAKE 1 TABLET (40 MG TOTAL) BY MOUTH DAILY. 30 tablet 6  . Multiple Vitamin (MULTIVITAMIN WITH MINERALS) TABS Take 1  tablet by mouth daily.    [name] DELICA LANCETS 58K MISC CHECK BLOOD GLUCOSE TWICE A DAY AND RECORD    . OVER THE COUNTER MEDICATION Take 2 stool softener capsules before bedtime    . OVER THE COUNTER MEDICATION Place 3 drops into both eyes daily as needed (burning sensation in eyes). Ensure rewetting drops    . rosuvastatin (CRESTOR) 5 MG tablet Take 5 mg by mouth daily.    . sitaGLIPtin (JANUVIA) 100 MG tablet Take 100 mg by mouth daily.    Cynthia Wright zolpidem (AMBIEN) 5 MG tablet Take 2.5-5 mg by mouth at bedtime as needed for sleep.      No current facility-administered medications for this visit.    Allergies:   Codeine and Lortab    Social History:  The patient  reports that she has quit smoking. She has never  used smokeless tobacco. She reports that she drinks alcohol. She reports that she does not use illicit drugs.   Family History:  The patient's family history includes CAD in her father and mother; Heart disease in her father and mother; Pancreatic cancer in her brother.    ROS:  Please see the history of present illness.   Otherwise, review of systems are positive for none.   All other systems are reviewed and negative.    PHYSICAL EXAM: VS:  BP 130/68 mmHg  Pulse 57  Ht 5\' 5"  (1.651 m)  Wt 125 lb (56.7 kg)  BMI 20.80 kg/m2 , BMI Body mass index is 20.8 kg/(m^2). GEN: Well nourished, well developed, in no acute distress HEENT: normal Neck: no JVD, carotid bruits, or masses Cardiac: RRR; no murmurs, rubs, or gallops,no edema  Respiratory:  clear to auscultation bilaterally, normal work of breathing GI: soft, nontender, nondistended, + BS MS: no deformity or atrophy Skin: warm and dry, no rash Neuro:  Strength and sensation are intact Psych: euthymic mood, full affect   EKG:  EKG was ordered today and showed NSR with occasional PAC's and junctional beats    Recent Labs: 11/13/2014: ALT 28; BUN 14; Creatinine, Ser 1.21*; Hemoglobin 13.5; Platelets 194; Potassium 3.8; Sodium 142; TSH 5.838*    Lipid Panel No results found for: CHOL, TRIG, HDL, CHOLHDL, VLDL, LDLCALC, LDLDIRECT    Wt Readings from Last 3 Encounters:  01/01/15 125 lb (56.7 kg)  11/30/14 124 lb 12.8 oz (56.609 kg)  11/17/14 128 lb 9.6 oz (58.333 kg)    ASSESSMENT AND PLAN:  1. Vertigo - resolved after stopping coreg  2. HTN - controlled - continue Lisinopril/amlodipine 3. PAF s/p TEE/DCCV - maintaining NSR . CHADS VASC score of 7. - continue amio/Eliquis 4. Tachycardia induced DCM now resolved  5. Fatigue improved after stopping coreg and cutting back dose of amio  6. Syncope ? Etiology possible bradyarrhythmias vs. orthostasis. Seen by EP and felt to be due to orthostasis. Patient refused ILR. She  has not had any further episodes 7. Chest pain with normal nuclear stress test - pain has resolved     Current medicines are reviewed at length with the patient today.  The patient does not have concerns regarding medicines.  The following changes have been made:  no change  Labs/ tests ordered today: See above Assessment and Plan No orders of the defined types were placed in this encounter.     Disposition:   FU with me in 6 months  Signed, Sueanne Margarita, MD  01/01/2015 11:37 AM    [facility]  [address], [place], [place]  [address] Phone: [phone]; Fax: [phone]    

## 2015-01-08 ENCOUNTER — Telehealth (HOSPITAL_COMMUNITY): Payer: Self-pay | Admitting: *Deleted

## 2015-01-08 NOTE — Telephone Encounter (Signed)
Pt called in stating she just realized her pharmacy filled her metoprolol but her paper work says shes not on metoprolol.  Went over medication list with patient and told her to call drug store to cancel RX refills of the medication and not to take the metoprolol. Patient verbalized understanding.

## 2015-02-03 ENCOUNTER — Other Ambulatory Visit: Payer: Self-pay | Admitting: Cardiology

## 2015-03-18 ENCOUNTER — Other Ambulatory Visit (HOSPITAL_COMMUNITY): Payer: Self-pay | Admitting: Nurse Practitioner

## 2015-04-05 ENCOUNTER — Telehealth: Payer: Self-pay | Admitting: Cardiology

## 2015-04-05 NOTE — Telephone Encounter (Signed)
Pt calling stating that she need a prior auth on Eliquis 2.5 mg tablet and that she had gotten a paper in the mail stating that she needed to contact her doctor. Please advise

## 2015-04-06 ENCOUNTER — Other Ambulatory Visit: Payer: Self-pay

## 2015-04-06 MED ORDER — APIXABAN 2.5 MG PO TABS: 2.5000 mg | ORAL_TABLET | Freq: Two times a day (BID) | ORAL | Status: DC

## 2015-04-06 NOTE — Telephone Encounter (Signed)
Refill of Eliquis 2.5mg  sent to [facility]. Unsure about a PA. I have no Insurance info to do one

## 2015-04-09 ENCOUNTER — Other Ambulatory Visit: Payer: Self-pay

## 2015-04-09 MED ORDER — KLOR-CON M20 20 MEQ PO TBCR: 40.0000 meq | EXTENDED_RELEASE_TABLET | ORAL | Status: DC

## 2015-04-19 ENCOUNTER — Telehealth: Payer: Self-pay

## 2015-04-19 NOTE — Telephone Encounter (Signed)
Prior auth for Eliquis 2.5 mg sent to [facility] 

## 2015-04-20 ENCOUNTER — Telehealth: Payer: Self-pay

## 2015-04-20 NOTE — Telephone Encounter (Signed)
Eliquis 5 mg approved through 03/30/2016. PA- [id number].

## 2015-05-15 DIAGNOSIS — L309 Dermatitis, unspecified: Secondary | ICD-10-CM | POA: Diagnosis not present

## 2015-05-18 DIAGNOSIS — R05 Cough: Secondary | ICD-10-CM | POA: Diagnosis not present

## 2015-05-18 DIAGNOSIS — J101 Influenza due to other identified influenza virus with other respiratory manifestations: Secondary | ICD-10-CM | POA: Diagnosis not present

## 2015-07-17 DIAGNOSIS — I10 Essential (primary) hypertension: Secondary | ICD-10-CM | POA: Diagnosis not present

## 2015-07-17 DIAGNOSIS — E119 Type 2 diabetes mellitus without complications: Secondary | ICD-10-CM | POA: Diagnosis not present

## 2015-07-17 DIAGNOSIS — I480 Paroxysmal atrial fibrillation: Secondary | ICD-10-CM | POA: Diagnosis not present

## 2015-07-17 DIAGNOSIS — G3184 Mild cognitive impairment, so stated: Secondary | ICD-10-CM | POA: Diagnosis not present

## 2015-07-17 DIAGNOSIS — Z7984 Long term (current) use of oral hypoglycemic drugs: Secondary | ICD-10-CM | POA: Diagnosis not present

## 2015-07-17 DIAGNOSIS — G4700 Insomnia, unspecified: Secondary | ICD-10-CM | POA: Diagnosis not present

## 2015-07-17 DIAGNOSIS — Z23 Encounter for immunization: Secondary | ICD-10-CM | POA: Diagnosis not present

## 2015-07-17 DIAGNOSIS — Z0000 Encounter for general adult medical examination without abnormal findings: Secondary | ICD-10-CM | POA: Diagnosis not present

## 2015-08-26 ENCOUNTER — Other Ambulatory Visit (HOSPITAL_COMMUNITY): Payer: Self-pay | Admitting: Nurse Practitioner

## 2015-09-05 ENCOUNTER — Ambulatory Visit (INDEPENDENT_AMBULATORY_CARE_PROVIDER_SITE_OTHER): Payer: Medicare Other | Admitting: Cardiology

## 2015-09-05 ENCOUNTER — Encounter: Payer: Self-pay | Admitting: Cardiology

## 2015-09-05 VITALS — BP 144/78 | HR 82 | Ht 65.0 in | Wt 124.8 lb

## 2015-09-05 DIAGNOSIS — I5032 Chronic diastolic (congestive) heart failure: Secondary | ICD-10-CM | POA: Diagnosis not present

## 2015-09-05 DIAGNOSIS — R000 Tachycardia, unspecified: Secondary | ICD-10-CM | POA: Diagnosis not present

## 2015-09-05 DIAGNOSIS — I480 Paroxysmal atrial fibrillation: Secondary | ICD-10-CM

## 2015-09-05 DIAGNOSIS — I4891 Unspecified atrial fibrillation: Secondary | ICD-10-CM | POA: Diagnosis not present

## 2015-09-05 DIAGNOSIS — I10 Essential (primary) hypertension: Secondary | ICD-10-CM

## 2015-09-05 DIAGNOSIS — R55 Syncope and collapse: Secondary | ICD-10-CM

## 2015-09-05 DIAGNOSIS — I43 Cardiomyopathy in diseases classified elsewhere: Secondary | ICD-10-CM

## 2015-09-05 LAB — BASIC METABOLIC PANEL
BUN: 16 mg/dL (ref 7–25)
CO2: 23 mmol/L (ref 20–31)
Calcium: 8.8 mg/dL (ref 8.6–10.4)
Chloride: 106 mmol/L (ref 98–110)
Creat: 0.99 mg/dL — ABNORMAL HIGH (ref 0.60–0.88)
Glucose, Bld: 135 mg/dL — ABNORMAL HIGH (ref 65–99)
POTASSIUM: 4.1 mmol/L (ref 3.5–5.3)
SODIUM: 142 mmol/L (ref 135–146)

## 2015-09-05 LAB — CBC WITH DIFFERENTIAL/PLATELET
BASOS PCT: 0 %
Basophils Absolute: 0 cells/uL (ref 0–200)
EOS ABS: 57 {cells}/uL (ref 15–500)
Eosinophils Relative: 1 %
HEMATOCRIT: 38.2 % (ref 35.0–45.0)
HEMOGLOBIN: 12.6 g/dL (ref 11.7–15.5)
LYMPHS ABS: 1425 {cells}/uL (ref 850–3900)
LYMPHS PCT: 25 %
MCH: 32.6 pg (ref 27.0–33.0)
MCHC: 33 g/dL (ref 32.0–36.0)
MCV: 98.7 fL (ref 80.0–100.0)
MONO ABS: 399 {cells}/uL (ref 200–950)
MPV: 10.5 fL (ref 7.5–12.5)
Monocytes Relative: 7 %
Neutro Abs: 3819 cells/uL (ref 1500–7800)
Neutrophils Relative %: 67 %
Platelets: 192 10*3/uL (ref 140–400)
RBC: 3.87 MIL/uL (ref 3.80–5.10)
RDW: 13.4 % (ref 11.0–15.0)
WBC: 5.7 10*3/uL (ref 3.8–10.8)

## 2015-09-05 LAB — TSH: TSH: 3.51 mIU/L

## 2015-09-05 LAB — HEPATIC FUNCTION PANEL
ALT: 17 U/L (ref 6–29)
AST: 17 U/L (ref 10–35)
Albumin: 4 g/dL (ref 3.6–5.1)
Alkaline Phosphatase: 50 U/L (ref 33–130)
BILIRUBIN INDIRECT: 0.4 mg/dL (ref 0.2–1.2)
Bilirubin, Direct: 0.1 mg/dL (ref <=0.2)
TOTAL PROTEIN: 6.4 g/dL (ref 6.1–8.1)
Total Bilirubin: 0.5 mg/dL (ref 0.2–1.2)

## 2015-09-05 NOTE — Patient Instructions (Signed)
Medication Instructions:  Your physician recommends that you continue on your current medications as directed. Please refer to the Current Medication list given to you today.   Labwork: TODAY: BMET, CBC, TSH,  Testing/Procedures: Your physician has recommended that you have a pulmonary function test. Pulmonary Function Tests are a group of tests that measure how well air moves in and out of your lungs.  Follow-Up: Your physician wants you to follow-up in: 6 months with Dr. Radford Pax. You will receive a reminder letter in the mail two months in advance. If you don't receive a letter, please call our office to schedule the follow-up appointment.   Any Other Special Instructions Will Be Listed Below (If Applicable).     If you need a refill on your cardiac medications before your next appointment, please call your pharmacy.

## 2015-09-05 NOTE — Progress Notes (Signed)
Cardiology Office Note    Date:  09/05/2015   ID:  Cynthia, Wright [birth year], MRN [id number]  PCP:  Aretta Nip, MD  Cardiologist:  Fransico Him, MD   Chief Complaint  Patient presents with  . Atrial Fibrillation  . Hypertension  . Congestive Heart Failure    History of Present Illness:  Cynthia Wright is a 82 y.o. 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 She has had several episodes of syncope over the past year and neuro workup was negative. She was referred to EP for loop recorder but patient cancelled first appt with Dr. Lovena Le and then never showed up for second appt due to a family illness. She is doing much better and has not had any syncope. She occasioanally gets dizziness but has some balance issues.She thinks that her dizziness has improved since coming off lasix. She denies any chest pain, SOB, DOE, LE or palpitations.     Past Medical History  Diagnosis Date  . Hypertension   . Diabetes mellitus without complication ([place])   . Hyperlipidemia   . Atrial fibrillation ([place])     s/p DCCV 07/2012  . Chronic anticoagulation   . Dilated cardiomyopathy secondary to tachycardia ([place]) 07/2012    2D echo 10/2012 showed EF 65% with grade II diastolic dysfunction  . Liver cyst   . Elevated cholesterol   . Benign diastolic hypertension   . Encounter for long-term (current) use of other medications   . Chronic diastolic CHF (congestive heart failure) (HCC)     EF now 65% by echo 10/2012 with grade II diasotlid dysfunction  . Chronic systolic congestive heart failure ([place])     resolved    Past Surgical History  Procedure Laterality Date  . Back surgery      herniated disc  . Tee without cardioversion N/A 08/09/2012    Procedure: TRANSESOPHAGEAL ECHOCARDIOGRAM (TEE);  Surgeon: Sueanne Margarita, MD;  Location: [facility] ENDOSCOPY;  Service: Cardiovascular;   Laterality: N/A;  talk to angie in [place].  . Cardioversion N/A 08/09/2012    Procedure: CARDIOVERSION;  Surgeon: Sueanne Margarita, MD;  Location: [facility] ENDOSCOPY;  Service: Cardiovascular;  Laterality: N/A;  . Cardioversion N/A 09/28/2014    Procedure: CARDIOVERSION;  Surgeon: Sueanne Margarita, MD;  Location: [facility] ENDOSCOPY;  Service: Cardiovascular;  Laterality: N/A;    Current Medications: Outpatient Prescriptions Prior to Visit  Medication Sig Dispense Refill  . amiodarone (PACERONE) 200 MG tablet TAKE 1/2 TABLET (100 MG TOTAL) BY MOUTH DAILY. 30 tablet 3  . amLODipine (NORVASC) 2.5 MG tablet TAKE 1 TABLET (2.5 MG TOTAL) BY MOUTH DAILY. 30 tablet 6  . apixaban (ELIQUIS) 2.5 MG TABS tablet Take 1 tablet (2.5 mg total) by mouth 2 (two) times daily. 60 tablet 10  . glucose blood test strip TEST BLOOD GLUCOSE TWICE A DAY AND RECORD    . KLOR-CON M20 20 MEQ tablet Take 2 tablets (40 mEq total) by mouth every other day. 30 tablet 6  . lisinopril (PRINIVIL,ZESTRIL) 40 MG tablet TAKE 1 TABLET (40 MG TOTAL) BY MOUTH DAILY. 30 tablet 11  . Multiple Vitamin (MULTIVITAMIN WITH MINERALS) TABS Take 1 tablet by mouth daily.    [name] DELICA LANCETS 99991111 MISC CHECK BLOOD GLUCOSE TWICE A DAY AND RECORD    . OVER THE COUNTER MEDICATION Take 2 stool softener capsules before bedtime    . OVER  THE COUNTER MEDICATION Place 3 drops into both eyes daily as needed (burning sensation in eyes). Ensure rewetting drops    . rosuvastatin (CRESTOR) 5 MG tablet Take 5 mg by mouth daily.    . sitaGLIPtin (JANUVIA) 100 MG tablet Take 100 mg by mouth daily.    [name] Kitchen zolpidem (AMBIEN) 5 MG tablet Take 2.5-5 mg by mouth at bedtime as needed for sleep.     . furosemide (LASIX) 20 MG tablet Take 1 tablet in the am as needed for swelling, increased weight, shortness of breath (Patient not taking: Reported on 09/05/2015) 30 tablet 3   No facility-administered medications prior to visit.     Allergies:   Codeine and Lortab   Social History    Social History  . Marital Status: Single    Spouse Name: N/A  . Number of Children: N/A  . Years of Education: N/A   Social History Main Topics  . Smoking status: Former Research scientist (life sciences)  . Smokeless tobacco: Never Used  . Alcohol Use: 0.0 oz/week    1-2 Glasses of wine per week  . Drug Use: No  . Sexual Activity: Not Asked   Other Topics Concern  . None   Social History Narrative   Divorced, 3 children   Right handed   Some college   2 cups daily      Family History:  The patient's \  family history includes CAD in her father and mother; Heart disease in her father and mother; Pancreatic cancer in her brother.   ROS:   Please see the history of present illness.    ROS All other systems reviewed and are negative.   PHYSICAL EXAM:   VS:  BP 144/78 mmHg  Pulse 82  Ht 5\' 5"  (1.651 m)  Wt 124 lb 12.8 oz (56.609 kg)  BMI 20.77 kg/m2  SpO2 97%   GEN: Well nourished, well developed, in no acute distress HEENT: normal Neck: no JVD, carotid bruits, or masses Cardiac: RRR; no murmurs, rubs, or gallops,no edema.  Intact distal pulses bilaterally.  Respiratory:  clear to auscultation bilaterally, normal work of breathing GI: soft, nontender, nondistended, + BS MS: no deformity or atrophy Skin: warm and dry, no rash Neuro:  Alert and Oriented x 3, Strength and sensation are intact Psych: euthymic mood, full affect  Wt Readings from Last 3 Encounters:  09/05/15 124 lb 12.8 oz (56.609 kg)  01/01/15 125 lb (56.7 kg)  11/30/14 124 lb 12.8 oz (56.609 kg)      Studies/Labs Reviewed:   EKG:  EKG is not ordered today.   Recent Labs: 11/13/2014: ALT 28; BUN 14; Creatinine, Ser 1.21*; Hemoglobin 13.5; Platelets 194; Potassium 3.8; Sodium 142; TSH 5.838*   Lipid Panel No results found for: CHOL, TRIG, HDL, CHOLHDL, VLDL, LDLCALC, LDLDIRECT  Additional studies/ records that were reviewed today include:  none    ASSESSMENT:    1. PAF (paroxysmal atrial fibrillation) ([place])     2. Essential hypertension, benign   3. Chronic diastolic CHF (congestive heart failure) ([place])   4. Dilated cardiomyopathy secondary to tachycardia ([place])   5. Syncope, unspecified syncope type      PLAN:  In order of problems listed above:  1. PAF - maintaining NSR on Amio.  Continue Amio/Eliquis.  Check TSH/LFTs and PFTs with DLCO.  Check BMET. 2. HTN - BP controlled on current medical therapy.  Continue amlodipine/ACE I 3. Chronic diastolic CHF - appears euvolemic on exam.  Continue Lasix PRN. 4. DCM -  tachycardia induced - resolved and EF 60-65% on echo 2015 5. Syncope with no reoccurrence.     Medication Adjustments/Labs and Tests Ordered: Current medicines are reviewed at length with the patient today.  Concerns regarding medicines are outlined above.  Medication changes, Labs and Tests ordered today are listed in the Patient Instructions below.  There are no Patient Instructions on file for this visit.   Signed, Fransico Him, MD  09/05/2015 9:48 AM    [facility] [place], [place], [place]  [address] Phone: [phone]; Fax: [phone]

## 2015-10-02 ENCOUNTER — Other Ambulatory Visit (HOSPITAL_COMMUNITY): Payer: Self-pay | Admitting: Nurse Practitioner

## 2015-11-04 ENCOUNTER — Other Ambulatory Visit: Payer: Self-pay | Admitting: Cardiology

## 2015-11-05 ENCOUNTER — Other Ambulatory Visit: Payer: Self-pay | Admitting: *Deleted

## 2015-11-05 MED ORDER — KLOR-CON M20 20 MEQ PO TBCR: 40.0000 meq | EXTENDED_RELEASE_TABLET | ORAL | 11 refills | Status: DC

## 2015-11-06 ENCOUNTER — Other Ambulatory Visit: Payer: Self-pay | Admitting: Cardiology

## 2015-11-27 ENCOUNTER — Encounter (INDEPENDENT_AMBULATORY_CARE_PROVIDER_SITE_OTHER): Payer: Medicare Other | Admitting: Internal Medicine

## 2015-11-27 DIAGNOSIS — I480 Paroxysmal atrial fibrillation: Secondary | ICD-10-CM

## 2015-11-27 LAB — PULMONARY FUNCTION TEST
DL/VA % PRED: 88 %
DL/VA: 4.26 ml/min/mmHg/L
DLCO COR: 20.39 ml/min/mmHg
DLCO cor % pred: 84 %
DLCO unc % pred: 83 %
DLCO unc: 20.2 ml/min/mmHg
FEF 25-75 Post: 1.03 L/sec
FEF 25-75 Pre: 1.09 L/sec
FEF2575-%CHANGE-POST: -5 %
FEF2575-%PRED-PRE: 116 %
FEF2575-%Pred-Post: 109 %
FEV1-%CHANGE-POST: 1 %
FEV1-%PRED-PRE: 106 %
FEV1-%Pred-Post: 107 %
FEV1-Post: 1.74 L
FEV1-Pre: 1.72 L
FEV1FVC-%Change-Post: 2 %
FEV1FVC-%Pred-Pre: 97 %
FEV6-%Change-Post: 0 %
FEV6-%PRED-POST: 115 %
FEV6-%Pred-Pre: 116 %
FEV6-POST: 2.37 L
FEV6-Pre: 2.39 L
FEV6FVC-%Change-Post: 0 %
FEV6FVC-%PRED-PRE: 105 %
FEV6FVC-%Pred-Post: 106 %
FVC-%Change-Post: -1 %
FVC-%PRED-POST: 109 %
FVC-%Pred-Pre: 111 %
FVC-Post: 2.4 L
FVC-Pre: 2.44 L
POST FEV6/FVC RATIO: 99 %
PRE FEV6/FVC RATIO: 98 %
Post FEV1/FVC ratio: 72 %
Pre FEV1/FVC ratio: 70 %
RV % PRED: 109 %
RV: 2.8 L
TLC % pred: 101 %
TLC: 5.12 L

## 2016-02-02 ENCOUNTER — Other Ambulatory Visit: Payer: Self-pay | Admitting: Cardiology

## 2016-03-11 ENCOUNTER — Encounter: Payer: Self-pay | Admitting: Cardiology

## 2016-03-11 ENCOUNTER — Ambulatory Visit (INDEPENDENT_AMBULATORY_CARE_PROVIDER_SITE_OTHER): Payer: Medicare Other | Admitting: Cardiology

## 2016-03-11 VITALS — BP 176/72 | HR 59 | Ht 66.0 in | Wt 127.8 lb

## 2016-03-11 DIAGNOSIS — I481 Persistent atrial fibrillation: Secondary | ICD-10-CM

## 2016-03-11 DIAGNOSIS — I428 Other cardiomyopathies: Secondary | ICD-10-CM

## 2016-03-11 DIAGNOSIS — I10 Essential (primary) hypertension: Secondary | ICD-10-CM

## 2016-03-11 DIAGNOSIS — I43 Cardiomyopathy in diseases classified elsewhere: Secondary | ICD-10-CM

## 2016-03-11 DIAGNOSIS — R55 Syncope and collapse: Secondary | ICD-10-CM

## 2016-03-11 DIAGNOSIS — R000 Tachycardia, unspecified: Secondary | ICD-10-CM | POA: Diagnosis not present

## 2016-03-11 DIAGNOSIS — I4819 Other persistent atrial fibrillation: Secondary | ICD-10-CM

## 2016-03-11 DIAGNOSIS — I5032 Chronic diastolic (congestive) heart failure: Secondary | ICD-10-CM

## 2016-03-11 NOTE — Progress Notes (Signed)
Cardiology Office Note    Date:  03/11/2016   ID:  Drystal, Nersesian 10/13/1924, MRN [id number]  PCP:  Aretta Nip, MD  Cardiologist:  Fransico Him, MD   Chief Complaint  Patient presents with  . Atrial Fibrillation  . Hypertension  . Cardiomyopathy    History of Present Illness:  Cynthia Wright is a 82 y.o. 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 She has had several episodes of syncope over the past year and neuro workup was negative. She was referred to EP for loop recorder but patient cancelled first appt with Dr. Lovena Le and then never showed up for second appt due to a family illness. She is doing much better and has not had any syncope. She occasioanally gets dizziness but says that it is more a feeling of being off balance and feeling drunk. She denies any chest pain, SOB, DOE, LE or palpitations.    Past Medical History:  Diagnosis Date  . Atrial fibrillation ([place])    s/p DCCV 07/2012  . Benign diastolic hypertension   . Chronic anticoagulation   . Chronic diastolic CHF (congestive heart failure) (HCC)    EF now 65% by echo 10/2012 with grade II diasotlid dysfunction  . Chronic systolic congestive heart failure ([place])    resolved  . Diabetes mellitus without complication ([place])   . Dilated cardiomyopathy secondary to tachycardia ([place]) 07/2012   2D echo 10/2012 showed EF 65% with grade II diastolic dysfunction  . Elevated cholesterol   . Encounter for long-term (current) use of other medications   . Hyperlipidemia   . Hypertension   . Liver cyst     Past Surgical History:  Procedure Laterality Date  . BACK SURGERY     herniated disc  . CARDIOVERSION N/A 08/09/2012   Procedure: CARDIOVERSION;  Surgeon: Sueanne Margarita, MD;  Location: [place];  Service: Cardiovascular;  Laterality: N/A;  . CARDIOVERSION N/A 09/28/2014   Procedure:  CARDIOVERSION;  Surgeon: Sueanne Margarita, MD;  Location: [facility] ENDOSCOPY;  Service: Cardiovascular;  Laterality: N/A;  . TEE WITHOUT CARDIOVERSION N/A 08/09/2012   Procedure: TRANSESOPHAGEAL ECHOCARDIOGRAM (TEE);  Surgeon: Sueanne Margarita, MD;  [facility] ENDOSCOPY;  Service: Cardiovascular;  Laterality: N/A;  talk to angie in [place].    Current Medications: Outpatient Medications Prior to Visit  Medication Sig Dispense Refill  . amiodarone (PACERONE) 200 MG tablet TAKE 1/2 TABLET (100 MG TOTAL) BY MOUTH DAILY. 30 tablet 3  . amLODipine (NORVASC) 2.5 MG tablet TAKE 1 TABLET (2.5 MG TOTAL) BY MOUTH DAILY. 30 tablet 6  . apixaban (ELIQUIS) 2.5 MG TABS tablet Take 1 tablet (2.5 mg total) by mouth 2 (two) times daily. 60 tablet 10  . lisinopril (PRINIVIL,ZESTRIL) 40 MG tablet TAKE 1 TABLET (40 MG TOTAL) BY MOUTH DAILY. 30 tablet 11  . OVER THE COUNTER MEDICATION Take 2 stool softener capsules before bedtime    . OVER THE COUNTER MEDICATION Place 3 drops into both eyes daily as needed (burning sensation in eyes). Ensure rewetting drops    . rosuvastatin (CRESTOR) 5 MG tablet Take 5 mg by mouth daily.    . sitaGLIPtin (JANUVIA) 100 MG tablet Take 100 mg by mouth daily.    [name] Kitchen glucose blood test strip TEST BLOOD GLUCOSE TWICE A DAY AND RECORD    . KLOR-CON M20 20 MEQ tablet Take 2 tablets (40 mEq total) by  mouth every other day. (Patient not taking: Reported on 03/11/2016) 30 tablet 11  . Multiple Vitamin (MULTIVITAMIN WITH MINERALS) TABS Take 1 tablet by mouth daily.    [name] DELICA LANCETS 99991111 MISC CHECK BLOOD GLUCOSE TWICE A DAY AND RECORD    . zolpidem (AMBIEN) 5 MG tablet Take 2.5-5 mg by mouth at bedtime as needed for sleep.      No facility-administered medications prior to visit.      Allergies:   Codeine and Lortab [hydrocodone-acetaminophen]   Social History   Social History  . Marital status: Single    Spouse name: N/A  . Number of children: N/A  . Years of education: N/A   Social  History Main Topics  . Smoking status: Former Research scientist (life sciences)  . Smokeless tobacco: Never Used  . Alcohol use 0.0 oz/week    1 - 2 Glasses of wine per week  . Drug use: No  . Sexual activity: Not on file   Other Topics Concern  . Not on file   Social History Narrative   Divorced, 3 children   Right handed   Some college   2 cups daily      Family History:  The patient's family history includes CAD in her father and mother; Heart disease in her father and mother; Pancreatic cancer in her brother.   ROS:   Please see the history of present illness.    ROS All other systems reviewed and are negative.  No flowsheet data found.     PHYSICAL EXAM:   VS:  BP (!) 176/72 (BP Location: Right Arm)   Pulse (!) 59   Ht 5\' 6"  (1.676 m)   Wt 127 lb 12.8 oz (58 kg)   BMI 20.63 kg/m    GEN: Well nourished, well developed, in no acute distress  HEENT: normal  Neck: no JVD, carotid bruits, or masses Cardiac: RRR; no murmurs, rubs, or gallops,no edema.  Intact distal pulses bilaterally.  Respiratory:  clear to auscultation bilaterally, normal work of breathing GI: soft, nontender, nondistended, + BS MS: no deformity or atrophy  Skin: warm and dry, no rash Neuro:  Alert and Oriented x 3, Strength and sensation are intact Psych: euthymic mood, full affect  Wt Readings from Last 3 Encounters:  03/11/16 127 lb 12.8 oz (58 kg)  09/05/15 124 lb 12.8 oz (56.6 kg)  01/01/15 125 lb (56.7 kg)    Orthostatic VS for the past 24 hrs (Last 3 readings):  BP- Lying Pulse- Lying BP- Sitting Pulse- Sitting BP- Standing at 0 minutes Pulse- Standing at 0 minutes BP- Standing at 3 minutes Pulse- Standing at 3 minutes  03/11/16 1019 138/68 59 132/70 61 142/68 63 142/70 60     Studies/Labs Reviewed:   EKG:  EKG is ordered today.  The ekg ordered today demonstrates Sinus bradycardia at 59bpm with septal infarct and no ST changes  Recent Labs: 09/05/2015: ALT 17; BUN 16; Creat 0.99; Hemoglobin 12.6; Platelets  192; Potassium 4.1; Sodium 142; TSH 3.51   Lipid Panel No results found for: CHOL, TRIG, HDL, CHOLHDL, VLDL, LDLCALC, LDLDIRECT  Additional studies/ records that were reviewed today include:  none    ASSESSMENT:    1. Persistent atrial fibrillation ([place])   2. Essential hypertension, benign   3. Chronic diastolic CHF (congestive heart failure) ([place])   4. Dilated cardiomyopathy secondary to tachycardia ([place])   5. Syncope, unspecified syncope type      PLAN:  In order of problems listed above:  1. Persistent atrial fibrillation s/p DCCV in the past - maintaining sinus bradycardia on Amio.  Continue Apixaban. 2. HTN - BP poorly controlled on current meds.  I will check a 24 hour BP monitor to assess what BP is throughout the day.  Continue ACE I/amlodipine. 3. Chronic diastolic CHF - she appears euvolemic on exam and weight is stable.  Continue ACE I.  4. DCM secondary to tachycardia - EF has normalized by last echo. 5. Syncope ? Etiology - she has not had any further episodes. She still has dizziness but it seems more related to balance and not orthostasis.  Her orthostatic Bps are normal today.     Medication Adjustments/Labs and Tests Ordered: Current medicines are reviewed at length with the patient today.  Concerns regarding medicines are outlined above.  Medication changes, Labs and Tests ordered today are listed in the Patient Instructions below.  There are no Patient Instructions on file for this visit.   Signed, Fransico Him, MD  03/11/2016 9:51 AM    [address], [place], [place]  [address] Phone: [phone]; Fax: [phone]

## 2016-03-11 NOTE — Patient Instructions (Signed)
Medication Instructions:  Your physician recommends that you continue on your current medications as directed. Please refer to the Current Medication list given to you today.   Labwork: None  Testing/Procedures: Dr. Radford Pax recommends you have a 24 hour blood pressure monitor.  Follow-Up: Your physician wants you to follow-up in: 6 months with Dr. Radford Pax. You will receive a reminder letter in the mail two months in advance. If you don't receive a letter, please call our office to schedule the follow-up appointment.   Any Other Special Instructions Will Be Listed Below (If Applicable).     If you need a refill on your cardiac medications before your next appointment, please call your pharmacy.

## 2016-04-21 ENCOUNTER — Other Ambulatory Visit: Payer: Self-pay | Admitting: Cardiology

## 2016-04-21 NOTE — Telephone Encounter (Signed)
Pt saw Dr Radford Pax 03/11/16, labs done 09/05/15 Creat 0.99, Age 83, weight 58kg, pt is on Eliquis 2.5mg  BID which per specified criteria is the appropriate dosage.

## 2016-04-22 ENCOUNTER — Other Ambulatory Visit: Payer: Self-pay | Admitting: *Deleted

## 2016-04-22 MED ORDER — LISINOPRIL 40 MG PO TABS: ORAL_TABLET | ORAL | 3 refills | Status: DC

## 2016-04-22 MED ORDER — AMLODIPINE BESYLATE 2.5 MG PO TABS: ORAL_TABLET | ORAL | 3 refills | Status: DC

## 2016-04-24 ENCOUNTER — Encounter: Payer: Self-pay | Admitting: *Deleted

## 2016-04-24 ENCOUNTER — Ambulatory Visit (INDEPENDENT_AMBULATORY_CARE_PROVIDER_SITE_OTHER): Payer: Medicare Other

## 2016-04-24 DIAGNOSIS — I10 Essential (primary) hypertension: Secondary | ICD-10-CM

## 2016-04-24 NOTE — Progress Notes (Signed)
Patient ID: Cynthia Wright, female   DOB: [birth year], 83 y.o.   MRN: [id number] 24 hour ambulatory blood pressure monitor applied using small adult cuff.

## 2016-05-02 ENCOUNTER — Telehealth: Payer: Self-pay | Admitting: Cardiology

## 2016-05-02 NOTE — Telephone Encounter (Signed)
Spoke with pt and advised that BP monitor results were not in yet. Pt became very upset and did not understand why we didn't have the results back yet.  Advised pt that it was just placed last Thurs and it does have a slight turnover time in order for information to be downloaded.  Pt said I was wrong and that monitor was placed 2 weeks ago.  Advised we would call as soon as results were available.  Pt continued to talk over me and is upset as to why we don't have results already.  Apologized for delay and explained again about delay and that we would call once they are in.  Pt verbalized understanding.

## 2016-05-02 NOTE — Telephone Encounter (Signed)
New message ° ° ° ° ° °Calling to get test results °

## 2016-05-03 ENCOUNTER — Other Ambulatory Visit: Payer: Self-pay | Admitting: Cardiology

## 2016-05-05 ENCOUNTER — Other Ambulatory Visit (HOSPITAL_COMMUNITY): Payer: Self-pay | Admitting: Nurse Practitioner

## 2016-05-05 MED ORDER — AMLODIPINE BESYLATE 5 MG PO TABS: 5.0000 mg | ORAL_TABLET | Freq: Every day | ORAL | 11 refills | Status: DC

## 2016-05-05 NOTE — Telephone Encounter (Signed)
Informed patient of results and verbal understanding expressed.   BP average = 154/73. Per Dr. Radford Pax, instructed patient to INCREASE AMLODIPINE to 5 mg daily. HTN Clinic OV scheduled next Friday. Patient agrees with treatment plan.

## 2016-05-06 ENCOUNTER — Ambulatory Visit: Payer: Medicare Other

## 2016-05-06 NOTE — Telephone Encounter (Signed)
Please advise on refill request as patient reported not taking this medication at last office visit and per the rx request, patient has not had this filled since 02/03/16 for a quantity of thirty. Thanks, MI

## 2016-05-16 ENCOUNTER — Telehealth: Payer: Self-pay | Admitting: Cardiology

## 2016-05-16 ENCOUNTER — Ambulatory Visit: Payer: Medicare Other

## 2016-05-16 NOTE — Telephone Encounter (Signed)
Patient is calling to receive more instructions about amLODipine dosage. Patient is a little confused and also states that she was not aware of appointment scheduled today 05-16-16 @10am . Please call, thanks.

## 2016-05-16 NOTE — Progress Notes (Deleted)
Patient ID: VEGA BOZARD                 DOB: [birth year]                      MRN: [id number]     HPI: Cynthia Wright is a 83 y.o. female patient of Dr. Radford Pax who presents today for hypertension evaluation. 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  Who recently had an ambulatory blood pressure monitor with avg BP 154/73. Her amlodipine was increased to 5mg  daily.   Current HTN meds:  Lisinopril 40mg  daily Amlodipine 5mg  daily Furosemide 20mg  PRN   Previously tried:  Metoprolol HCTZ Carvedilol diltiazem  BP goal:   Family History:   Social History:   Diet:   Exercise:   Home BP readings:   Wt Readings from Last 3 Encounters:  03/11/16 127 lb 12.8 oz (58 kg)  09/05/15 124 lb 12.8 oz (56.6 kg)  01/01/15 125 lb (56.7 kg)   BP Readings from Last 3 Encounters:  03/11/16 (!) 176/72  09/05/15 (!) 144/78  01/01/15 130/68   Pulse Readings from Last 3 Encounters:  03/11/16 (!) 59  09/05/15 82  01/01/15 (!) 57    Renal function: CrCl cannot be calculated (Patient's most recent lab result is older than the maximum 21 days allowed.).  Past Medical History:  Diagnosis Date  . Atrial fibrillation ([place])    s/p DCCV 07/2012  . Benign diastolic hypertension   . Chronic anticoagulation   . Chronic diastolic CHF (congestive heart failure) (HCC)    EF now 65% by echo 10/2012 with grade II diasotlid dysfunction  . Diabetes mellitus without complication ([place])   . Dilated cardiomyopathy secondary to tachycardia ([place]) 07/2012   2D echo 10/2012 showed EF 65% with grade II diastolic dysfunction  . Elevated cholesterol   . Encounter for long-term (current) use of other medications   . Hyperlipidemia   . Hypertension   . Liver cyst     Current Outpatient Prescriptions on File Prior to Visit  Medication Sig Dispense Refill  . amiodarone (PACERONE) 200 MG tablet TAKE 1/2 TABLET (100 MG TOTAL) BY MOUTH DAILY. 30  tablet 3  . amLODipine (NORVASC) 5 MG tablet Take 1 tablet (5 mg total) by mouth daily. 30 tablet 11  . ELIQUIS 2.5 MG TABS tablet TAKE 1 TABLET (2.5 MG TOTAL) BY MOUTH 2 (TWO) TIMES DAILY. 60 tablet 5  . furosemide (LASIX) 20 MG tablet Take 20 mg by mouth daily as needed for fluid or edema.    [name] Kitchen KLOR-CON M20 20 MEQ tablet Take 40 mEq by mouth every other day.    . lisinopril (PRINIVIL,ZESTRIL) 40 MG tablet TAKE 1 TABLET (40 MG TOTAL) BY MOUTH DAILY. 90 tablet 3  . OVER THE COUNTER MEDICATION Take 2 stool softener capsules before bedtime    . OVER THE COUNTER MEDICATION Place 3 drops into both eyes daily as needed (burning sensation in eyes). Ensure rewetting drops    . rosuvastatin (CRESTOR) 5 MG tablet Take 5 mg by mouth daily.    . sitaGLIPtin (JANUVIA) 100 MG tablet Take 100 mg by mouth daily.     No current facility-administered medications on file prior to visit.     Allergies  Allergen Reactions  . Codeine Other (See Comments)    "Just get sick"  . Lortab [Hydrocodone-Acetaminophen] Other (See Comments)    "Just get sick"  There were no vitals taken for this visit.   Assessment/Plan: Hypertension:    Thank you, Lelan Pons. Patterson Hammersmith, [facility]  05/16/2016 7:42 AM

## 2016-05-20 NOTE — Telephone Encounter (Signed)
Apologized to patient for delayed response as message was sent when RN was out of the office. Patient states she is unable to talk as she is dealing with the emotional aftermath of her daughter's sudden death yesterday. Gave condolences and informed patient she would be checked on later this week. She was grateful for call.

## 2016-05-23 ENCOUNTER — Other Ambulatory Visit: Payer: Self-pay | Admitting: *Deleted

## 2016-05-23 MED ORDER — POTASSIUM CHLORIDE CRYS ER 20 MEQ PO TBCR: 40.0000 meq | EXTENDED_RELEASE_TABLET | ORAL | 9 refills | Status: AC

## 2016-05-23 NOTE — Telephone Encounter (Signed)
Called to follow-up with patient. She is grateful for call, but not ready to talk about medications yet.  She requests a call Monday.

## 2016-05-29 NOTE — Telephone Encounter (Signed)
Confirmed with patient she is to take Amlodipine 5 mg daily. She was grateful for call.

## 2016-06-25 ENCOUNTER — Other Ambulatory Visit: Payer: Self-pay | Admitting: *Deleted

## 2016-06-25 MED ORDER — AMLODIPINE BESYLATE 5 MG PO TABS: 5.0000 mg | ORAL_TABLET | Freq: Every day | ORAL | 3 refills | Status: DC

## 2016-07-11 ENCOUNTER — Observation Stay (HOSPITAL_COMMUNITY)
Admission: EM | Admit: 2016-07-11 | Discharge: 2016-07-14 | Disposition: A | Discharge status: Skilled Nursing Facility | Payer: Medicare Other | Attending: Internal Medicine | Admitting: Internal Medicine

## 2016-07-11 ENCOUNTER — Emergency Department (HOSPITAL_COMMUNITY): Payer: Medicare Other

## 2016-07-11 ENCOUNTER — Encounter (HOSPITAL_COMMUNITY): Payer: Self-pay | Admitting: Nurse Practitioner

## 2016-07-11 DIAGNOSIS — E1169 Type 2 diabetes mellitus with other specified complication: Secondary | ICD-10-CM | POA: Diagnosis not present

## 2016-07-11 DIAGNOSIS — Z79899 Other long term (current) drug therapy: Secondary | ICD-10-CM | POA: Diagnosis not present

## 2016-07-11 DIAGNOSIS — I340 Nonrheumatic mitral (valve) insufficiency: Secondary | ICD-10-CM | POA: Diagnosis not present

## 2016-07-11 DIAGNOSIS — Z8679 Personal history of other diseases of the circulatory system: Secondary | ICD-10-CM | POA: Diagnosis not present

## 2016-07-11 DIAGNOSIS — E119 Type 2 diabetes mellitus without complications: Secondary | ICD-10-CM | POA: Insufficient documentation

## 2016-07-11 DIAGNOSIS — D72829 Elevated white blood cell count, unspecified: Secondary | ICD-10-CM | POA: Insufficient documentation

## 2016-07-11 DIAGNOSIS — S32599A Other specified fracture of unspecified pubis, initial encounter for closed fracture: Secondary | ICD-10-CM | POA: Insufficient documentation

## 2016-07-11 DIAGNOSIS — Z7901 Long term (current) use of anticoagulants: Secondary | ICD-10-CM | POA: Insufficient documentation

## 2016-07-11 DIAGNOSIS — S7001XA Contusion of right hip, initial encounter: Secondary | ICD-10-CM | POA: Diagnosis not present

## 2016-07-11 DIAGNOSIS — S7291XA Unspecified fracture of right femur, initial encounter for closed fracture: Secondary | ICD-10-CM | POA: Diagnosis not present

## 2016-07-11 DIAGNOSIS — S32401A Unspecified fracture of right acetabulum, initial encounter for closed fracture: Secondary | ICD-10-CM | POA: Diagnosis not present

## 2016-07-11 DIAGNOSIS — S32431A Displaced fracture of anterior column [iliopubic] of right acetabulum, initial encounter for closed fracture: Secondary | ICD-10-CM

## 2016-07-11 DIAGNOSIS — I110 Hypertensive heart disease with heart failure: Secondary | ICD-10-CM | POA: Insufficient documentation

## 2016-07-11 DIAGNOSIS — S72001A Fracture of unspecified part of neck of right femur, initial encounter for closed fracture: Secondary | ICD-10-CM | POA: Insufficient documentation

## 2016-07-11 DIAGNOSIS — W19XXXA Unspecified fall, initial encounter: Secondary | ICD-10-CM | POA: Diagnosis not present

## 2016-07-11 DIAGNOSIS — I420 Dilated cardiomyopathy: Secondary | ICD-10-CM | POA: Diagnosis not present

## 2016-07-11 DIAGNOSIS — Y9301 Activity, walking, marching and hiking: Secondary | ICD-10-CM | POA: Diagnosis not present

## 2016-07-11 DIAGNOSIS — Y9289 Other specified places as the place of occurrence of the external cause: Secondary | ICD-10-CM | POA: Diagnosis not present

## 2016-07-11 DIAGNOSIS — I482 Chronic atrial fibrillation: Secondary | ICD-10-CM | POA: Diagnosis not present

## 2016-07-11 DIAGNOSIS — I5032 Chronic diastolic (congestive) heart failure: Secondary | ICD-10-CM

## 2016-07-11 DIAGNOSIS — S32409A Unspecified fracture of unspecified acetabulum, initial encounter for closed fracture: Secondary | ICD-10-CM | POA: Diagnosis present

## 2016-07-11 DIAGNOSIS — E7800 Pure hypercholesterolemia, unspecified: Secondary | ICD-10-CM | POA: Diagnosis not present

## 2016-07-11 DIAGNOSIS — I10 Essential (primary) hypertension: Secondary | ICD-10-CM

## 2016-07-11 DIAGNOSIS — Z1629 Resistance to other single specified antibiotic: Secondary | ICD-10-CM | POA: Insufficient documentation

## 2016-07-11 DIAGNOSIS — K7689 Other specified diseases of liver: Secondary | ICD-10-CM | POA: Diagnosis not present

## 2016-07-11 DIAGNOSIS — Z794 Long term (current) use of insulin: Secondary | ICD-10-CM | POA: Insufficient documentation

## 2016-07-11 DIAGNOSIS — S3210XA Unspecified fracture of sacrum, initial encounter for closed fracture: Secondary | ICD-10-CM | POA: Insufficient documentation

## 2016-07-11 DIAGNOSIS — D7589 Other specified diseases of blood and blood-forming organs: Secondary | ICD-10-CM | POA: Diagnosis not present

## 2016-07-11 DIAGNOSIS — Z87891 Personal history of nicotine dependence: Secondary | ICD-10-CM | POA: Insufficient documentation

## 2016-07-11 DIAGNOSIS — E785 Hyperlipidemia, unspecified: Secondary | ICD-10-CM | POA: Diagnosis not present

## 2016-07-11 DIAGNOSIS — I481 Persistent atrial fibrillation: Secondary | ICD-10-CM | POA: Diagnosis not present

## 2016-07-11 DIAGNOSIS — Y9389 Activity, other specified: Secondary | ICD-10-CM | POA: Diagnosis not present

## 2016-07-11 DIAGNOSIS — B9620 Unspecified Escherichia coli [E. coli] as the cause of diseases classified elsewhere: Secondary | ICD-10-CM | POA: Diagnosis not present

## 2016-07-11 DIAGNOSIS — W010XXA Fall on same level from slipping, tripping and stumbling without subsequent striking against object, initial encounter: Secondary | ICD-10-CM | POA: Insufficient documentation

## 2016-07-11 DIAGNOSIS — S32411A Displaced fracture of anterior wall of right acetabulum, initial encounter for closed fracture: Principal | ICD-10-CM | POA: Insufficient documentation

## 2016-07-11 DIAGNOSIS — Z8249 Family history of ischemic heart disease and other diseases of the circulatory system: Secondary | ICD-10-CM | POA: Insufficient documentation

## 2016-07-11 DIAGNOSIS — R6889 Other general symptoms and signs: Secondary | ICD-10-CM | POA: Diagnosis not present

## 2016-07-11 DIAGNOSIS — S32591A Other specified fracture of right pubis, initial encounter for closed fracture: Secondary | ICD-10-CM | POA: Insufficient documentation

## 2016-07-11 DIAGNOSIS — Z800 Family history of malignant neoplasm of digestive organs: Secondary | ICD-10-CM | POA: Insufficient documentation

## 2016-07-11 DIAGNOSIS — N390 Urinary tract infection, site not specified: Secondary | ICD-10-CM | POA: Insufficient documentation

## 2016-07-11 DIAGNOSIS — M25551 Pain in right hip: Secondary | ICD-10-CM | POA: Diagnosis not present

## 2016-07-11 LAB — TYPE AND SCREEN
ABO/RH(D): O POS
Antibody Screen: NEGATIVE

## 2016-07-11 LAB — CBC
HCT: 38 % (ref 36.0–46.0)
HEMOGLOBIN: 12.2 g/dL (ref 12.0–15.0)
MCH: 33.2 pg (ref 26.0–34.0)
MCHC: 32.1 g/dL (ref 30.0–36.0)
MCV: 103.5 fL — AB (ref 78.0–100.0)
PLATELETS: 166 10*3/uL (ref 150–400)
RBC: 3.67 MIL/uL — AB (ref 3.87–5.11)
RDW: 13.6 % (ref 11.5–15.5)
WBC: 13.9 10*3/uL — ABNORMAL HIGH (ref 4.0–10.5)

## 2016-07-11 LAB — COMPREHENSIVE METABOLIC PANEL
ALBUMIN: 3.9 g/dL (ref 3.5–5.0)
ALT: 40 U/L (ref 14–54)
ANION GAP: 8 (ref 5–15)
AST: 35 U/L (ref 15–41)
Alkaline Phosphatase: 79 U/L (ref 38–126)
BUN: 19 mg/dL (ref 6–20)
CHLORIDE: 107 mmol/L (ref 101–111)
CO2: 26 mmol/L (ref 22–32)
Calcium: 8.9 mg/dL (ref 8.9–10.3)
Creatinine, Ser: 0.9 mg/dL (ref 0.44–1.00)
GFR calc Af Amer: >60 mL/min (ref >60)
GFR calc non Af Amer: 55 mL/min — ABNORMAL LOW (ref >60)
GLUCOSE: 171 mg/dL — AB (ref 65–99)
POTASSIUM: 3.8 mmol/L (ref 3.5–5.1)
SODIUM: 141 mmol/L (ref 135–145)
TOTAL PROTEIN: 6.9 g/dL (ref 6.5–8.1)
Total Bilirubin: 0.6 mg/dL (ref 0.3–1.2)

## 2016-07-11 MED ORDER — POLYETHYL GLYCOL-PROPYL GLYCOL 0.4-0.3 % OP SOLN: 1.0000 [drp] | Freq: Four times a day (QID) | OPHTHALMIC | Status: DC | PRN

## 2016-07-11 MED ORDER — TRAMADOL HCL 50 MG PO TABS
50.0000 mg | ORAL_TABLET | Freq: Four times a day (QID) | ORAL | Status: DC | PRN
  Administered 2016-07-11 – 2016-07-12 (×2): 50 mg via ORAL
  Filled 2016-07-11 (×2): qty 1

## 2016-07-11 MED ORDER — POTASSIUM CHLORIDE CRYS ER 20 MEQ PO TBCR
40.0000 meq | EXTENDED_RELEASE_TABLET | ORAL | Status: DC
  Administered 2016-07-12 – 2016-07-13 (×2): 40 meq via ORAL
  Filled 2016-07-11 (×2): qty 2

## 2016-07-11 MED ORDER — ROSUVASTATIN CALCIUM 5 MG PO TABS
5.0000 mg | ORAL_TABLET | Freq: Every day | ORAL | Status: DC
  Administered 2016-07-11 – 2016-07-13 (×3): 5 mg via ORAL
  Filled 2016-07-11 (×3): qty 1

## 2016-07-11 MED ORDER — DOCUSATE SODIUM 100 MG PO CAPS
200.0000 mg | ORAL_CAPSULE | Freq: Every day | ORAL | Status: DC
  Administered 2016-07-11 – 2016-07-13 (×3): 200 mg via ORAL
  Filled 2016-07-11 (×3): qty 2

## 2016-07-11 MED ORDER — POLYVINYL ALCOHOL 1.4 % OP SOLN
1.0000 [drp] | Freq: Four times a day (QID) | OPHTHALMIC | Status: DC | PRN
  Filled 2016-07-11: qty 15

## 2016-07-11 MED ORDER — AMIODARONE HCL 100 MG PO TABS
100.0000 mg | ORAL_TABLET | Freq: Every day | ORAL | Status: DC
  Administered 2016-07-12 – 2016-07-13 (×2): 100 mg via ORAL
  Filled 2016-07-11 (×3): qty 1

## 2016-07-11 MED ORDER — APIXABAN 2.5 MG PO TABS
2.5000 mg | ORAL_TABLET | Freq: Two times a day (BID) | ORAL | Status: DC
  Administered 2016-07-11 – 2016-07-13 (×5): 2.5 mg via ORAL
  Filled 2016-07-11 (×5): qty 1

## 2016-07-11 MED ORDER — ONDANSETRON HCL 4 MG/2ML IJ SOLN: 4.0000 mg | Freq: Four times a day (QID) | INTRAMUSCULAR | Status: DC | PRN

## 2016-07-11 MED ORDER — AMLODIPINE BESYLATE 5 MG PO TABS
5.0000 mg | ORAL_TABLET | Freq: Every day | ORAL | Status: DC
  Administered 2016-07-12 – 2016-07-13 (×2): 5 mg via ORAL
  Filled 2016-07-11 (×2): qty 1

## 2016-07-11 MED ORDER — FUROSEMIDE 20 MG PO TABS: 20.0000 mg | ORAL_TABLET | Freq: Every day | ORAL | Status: DC | PRN

## 2016-07-11 MED ORDER — FENTANYL CITRATE (PF) 100 MCG/2ML IJ SOLN
25.0000 ug | INTRAMUSCULAR | Status: DC | PRN
  Administered 2016-07-12: 25 ug via INTRAVENOUS
  Filled 2016-07-11: qty 2

## 2016-07-11 MED ORDER — LISINOPRIL 20 MG PO TABS
40.0000 mg | ORAL_TABLET | Freq: Every day | ORAL | Status: DC
  Administered 2016-07-12 – 2016-07-13 (×2): 40 mg via ORAL
  Filled 2016-07-11 (×2): qty 2

## 2016-07-11 MED ORDER — ACETAMINOPHEN 325 MG PO TABS
650.0000 mg | ORAL_TABLET | Freq: Once | ORAL | Status: AC
  Administered 2016-07-11: 650 mg via ORAL
  Filled 2016-07-11: qty 2

## 2016-07-11 MED ORDER — INSULIN ASPART 100 UNIT/ML ~~LOC~~ SOLN
0.0000 [IU] | Freq: Three times a day (TID) | SUBCUTANEOUS | Status: DC
  Administered 2016-07-12: 1 [IU] via SUBCUTANEOUS
  Administered 2016-07-12: 2 [IU] via SUBCUTANEOUS
  Administered 2016-07-13 (×2): 1 [IU] via SUBCUTANEOUS
  Administered 2016-07-13: 2 [IU] via SUBCUTANEOUS

## 2016-07-11 NOTE — ED Provider Notes (Signed)
[facility] DEPT Provider Note   CSN: [id number] Arrival date & time: 07/11/16  1608     History   Chief Complaint Chief Complaint  Patient presents with  . Hip Pain    Right lateral  . Fall    HPI Cynthia Wright is a 83 y.o. female with PMHx of Afib, HTN, CHF, DM, brought in by EMS presenting today with complaints of mechanical fall landing on right hip today at 3 pm. She reports sore, dull, ache, constant, unchanged, 7/10. She reports no other associated symptoms. She denies head injury, LOC, or any other injury. She denies fever, chills, chest pain, shortness of breath, visual changes. She states she has not been able to stand and ambulate since injury. She reports not trying anything for her pain. She denies previous injury to right hip. She reports taking Eliquis.   The history is provided by the patient. No language interpreter was used.    Past Medical History:  Diagnosis Date  . Atrial fibrillation ([place])    s/p DCCV 07/2012  . Benign diastolic hypertension   . Chronic anticoagulation   . Chronic diastolic CHF (congestive heart failure) (HCC)    EF now 65% by echo 10/2012 with grade II diasotlid dysfunction  . Diabetes mellitus without complication ([place])   . Dilated cardiomyopathy secondary to tachycardia ([place]) 07/2012   2D echo 10/2012 showed EF 65% with grade II diastolic dysfunction  . Elevated cholesterol   . Encounter for long-term (current) use of other medications   . Hyperlipidemia   . Hypertension   . Liver cyst     Patient Active Problem List   Diagnosis Date Noted  . Dilated cardiomyopathy secondary to tachycardia ([place]) 12/14/2013  . Syncope 06/14/2013  . Bradycardia 05/11/2013  . Fatigue 05/11/2013  . Chronic diastolic CHF (congestive heart failure) ([place]) 01/06/2013  . Mitral regurgitation 08/06/2012  . Persistent atrial fibrillation ([place]) 08/02/2012  . Essential hypertension, benign 08/02/2012  . Pure hypercholesterolemia 08/02/2012  . Liver  cyst 08/02/2012    Past Surgical History:  Procedure Laterality Date  . BACK SURGERY     herniated disc  . CARDIOVERSION N/A 08/09/2012   Procedure: CARDIOVERSION;  Surgeon: Sueanne Margarita, MD;  Location: [facility];  Service: Cardiovascular;  Laterality: N/A;  . CARDIOVERSION N/A 09/28/2014   Procedure: CARDIOVERSION;  Surgeon: Sueanne Margarita, MD;  Location: [facility] ENDOSCOPY;  Service: Cardiovascular;  Laterality: N/A;  . TEE WITHOUT CARDIOVERSION N/A 08/09/2012   Procedure: TRANSESOPHAGEAL ECHOCARDIOGRAM (TEE);  Surgeon: Sueanne Margarita, MD;  Location: [facility] ENDOSCOPY;  Service: Cardiovascular;  Laterality: N/A;  talk to angie in [place].    OB History    No data available       Home Medications    Prior to Admission medications   Medication Sig Start Date End Date Taking? Authorizing Provider  amiodarone (PACERONE) 200 MG tablet TAKE 1/2 TABLET (100 MG TOTAL) BY MOUTH DAILY. 05/05/16  Yes Sueanne Margarita, MD  amLODipine (NORVASC) 5 MG tablet Take 1 tablet (5 mg total) by mouth daily. 06/25/16 06/20/17 Yes Sueanne Margarita, MD  docusate sodium (COLACE) 100 MG capsule Take 200 mg by mouth at bedtime.   Yes Historical Provider, MD  ELIQUIS 2.5 MG TABS tablet TAKE 1 TABLET (2.5 MG TOTAL) BY MOUTH 2 (TWO) TIMES DAILY. 04/21/16  Yes Sueanne Margarita, MD  furosemide (LASIX) 20 MG tablet Take 20 mg by mouth daily as needed for edema.    Yes Historical Provider, MD  lisinopril (PRINIVIL,ZESTRIL) 40 MG tablet TAKE 1 TABLET (40 MG TOTAL) BY MOUTH DAILY. Patient taking differently: Take 40 mg by mouth daily.  04/22/16  Yes Sueanne Margarita, MD  Polyethyl Glycol-Propyl Glycol (SYSTANE) 0.4-0.3 % SOLN Place 1-2 drops into both eyes 4 (four) times daily as needed (for dry eyes).   Yes Historical Provider, MD  potassium chloride SA (KLOR-CON M20) 20 MEQ tablet Take 2 tablets (40 mEq total) by mouth every other day. 05/23/16  Yes Sueanne Margarita, MD  rosuvastatin (CRESTOR) 5 MG tablet Take 5 mg by mouth at bedtime.    Yes  Historical Provider, MD  sitaGLIPtin (JANUVIA) 100 MG tablet Take 100 mg by mouth daily.   Yes Historical Provider, MD    Family History Family History  Problem Relation Age of Onset  . Heart disease Mother   . CAD Mother   . Heart disease Father   . CAD Father   . Pancreatic cancer Brother     Social History Social History  Substance Use Topics  . Smoking status: Former Research scientist (life sciences)  . Smokeless tobacco: Never Used  . Alcohol use 0.0 oz/week    1 - 2 Glasses of wine per week     Allergies   Codeine and Lortab [hydrocodone-acetaminophen]   Review of Systems Review of Systems  Constitutional: Negative for chills and fever.  Musculoskeletal: Positive for arthralgias.  All other systems reviewed and are negative.    Physical Exam Updated Vital Signs BP (!) 166/67 (BP Location: Right Arm)   Pulse (!) 57   Temp 97.9 F (36.6 C) (Oral)   Resp 14   Ht 5\' 5"  (1.651 m)   Wt 59 kg   SpO2 97%   BMI 21.63 kg/m   Physical Exam  Constitutional: She is oriented to person, place, and time. She appears well-developed and well-nourished.  Well appearing  HENT:  Head: Normocephalic and atraumatic.  Nose: Nose normal.  Mouth/Throat: Oropharynx is clear and moist.  no wounds, redness, swelling, tenderness to head or scalp.  Eyes: Conjunctivae and EOM are normal.  Neck: Normal range of motion.  Cardiovascular: Normal rate, normal heart sounds and intact distal pulses.   Pulmonary/Chest: Effort normal and breath sounds normal. No respiratory distress. She has no wheezes. She has no rales.  Normal work of breathing. No respiratory distress noted.   Abdominal: Soft.  Musculoskeletal: Normal range of motion.  Tenderness over right quadricep. Minimal to no tenderness over the right greater trochanter, right inguinal area. Good range of motion of right hip. No obvious deformity or shortening.  No tenderness to midline cervical, thoracic, lumbar spine.  Neurological: She is alert and  oriented to person, place, and time.  Sensation intact to BLE. Muscle strength 5/5 BLE , good strength against resistance to right hip flexion, extension, abduction, abduction.  Skin: Skin is warm.  No redness, swelling, bruising noted to the right hip.  Psychiatric: She has a normal mood and affect. Her behavior is normal.  Nursing note and vitals reviewed.    ED Treatments / Results  Labs (all labs ordered are listed, but only abnormal results are displayed) Labs Reviewed  CBC - Abnormal; Notable for the following:       Result Value   WBC 13.9 (*)    RBC 3.67 (*)    MCV 103.5 (*)    All other components within normal limits  COMPREHENSIVE METABOLIC PANEL - Abnormal; Notable for the following:    Glucose, Bld 171 (*)  GFR calc non Af Amer 55 (*)    All other components within normal limits  TYPE AND SCREEN    EKG  EKG Interpretation None       Radiology Ct Hip Right Wo Contrast  Result Date: 07/11/2016 CLINICAL DATA:  Fall landing on the right hip. Right hip pain. Acetabular fracture on conventional radiographs. EXAM: CT OF THE RIGHT HIP WITHOUT CONTRAST TECHNIQUE: Multidetector CT imaging of the right hip was performed according to the standard protocol. Multiplanar CT image reconstructions were also generated. COMPARISON:  07/11/2016 FINDINGS: Bones/Joint/Cartilage Anterior column acetabular fracture variant noted with involvement of the anterior inferior acetabular wall, and also with a fracture of the inferior pubic ramus. This fracture does not extend up into the iliac bone is best I can see. Today's CT imaging was performed only of the right hip cannot of the entire bony pelvis which precludes the ability to assess for other pelvic fractures. There is some indistinctness of the soft tissues anterior to the right piriformis muscle which could indicate mild hematoma or conceivably an occult right sacral fracture. Ligaments Suboptimally assessed by CT. Muscles and Tendons  Unremarkable Soft tissues Some mild hematoma tracking in the right inguinal region. IMPRESSION: 1. Anterior column acetabular fracture variant, with involvement of the inferior pubic ramus and the anterior inferior acetabular wall but not extending up into the iliac bone. 2. Mild hematoma overlying the right inguinal region. There is some subtle indistinctness of tissue planes approaching the sciatic notch, which could indicate an otherwise occult sacral fracture. Today's CT exam only included the right hip and not the entire bony pelvis. Electronically Signed   By: Van Clines M.D.   On: 07/11/2016 19:49   Dg Hip Unilat W Or Wo Pelvis 2-3 Views Right  Result Date: 07/11/2016 CLINICAL DATA:  Fall landing on the right hip.  Right hip pain. EXAM: DG HIP (WITH OR WITHOUT PELVIS) 2-3V RIGHT COMPARISON:  None. FINDINGS: There is an acute acetabular fracture on the right with displaced disruption of the iliopubic line. Reduced intervertebral disc height at L5-S1. Bony demineralization. Questionable inferior pubic ramus fracture adjacent to the pubic body. IMPRESSION: 1. Acute right acetabular fracture. Possible right inferior pubic ramus fracture. Consider CT of the bony pelvis for further characterization. Electronically Signed   By: Van Clines M.D.   On: 07/11/2016 18:12    Procedures Procedures (including critical care time)  Medications Ordered in ED Medications  acetaminophen (TYLENOL) tablet 650 mg (650 mg Oral Given 07/11/16 1941)     Initial Impression / Assessment and Plan / ED Course  I have reviewed the triage vital signs and the nursing notes.  Pertinent labs & imaging results that were available during my care of the patient were reviewed by me and considered in my medical decision making (see chart for details).    Pt here with hip fracture. CT shows anterior column acetabular fracture variant, with involvement of the inferior pubic ramus and the anterior inferior  acetabular wall but not extending up to the iliac bone. CT also shows mild hematoma overlying the right inguinal region.Patient here with acute right acetabular fracture with displaced disruption of iliopubic line, possible right inferior pubis ramus fracture on x-ray. Patient is in no apparent distress, afebrile, hemodynamically stable. No evidence of other injury or trauma. Sensation intact to BLE. Good strength of right hip to hip flexion, extension, abduction, abduction. Distal pulses intact BLE. Patient unable to bear any weight.  Dr. Laverta Baltimore also saw and evaluated  pt and agrees with assessment and plan.   I spoke with Dr. Erlinda Hong, orthopedic surgery. He recommended patient be nonweightbearing for 2 weeks. He recommended patient to have PT here in hospital and to be transferred to a nursing home after that. He states he will see her in 1-2 weeks regarding today's visit.  20:08 I spoke with Dr. Tamala Julian, admitting physician, who agreed to admit the patient.    Final Clinical Impressions(s) / ED Diagnoses   Final diagnoses:  Closed fracture of right hip, initial encounter ([place])  Closed displaced fracture of anterior column of right acetabulum, initial encounter [facility])    New Prescriptions New Prescriptions   No medications on file     [place], [place] 07/11/16 2028    Margette Fast, MD 07/11/16 [phone]

## 2016-07-11 NOTE — H&P (Signed)
History and Physical    Cynthia Wright JKD:[id number] DOB: [birth year] DOA: 07/11/2016  Referring MD/NP/PA: Flonnie Overman, PA-C PCP: Aretta Nip, MD  [facility]  Chief Complaint: Fall  HPI: Cynthia Wright is a 83 y.o. female with medical history significant of HTN, HLD, DM type II, diastolic CHF, and A. fib on chronic anticoagulation; who presents after having a fall while having lunch at [employer] this afternoon. Patient reports getting up and somehow stumbling over her feet falling hard on her right hip. Patient notes this very difficult to put her weight on her right leg thereafter. No history of fractures, palpitations, lightheadedness, chest pain, focal weakness, or loss of consciousness. Reports associated symptoms of fatigue and urinary frequency, but patient is also on a diuretic for blood pressure .   Followed by Dr. Radford Pax with cardiology  ED Course: Upon admission into the emergency department patient was noted to have blood pressure 177/66 with all other vital signs relatively within normal limits. Labs revealed WBC 13.9, glucose 171, MCV 103.5. Imaging studies revealed acute acetabular fracture on the right with inferior pubic ramus fracture as well.  Review of Systems: As per HPI otherwise 10 point review of systems negative.   Past Medical History:  Diagnosis Date  . Atrial fibrillation ([place])    s/p DCCV 07/2012  . Benign diastolic hypertension   . Chronic anticoagulation   . Chronic diastolic CHF (congestive heart failure) (HCC)    EF now 65% by echo 10/2012 with grade II diasotlid dysfunction  . Diabetes mellitus without complication ([place])   . Dilated cardiomyopathy secondary to tachycardia ([place]) 07/2012   2D echo 10/2012 showed EF 65% with grade II diastolic dysfunction  . Elevated cholesterol   . Encounter for long-term (current) use of other medications   . Hyperlipidemia   . Hypertension   . Liver cyst      Past Surgical History:  Procedure Laterality Date  . BACK SURGERY     herniated disc  . CARDIOVERSION N/A 08/09/2012   Procedure: CARDIOVERSION;  Surgeon: Sueanne Margarita, MD;  Location: [facility];  Service: Cardiovascular;  Laterality: N/A;  . CARDIOVERSION N/A 09/28/2014   Procedure: CARDIOVERSION;  Surgeon: Sueanne Margarita, MD;  Location: [facility] ENDOSCOPY;  Service: Cardiovascular;  Laterality: N/A;  . TEE WITHOUT CARDIOVERSION N/A 08/09/2012   Procedure: TRANSESOPHAGEAL ECHOCARDIOGRAM (TEE);  Surgeon: Sueanne Margarita, MD;  Location: [facility] ENDOSCOPY;  Service: Cardiovascular;  Laterality: N/A;  talk to angie in [place].     reports that she has quit smoking. She has never used smokeless tobacco. She reports that she drinks alcohol. She reports that she does not use drugs.  Allergies  Allergen Reactions  . Codeine Nausea And Vomiting  . Lortab [Hydrocodone-Acetaminophen] Nausea And Vomiting    Family History  Problem Relation Age of Onset  . Heart disease Mother   . CAD Mother   . Heart disease Father   . CAD Father   . Pancreatic cancer Brother     Prior to Admission medications   Medication Sig Start Date End Date Taking? Authorizing Provider  amiodarone (PACERONE) 200 MG tablet TAKE 1/2 TABLET (100 MG TOTAL) BY MOUTH DAILY. 05/05/16  Yes Sueanne Margarita, MD  amLODipine (NORVASC) 5 MG tablet Take 1 tablet (5 mg total) by mouth daily. 06/25/16 06/20/17 Yes Sueanne Margarita, MD  docusate sodium (COLACE) 100 MG capsule Take 200 mg by mouth at bedtime.   Yes Historical  Provider, MD  ELIQUIS 2.5 MG TABS tablet TAKE 1 TABLET (2.5 MG TOTAL) BY MOUTH 2 (TWO) TIMES DAILY. 04/21/16  Yes Sueanne Margarita, MD  furosemide (LASIX) 20 MG tablet Take 20 mg by mouth daily as needed for edema.    Yes Historical Provider, MD  lisinopril (PRINIVIL,ZESTRIL) 40 MG tablet TAKE 1 TABLET (40 MG TOTAL) BY MOUTH DAILY. Patient taking differently: Take 40 mg by mouth daily.  04/22/16  Yes Sueanne Margarita, MD  Polyethyl  Glycol-Propyl Glycol (SYSTANE) 0.4-0.3 % SOLN Place 1-2 drops into both eyes 4 (four) times daily as needed (for dry eyes).   Yes Historical Provider, MD  potassium chloride SA (KLOR-CON M20) 20 MEQ tablet Take 2 tablets (40 mEq total) by mouth every other day. 05/23/16  Yes Sueanne Margarita, MD  rosuvastatin (CRESTOR) 5 MG tablet Take 5 mg by mouth at bedtime.    Yes Historical Provider, MD  sitaGLIPtin (JANUVIA) 100 MG tablet Take 100 mg by mouth daily.   Yes Historical Provider, MD    Physical Exam:    Constitutional:Elderly female in NAD, calm, comfortable while not moving in the hospital gurney. Vitals:   07/11/16 1618 07/11/16 1852 07/11/16 1854  BP: (!) 177/66 (!) 166/67 (!) 166/67  Pulse: (!) 57  (!) 57  Resp: 16  14  Temp: 97.9 F (36.6 C)    TempSrc: Oral    SpO2: 97%  97%  Weight: 59 kg (130 lb)    Height: 5\' 5"  (1.651 m)     Eyes: PERRL, lids and conjunctivae normal ENMT: Mucous membranes are moist. Posterior pharynx clear of any exudate or lesions.Normal dentition.  Neck: normal, supple, no masses, no thyromegaly Respiratory: clear to auscultation bilaterally, no wheezing, no crackles. Normal respiratory effort. No accessory muscle use.  Cardiovascular: Regular rate and rhythm, no murmurs / rubs / gallops. No extremity edema. 2+ pedal pulses. No carotid bruits.  Abdomen: no tenderness, no masses palpated. No hepatosplenomegaly. Bowel sounds positive.  Musculoskeletal: no clubbing / cyanosis. No joint deformity upper and lower extremities. Decreased range of motion of right leg secondary to pain Skin: no rashes, lesions, ulcers. No induration Neurologic: CN 2-12 grossly intact. Sensation intact, DTR normal. Strength 5/5 in all 4.  Psychiatric: Normal judgment and insight. Alert and oriented x 3. Normal mood.     Labs on Admission: I have personally reviewed following labs and imaging studies  CBC:  Recent Labs Lab 07/11/16 1847  WBC 13.9*  HGB 12.2  HCT 38.0    MCV 103.5*  PLT 627   Basic Metabolic Panel:  Recent Labs Lab 07/11/16 1847  NA 141  K 3.8  CL 107  CO2 26  GLUCOSE 171*  BUN 19  CREATININE 0.90  CALCIUM 8.9   GFR: Estimated Creatinine Clearance: 38.1 mL/min (by C-G formula based on SCr of 0.9 mg/dL). Liver Function Tests:  Recent Labs Lab 07/11/16 1847  AST 35  ALT 40  ALKPHOS 79  BILITOT 0.6  PROT 6.9  ALBUMIN 3.9   No results for input(s): LIPASE, AMYLASE in the last 168 hours. No results for input(s): AMMONIA in the last 168 hours. Coagulation Profile: No results for input(s): INR, PROTIME in the last 168 hours. Cardiac Enzymes: No results for input(s): CKTOTAL, CKMB, CKMBINDEX, TROPONINI in the last 168 hours. BNP (last 3 results) No results for input(s): PROBNP in the last 8760 hours. HbA1C: No results for input(s): HGBA1C in the last 72 hours. CBG: No results for input(s): GLUCAP in  the last 168 hours. Lipid Profile: No results for input(s): CHOL, HDL, LDLCALC, TRIG, CHOLHDL, LDLDIRECT in the last 72 hours. Thyroid Function Tests: No results for input(s): TSH, T4TOTAL, FREET4, T3FREE, THYROIDAB in the last 72 hours. Anemia Panel: No results for input(s): VITAMINB12, FOLATE, FERRITIN, TIBC, IRON, RETICCTPCT in the last 72 hours. Urine analysis: No results found for: COLORURINE, APPEARANCEUR, LABSPEC, PHURINE, GLUCOSEU, HGBUR, BILIRUBINUR, KETONESUR, PROTEINUR, UROBILINOGEN, NITRITE, LEUKOCYTESUR Sepsis Labs: No results found for this or any previous visit (from the past 240 hour(s)).   Radiological Exams on Admission: Ct Hip Right Wo Contrast  Result Date: 07/11/2016 CLINICAL DATA:  Fall landing on the right hip. Right hip pain. Acetabular fracture on conventional radiographs. EXAM: CT OF THE RIGHT HIP WITHOUT CONTRAST TECHNIQUE: Multidetector CT imaging of the right hip was performed according to the standard protocol. Multiplanar CT image reconstructions were also generated. COMPARISON:  07/11/2016  FINDINGS: Bones/Joint/Cartilage Anterior column acetabular fracture variant noted with involvement of the anterior inferior acetabular wall, and also with a fracture of the inferior pubic ramus. This fracture does not extend up into the iliac bone is best I can see. Today's CT imaging was performed only of the right hip cannot of the entire bony pelvis which precludes the ability to assess for other pelvic fractures. There is some indistinctness of the soft tissues anterior to the right piriformis muscle which could indicate mild hematoma or conceivably an occult right sacral fracture. Ligaments Suboptimally assessed by CT. Muscles and Tendons Unremarkable Soft tissues Some mild hematoma tracking in the right inguinal region. IMPRESSION: 1. Anterior column acetabular fracture variant, with involvement of the inferior pubic ramus and the anterior inferior acetabular wall but not extending up into the iliac bone. 2. Mild hematoma overlying the right inguinal region. There is some subtle indistinctness of tissue planes approaching the sciatic notch, which could indicate an otherwise occult sacral fracture. Today's CT exam only included the right hip and not the entire bony pelvis. Electronically Signed   By: Van Clines M.D.   On: 07/11/2016 19:49   Dg Hip Unilat W Or Wo Pelvis 2-3 Views Right  Result Date: 07/11/2016 CLINICAL DATA:  Fall landing on the right hip.  Right hip pain. EXAM: DG HIP (WITH OR WITHOUT PELVIS) 2-3V RIGHT COMPARISON:  None. FINDINGS: There is an acute acetabular fracture on the right with displaced disruption of the iliopubic line. Reduced intervertebral disc height at L5-S1. Bony demineralization. Questionable inferior pubic ramus fracture adjacent to the pubic body. IMPRESSION: 1. Acute right acetabular fracture. Possible right inferior pubic ramus fracture. Consider CT of the bony pelvis for further characterization. Electronically Signed   By: Van Clines M.D.   On:  07/11/2016 18:12    RCV:[id number]  Assessment/Plan Fall with acetabular fracture: Acute. She reports feeling off balance prior to fall. Imaging studies show a right acetabular fracture. Dr. Erlinda Hong of orthopedics consulted in ED, and recommendations as seen below. - Admit to telemetry bed  - Check orthostatic vital signs in laying, and sitting if able  - check vitamin B12 and folate given elevated MCV and reported loss of balance - Consult social work and case management - Tramadol/ fentanyl IV when necessary moderate to severe pain - Physical therapy to eval and treat  - Dr. Erlinda Hong, orthopedic surgery was consulted and recommended patient be nonweightbearing for 2 weeks,  PT here in hospital, and to be transferred to a nursing home after that. He states he will see her in 1-2 weeks regarding today's  visit  Leukocytosis: WBC 13.9 suspect reactive. - Follow-up urinalysis, and we will treat if found to be positive  Mild hematoma overlying the right inguinal region: Seen on CT scan following fall. - Continue to monitor    H/O Atrial fibrillation:  Patient appears to be in normal sinus rhythm at this time. - Continue amiodarone and Eliquis  Essential hypertension  - continue lisinopril, and amlodipine, and furosemide  Diastolic CHF: Last EF noted to be 60-65% with grade 2 diastolic dysfunction in 08/7891 . PHYSICAL exam patient does not appear to be fluid overloaded  Diabetes mellitus type 2 - Hypoglycemic protocol - Hold Januvia -  CBGs every before meals with sensitive sliding scale insulin   Hyperlipidemia  - Continue Crestor   DVT prophylaxis: Eliquis Code Status: Full Family Communication: Discuss plan of care with the patient and family present at bedside. Disposition Plan: TBD, but likely skilled nursing facility Consults called: Orthopedics  Admission status: Observation  Norval Morton MD [facility] Pager [phone]  If 7PM-7AM, please contact  night-coverage [email or web address] Password [facility]  07/11/2016, 8:14 PM

## 2016-07-11 NOTE — ED Triage Notes (Signed)
Pt is presented by EMS for evaluation of what sounds like a mechanical fall, tripped and fell landing on her right hip. She is complaining of mild pain, no obvious deformity, shortening or rotation noted.

## 2016-07-12 DIAGNOSIS — D72823 Leukemoid reaction: Secondary | ICD-10-CM

## 2016-07-12 DIAGNOSIS — S32401D Unspecified fracture of right acetabulum, subsequent encounter for fracture with routine healing: Secondary | ICD-10-CM | POA: Diagnosis not present

## 2016-07-12 DIAGNOSIS — E785 Hyperlipidemia, unspecified: Secondary | ICD-10-CM

## 2016-07-12 DIAGNOSIS — I10 Essential (primary) hypertension: Secondary | ICD-10-CM | POA: Diagnosis not present

## 2016-07-12 DIAGNOSIS — I5032 Chronic diastolic (congestive) heart failure: Secondary | ICD-10-CM | POA: Diagnosis not present

## 2016-07-12 DIAGNOSIS — E1169 Type 2 diabetes mellitus with other specified complication: Secondary | ICD-10-CM | POA: Diagnosis not present

## 2016-07-12 DIAGNOSIS — D72829 Elevated white blood cell count, unspecified: Secondary | ICD-10-CM | POA: Diagnosis present

## 2016-07-12 DIAGNOSIS — W19XXXA Unspecified fall, initial encounter: Secondary | ICD-10-CM | POA: Diagnosis present

## 2016-07-12 DIAGNOSIS — S32411A Displaced fracture of anterior wall of right acetabulum, initial encounter for closed fracture: Secondary | ICD-10-CM | POA: Diagnosis not present

## 2016-07-12 DIAGNOSIS — S32431A Displaced fracture of anterior column [iliopubic] of right acetabulum, initial encounter for closed fracture: Secondary | ICD-10-CM | POA: Diagnosis not present

## 2016-07-12 LAB — CBC
HCT: 33.4 % — ABNORMAL LOW (ref 36.0–46.0)
Hemoglobin: 10.9 g/dL — ABNORMAL LOW (ref 12.0–15.0)
MCH: 32.5 pg (ref 26.0–34.0)
MCHC: 32.6 g/dL (ref 30.0–36.0)
MCV: 99.7 fL (ref 78.0–100.0)
PLATELETS: 147 10*3/uL — AB (ref 150–400)
RBC: 3.35 MIL/uL — AB (ref 3.87–5.11)
RDW: 13.2 % (ref 11.5–15.5)
WBC: 7.5 10*3/uL (ref 4.0–10.5)

## 2016-07-12 LAB — BASIC METABOLIC PANEL
Anion gap: 6 (ref 5–15)
BUN: 17 mg/dL (ref 6–20)
CALCIUM: 8.7 mg/dL — AB (ref 8.9–10.3)
CHLORIDE: 109 mmol/L (ref 101–111)
CO2: 26 mmol/L (ref 22–32)
CREATININE: 0.8 mg/dL (ref 0.44–1.00)
Glucose, Bld: 140 mg/dL — ABNORMAL HIGH (ref 65–99)
Potassium: 3.8 mmol/L (ref 3.5–5.1)
SODIUM: 141 mmol/L (ref 135–145)

## 2016-07-12 LAB — VITAMIN B12: VITAMIN B 12: 273 pg/mL (ref 180–914)

## 2016-07-12 LAB — URINALYSIS, ROUTINE W REFLEX MICROSCOPIC
BILIRUBIN URINE: NEGATIVE
Glucose, UA: 50 mg/dL — AB
Ketones, ur: NEGATIVE mg/dL
NITRITE: POSITIVE — AB
PH: 5 (ref 5.0–8.0)
Protein, ur: NEGATIVE mg/dL
SPECIFIC GRAVITY, URINE: 1.015 (ref 1.005–1.030)

## 2016-07-12 LAB — GLUCOSE, CAPILLARY
GLUCOSE-CAPILLARY: 164 mg/dL — AB (ref 65–99)
GLUCOSE-CAPILLARY: 111 mg/dL — AB (ref 65–99)
GLUCOSE-CAPILLARY: 145 mg/dL — AB (ref 65–99)
Glucose-Capillary: 141 mg/dL — ABNORMAL HIGH (ref 65–99)

## 2016-07-12 LAB — ABO/RH: ABO/RH(D): O POS

## 2016-07-12 LAB — TSH: TSH: 3.414 u[IU]/mL (ref 0.350–4.500)

## 2016-07-12 MED ORDER — DEXTROSE 5 % IV SOLN
1.0000 g | INTRAVENOUS | Status: DC
  Filled 2016-07-12: qty 10

## 2016-07-12 MED ORDER — DEXTROSE 5 % IV SOLN
2.0000 g | Freq: Once | INTRAVENOUS | Status: DC
  Filled 2016-07-12: qty 2

## 2016-07-12 NOTE — Evaluation (Signed)
Physical Therapy Evaluation Patient Details Name: SHAWAN Wright MRN: [id number] DOB: Jan 27, 1925 Today's Date: 07/12/2016   History of Present Illness  83 y.o. female with right hip revealed anterior column acetabular fracture variant with involvement of the inferior pubic ramus following mechanical fall. PMH: HTN, DM, CHF, Afib, back surgery.   Clinical Impression  Pt admitted with above diagnosis. Pt currently with functional limitations due to the deficits listed below (see PT Problem List). Pt presenting with decreased mobility and activity tolerance, requiring physical assistance with bed mobility, transfers and gait. Pt able to ambulate 5 ft with assist. Recommending SNF for further rehabilitation following acute stay. Discussed with pt and she is in agreement. PT to continue to follow.    Follow Up Recommendations SNF    Equipment Recommendations   (to be determined)    Recommendations for Other Services       Precautions / Restrictions Precautions Precautions: Fall Restrictions Weight Bearing Restrictions: Yes RLE Weight Bearing: Non weight bearing      Mobility  Bed Mobility Overal bed mobility: Needs Assistance Bed Mobility: Supine to Sit     Supine to sit: Mod assist     General bed mobility comments: HOB elevated, assist at trunk to come to sitting.   Transfers Overall transfer level: Needs assistance Equipment used: Rolling walker (2 wheeled) Transfers: Sit to/from Stand Sit to Stand: Mod assist         General transfer comment: cues for hand placment and NWB status  Ambulation/Gait Ambulation/Gait assistance: Min assist Ambulation Distance (Feet): 5 Feet Assistive device: Rolling walker (2 wheeled) Gait Pattern/deviations: Step-to pattern Gait velocity: slow pattern   General Gait Details: repeated cues for sequence and weightbearing status provided as well as physical support for steps.   Stairs            Wheelchair Mobility     Modified Rankin (Stroke Patients Only)       Balance Overall balance assessment: Needs assistance Sitting-balance support: Single extremity supported Sitting balance-Leahy Scale: Fair     Standing balance support: Bilateral upper extremity supported Standing balance-Leahy Scale: Poor Standing balance comment: using rw for support                             Pertinent Vitals/Pain Pain Assessment:  (reports pain as moderate) Pain Location: Rt hip Pain Descriptors / Indicators: Aching Pain Intervention(s): Limited activity within patient's tolerance;Monitored during session;RN gave pain meds during session    Home Living Family/patient expects to be discharged to:: Unsure Living Arrangements: Alone Available Help at Discharge: Family;Available PRN/intermittently Type of Home: House Home Access: Level entry     Home Layout: One level Home Equipment: None Additional Comments: son lives close     Prior Function Level of Independence: Independent               Hand Dominance        Extremity/Trunk Assessment   Upper Extremity Assessment Upper Extremity Assessment: Generalized weakness    Lower Extremity Assessment Lower Extremity Assessment: Generalized weakness;RLE deficits/detail RLE Deficits / Details: difficulty moving Rt LE due to reports of pain.        Communication   Communication: No difficulties  Cognition Arousal/Alertness: Awake/alert Behavior During Therapy: WFL for tasks assessed/performed Overall Cognitive Status: Within Functional Limits for tasks assessed  General Comments General comments (skin integrity, edema, etc.): Pt did c/o dizziness throughout session but not worse with positional changes. BP sitting 174/67, standing 166/81.    Exercises     Assessment/Plan    PT Assessment Patient needs continued PT services  PT Problem List Decreased strength;Decreased  range of motion;Decreased activity tolerance;Decreased balance;Decreased mobility;Pain       PT Treatment Interventions DME instruction;Gait training;Stair training;Functional mobility training;Therapeutic activities;Therapeutic exercise;Balance training;Patient/family education    PT Goals (Current goals can be found in the Care Plan section)  Acute Rehab PT Goals Patient Stated Goal: get back home PT Goal Formulation: With patient Time For Goal Achievement: 07/26/16 Potential to Achieve Goals: Good    Frequency Min 3X/week   Barriers to discharge        Co-evaluation               End of Session Equipment Utilized During Treatment: Gait belt Activity Tolerance: Patient limited by fatigue;Patient limited by pain Patient left: in chair;with call bell/phone within reach;with chair alarm set;with nursing/sitter in room Nurse Communication: Mobility status;Weight bearing status PT Visit Diagnosis: Unsteadiness on feet (R26.81);History of falling (Z91.81)    Time: 5170-0174 PT Time Calculation (min) (ACUTE ONLY): 48 min   Charges:   PT Evaluation $PT Eval Moderate Complexity: 1 Procedure PT Treatments $Gait Training: 8-22 mins $Therapeutic Activity: 8-22 mins   PT G Codes:   PT G-Codes **NOT FOR INPATIENT CLASS** Functional Assessment Tool Used: AM-PAC 6 Clicks Basic Mobility;Clinical judgement Functional Limitation: Mobility: Walking and moving around Mobility: Walking and Moving Around Current Status (B4496): At least 40 percent but less than 60 percent impaired, limited or restricted Mobility: Walking and Moving Around Goal Status [phone]): At least 20 percent but less than 40 percent impaired, limited or restricted    Cassell Clement, PT, CSCS Pager [phone] Office [phone]   Linard Millers 07/12/2016, 9:06 AM

## 2016-07-12 NOTE — Progress Notes (Signed)
Attending notified. NCM spoke to pt and ABN issued. Jonnie Finner RN CCM Case Mgmt phone [phone]

## 2016-07-12 NOTE — Progress Notes (Signed)
Pharmacy Antibiotic Note  Cynthia Wright is a 83 y.o. female admitted on 07/11/2016 with UTI.  Pharmacy has been consulted for ceftriaxone dosing.  Plan:  Ceftriaxone 1gm IV q24h   Suggest stopping antibiotics if no symptoms consistent with UTI  No dose renal adjustment required, pharmacy will sign-off  Height: 5\' 5"  (165.1 cm) Weight: 130 lb 1.6 oz (59 kg) IBW/kg (Calculated) : 57  Temp (24hrs), Avg:98.3 F (36.8 C), Min:97.9 F (36.6 C), Max:98.6 F (37 C)   Recent Labs Lab 07/11/16 1847 07/12/16 0609  WBC 13.9* 7.5  CREATININE 0.90 0.80    Estimated Creatinine Clearance: 42.9 mL/min (by C-G formula based on SCr of 0.8 mg/dL).    Allergies  Allergen Reactions  . Codeine Nausea And Vomiting  . Lortab [Hydrocodone-Acetaminophen] Nausea And Vomiting    Antimicrobials this admission: 4/14 ceftriaxone >>  Dose adjustments this admission:  Microbiology results: 4/47 UCx:   Thank you for allowing pharmacy to be a part of this patient's care.  Doreene Eland, PharmD, BCPS.   Pager: [phone] 07/12/2016 7:48 AM

## 2016-07-12 NOTE — Clinical Social Work Note (Addendum)
Clinical Social Work Assessment  Patient Details  Name: Cynthia Wright MRN: [id number] Date of Birth: 1924-07-10  Date of referral:  07/12/16               Reason for consult:                   Permission sought to share information with:   (PATIENT WOULD NOT GIVE PERMISSION TO [place]) or call or send information to skilled nursing/rehab facilities.  Permission granted to share information::  No  Name::        Agency::     Relationship::     Contact Information:     Housing/Transportation Living arrangements for the past 2 months:  Single Family Home Source of Information:  Patient Patient Interpreter Needed:  None Criminal Activity/Legal Involvement Pertinent to Current Situation/Hospitalization:    Significant Relationships:  Adult Children Lives with:  Self Do you feel safe going back to the place where you live?  No Need for family participation in patient care:  Yes (Comment) (but patient would not allow SW to call family)  Care giving concerns:  *No caregiver and patient would not allow SW to call either of her sons. Once lives in town and the other lives in [place].    Social Worker assessment / plan:  LCSW met with patient at bedside to assess for services.  LCSW provided education on rehab/skilled nursing and insurance information.  Patient kept saying over and over that she was sure her insurance would pay even though LCSW kept providing same information.  Patient would need a 3 night inpatient stay for her insurance to pay for rehab/skilled nursing.  Patient surgeon reported that she must remain non weight bearing for two weeks and needs skilled nursing or family support.  Patient would not allow LCSW to call either of her sons, one is local and one is a doctor in [place]. We do not have Nathaneil Canary (the doctor in [place]) phone number and patient would not provide.   Employment status:  Retired Forensic scientist:  Medicare PT Recommendations:  [place] / Referral to community resources:     Patient/Family's Response to care:  Patient would not allow LCSW to call any family members.  Patient lives alone and has two sons one in town and one in [place].   Patient/Family's Understanding of and Emotional Response to Diagnosis, Current Treatment, and Prognosis:  Patient appears to understand that she will not have surgery but does not appear to understand the information given to her regarding skilled nursing or how her insurance works. She continued to state that she was sure her insurance would pay even though LCSW explained the observation status and that her insurance would require a 3 night inpatient stay to pay for skilled nursing/rehab.  Patient also did not appear to understand that observation status is usually only two nights and decisions need to be made today. Patient refused to let LCSW call family for help.   Emotional Assessment Appearance:  Appears younger than stated age Attitude/Demeanor/Rapport:  Apprehensive, Uncooperative Affect (typically observed):  Afraid/Fearful, Anxious, Apprehensive Orientation:  Oriented to Self, Oriented to Place, Oriented to  Time, Oriented to Situation Alcohol / Substance use:    Psych involvement (Current and /or in the community):     Discharge Needs  Concerns to be addressed:  Decision making concerns, Lack of Support (patient does not appear to understand what is being told to her regarding the skilled  nursing and insurance piece but would not allow SW to call either of her sons. One son is a doctor in [place] and patient would not provide number) Readmission within the last 30 days:  No Current discharge risk:  Dependent with Mobility, Lives alone Barriers to Discharge:  Inadequate or no insurance, Other (Patient's insuracne requires a 3 night inpaitent stay for they to pay for rehab.  patient reported that she has no money fo pay out of pocket and would not allow SW to call  either of her sons)   Carlean Jews, LCSW 07/12/2016, 4:57 PM

## 2016-07-12 NOTE — Care Management Obs Status (Addendum)
MEDICARE OBSERVATION STATUS NOTIFICATION   Patient Details  Name: Cynthia Wright MRN: [id number] Date of Birth: 1924-09-02   Medicare Observation Status Notification Given:  yes    Erenest Rasher, RN 07/12/2016, 8:25 PM

## 2016-07-12 NOTE — Discharge Summary (Addendum)
Physician Discharge Summary  Cynthia Wright [id number]:[id number] DOB: [birth year] DOA: 07/11/2016  PCP: Aretta Nip, MD  Admit date: 07/11/2016 Discharge date:  07/13/16  Admitted From: Home Disposition:  SNF  Recommendations for Outpatient Follow-up:  1. Follow up with PCP in 1-2 weeks 2. Please obtain BMP/CBC in one week 3. Follow up with orthopedic surgery, Dr. Marylynn Pearson Xu in 1-2 weeks   Discharge Condition: Stable CODE STATUS:FULL Diet recommendation: Heart Healthy / Carb Modified     Brief/Interim Summary: 83 year old female with history of hypertension, hyperlipidemia, diabetes mellitus, diastolic CHF, chronic atrial fibrillation presented after a mechanical fall while she was at [employer].  The patient was walking down the hallway when she tripped and fell onto her right hip. She had difficulty getting up and bearing weight. Initial plain films revealed her acute right acetabular fracture. CT of the right hip revealed anterior column acetabular fracture variant with involvement of the inferior pubic ramus. Orthopedics, Dr. Erlinda Hong, was consulted. He recommended nonsurgical treatment and nonweightbearing. The patient was admitted for further therapy and pain management.  Discharge Diagnoses:  Acute right acetabular fracture/inferior pubic ramus fracture -Orthopedics, Dr. Opal Sidles management; NWB on RLE anf follow up in office 1-2 weeks -PT eval-->SNF -pt having uncontrolled pain requiring IV opioid management -to SNF with oxycodone  Right inguinal hematoma/Acute Blood loss anemia -Asymptomatic clinically -Initial drop in Hgb 12.2-->10.9 but then remained stable thereafter -Monitor hemoglobin while on apixaban--CBC in one week -Hgb 13.3 on day of d/c  Leukocytosis -Secondary to stress demargination -Improved -Afebrile hemodynamically stable  Chronic atrial fibrillation -Rate controlled -Continue apixaban -Continue  amiodarone  Chronic diastolic CHF -Clinically compensated -Daily weights  Diabetes mellitus type 2 -On outpatient Januvia -NovoLog sliding scale -Controlled  Essential hypertension -Continue lisinopril and amlodipine -BP elevated due to pain  Hyperlipidemia -Continue Crestor  Macrocytosis -B12--273--low normal--start B12 supplementation   Discharge Instructions  Discharge Instructions    Diet - low sodium heart healthy    Complete by:  As directed    Increase activity slowly    Complete by:  As directed      Allergies as of 07/13/2016      Reactions   Codeine Nausea And Vomiting   Lortab [hydrocodone-acetaminophen] Nausea And Vomiting      Medication List    TAKE these medications   amiodarone 200 MG tablet Commonly known as:  PACERONE TAKE 1/2 TABLET (100 MG TOTAL) BY MOUTH DAILY.   amLODipine 5 MG tablet Commonly known as:  NORVASC Take 1 tablet (5 mg total) by mouth daily.   CRESTOR 5 MG tablet Generic drug:  rosuvastatin Take 5 mg by mouth at bedtime.   docusate sodium 100 MG capsule Commonly known as:  COLACE Take 200 mg by mouth at bedtime.   ELIQUIS 2.5 MG Tabs tablet Generic drug:  apixaban TAKE 1 TABLET (2.5 MG TOTAL) BY MOUTH 2 (TWO) TIMES DAILY.   furosemide 20 MG tablet Commonly known as:  LASIX Take 20 mg by mouth daily as needed for edema.   lisinopril 40 MG tablet Commonly known as:  PRINIVIL,ZESTRIL TAKE 1 TABLET (40 MG TOTAL) BY MOUTH DAILY. What changed:  how much to take  how to take this  when to take this  additional instructions   oxyCODONE 5 MG immediate release tablet Commonly known as:  Oxy IR/ROXICODONE Take 1 tablet (5 mg total) by mouth every 4 (four) hours as needed for moderate pain or severe pain.   potassium chloride  SA 20 MEQ tablet Commonly known as:  KLOR-CON M20 Take 2 tablets (40 mEq total) by mouth every other day.   senna 8.6 MG Tabs tablet Commonly known as:  SENOKOT Take 2 tablets (17.2  mg total) by mouth daily.   sitaGLIPtin 100 MG tablet Commonly known as:  JANUVIA Take 100 mg by mouth daily.   SYSTANE 0.4-0.3 % Soln Generic drug:  Polyethyl Glycol-Propyl Glycol Place 1-2 drops into both eyes 4 (four) times daily as needed (for dry eyes).       Allergies  Allergen Reactions  . Codeine Nausea And Vomiting  . Lortab [Hydrocodone-Acetaminophen] Nausea And Vomiting    Consultations:  Ortho--Dr. Jerilynn Mages. Xu   Procedures/Studies: Ct Hip Right Wo Contrast  Result Date: 07/11/2016 CLINICAL DATA:  Fall landing on the right hip. Right hip pain. Acetabular fracture on conventional radiographs. EXAM: CT OF THE RIGHT HIP WITHOUT CONTRAST TECHNIQUE: Multidetector CT imaging of the right hip was performed according to the standard protocol. Multiplanar CT image reconstructions were also generated. COMPARISON:  07/11/2016 FINDINGS: Bones/Joint/Cartilage Anterior column acetabular fracture variant noted with involvement of the anterior inferior acetabular wall, and also with a fracture of the inferior pubic ramus. This fracture does not extend up into the iliac bone is best I can see. Today's CT imaging was performed only of the right hip cannot of the entire bony pelvis which precludes the ability to assess for other pelvic fractures. There is some indistinctness of the soft tissues anterior to the right piriformis muscle which could indicate mild hematoma or conceivably an occult right sacral fracture. Ligaments Suboptimally assessed by CT. Muscles and Tendons Unremarkable Soft tissues Some mild hematoma tracking in the right inguinal region. IMPRESSION: 1. Anterior column acetabular fracture variant, with involvement of the inferior pubic ramus and the anterior inferior acetabular wall but not extending up into the iliac bone. 2. Mild hematoma overlying the right inguinal region. There is some subtle indistinctness of tissue planes approaching the sciatic notch, which could indicate an  otherwise occult sacral fracture. Today's CT exam only included the right hip and not the entire bony pelvis. Electronically Signed   By: Van Clines M.D.   On: 07/11/2016 19:49   Dg Hip Unilat W Or Wo Pelvis 2-3 Views Right  Result Date: 07/11/2016 CLINICAL DATA:  Fall landing on the right hip.  Right hip pain. EXAM: DG HIP (WITH OR WITHOUT PELVIS) 2-3V RIGHT COMPARISON:  None. FINDINGS: There is an acute acetabular fracture on the right with displaced disruption of the iliopubic line. Reduced intervertebral disc height at L5-S1. Bony demineralization. Questionable inferior pubic ramus fracture adjacent to the pubic body. IMPRESSION: 1. Acute right acetabular fracture. Possible right inferior pubic ramus fracture. Consider CT of the bony pelvis for further characterization. Electronically Signed   By: Van Clines M.D.   On: 07/11/2016 18:12        Discharge Exam: Vitals:   07/12/16 2045 07/13/16 0549  BP: (!) 164/63 (!) 165/62  Pulse: 70 71  Resp: 18 18  Temp: 98.4 F (36.9 C) 98.4 F (36.9 C)   Vitals:   07/12/16 0946 07/12/16 1352 07/12/16 2045 07/13/16 0549  BP: (!) 175/76 (!) 179/61 (!) 164/63 (!) 165/62  Pulse: 66 72 70 71  Resp: 18 18 18 18   Temp: 98.4 F (36.9 C) 97.3 F (36.3 C) 98.4 F (36.9 C) 98.4 F (36.9 C)  TempSrc: Oral Oral Oral Oral  SpO2: 98% 97% 100% 98%  Weight:  Height:        General: Pt is alert, awake, not in acute distress Cardiovascular: RRR, S1/S2 +, no rubs, no gallops Respiratory: CTA bilaterally, no wheezing, no rhonchi Abdominal: Soft, NT, ND, bowel sounds + Extremities: no edema, no cyanosis   The results of significant diagnostics from this hospitalization (including imaging, microbiology, ancillary and laboratory) are listed below for reference.    Significant Diagnostic Studies: Ct Hip Right Wo Contrast  Result Date: 07/11/2016 CLINICAL DATA:  Fall landing on the right hip. Right hip pain. Acetabular fracture on  conventional radiographs. EXAM: CT OF THE RIGHT HIP WITHOUT CONTRAST TECHNIQUE: Multidetector CT imaging of the right hip was performed according to the standard protocol. Multiplanar CT image reconstructions were also generated. COMPARISON:  07/11/2016 FINDINGS: Bones/Joint/Cartilage Anterior column acetabular fracture variant noted with involvement of the anterior inferior acetabular wall, and also with a fracture of the inferior pubic ramus. This fracture does not extend up into the iliac bone is best I can see. Today's CT imaging was performed only of the right hip cannot of the entire bony pelvis which precludes the ability to assess for other pelvic fractures. There is some indistinctness of the soft tissues anterior to the right piriformis muscle which could indicate mild hematoma or conceivably an occult right sacral fracture. Ligaments Suboptimally assessed by CT. Muscles and Tendons Unremarkable Soft tissues Some mild hematoma tracking in the right inguinal region. IMPRESSION: 1. Anterior column acetabular fracture variant, with involvement of the inferior pubic ramus and the anterior inferior acetabular wall but not extending up into the iliac bone. 2. Mild hematoma overlying the right inguinal region. There is some subtle indistinctness of tissue planes approaching the sciatic notch, which could indicate an otherwise occult sacral fracture. Today's CT exam only included the right hip and not the entire bony pelvis. Electronically Signed   By: Van Clines M.D.   On: 07/11/2016 19:49   Dg Hip Unilat W Or Wo Pelvis 2-3 Views Right  Result Date: 07/11/2016 CLINICAL DATA:  Fall landing on the right hip.  Right hip pain. EXAM: DG HIP (WITH OR WITHOUT PELVIS) 2-3V RIGHT COMPARISON:  None. FINDINGS: There is an acute acetabular fracture on the right with displaced disruption of the iliopubic line. Reduced intervertebral disc height at L5-S1. Bony demineralization. Questionable inferior pubic ramus  fracture adjacent to the pubic body. IMPRESSION: 1. Acute right acetabular fracture. Possible right inferior pubic ramus fracture. Consider CT of the bony pelvis for further characterization. Electronically Signed   By: Van Clines M.D.   On: 07/11/2016 18:12     Microbiology: No results found for this or any previous visit (from the past 240 hour(s)).   Labs: Basic Metabolic Panel:  Recent Labs Lab 07/11/16 1847 07/12/16 0609  NA 141 141  K 3.8 3.8  CL 107 109  CO2 26 26  GLUCOSE 171* 140*  BUN 19 17  CREATININE 0.90 0.80  CALCIUM 8.9 8.7*   Liver Function Tests:  Recent Labs Lab 07/11/16 1847  AST 35  ALT 40  ALKPHOS 79  BILITOT 0.6  PROT 6.9  ALBUMIN 3.9   No results for input(s): LIPASE, AMYLASE in the last 168 hours. No results for input(s): AMMONIA in the last 168 hours. CBC:  Recent Labs Lab 07/11/16 1847 07/12/16 0609 07/13/16 0610  WBC 13.9* 7.5 10.1  HGB 12.2 10.9* 13.3  HCT 38.0 33.4* 38.7  MCV 103.5* 99.7 101.6*  PLT 166 147* 179   Cardiac Enzymes: No results for  input(s): CKTOTAL, CKMB, CKMBINDEX, TROPONINI in the last 168 hours. BNP: Invalid input(s): POCBNP CBG:  Recent Labs Lab 07/12/16 0751 07/12/16 1142 07/12/16 1647 07/12/16 2152 07/13/16 0746  GLUCAP 141* 164* 111* 145* 135*    Time coordinating discharge:  Greater than 30 minutes  Signed:  Atia Haupt, DO [facility] Pager: [phone] 07/13/2016, 8:30 AM

## 2016-07-12 NOTE — Progress Notes (Addendum)
PROGRESS NOTE  Cynthia Wright [id number] DOB: [birth year] DOA: 07/11/2016 PCP: Aretta Nip, MD  Brief History:  83 year old female with history of hypertension, hyperlipidemia, diabetes mellitus, diastolic CHF, chronic atrial fibrillation presented after a mechanical fall while she was at [employer].  The patient was walking down the hallway when she tripped and fell onto her right hip. She had difficulty getting up and bearing weight. Initial plain films revealed her acute right acetabular fracture. CT of the right hip revealed anterior column acetabular fracture variant with involvement of the inferior pubic ramus. Orthopedics, Dr. Erlinda Hong, was consulted. He recommended nonsurgical treatment and nonweightbearing. The patient was admitted for further therapy.  Assessment/Plan: Acute right acetabular fracture/inferior pubic ramus fracture -Orthopedics, Dr. Opal Sidles management; NWB on RLE anf follow up in office 1-2 weeks -PT eval -pt having uncontrolled pain requiring IV opioid management  Right inguinal hematoma -Asymptomatic clinically -Monitor hemoglobin while on apixaban--if continued drop, may need to discontinue apixaban temporarily  Leukocytosis -Secondary to stress demargination -Improved -Afebrile hemodynamically stable  Chronic atrial fibrillation -Rate controlled -Continue apixaban -Continue amiodarone  Chronic diastolic CHF -Clinically compensated -Daily weights  Diabetes mellitus type 2 -On outpatient Januvia -NovoLog sliding scale -Hemoglobin A1c  Essential hypertension -Continue lisinopril and amlodipine  Hyperlipidemia -Continue Crestor   Disposition Plan:   Home vs SNF 1-2 days Family Communication:  No Family at bedside  Consultants:  Ortho, Dr. Erlinda Hong  Code Status:  FULL   DVT Prophylaxis:  apixaban   Procedures: As Listed in Progress Note Above  Antibiotics: None    Subjective: Patient  denies fevers, chills, headache, chest pain, dyspnea, nausea, vomiting, diarrhea, abdominal pain, dysuria, hematuria, hematochezia, and melena.   Objective: Vitals:   07/11/16 1854 07/11/16 1900 07/11/16 2120 07/12/16 0448  BP: (!) 166/67 (!) 166/68 (!) 158/60 (!) 177/48  Pulse: (!) 57 61 (!) 52 67  Resp: 14  18 18   Temp:   98.5 F (36.9 C) 98.6 F (37 C)  TempSrc:   Oral Oral  SpO2: 97% 95% 97% 98%  Weight:   59 kg (130 lb 1.6 oz)   Height:   5\' 5"  (1.651 m)     Intake/Output Summary (Last 24 hours) at 07/12/16 0824 Last data filed at 07/11/16 2300  Gross per 24 hour  Intake              200 ml  Output                0 ml  Net              200 ml   Weight change:  Exam:   General:  Pt is alert, follows commands appropriately, not in acute distress  HEENT: No icterus, No thrush, No neck mass, [place]/AT  Cardiovascular: RRR, S1/S2, no rubs, no gallops  Respiratory: CTA bilaterally, no wheezing, no crackles, no rhonchi  Abdomen: Soft/+BS, non tender, non distended, no guarding  Extremities: No edema, No lymphangitis, No petechiae, No rashes, no synovitis   Data Reviewed: I have personally reviewed following labs and imaging studies Basic Metabolic Panel:  Recent Labs Lab 07/11/16 1847 07/12/16 0609  NA 141 141  K 3.8 3.8  CL 107 109  CO2 26 26  GLUCOSE 171* 140*  BUN 19 17  CREATININE 0.90 0.80  CALCIUM 8.9 8.7*   Liver Function Tests:  Recent Labs Lab 07/11/16 1847  AST 35  ALT 40  ALKPHOS 79  BILITOT 0.6  PROT 6.9  ALBUMIN 3.9   No results for input(s): LIPASE, AMYLASE in the last 168 hours. No results for input(s): AMMONIA in the last 168 hours. Coagulation Profile: No results for input(s): INR, PROTIME in the last 168 hours. CBC:  Recent Labs Lab 07/11/16 1847 07/12/16 0609  WBC 13.9* 7.5  HGB 12.2 10.9*  HCT 38.0 33.4*  MCV 103.5* 99.7  PLT 166 147*   Cardiac Enzymes: No results for input(s): CKTOTAL, CKMB, CKMBINDEX, TROPONINI in  the last 168 hours. BNP: Invalid input(s): POCBNP CBG:  Recent Labs Lab 07/12/16 0751  GLUCAP 141*   HbA1C: No results for input(s): HGBA1C in the last 72 hours. Urine analysis:    Component Value Date/Time   COLORURINE YELLOW 07/12/2016 0029   APPEARANCEUR HAZY (A) 07/12/2016 0029   LABSPEC 1.015 07/12/2016 0029   PHURINE 5.0 07/12/2016 0029   GLUCOSEU 50 (A) 07/12/2016 0029   HGBUR MODERATE (A) 07/12/2016 0029   BILIRUBINUR NEGATIVE 07/12/2016 0029   KETONESUR NEGATIVE 07/12/2016 0029   PROTEINUR NEGATIVE 07/12/2016 0029   NITRITE POSITIVE (A) 07/12/2016 0029   LEUKOCYTESUR SMALL (A) 07/12/2016 0029   Sepsis Labs: @LABRCNTIP (WVPXTGGYIRSWN:4,OEVOJJKKXFG:1) )No results found for this or any previous visit (from the past 240 hour(s)).   Scheduled Meds: . amiodarone  100 mg Oral Daily  . amLODipine  5 mg Oral Daily  . apixaban  2.5 mg Oral BID  . docusate sodium  200 mg Oral QHS  . insulin aspart  0-9 Units Subcutaneous TID WC  . lisinopril  40 mg Oral Daily  . potassium chloride SA  40 mEq Oral QODAY  . rosuvastatin  5 mg Oral QHS   Continuous Infusions:  Procedures/Studies: Ct Hip Right Wo Contrast  Result Date: 07/11/2016 CLINICAL DATA:  Fall landing on the right hip. Right hip pain. Acetabular fracture on conventional radiographs. EXAM: CT OF THE RIGHT HIP WITHOUT CONTRAST TECHNIQUE: Multidetector CT imaging of the right hip was performed according to the standard protocol. Multiplanar CT image reconstructions were also generated. COMPARISON:  07/11/2016 FINDINGS: Bones/Joint/Cartilage Anterior column acetabular fracture variant noted with involvement of the anterior inferior acetabular wall, and also with a fracture of the inferior pubic ramus. This fracture does not extend up into the iliac bone is best I can see. Today's CT imaging was performed only of the right hip cannot of the entire bony pelvis which precludes the ability to assess for other pelvic fractures.  There is some indistinctness of the soft tissues anterior to the right piriformis muscle which could indicate mild hematoma or conceivably an occult right sacral fracture. Ligaments Suboptimally assessed by CT. Muscles and Tendons Unremarkable Soft tissues Some mild hematoma tracking in the right inguinal region. IMPRESSION: 1. Anterior column acetabular fracture variant, with involvement of the inferior pubic ramus and the anterior inferior acetabular wall but not extending up into the iliac bone. 2. Mild hematoma overlying the right inguinal region. There is some subtle indistinctness of tissue planes approaching the sciatic notch, which could indicate an otherwise occult sacral fracture. Today's CT exam only included the right hip and not the entire bony pelvis. Electronically Signed   By: Van Clines M.D.   On: 07/11/2016 19:49   Dg Hip Unilat W Or Wo Pelvis 2-3 Views Right  Result Date: 07/11/2016 CLINICAL DATA:  Fall landing on the right hip.  Right hip pain. EXAM: DG HIP (WITH OR WITHOUT PELVIS) 2-3V RIGHT COMPARISON:  None. FINDINGS: There is an acute  acetabular fracture on the right with displaced disruption of the iliopubic line. Reduced intervertebral disc height at L5-S1. Bony demineralization. Questionable inferior pubic ramus fracture adjacent to the pubic body. IMPRESSION: 1. Acute right acetabular fracture. Possible right inferior pubic ramus fracture. Consider CT of the bony pelvis for further characterization. Electronically Signed   By: Van Clines M.D.   On: 07/11/2016 18:12    Antwan Bribiesca, DO  [facility] Pager [phone]  If 7PM-7AM, please contact night-coverage [email or web address] Password [email or web address] 07/12/2016, 8:24 AM   LOS: 0 days

## 2016-07-12 NOTE — Care Management Obs Status (Deleted)
MEDICARE OBSERVATION STATUS NOTIFICATION   Patient Details  Name: Cynthia Wright MRN: [id number] Date of Birth: Aug 05, 1924   Medicare Observation Status Notification Given:  Yes    Erenest Rasher, RN 07/12/2016, 8:26 PM

## 2016-07-13 DIAGNOSIS — I10 Essential (primary) hypertension: Secondary | ICD-10-CM | POA: Diagnosis not present

## 2016-07-13 DIAGNOSIS — S32411A Displaced fracture of anterior wall of right acetabulum, initial encounter for closed fracture: Secondary | ICD-10-CM | POA: Diagnosis not present

## 2016-07-13 DIAGNOSIS — S32401G Unspecified fracture of right acetabulum, subsequent encounter for fracture with delayed healing: Secondary | ICD-10-CM | POA: Diagnosis not present

## 2016-07-13 DIAGNOSIS — E785 Hyperlipidemia, unspecified: Secondary | ICD-10-CM | POA: Diagnosis not present

## 2016-07-13 DIAGNOSIS — E1169 Type 2 diabetes mellitus with other specified complication: Secondary | ICD-10-CM | POA: Diagnosis not present

## 2016-07-13 LAB — CBC
HCT: 38.7 % (ref 36.0–46.0)
Hemoglobin: 13.3 g/dL (ref 12.0–15.0)
MCH: 34.9 pg — ABNORMAL HIGH (ref 26.0–34.0)
MCHC: 34.4 g/dL (ref 30.0–36.0)
MCV: 101.6 fL — ABNORMAL HIGH (ref 78.0–100.0)
Platelets: 179 10*3/uL (ref 150–400)
RBC: 3.81 MIL/uL — ABNORMAL LOW (ref 3.87–5.11)
RDW: 13.4 % (ref 11.5–15.5)
WBC: 10.1 10*3/uL (ref 4.0–10.5)

## 2016-07-13 LAB — GLUCOSE, CAPILLARY
GLUCOSE-CAPILLARY: 135 mg/dL — AB (ref 65–99)
Glucose-Capillary: 200 mg/dL — ABNORMAL HIGH (ref 65–99)
Glucose-Capillary: 130 mg/dL — ABNORMAL HIGH (ref 65–99)

## 2016-07-13 MED ORDER — SENNA 8.6 MG PO TABS: 2.0000 | ORAL_TABLET | Freq: Every day | ORAL | 0 refills | Status: DC

## 2016-07-13 MED ORDER — OXYCODONE HCL 5 MG PO TABS: 5.0000 mg | ORAL_TABLET | ORAL | 0 refills | Status: DC | PRN

## 2016-07-13 MED ORDER — CYANOCOBALAMIN 250 MCG PO TABS: 250.0000 ug | ORAL_TABLET | Freq: Every day | ORAL | 0 refills | Status: DC

## 2016-07-13 MED ORDER — OXYCODONE HCL 5 MG PO TABS: 5.0000 mg | ORAL_TABLET | ORAL | Status: DC | PRN

## 2016-07-13 MED ORDER — SENNA 8.6 MG PO TABS
2.0000 | ORAL_TABLET | Freq: Every day | ORAL | Status: DC
  Administered 2016-07-13: 17.2 mg via ORAL
  Filled 2016-07-13: qty 2

## 2016-07-13 MED ORDER — CYANOCOBALAMIN 250 MCG PO TABS
250.0000 ug | ORAL_TABLET | Freq: Every day | ORAL | Status: DC
  Administered 2016-07-13: 250 ug via ORAL
  Filled 2016-07-13: qty 1

## 2016-07-13 NOTE — Progress Notes (Signed)
62 NCM and CSW discussed with pt and pt's Daughter-in-law, Caprice Beaver options Home with [facility] or SNF-rehab. DIL states pt is unable to return home and that she and pt's son are unable to care for her in their home. CSW provided several options for SNF -rehab and that they were willing to accept pt today without paying the cost tonight but SNF will work with family tomorrow for payments. DIL states pt receives a SS check but other resources are limited. She has two sons that will have to work with getting finances together for SNF. Pt owns her home and DIL states they may have to look into options for long-term ie selling pt's home. There are questions about will the patient be able to return to the home to live if there are concerns for her safety. Her other son lives in another state. Explained while pt is at rehab this will give the family time to make long term decisions. Pt does not have [place]. Reviewed with DIL observation notice and ABN. Explained cost on ABN are an estimate. [facility] notified Hospital Management with updates as instructed by department. Jonnie Finner RN CCM Case Mgmt phone [phone]

## 2016-07-13 NOTE — Clinical Social Work Placement (Signed)
   CLINICAL SOCIAL WORK PLACEMENT  NOTE  Date:  07/13/2016  Patient Details  Name: Cynthia Wright MRN: [id number] Date of Birth: 21-Mar-1925  Clinical Social Work is seeking post-discharge placement for this patient at the [place] level of care (*CSW will initial, date and re-position this form in  chart as items are completed):  Yes   Patient/family provided with [place] Work Department's list of facilities offering this level of care within the geographic area requested by the patient (or if unable, by the patient's family).  Yes   Patient/family informed of their freedom to choose among providers that offer the needed level of care, that participate in Medicare, Medicaid or managed care program needed by the patient, have an available bed and are willing to accept the patient.  Yes   Patient/family informed of [place]'s ownership interest in [facility] and [facility], as well as of the fact that they are under no obligation to receive care at these facilities.  PASRR submitted to EDS on 07/13/16     PASRR number received on    07/13/16   Existing PASRR number confirmed on 07/13/16     FL2 transmitted to all facilities in geographic area requested by pt/family on       FL2 transmitted to all facilities within larger geographic area on 07/13/16     Patient informed that his/her managed care company has contracts with or will negotiate with certain facilities, including the following:        Yes   Patient/family informed of bed offers received.  Patient chooses bed at [facility]     Physician recommends and patient chooses bed at      Patient to be transferred to [facility] on  .  Patient to be transferred to facility by [facility]     Patient family notified on 07/13/16 of transfer.  Name of family member notified:  Alroy Bailiff     PHYSICIAN     Dr. Carles Collet  Additional Comment:     _______________________________________________ Claudine Mouton, [place] 07/13/2016, 5:01 PM

## 2016-07-13 NOTE — Progress Notes (Addendum)
Family agreed to D/C and picked [facility] place.  Family confirmed this was their choice to discharge tonight.  SW will facilitate.  Pt is medically stable for D/C to [facility].  CSW will call and arrange EMS for transport.  Per admissions coordinator at [facility] pt will go to room 1006-B.  CSW sent D/C orders to [facility] (admissions person) via the hub.  CSW contacted/spoke to the pt's daughter-in-law and they are aware of the D/C plan.  Pt's daughter-in-law is in agreement with the plan.  Please reconsult with CSW in future if needed.   # for report is: [phone]  Alphonse Guild. Jacobi Ryant, LCSWA, LCAS

## 2016-07-13 NOTE — Care Management Note (Signed)
Case Management Note  Patient Details  Name: Cynthia Wright MRN: [id number] Date of Birth: 1924/04/28  Subjective/Objective:   Acute right acetabular fx/inferior pubic ramus fx                 Action/Plan: Discharge Planning: 07/12/2016 8:26 pm NCM spoke to pt and she lives at home alone. Gave permission to speak to son, Shanon Brow. States to call in am.   07/13/2016 1100 NCM contacted son, Tracye Szuch # [phone]. States they do want pt to go to rehab. Explained it would be an out of pocket cost. Contacted CSW to follow up with son, Apolonia Ellwood.    Expected Discharge Date:  07/13/16               Expected Discharge Plan:  Skilled Nursing Facility  In-House Referral:  Clinical Social Work  Discharge planning Services  CM Consult  Post Acute Care Choice:  NA Choice offered to:  NA  DME Arranged:  N/A DME Agency:  NA  HH Arranged:  NA HH Agency:  NA  Status of Service:  Completed, signed off  If discussed at [employer] of Stay Meetings, dates discussed:    Additional Comments:  Erenest Rasher, RN 07/13/2016, 11:24 AM

## 2016-07-13 NOTE — Progress Notes (Signed)
Writer gave report to receiving nurse at [facility], Mrs. Solmon Ice. Family aware of transfer.

## 2016-07-13 NOTE — Progress Notes (Addendum)
Consult request has been received. CSW attempting to follow up at present time. CSW met with pt and RN and established pt does not meet Medicare A & B requirement for 3 day inpatient stay that will pay for SNF.  Pt gave verbal permission for CSW to speak with family.  CSW spoke with pt's son Dr. Ethel Rana at ph: [phone] who said he would talk with his brother/sister-in-law and determine whether they will take pt home pr pay private pay.  CSW provided pt's son with approx cost of private pay 30 day stay ($7000-$7500 per Tammy at [facility]) and will create FL-2, send referrals out to area facilities via the hub and provide list of SNF's to pt.   Alphonse Guild. Cheryn Lundquist, Latanya Presser, LCAS Clinical Social Worker Ph: [phone]

## 2016-07-13 NOTE — Progress Notes (Signed)
Pt leaving this evening with [facility], headed to [facility]. Daughter-in-law present and aware of transfer. Writer phoned son in [place] and alerted him of transfer.

## 2016-07-13 NOTE — NC FL2 (Signed)
[place] LEVEL OF CARE SCREENING TOOL     IDENTIFICATION  Patient Name: Cynthia Wright Birthdate: [birth year] Sex: female Admission Date (Current Location): 07/11/2016  [facility] and [place] Number:  Herbalist and Address:  [facility],  [place] [address] [place]      Provider Number: [id number]  Attending Physician Name and Address:  Orson Eva, MD  Relative Name and Phone Number:       Current Level of Care: Hospital Recommended Level of Care: [place] Prior Approval Number:    Date Approved/Denied:   PASRR Number: [id number]  Discharge Plan: SNF    Current Diagnoses: Patient Active Problem List   Diagnosis Date Noted  . Fall 07/12/2016  . Leukocytosis 07/12/2016  . Type 2 diabetes mellitus with hyperlipidemia ([place]) 07/12/2016  . History of atrial fibrillation 07/12/2016  . Acetabular fracture ([place]) 07/11/2016  . Dilated cardiomyopathy secondary to tachycardia ([place]) 12/14/2013  . Syncope 06/14/2013  . Bradycardia 05/11/2013  . Fatigue 05/11/2013  . Chronic diastolic CHF (congestive heart failure) ([place]) 01/06/2013  . Mitral regurgitation 08/06/2012  . Persistent atrial fibrillation ([place]) 08/02/2012  . Essential hypertension, benign 08/02/2012  . Pure hypercholesterolemia 08/02/2012  . Liver cyst 08/02/2012    Orientation RESPIRATION BLADDER Height & Weight     Self, Time, Situation, Place  Normal (Room Air) Incontinent Weight: 130 lb 1.6 oz (59 kg) Height:  5\' 5"  (165.1 cm)  BEHAVIORAL SYMPTOMS/MOOD NEUROLOGICAL BOWEL NUTRITION STATUS      Continent Diet (Regular)  AMBULATORY STATUS COMMUNICATION OF NEEDS Skin   Limited Assist Verbally Normal                       Personal Care Assistance Level of Assistance  Bathing, Dressing Bathing Assistance: Limited assistance   Dressing Assistance: Limited assistance     Functional Limitations Info             SPECIAL CARE FACTORS  FREQUENCY  PT (By licensed PT), OT (By licensed OT)     PT Frequency: 5 OT Frequency: 5            Contractures Contractures Info: Not present    Additional Factors Info  Code Status, Allergies Code Status Info: Full Allergies Info: Codeine, Lortab Hydrocodone-acetaminophen           Current Medications (07/13/2016):  This is the current hospital active medication list Current Facility-Administered Medications  Medication Dose Route Frequency Provider Last Rate Last Dose  . amiodarone (PACERONE) tablet 100 mg  100 mg Oral Daily Norval Morton, MD   100 mg at 07/13/16 1234  . amLODipine (NORVASC) tablet 5 mg  5 mg Oral Daily Rondell A Tamala Julian, MD   5 mg at 07/13/16 1112  . apixaban (ELIQUIS) tablet 2.5 mg  2.5 mg Oral BID Norval Morton, MD   2.5 mg at 07/13/16 1112  . docusate sodium (COLACE) capsule 200 mg  200 mg Oral QHS Norval Morton, MD   200 mg at 07/12/16 2137  . fentaNYL (SUBLIMAZE) injection 25 mcg  25 mcg Intravenous Q2H PRN Norval Morton, MD   25 mcg at 07/12/16 3151  . furosemide (LASIX) tablet 20 mg  20 mg Oral Daily PRN Norval Morton, MD      . insulin aspart (novoLOG) injection 0-9 Units  0-9 Units Subcutaneous TID WC Norval Morton, MD   2 Units at 07/13/16 1235  . lisinopril (PRINIVIL,ZESTRIL) tablet  40 mg  40 mg Oral Daily Norval Morton, MD   40 mg at 07/13/16 1112  . ondansetron (ZOFRAN) injection 4 mg  4 mg Intravenous Q6H PRN Norval Morton, MD      . oxyCODONE (Oxy IR/ROXICODONE) immediate release tablet 5 mg  5 mg Oral Q4H PRN Orson Eva, MD      . polyvinyl alcohol (LIQUIFILM TEARS) 1.4 % ophthalmic solution 1-2 drop  1-2 drop Both Eyes QID PRN Rondell A Tamala Julian, MD      . potassium chloride SA (K-DUR,KLOR-CON) CR tablet 40 mEq  40 mEq Oral QODAY Norval Morton, MD   40 mEq at 07/13/16 1113  . rosuvastatin (CRESTOR) tablet 5 mg  5 mg Oral QHS Norval Morton, MD   5 mg at 07/12/16 2137  . senna (SENOKOT) tablet 17.2 mg  2 tablet Oral Daily Orson Eva, MD   17.2 mg at 07/13/16 1111  . traMADol (ULTRAM) tablet 50 mg  50 mg Oral Q6H PRN Norval Morton, MD   50 mg at 07/12/16 1858  . vitamin B-12 (CYANOCOBALAMIN) tablet 250 mcg  250 mcg Oral Daily Orson Eva, MD   250 mcg at 07/13/16 1113     Discharge Medications: Please see discharge summary for a list of discharge medications.  Relevant Imaging Results:  Relevant Lab Results:   Additional Information [id number]  Alphonse Guild Erik Nessel, LCSWA

## 2016-07-13 NOTE — Progress Notes (Signed)
CSW called [facility].  CSW signing off.  Alphonse Guild. Aerielle Stoklosa, Latanya Presser, LCAS Clinical Social Worker Ph: [phone]

## 2016-07-13 NOTE — Progress Notes (Addendum)
CSW and CM met with pt and pt's DIL and CM clarified that pt was and is in observation unit and that refusing D/C and staying another night.  Pt's DIL stated she understood and understood family would have to privately pay another night if staying, asked the difference between paying for another night at the hospital and a night at the SNF, CM stated "I do not know".  Family insisted they will stay "regardless".   4:06 PM CSW asked again if family understood that another night is not a "qualifying stay", DIL stated she understood.  CSW offered to place pt tonight again and DIL refused.   4:22 PM CSW entered room after CM had already met with DIL.  CSW and CM met with pt's DIL again and reiterated a D/C has been signed and pt's plan is to be D/C'd.  Pt's DIL reiterated her previous questions and insisted pt not be discharged.  CM reiterated Medicare requirements and plan for D/C.  DIL requested to speak with the doctor again and left.       Cynthia Wright. Cynthia Wright, Cynthia Wright, LCAS Clinical Social Worker Ph: [phone]

## 2016-07-13 NOTE — Progress Notes (Signed)
CSW met with pt's DIL who stated to the CSW she had spoken to the pt's son , a Dr. Ethel Rana and that the family had decided not to allow the pt to be discharged.  Pt's DIL Vanetta Shawl who stated she and the family would not allow discharge at this time.  CSW staffed case with [facility] Director, called CM and will update CM once CSW's call is returned.  Family asked if any other bed offers have been sent from SNF's.  CSW will wait until CM returns call.  Alphonse Guild. Jyllian Haynie, Latanya Presser, LCAS Clinical Social Worker Ph: [phone]

## 2016-07-14 ENCOUNTER — Encounter: Payer: Self-pay | Admitting: Internal Medicine

## 2016-07-14 ENCOUNTER — Non-Acute Institutional Stay (SKILLED_NURSING_FACILITY): Payer: Medicare Other | Admitting: Internal Medicine

## 2016-07-14 DIAGNOSIS — I10 Essential (primary) hypertension: Secondary | ICD-10-CM

## 2016-07-14 DIAGNOSIS — R488 Other symbolic dysfunctions: Secondary | ICD-10-CM | POA: Diagnosis not present

## 2016-07-14 DIAGNOSIS — E1169 Type 2 diabetes mellitus with other specified complication: Secondary | ICD-10-CM | POA: Diagnosis not present

## 2016-07-14 DIAGNOSIS — E785 Hyperlipidemia, unspecified: Secondary | ICD-10-CM

## 2016-07-14 DIAGNOSIS — E876 Hypokalemia: Secondary | ICD-10-CM

## 2016-07-14 DIAGNOSIS — S79911A Unspecified injury of right hip, initial encounter: Secondary | ICD-10-CM | POA: Diagnosis not present

## 2016-07-14 DIAGNOSIS — M6281 Muscle weakness (generalized): Secondary | ICD-10-CM | POA: Diagnosis not present

## 2016-07-14 DIAGNOSIS — I4819 Other persistent atrial fibrillation: Secondary | ICD-10-CM

## 2016-07-14 DIAGNOSIS — I481 Persistent atrial fibrillation: Secondary | ICD-10-CM

## 2016-07-14 DIAGNOSIS — Z79899 Other long term (current) drug therapy: Secondary | ICD-10-CM | POA: Diagnosis not present

## 2016-07-14 DIAGNOSIS — K5901 Slow transit constipation: Secondary | ICD-10-CM

## 2016-07-14 DIAGNOSIS — S72009A Fracture of unspecified part of neck of unspecified femur, initial encounter for closed fracture: Secondary | ICD-10-CM | POA: Diagnosis not present

## 2016-07-14 DIAGNOSIS — S32401S Unspecified fracture of right acetabulum, sequela: Secondary | ICD-10-CM

## 2016-07-14 DIAGNOSIS — F0391 Unspecified dementia with behavioral disturbance: Secondary | ICD-10-CM

## 2016-07-14 DIAGNOSIS — R2681 Unsteadiness on feet: Secondary | ICD-10-CM | POA: Diagnosis not present

## 2016-07-14 LAB — FOLATE RBC
FOLATE, HEMOLYSATE: 378.6 ng/mL
Folate, RBC: 1097 ng/mL (ref >498)
HEMATOCRIT: 34.5 % (ref 34.0–46.6)

## 2016-07-14 NOTE — Progress Notes (Signed)
LOCATION: [facility]  PCP: Aretta Nip, MD   Code Status: Full Code  Goals of care: Advanced Directive information Advanced Directives 07/11/2016  Does Patient Have a Medical Advance Directive? Yes  Type of Paramedic of [place];Living will  Does patient want to make changes to medical advance directive? No - Patient declined  Copy of [place] in Chart? No - copy requested  Would patient like information on creating a medical advance directive? No - Patient declined  Pre-existing out of facility DNR order (yellow form or pink MOST form) -       Extended Emergency Contact Information Primary Emergency Contact: Guadlupe Spanish, [place] Phone: [phone] Relation: Son Secondary Emergency Contact: Elisha Ponder, [place] Phone: [phone] Mobile Phone: [phone] Relation: Other   Allergies  Allergen Reactions  . Codeine Nausea And Vomiting  . Lortab [Hydrocodone-Acetaminophen] Nausea And Vomiting    Chief Complaint  Patient presents with  . New Admit To SNF    New Admission Visit      HPI:  Patient is a 83 y.o. female seen today for short term rehabilitation post hospital admission from 07/11/2016-07/13/2016 post fall with right acetabular fracture and inferior pubic ramus fracture. She was seen by orthopedic and noncooperative management was recommended. She was also found to have right inguinal hematoma with some blood loss anemia. She is nonweightbearing to right lower extremity and is on pain medication. She is seen in her room today. She has medical history of hypertension, hyperlipidemia, diabetes ascites, atrial fibrillation among others.  Review of Systems:  Constitutional: Negative for fever, chills, diaphoresis. Feels weak and tired. HENT: Negative for headache, congestion, nasal discharge, sore throat,  difficulty swallowing.   Eyes: Negative for double vision and discharge.  Respiratory: Negative for shortness of breath and wheezing. Positive for dry cough.   Cardiovascular: Negative for chest pain, palpitations, leg swelling.  Gastrointestinal: Negative for heartburn, nausea, vomiting, abdominal pain, loss of appetite. Last bowel movement was 3 days ago. Genitourinary: Negative for dysuria. Positive for increased urinary frequency. Musculoskeletal: Negative for back pain, fall in the facility.  complains of pain to her right leg. Skin: Negative for itching, rash.  Neurological: Positive for occasional dizziness. Psychiatric/Behavioral: Negative for depression.   Past Medical History:  Diagnosis Date  . Atrial fibrillation ([place])    s/p DCCV 07/2012  . Benign diastolic hypertension   . Chronic anticoagulation   . Chronic diastolic CHF (congestive heart failure) (H[facility])    EF now 65% by echo 10/2012 with grade II diasotlid dysfunction  . Diabetes mellitus without complication ([place])   . Dilated cardiomyopathy secondary to tachycardia ([place]) 07/2012   2D echo 10/2012 showed EF 65% with grade II diastolic dysfunction  . Elevated cholesterol   . Encounter for long-term (current) use of other medications   . Hyperlipidemia   . Hypertension   . Liver cyst    Past Surgical History:  Procedure Laterality Date  . BACK SURGERY     herniated disc  . CARDIOVERSION N/A 08/09/2012   Procedure: CARDIOVERSION;  Surgeon: Sueanne Margarita, MD;  Location: [facility];  Service: Cardiovascular;  Laterality: N/A;  . CARDIOVERSION N/A 09/28/2014   Procedure: CARDIOVERSION;  Surgeon: Sueanne Margarita, MD;  Location: [facility] ENDOSCOPY;  Service: Cardiovascular;  Laterality: N/A;  . TEE WITHOUT CARDIOVERSION  N/A 08/09/2012   Procedure: TRANSESOPHAGEAL ECHOCARDIOGRAM (TEE);  Surgeon: Sueanne Margarita, MD;  Location: [facility] ENDOSCOPY;  Service: Cardiovascular;  Laterality: N/A;  talk to angie in [place].   Social History:    reports that she has quit smoking. She has never used smokeless tobacco. She reports that she drinks alcohol. She reports that she does not use drugs.  Family History  Problem Relation Age of Onset  . Heart disease Mother   . CAD Mother   . Heart disease Father   . CAD Father   . Pancreatic cancer Brother     Medications: Allergies as of 07/14/2016      Reactions   Codeine Nausea And Vomiting   Lortab [hydrocodone-acetaminophen] Nausea And Vomiting      Medication List       Accurate as of 07/14/16  3:37 PM. Always use your most recent med list.          amiodarone 200 MG tablet Commonly known as:  PACERONE TAKE 1/2 TABLET (100 MG TOTAL) BY MOUTH DAILY.   amLODipine 5 MG tablet Commonly known as:  NORVASC Take 1 tablet (5 mg total) by mouth daily.   CRESTOR 5 MG tablet Generic drug:  rosuvastatin Take 5 mg by mouth at bedtime.   docusate sodium 100 MG capsule Commonly known as:  COLACE Take 200 mg by mouth at bedtime.   ELIQUIS 2.5 MG Tabs tablet Generic drug:  apixaban TAKE 1 TABLET (2.5 MG TOTAL) BY MOUTH 2 (TWO) TIMES DAILY.   furosemide 20 MG tablet Commonly known as:  LASIX Take 20 mg by mouth daily as needed for edema.   lisinopril 40 MG tablet Commonly known as:  PRINIVIL,ZESTRIL TAKE 1 TABLET (40 MG TOTAL) BY MOUTH DAILY.   oxyCODONE 5 MG immediate release tablet Commonly known as:  Oxy IR/ROXICODONE Take 1 tablet (5 mg total) by mouth every 4 (four) hours as needed for moderate pain or severe pain.   potassium chloride SA 20 MEQ tablet Commonly known as:  KLOR-CON M20 Take 2 tablets (40 mEq total) by mouth every other day.   senna 8.6 MG Tabs tablet Commonly known as:  SENOKOT Take 2 tablets (17.2 mg total) by mouth daily.   sitaGLIPtin 100 MG tablet Commonly known as:  JANUVIA Take 100 mg by mouth daily.   SYSTANE 0.4-0.3 % Soln Generic drug:  Polyethyl Glycol-Propyl Glycol Place 1-2 drops into both eyes 4 (four) times daily as needed  (for dry eyes).       Immunizations:  There is no immunization history on file for this patient.   Physical Exam: Vitals:   07/14/16 1531  BP: (!) 172/88  Pulse: 80  Temp: 97.7 F (36.5 C)  TempSrc: Oral  SpO2: 97%  Weight: 130 lb 1.6 oz (59 kg)  Height: 5\' 5"  (1.651 m)   Body mass index is 21.65 kg/m.  General- elderly female, Frail and thin built, in no acute distress Head- normocephalic, atraumatic Nose- no maxillary or frontal sinus tenderness, no nasal discharge Throat- moist mucus membrane, normal oropharynx  Eyes- PERRLA, EOMI, no pallor, no icterus, no discharge, normal conjunctiva, normal sclera Neck- no cervical lymphadenopathy Cardiovascular- irregular heart rate, no murmur Respiratory- bilateral clear to auscultation, no wheeze, no rhonchi, no crackles, no use of accessory muscles Abdomen- bowel sounds present, soft, non tender, no guarding or rigidity Musculoskeletal- able to move all 4 extremities, pain with movement of right leg, no leg edema Neurological- alert and oriented to person and place  only, not oriented to time Skin- warm and dry Psychiatry- appears anxious    Labs reviewed: Basic Metabolic Panel:  Recent Labs  09/05/15 1010 07/11/16 1847 07/12/16 0609  NA 142 141 141  K 4.1 3.8 3.8  CL 106 107 109  CO2 23 26 26   GLUCOSE 135* 171* 140*  BUN 16 19 17   CREATININE 0.99* 0.90 0.80  CALCIUM 8.8 8.9 8.7*   Liver Function Tests:  Recent Labs  09/05/15 1010 07/11/16 1847  AST 17 35  ALT 17 40  ALKPHOS 50 79  BILITOT 0.5 0.6  PROT 6.4 6.9  ALBUMIN 4.0 3.9   No results for input(s): LIPASE, AMYLASE in the last 8760 hours. No results for input(s): AMMONIA in the last 8760 hours. CBC:  Recent Labs  09/05/15 1010 07/11/16 1847 07/12/16 0609 07/13/16 0610  WBC 5.7 13.9* 7.5 10.1  NEUTROABS 3,819  --   --   --   HGB 12.6 12.2 10.9* 13.3  HCT 38.2 38.0 33.4* 38.7  MCV 98.7 103.5* 99.7 101.6*  PLT 192 166 147* 179    Cardiac Enzymes: No results for input(s): CKTOTAL, CKMB, CKMBINDEX, TROPONINI in the last 8760 hours. BNP: Invalid input(s): POCBNP CBG:  Recent Labs  07/13/16 0746 07/13/16 1211 07/13/16 1654  GLUCAP 135* 200* 130*    Radiological Exams: Ct Hip Right Wo Contrast  Result Date: 07/11/2016 CLINICAL DATA:  Fall landing on the right hip. Right hip pain. Acetabular fracture on conventional radiographs. EXAM: CT OF THE RIGHT HIP WITHOUT CONTRAST TECHNIQUE: Multidetector CT imaging of the right hip was performed according to the standard protocol. Multiplanar CT image reconstructions were also generated. COMPARISON:  07/11/2016 FINDINGS: Bones/Joint/Cartilage Anterior column acetabular fracture variant noted with involvement of the anterior inferior acetabular wall, and also with a fracture of the inferior pubic ramus. This fracture does not extend up into the iliac bone is best I can see. Today's CT imaging was performed only of the right hip cannot of the entire bony pelvis which precludes the ability to assess for other pelvic fractures. There is some indistinctness of the soft tissues anterior to the right piriformis muscle which could indicate mild hematoma or conceivably an occult right sacral fracture. Ligaments Suboptimally assessed by CT. Muscles and Tendons Unremarkable Soft tissues Some mild hematoma tracking in the right inguinal region. IMPRESSION: 1. Anterior column acetabular fracture variant, with involvement of the inferior pubic ramus and the anterior inferior acetabular wall but not extending up into the iliac bone. 2. Mild hematoma overlying the right inguinal region. There is some subtle indistinctness of tissue planes approaching the sciatic notch, which could indicate an otherwise occult sacral fracture. Today's CT exam only included the right hip and not the entire bony pelvis. Electronically Signed   By: Van Clines M.D.   On: 07/11/2016 19:49   Dg Hip Unilat W Or Wo  Pelvis 2-3 Views Right  Result Date: 07/11/2016 CLINICAL DATA:  Fall landing on the right hip.  Right hip pain. EXAM: DG HIP (WITH OR WITHOUT PELVIS) 2-3V RIGHT COMPARISON:  None. FINDINGS: There is an acute acetabular fracture on the right with displaced disruption of the iliopubic line. Reduced intervertebral disc height at L5-S1. Bony demineralization. Questionable inferior pubic ramus fracture adjacent to the pubic body. IMPRESSION: 1. Acute right acetabular fracture. Possible right inferior pubic ramus fracture. Consider CT of the bony pelvis for further characterization. Electronically Signed   By: Van Clines M.D.   On: 07/11/2016 18:12    Assessment/Plan  Unsteady gait Will have patient work with PT/OT as tolerated to regain strength and restore function.  Fall precautions are in place.  Right acetabular fracture Currently on oxycodone 5 mg every 4 hours as needed for pain and nonweightbearing to right lower extremity. Get PMR consult for further pain management. Fall precautions to be taken.  Atrial fibrillation Controlled heart rate. Continue eliquis 2.5 mg twice a day for stroke prophylaxis. Continue amiodarone 100 mg daily.  Hypertension Elevated blood pressure reading. Her pain could be contribution some. Currently on amlodipine 5 mg daily with lisinopril 40 mg daily and Lasix 20 mg daily as needed. Monitor blood pressure reading every 6 and adjust blood pressure medicine if needed.  Dementia with behavior disturbance Has anxiety. Supportive care for now and psychiatry consult  Hypokalemia Continue potassium supplement and monitor BMP  Constipation Currently on Senokot 2 tablets daily and Colace 200 mg daily. Her limited mobility and being on pain medication could have worsened this. Add MiraLAX daily for now and prune juice with breakfast. Encouraged hydration. Monitor bowel movement.  Type 2 diabetes mellitus Monitor blood sugar reading. Continue sitagliptin 100 mg  daily, Crestor 5 mg daily. Check hemoglobin A1c. No results found for: HGBA1C    Goals of care: short term rehabilitation   Labs/tests ordered: cbc, bmp, a1c 07/17/16  Family/ staff Communication: reviewed care plan with patient and nursing supervisor  I have spent greater than 50 minutes for this encounter which includes reviewing hospital records, addressing above mentioned concerns, reviewing care plan with patient, answering patient's concerns and counseling her.    Blanchie Serve, MD Internal Medicine [facility] [address] [place], [place] [address] Cell Phone (Monday-Friday 8 am - 5 pm): [phone] On Call: [phone] and follow prompts after 5 pm and on weekends Office Phone: [phone] Office Fax: [phone]

## 2016-07-14 NOTE — Progress Notes (Signed)
[facility] arrived to transport pt to [facility] Pt belongings sent with pt. Pt is alert and reoriented about going to [place] and is pleasant and talkative to [facility] staff. Pt transferred into [facility] stretcher, [place] staff secured and transported pt off unit.

## 2016-07-15 DIAGNOSIS — M6281 Muscle weakness (generalized): Secondary | ICD-10-CM | POA: Diagnosis not present

## 2016-07-15 DIAGNOSIS — R488 Other symbolic dysfunctions: Secondary | ICD-10-CM | POA: Diagnosis not present

## 2016-07-15 LAB — URINE CULTURE: Culture: >=100000 — AB

## 2016-07-16 DIAGNOSIS — R2681 Unsteadiness on feet: Secondary | ICD-10-CM | POA: Diagnosis not present

## 2016-07-16 DIAGNOSIS — S329XXA Fracture of unspecified parts of lumbosacral spine and pelvis, initial encounter for closed fracture: Secondary | ICD-10-CM | POA: Diagnosis not present

## 2016-07-16 DIAGNOSIS — R488 Other symbolic dysfunctions: Secondary | ICD-10-CM | POA: Diagnosis not present

## 2016-07-16 DIAGNOSIS — M6281 Muscle weakness (generalized): Secondary | ICD-10-CM | POA: Diagnosis not present

## 2016-07-16 DIAGNOSIS — M25551 Pain in right hip: Secondary | ICD-10-CM | POA: Diagnosis not present

## 2016-07-17 DIAGNOSIS — Z79899 Other long term (current) drug therapy: Secondary | ICD-10-CM | POA: Diagnosis not present

## 2016-07-17 DIAGNOSIS — M6281 Muscle weakness (generalized): Secondary | ICD-10-CM | POA: Diagnosis not present

## 2016-07-17 DIAGNOSIS — R488 Other symbolic dysfunctions: Secondary | ICD-10-CM | POA: Diagnosis not present

## 2016-07-17 LAB — CBC AND DIFFERENTIAL
HCT: 36 % (ref 36–46)
Hemoglobin: 11.6 g/dL — AB (ref 12.0–16.0)
Platelets: 201 10*3/uL (ref 150–399)
WBC: 6.5 10*3/mL

## 2016-07-17 LAB — BASIC METABOLIC PANEL
BUN: 18 mg/dL (ref 4–21)
CREATININE: 0.8 mg/dL (ref 0.5–1.1)
Glucose: 155 mg/dL
POTASSIUM: 4.3 mmol/L (ref 3.4–5.3)
Sodium: 142 mmol/L (ref 137–147)

## 2016-07-18 DIAGNOSIS — R488 Other symbolic dysfunctions: Secondary | ICD-10-CM | POA: Diagnosis not present

## 2016-07-18 DIAGNOSIS — M25551 Pain in right hip: Secondary | ICD-10-CM | POA: Diagnosis not present

## 2016-07-18 DIAGNOSIS — R2681 Unsteadiness on feet: Secondary | ICD-10-CM | POA: Diagnosis not present

## 2016-07-18 DIAGNOSIS — S329XXA Fracture of unspecified parts of lumbosacral spine and pelvis, initial encounter for closed fracture: Secondary | ICD-10-CM | POA: Diagnosis not present

## 2016-07-18 DIAGNOSIS — M6281 Muscle weakness (generalized): Secondary | ICD-10-CM | POA: Diagnosis not present

## 2016-07-19 DIAGNOSIS — R488 Other symbolic dysfunctions: Secondary | ICD-10-CM | POA: Diagnosis not present

## 2016-07-19 DIAGNOSIS — M6281 Muscle weakness (generalized): Secondary | ICD-10-CM | POA: Diagnosis not present

## 2016-07-21 ENCOUNTER — Ambulatory Visit (INDEPENDENT_AMBULATORY_CARE_PROVIDER_SITE_OTHER): Payer: Medicare Other

## 2016-07-21 ENCOUNTER — Encounter (INDEPENDENT_AMBULATORY_CARE_PROVIDER_SITE_OTHER): Payer: Self-pay | Admitting: Orthopaedic Surgery

## 2016-07-21 ENCOUNTER — Ambulatory Visit (INDEPENDENT_AMBULATORY_CARE_PROVIDER_SITE_OTHER): Payer: Medicare Other | Admitting: Orthopaedic Surgery

## 2016-07-21 DIAGNOSIS — M25551 Pain in right hip: Secondary | ICD-10-CM | POA: Diagnosis not present

## 2016-07-21 DIAGNOSIS — S32431D Displaced fracture of anterior column [iliopubic] of right acetabulum, subsequent encounter for fracture with routine healing: Secondary | ICD-10-CM

## 2016-07-21 DIAGNOSIS — S329XXA Fracture of unspecified parts of lumbosacral spine and pelvis, initial encounter for closed fracture: Secondary | ICD-10-CM | POA: Diagnosis not present

## 2016-07-21 DIAGNOSIS — R488 Other symbolic dysfunctions: Secondary | ICD-10-CM | POA: Diagnosis not present

## 2016-07-21 DIAGNOSIS — R2681 Unsteadiness on feet: Secondary | ICD-10-CM | POA: Diagnosis not present

## 2016-07-21 DIAGNOSIS — M6281 Muscle weakness (generalized): Secondary | ICD-10-CM | POA: Diagnosis not present

## 2016-07-21 NOTE — Progress Notes (Signed)
Office Visit Note   Patient: Cynthia Wright           Date of Birth: December 31, 1924           MRN: [id number] Visit Date: 07/21/2016              Requested by: Aretta Nip, MD [place],  [id number] PCP: Aretta Nip, MD   Assessment & Plan: Visit Diagnoses:  1. Closed displaced fracture of anterior column of right acetabulum with routine healing, subsequent encounter     Plan: X-ray show stable appearance of the displaced anterior column acetabular fracture. We will plan on treating this nonoperatively. I would like to keep her nonweightbearing for 3 more weeks and then advance 25% partial weightbearing. I'll see her back in 5 weeks with repeat Judet views of her pelvis..  Follow-Up Instructions: Return in about 5 weeks (around 08/25/2016).   Orders:  Orders Placed This Encounter  Procedures  . XR Pelvis 1-2 Views   No orders of the defined types were placed in this encounter.     Procedures: No procedures performed   Clinical Data: No additional findings.   Subjective: Chief Complaint  Patient presents with  . Right Hip - Pain    Patient is a very pleasant 83 year old female who sustained a mechanical fall about 10 days ago and had a right acetabular fracture with no intra-articular disruption. She's been treated nonoperatively. She is currently at a skilled nursing facility. She is feeling better and mainly ambulating in a wheelchair.    Review of Systems  Constitutional: Negative.   HENT: Negative.   Eyes: Negative.   Respiratory: Negative.   Cardiovascular: Negative.   Endocrine: Negative.   Musculoskeletal: Negative.   Neurological: Negative.   Hematological: Negative.   Psychiatric/Behavioral: Negative.   All other systems reviewed and are negative.    Objective: Vital Signs: There were no vitals taken for this visit.  Physical Exam  Constitutional: She is oriented to person, place, and time. She appears  well-developed and well-nourished.  HENT:  Head: Normocephalic and atraumatic.  Eyes: EOM are normal.  Neck: Neck supple.  Pulmonary/Chest: Effort normal.  Abdominal: Soft.  Neurological: She is alert and oriented to person, place, and time.  Skin: Skin is warm. Capillary refill takes less than 2 seconds.  Psychiatric: She has a normal mood and affect. Her behavior is normal. Judgment and thought content normal.  Nursing note and vitals reviewed.   Ortho Exam Right hip exam shows mild pain with rotation. Weightbearing not tested. Specialty Comments:  No specialty comments available.  Imaging: Xr Pelvis 1-2 Views  Result Date: 07/21/2016 Stable appearance of right anterior column acetabular fracture    PMFS History: Patient Active Problem List   Diagnosis Date Noted  . Fall 07/12/2016  . Leukocytosis 07/12/2016  . Type 2 diabetes mellitus with hyperlipidemia ([place]) 07/12/2016  . History of atrial fibrillation 07/12/2016  . Acetabular fracture ([place]) 07/11/2016  . Dilated cardiomyopathy secondary to tachycardia ([place]) 12/14/2013  . Syncope 06/14/2013  . Bradycardia 05/11/2013  . Fatigue 05/11/2013  . Chronic diastolic CHF (congestive heart failure) ([place]) 01/06/2013  . Mitral regurgitation 08/06/2012  . Persistent atrial fibrillation ([place]) 08/02/2012  . Essential hypertension, benign 08/02/2012  . Pure hypercholesterolemia 08/02/2012  . Liver cyst 08/02/2012   Past Medical History:  Diagnosis Date  . Atrial fibrillation ([place])    s/p DCCV 07/2012  . Benign diastolic hypertension   . Chronic anticoagulation   .  Chronic diastolic CHF (congestive heart failure) ([facility])    EF now 65% by echo 10/2012 with grade II diasotlid dysfunction  . Diabetes mellitus without complication ([place])   . Dilated cardiomyopathy secondary to tachycardia ([place]) 07/2012   2D echo 10/2012 showed EF 65% with grade II diastolic dysfunction  . Elevated cholesterol   . Encounter for long-term (current) use  of other medications   . Hyperlipidemia   . Hypertension   . Liver cyst     Family History  Problem Relation Age of Onset  . Heart disease Mother   . CAD Mother   . Heart disease Father   . CAD Father   . Pancreatic cancer Brother     Past Surgical History:  Procedure Laterality Date  . BACK SURGERY     herniated disc  . CARDIOVERSION N/A 08/09/2012   Procedure: CARDIOVERSION;  Surgeon: Sueanne Margarita, MD;  Location: [facility];  Service: Cardiovascular;  Laterality: N/A;  . CARDIOVERSION N/A 09/28/2014   Procedure: CARDIOVERSION;  Surgeon: Sueanne Margarita, MD;  Location: [facility] ENDOSCOPY;  Service: Cardiovascular;  Laterality: N/A;  . TEE WITHOUT CARDIOVERSION N/A 08/09/2012   Procedure: TRANSESOPHAGEAL ECHOCARDIOGRAM (TEE);  Surgeon: Sueanne Margarita, MD;  Location: [facility] ENDOSCOPY;  Service: Cardiovascular;  Laterality: N/A;  talk to angie in [place].   Social History   Occupational History  . Not on file.   Social History Main Topics  . Smoking status: Former Research scientist (life sciences)  . Smokeless tobacco: Never Used  . Alcohol use 0.0 oz/week    1 - 2 Glasses of wine per week  . Drug use: No  . Sexual activity: Not on file

## 2016-07-22 DIAGNOSIS — M25551 Pain in right hip: Secondary | ICD-10-CM | POA: Diagnosis not present

## 2016-07-22 DIAGNOSIS — R2681 Unsteadiness on feet: Secondary | ICD-10-CM | POA: Diagnosis not present

## 2016-07-22 DIAGNOSIS — R488 Other symbolic dysfunctions: Secondary | ICD-10-CM | POA: Diagnosis not present

## 2016-07-22 DIAGNOSIS — S329XXA Fracture of unspecified parts of lumbosacral spine and pelvis, initial encounter for closed fracture: Secondary | ICD-10-CM | POA: Diagnosis not present

## 2016-07-22 DIAGNOSIS — M6281 Muscle weakness (generalized): Secondary | ICD-10-CM | POA: Diagnosis not present

## 2016-07-23 DIAGNOSIS — G4700 Insomnia, unspecified: Secondary | ICD-10-CM | POA: Diagnosis not present

## 2016-07-23 DIAGNOSIS — M6281 Muscle weakness (generalized): Secondary | ICD-10-CM | POA: Diagnosis not present

## 2016-07-23 DIAGNOSIS — R488 Other symbolic dysfunctions: Secondary | ICD-10-CM | POA: Diagnosis not present

## 2016-07-23 DIAGNOSIS — F39 Unspecified mood [affective] disorder: Secondary | ICD-10-CM | POA: Diagnosis not present

## 2016-07-23 DIAGNOSIS — F0151 Vascular dementia with behavioral disturbance: Secondary | ICD-10-CM | POA: Diagnosis not present

## 2016-07-24 DIAGNOSIS — S329XXA Fracture of unspecified parts of lumbosacral spine and pelvis, initial encounter for closed fracture: Secondary | ICD-10-CM | POA: Diagnosis not present

## 2016-07-24 DIAGNOSIS — M25551 Pain in right hip: Secondary | ICD-10-CM | POA: Diagnosis not present

## 2016-07-24 DIAGNOSIS — R2681 Unsteadiness on feet: Secondary | ICD-10-CM | POA: Diagnosis not present

## 2016-07-24 DIAGNOSIS — M6281 Muscle weakness (generalized): Secondary | ICD-10-CM | POA: Diagnosis not present

## 2016-07-24 DIAGNOSIS — R488 Other symbolic dysfunctions: Secondary | ICD-10-CM | POA: Diagnosis not present

## 2016-07-25 DIAGNOSIS — R488 Other symbolic dysfunctions: Secondary | ICD-10-CM | POA: Diagnosis not present

## 2016-07-25 DIAGNOSIS — M25551 Pain in right hip: Secondary | ICD-10-CM | POA: Diagnosis not present

## 2016-07-25 DIAGNOSIS — M6281 Muscle weakness (generalized): Secondary | ICD-10-CM | POA: Diagnosis not present

## 2016-07-25 DIAGNOSIS — S329XXA Fracture of unspecified parts of lumbosacral spine and pelvis, initial encounter for closed fracture: Secondary | ICD-10-CM | POA: Diagnosis not present

## 2016-07-25 DIAGNOSIS — R2681 Unsteadiness on feet: Secondary | ICD-10-CM | POA: Diagnosis not present

## 2016-07-26 DIAGNOSIS — R488 Other symbolic dysfunctions: Secondary | ICD-10-CM | POA: Diagnosis not present

## 2016-07-26 DIAGNOSIS — M6281 Muscle weakness (generalized): Secondary | ICD-10-CM | POA: Diagnosis not present

## 2016-07-28 DIAGNOSIS — M25551 Pain in right hip: Secondary | ICD-10-CM | POA: Diagnosis not present

## 2016-07-28 DIAGNOSIS — S329XXA Fracture of unspecified parts of lumbosacral spine and pelvis, initial encounter for closed fracture: Secondary | ICD-10-CM | POA: Diagnosis not present

## 2016-07-28 DIAGNOSIS — R2681 Unsteadiness on feet: Secondary | ICD-10-CM | POA: Diagnosis not present

## 2016-07-28 DIAGNOSIS — M6281 Muscle weakness (generalized): Secondary | ICD-10-CM | POA: Diagnosis not present

## 2016-07-28 DIAGNOSIS — R488 Other symbolic dysfunctions: Secondary | ICD-10-CM | POA: Diagnosis not present

## 2016-07-29 DIAGNOSIS — M6281 Muscle weakness (generalized): Secondary | ICD-10-CM | POA: Diagnosis not present

## 2016-07-29 DIAGNOSIS — R262 Difficulty in walking, not elsewhere classified: Secondary | ICD-10-CM | POA: Diagnosis not present

## 2016-07-29 DIAGNOSIS — R488 Other symbolic dysfunctions: Secondary | ICD-10-CM | POA: Diagnosis not present

## 2016-07-30 DIAGNOSIS — M6281 Muscle weakness (generalized): Secondary | ICD-10-CM | POA: Diagnosis not present

## 2016-07-30 DIAGNOSIS — R262 Difficulty in walking, not elsewhere classified: Secondary | ICD-10-CM | POA: Diagnosis not present

## 2016-07-30 DIAGNOSIS — R488 Other symbolic dysfunctions: Secondary | ICD-10-CM | POA: Diagnosis not present

## 2016-07-31 DIAGNOSIS — R262 Difficulty in walking, not elsewhere classified: Secondary | ICD-10-CM | POA: Diagnosis not present

## 2016-07-31 DIAGNOSIS — R488 Other symbolic dysfunctions: Secondary | ICD-10-CM | POA: Diagnosis not present

## 2016-07-31 DIAGNOSIS — S329XXA Fracture of unspecified parts of lumbosacral spine and pelvis, initial encounter for closed fracture: Secondary | ICD-10-CM | POA: Diagnosis not present

## 2016-07-31 DIAGNOSIS — M25551 Pain in right hip: Secondary | ICD-10-CM | POA: Diagnosis not present

## 2016-07-31 DIAGNOSIS — M6281 Muscle weakness (generalized): Secondary | ICD-10-CM | POA: Diagnosis not present

## 2016-07-31 DIAGNOSIS — R2681 Unsteadiness on feet: Secondary | ICD-10-CM | POA: Diagnosis not present

## 2016-08-01 DIAGNOSIS — R262 Difficulty in walking, not elsewhere classified: Secondary | ICD-10-CM | POA: Diagnosis not present

## 2016-08-01 DIAGNOSIS — M6281 Muscle weakness (generalized): Secondary | ICD-10-CM | POA: Diagnosis not present

## 2016-08-01 DIAGNOSIS — R488 Other symbolic dysfunctions: Secondary | ICD-10-CM | POA: Diagnosis not present

## 2016-08-02 DIAGNOSIS — M6281 Muscle weakness (generalized): Secondary | ICD-10-CM | POA: Diagnosis not present

## 2016-08-02 DIAGNOSIS — R262 Difficulty in walking, not elsewhere classified: Secondary | ICD-10-CM | POA: Diagnosis not present

## 2016-08-02 DIAGNOSIS — R488 Other symbolic dysfunctions: Secondary | ICD-10-CM | POA: Diagnosis not present

## 2016-08-03 DIAGNOSIS — R262 Difficulty in walking, not elsewhere classified: Secondary | ICD-10-CM | POA: Diagnosis not present

## 2016-08-03 DIAGNOSIS — R488 Other symbolic dysfunctions: Secondary | ICD-10-CM | POA: Diagnosis not present

## 2016-08-03 DIAGNOSIS — M6281 Muscle weakness (generalized): Secondary | ICD-10-CM | POA: Diagnosis not present

## 2016-08-04 DIAGNOSIS — R488 Other symbolic dysfunctions: Secondary | ICD-10-CM | POA: Diagnosis not present

## 2016-08-04 DIAGNOSIS — R262 Difficulty in walking, not elsewhere classified: Secondary | ICD-10-CM | POA: Diagnosis not present

## 2016-08-04 DIAGNOSIS — M6281 Muscle weakness (generalized): Secondary | ICD-10-CM | POA: Diagnosis not present

## 2016-08-05 DIAGNOSIS — M6281 Muscle weakness (generalized): Secondary | ICD-10-CM | POA: Diagnosis not present

## 2016-08-05 DIAGNOSIS — R488 Other symbolic dysfunctions: Secondary | ICD-10-CM | POA: Diagnosis not present

## 2016-08-05 DIAGNOSIS — R262 Difficulty in walking, not elsewhere classified: Secondary | ICD-10-CM | POA: Diagnosis not present

## 2016-08-06 ENCOUNTER — Non-Acute Institutional Stay (SKILLED_NURSING_FACILITY): Payer: Medicare Other | Admitting: Adult Health

## 2016-08-06 ENCOUNTER — Encounter: Payer: Self-pay | Admitting: Adult Health

## 2016-08-06 DIAGNOSIS — F0391 Unspecified dementia with behavioral disturbance: Secondary | ICD-10-CM

## 2016-08-06 DIAGNOSIS — I10 Essential (primary) hypertension: Secondary | ICD-10-CM

## 2016-08-06 DIAGNOSIS — I4819 Other persistent atrial fibrillation: Secondary | ICD-10-CM

## 2016-08-06 DIAGNOSIS — K5901 Slow transit constipation: Secondary | ICD-10-CM | POA: Diagnosis not present

## 2016-08-06 DIAGNOSIS — R488 Other symbolic dysfunctions: Secondary | ICD-10-CM | POA: Diagnosis not present

## 2016-08-06 DIAGNOSIS — S32401S Unspecified fracture of right acetabulum, sequela: Secondary | ICD-10-CM | POA: Diagnosis not present

## 2016-08-06 DIAGNOSIS — E785 Hyperlipidemia, unspecified: Secondary | ICD-10-CM | POA: Diagnosis not present

## 2016-08-06 DIAGNOSIS — E876 Hypokalemia: Secondary | ICD-10-CM | POA: Diagnosis not present

## 2016-08-06 DIAGNOSIS — F39 Unspecified mood [affective] disorder: Secondary | ICD-10-CM | POA: Diagnosis not present

## 2016-08-06 DIAGNOSIS — R2681 Unsteadiness on feet: Secondary | ICD-10-CM | POA: Diagnosis not present

## 2016-08-06 DIAGNOSIS — I481 Persistent atrial fibrillation: Secondary | ICD-10-CM | POA: Diagnosis not present

## 2016-08-06 DIAGNOSIS — E1169 Type 2 diabetes mellitus with other specified complication: Secondary | ICD-10-CM | POA: Diagnosis not present

## 2016-08-06 DIAGNOSIS — M6281 Muscle weakness (generalized): Secondary | ICD-10-CM | POA: Diagnosis not present

## 2016-08-06 DIAGNOSIS — R262 Difficulty in walking, not elsewhere classified: Secondary | ICD-10-CM | POA: Diagnosis not present

## 2016-08-06 DIAGNOSIS — G4700 Insomnia, unspecified: Secondary | ICD-10-CM | POA: Diagnosis not present

## 2016-08-06 NOTE — Progress Notes (Signed)
DATE:  08/06/2016   MRN:  [id number]  BIRTHDAY: [birth year]  Facility:  Nursing Home Location:  [place] Room Number: 1006-B  LEVEL OF CARE:  SNF [phone])  Contact Information    Name Relation Home Work Ethete Son [phone]     [place], [place] [phone]  [phone]       Code Status History    Date Active Date Inactive Code Status Order ID Comments User Context   07/11/2016  9:33 PM 07/14/2016  4:25 AM Full Code [id number]  Norval Morton, MD Inpatient       Chief Complaint  Patient presents with  . Medical Management of Chronic Issues    HISTORY OF PRESENT ILLNESS:  This is an 35-YO female seen for a routine visit.  She is a short-term rehabilitation resident at [facility]. She was recently started on Depakote sprinkles to stabilize mood.   She has been admitted to [facility] on 07/14/17 from [facility] admission dates 07/11/16 thru 07/13/16 post fall with right acetabular fracture and inferior pubic ramus fracture. Orthopedic was consulted and recommended none operative management. She was also found to have right inguinal hematoma with some blood loss anemia. She has PMH of hypertension, hyperlipidemia, diabetes mellitus and atrial fibrillation.    PAST MEDICAL HISTORY:  Past Medical History:  Diagnosis Date  . Atrial fibrillation ([place])    s/p DCCV 07/2012  . Benign diastolic hypertension   . Chronic anticoagulation   . Chronic diastolic CHF (congestive heart failure) ([facility])    EF now 65% by echo 10/2012 with grade II diasotlid dysfunction  . Diabetes mellitus without complication ([place])   . Dilated cardiomyopathy secondary to tachycardia ([place]) 07/2012   2D echo 10/2012 showed EF 65% with grade II diastolic dysfunction  . Elevated cholesterol   . Encounter for long-term (current) use of other medications   . Hyperlipidemia   . Hypertension   . Liver  cyst      CURRENT MEDICATIONS: Reviewed  Patient's Medications  New Prescriptions   No medications on file  Previous Medications   AMIODARONE (PACERONE) 200 MG TABLET    TAKE 1/2 TABLET (100 MG TOTAL) BY MOUTH DAILY.   AMLODIPINE (NORVASC) 5 MG TABLET    Take 1 tablet (5 mg total) by mouth daily.   DIVALPROEX (DEPAKOTE SPRINKLE) 125 MG CAPSULE    Take 250 mg by mouth at bedtime.   DOCUSATE SODIUM (COLACE) 100 MG CAPSULE    Take 200 mg by mouth at bedtime.   ELIQUIS 2.5 MG TABS TABLET    TAKE 1 TABLET (2.5 MG TOTAL) BY MOUTH 2 (TWO) TIMES DAILY.   FUROSEMIDE (LASIX) 20 MG TABLET    Take 20 mg by mouth daily as needed for edema.    LISINOPRIL (PRINIVIL,ZESTRIL) 40 MG TABLET    TAKE 1 TABLET (40 MG TOTAL) BY MOUTH DAILY.   MELATONIN 5 MG TABS    Take 1 tablet by mouth at bedtime as needed.   OXYCODONE (OXY-IR) 5 MG CAPSULE    Take 5 mg by mouth every 8 (eight) hours as needed for pain.   POLYETHYL GLYCOL-PROPYL GLYCOL (SYSTANE) 0.4-0.3 % SOLN    Place 1-2 drops into both eyes 4 (four) times daily as needed (for dry eyes).   POLYVINYL ALCOHOL (LIQUIFILM TEARS) 1.4 % OPHTHALMIC SOLUTION    Place 1-2 drops into both eyes 4 (four) times daily as needed for  dry eyes.   POTASSIUM CHLORIDE SA (KLOR-CON M20) 20 MEQ TABLET    Take 2 tablets (40 mEq total) by mouth every other day.   ROSUVASTATIN (CRESTOR) 5 MG TABLET    Take 5 mg by mouth at bedtime.    SENNA (SENOKOT) 8.6 MG TABS TABLET    Take 2 tablets (17.2 mg total) by mouth daily.   SITAGLIPTIN (JANUVIA) 100 MG TABLET    Take 100 mg by mouth daily.  Modified Medications   No medications on file  Discontinued Medications   OXYCODONE (OXY IR/ROXICODONE) 5 MG IMMEDIATE RELEASE TABLET    Take 1 tablet (5 mg total) by mouth every 4 (four) hours as needed for moderate pain or severe pain.     Allergies  Allergen Reactions  . Codeine Nausea And Vomiting  . Lortab [Hydrocodone-Acetaminophen] Nausea And Vomiting     REVIEW OF  SYSTEMS:  GENERAL: no change in appetite, no fatigue, no weight changes, no fever, chills or weakness EYES: Denies change in vision, dry eyes, eye pain, itching or discharge EARS: Denies change in hearing, ringing in ears, or earache NOSE: Denies nasal congestion or epistaxis MOUTH and THROAT: Denies oral discomfort, gingival pain or bleeding, pain from teeth or hoarseness   RESPIRATORY: no cough, SOB, DOE, wheezing, hemoptysis CARDIAC: no chest pain, edema or palpitations GI: no abdominal pain, diarrhea, constipation, heart burn, nausea or vomiting GU: Denies dysuria, frequency, hematuria, incontinence, or discharge PSYCHIATRIC: Denies feeling of depression or anxiety. No report of hallucinations, insomnia, paranoia, or agitation   PHYSICAL EXAMINATION  GENERAL APPEARANCE: Well nourished. In no acute distress. Normal body habitus SKIN:  Skin is warm and dry, has bruising on bilateral lower legs HEAD: Normal in size and contour. No evidence of trauma EYES: Lids open and close normally. No blepharitis, entropion or ectropion. PERRL. Conjunctivae are clear and sclerae are white. Lenses are without opacity EARS: Pinnae are normal. Patient hears normal voice tunes of the examiner MOUTH and THROAT: Lips are without lesions. Oral mucosa is moist and without lesions. Tongue is normal in shape, size, and color and without lesions NECK: supple, trachea midline, no neck masses, no thyroid tenderness, no thyromegaly LYMPHATICS: no LAN in the neck, no supraclavicular LAN RESPIRATORY: breathing is even & unlabored, BS CTAB CARDIAC: RRR, no murmur,no extra heart sounds, no edema GI: abdomen soft, normal BS, no masses, no tenderness, no hepatomegaly, no splenomegaly EXTREMITIES:  Able to move X 4 extremities PSYCHIATRIC: Alert to self, disoriented to time and place. Affect and behavior are appropriate    LABS/RADIOLOGY: Labs reviewed: Basic Metabolic Panel:  Recent Labs  09/05/15 1010  07/11/16 1847 07/12/16 0609 07/17/16  NA 142 141 141 142  K 4.1 3.8 3.8 4.3  CL 106 107 109  --   CO2 23 26 26   --   GLUCOSE 135* 171* 140*  --   BUN 16 19 17 18   CREATININE 0.99* 0.90 0.80 0.8  CALCIUM 8.8 8.9 8.7*  --    Liver Function Tests:  Recent Labs  09/05/15 1010 07/11/16 1847  AST 17 35  ALT 17 40  ALKPHOS 50 79  BILITOT 0.5 0.6  PROT 6.4 6.9  ALBUMIN 4.0 3.9   CBC:  Recent Labs  09/05/15 1010 07/11/16 1847 07/12/16 0609 07/13/16 0610 07/17/16  WBC 5.7 13.9* 7.5 10.1 6.5  NEUTROABS 3,819  --   --   --   --   HGB 12.6 12.2 10.9* 13.3 11.6*  HCT 38.2 38.0 33.4*  34.5 38.7 36  MCV 98.7 103.5* 99.7 101.6*  --   PLT 192 166 147* 179 201   CBG:  Recent Labs  07/13/16 0746 07/13/16 1211 07/13/16 1654  GLUCAP 135* 200* 130*      Ct Hip Right Wo Contrast  Result Date: 07/11/2016 CLINICAL DATA:  Fall landing on the right hip. Right hip pain. Acetabular fracture on conventional radiographs. EXAM: CT OF THE RIGHT HIP WITHOUT CONTRAST TECHNIQUE: Multidetector CT imaging of the right hip was performed according to the standard protocol. Multiplanar CT image reconstructions were also generated. COMPARISON:  07/11/2016 FINDINGS: Bones/Joint/Cartilage Anterior column acetabular fracture variant noted with involvement of the anterior inferior acetabular wall, and also with a fracture of the inferior pubic ramus. This fracture does not extend up into the iliac bone is best I can see. Today's CT imaging was performed only of the right hip cannot of the entire bony pelvis which precludes the ability to assess for other pelvic fractures. There is some indistinctness of the soft tissues anterior to the right piriformis muscle which could indicate mild hematoma or conceivably an occult right sacral fracture. Ligaments Suboptimally assessed by CT. Muscles and Tendons Unremarkable Soft tissues Some mild hematoma tracking in the right inguinal region. IMPRESSION: 1. Anterior  column acetabular fracture variant, with involvement of the inferior pubic ramus and the anterior inferior acetabular wall but not extending up into the iliac bone. 2. Mild hematoma overlying the right inguinal region. There is some subtle indistinctness of tissue planes approaching the sciatic notch, which could indicate an otherwise occult sacral fracture. Today's CT exam only included the right hip and not the entire bony pelvis. Electronically Signed   By: Van Clines M.D.   On: 07/11/2016 19:49   Dg Hip Unilat W Or Wo Pelvis 2-3 Views Right  Result Date: 07/11/2016 CLINICAL DATA:  Fall landing on the right hip.  Right hip pain. EXAM: DG HIP (WITH OR WITHOUT PELVIS) 2-3V RIGHT COMPARISON:  None. FINDINGS: There is an acute acetabular fracture on the right with displaced disruption of the iliopubic line. Reduced intervertebral disc height at L5-S1. Bony demineralization. Questionable inferior pubic ramus fracture adjacent to the pubic body. IMPRESSION: 1. Acute right acetabular fracture. Possible right inferior pubic ramus fracture. Consider CT of the bony pelvis for further characterization. Electronically Signed   By: Van Clines M.D.   On: 07/11/2016 18:12   Xr Pelvis 1-2 Views  Result Date: 07/21/2016 Stable appearance of right anterior column acetabular fracture   ASSESSMENT/PLAN:  Unsteady gait - continue rehabilitation with PT and OT, for therapeutic strengthening exercises; fall precautions  Right acetabular fracture - follows up with orthopedics; continue NWB; continue Eliquis 2.5 mg 1 tab by mouth twice a day for DVT prophylaxis; oxycodone 5 mg 1 tab by mouth every 8 hours when necessary for pain  Atrial fibrillation - rate controlled; continue amiodarone 100 mg 1 tab by mouth daily  Insomnia - recently started on melatonin 5 mg 1 tab by mouth daily at bedtime when necessary  Constipation - continue senna 8.6 mg 2 tabs = 17.2 mg by mouth daily, Colace 100 mg 2 capsules  = 200 mg by mouth daily at bedtime, MiraLAX 17 g by mouth daily  Hypertension - well controlled; continue lisinopril 40 mg 1 tab by mouth daily and Lasix when necessary  Diabetes mellitus, type II - continue Januvia 100 mg 1 tab by mouth daily and CBG before meals and at bedtime, podiatry consult and ophthalmology consult for  routine checks; check hemoglobin A1c  Hypokalemia - continue KCl ER 20 meq give 2 tabs = 40 meq by mouth every other day Lab Results  Component Value Date   K 4.3 07/17/2016   Hyperlipidemia - continue Crestor 5 mg 1 tab by mouth daily at bedtime  Mood disorder - recently started on Depakote 125 mg sprinkles give 2 capsules = 250 mg by mouth daily at bedtime  Dementia with behavioral disturbance - continue supportive care; fall precautions    Goals of care:  Short-term rehabilitation   Tyrek Lawhorn C. Pueblitos - NP    [employer] [phone]

## 2016-08-07 DIAGNOSIS — R262 Difficulty in walking, not elsewhere classified: Secondary | ICD-10-CM | POA: Diagnosis not present

## 2016-08-07 DIAGNOSIS — R488 Other symbolic dysfunctions: Secondary | ICD-10-CM | POA: Diagnosis not present

## 2016-08-07 DIAGNOSIS — M6281 Muscle weakness (generalized): Secondary | ICD-10-CM | POA: Diagnosis not present

## 2016-08-08 DIAGNOSIS — R262 Difficulty in walking, not elsewhere classified: Secondary | ICD-10-CM | POA: Diagnosis not present

## 2016-08-08 DIAGNOSIS — R488 Other symbolic dysfunctions: Secondary | ICD-10-CM | POA: Diagnosis not present

## 2016-08-08 DIAGNOSIS — M6281 Muscle weakness (generalized): Secondary | ICD-10-CM | POA: Diagnosis not present

## 2016-08-09 ENCOUNTER — Encounter (HOSPITAL_COMMUNITY): Payer: Self-pay | Admitting: Emergency Medicine

## 2016-08-09 ENCOUNTER — Encounter: Payer: Self-pay | Admitting: Internal Medicine

## 2016-08-09 ENCOUNTER — Emergency Department (HOSPITAL_COMMUNITY): Payer: Medicare Other

## 2016-08-09 ENCOUNTER — Emergency Department (HOSPITAL_COMMUNITY)
Admission: EM | Admit: 2016-08-09 | Discharge: 2016-08-10 | Disposition: A | Discharge status: Home / Self Care | Payer: Medicare Other | Attending: Emergency Medicine | Admitting: Emergency Medicine

## 2016-08-09 DIAGNOSIS — M79651 Pain in right thigh: Secondary | ICD-10-CM | POA: Diagnosis not present

## 2016-08-09 DIAGNOSIS — S73003A Unspecified subluxation of unspecified hip, initial encounter: Secondary | ICD-10-CM | POA: Diagnosis not present

## 2016-08-09 DIAGNOSIS — Y929 Unspecified place or not applicable: Secondary | ICD-10-CM | POA: Insufficient documentation

## 2016-08-09 DIAGNOSIS — I110 Hypertensive heart disease with heart failure: Secondary | ICD-10-CM | POA: Insufficient documentation

## 2016-08-09 DIAGNOSIS — T148XXA Other injury of unspecified body region, initial encounter: Secondary | ICD-10-CM | POA: Diagnosis not present

## 2016-08-09 DIAGNOSIS — R262 Difficulty in walking, not elsewhere classified: Secondary | ICD-10-CM | POA: Diagnosis not present

## 2016-08-09 DIAGNOSIS — S73004A Unspecified dislocation of right hip, initial encounter: Secondary | ICD-10-CM | POA: Diagnosis not present

## 2016-08-09 DIAGNOSIS — Y999 Unspecified external cause status: Secondary | ICD-10-CM | POA: Insufficient documentation

## 2016-08-09 DIAGNOSIS — W19XXXA Unspecified fall, initial encounter: Secondary | ICD-10-CM | POA: Insufficient documentation

## 2016-08-09 DIAGNOSIS — S32434A Nondisplaced fracture of anterior column [iliopubic] of right acetabulum, initial encounter for closed fracture: Secondary | ICD-10-CM

## 2016-08-09 DIAGNOSIS — I509 Heart failure, unspecified: Secondary | ICD-10-CM | POA: Diagnosis not present

## 2016-08-09 DIAGNOSIS — E119 Type 2 diabetes mellitus without complications: Secondary | ICD-10-CM | POA: Diagnosis not present

## 2016-08-09 DIAGNOSIS — M79671 Pain in right foot: Secondary | ICD-10-CM | POA: Diagnosis not present

## 2016-08-09 DIAGNOSIS — Z87891 Personal history of nicotine dependence: Secondary | ICD-10-CM | POA: Diagnosis not present

## 2016-08-09 DIAGNOSIS — G894 Chronic pain syndrome: Secondary | ICD-10-CM | POA: Diagnosis not present

## 2016-08-09 DIAGNOSIS — Z79899 Other long term (current) drug therapy: Secondary | ICD-10-CM | POA: Insufficient documentation

## 2016-08-09 DIAGNOSIS — R488 Other symbolic dysfunctions: Secondary | ICD-10-CM | POA: Diagnosis not present

## 2016-08-09 DIAGNOSIS — S79911A Unspecified injury of right hip, initial encounter: Secondary | ICD-10-CM | POA: Diagnosis not present

## 2016-08-09 DIAGNOSIS — S32511A Fracture of superior rim of right pubis, initial encounter for closed fracture: Secondary | ICD-10-CM | POA: Diagnosis not present

## 2016-08-09 DIAGNOSIS — M25571 Pain in right ankle and joints of right foot: Secondary | ICD-10-CM | POA: Diagnosis not present

## 2016-08-09 DIAGNOSIS — M6281 Muscle weakness (generalized): Secondary | ICD-10-CM | POA: Diagnosis not present

## 2016-08-09 DIAGNOSIS — M25561 Pain in right knee: Secondary | ICD-10-CM | POA: Diagnosis not present

## 2016-08-09 DIAGNOSIS — Y939 Activity, unspecified: Secondary | ICD-10-CM | POA: Insufficient documentation

## 2016-08-09 MED ORDER — ACETAMINOPHEN 500 MG PO TABS
1000.0000 mg | ORAL_TABLET | Freq: Once | ORAL | Status: AC
  Administered 2016-08-09: 1000 mg via ORAL
  Filled 2016-08-09: qty 2

## 2016-08-09 NOTE — ED Provider Notes (Signed)
[facility] DEPT Provider Note   CSN: [id number] Arrival date & time: 08/09/16  2014     History   Chief Complaint Right hip pain  HPI Cynthia Wright is a 83 y.o. female.  Patient had a fall a few weeks ago. Found to have a right-sided pelvic fracture extending into her right anterior collumn acetabulum and inferior pubic ramus. Has been following up outpatient with Dr. Erlinda Hong. Last note showed that the patient was asked to follow-up again on 08/25/16. She has been living at a rehabilitation facility. Today, she was sent here by her physical therapist as they felt that the x-ray that was performed at the facility showed possible worsening of her fracture. Patient reports she has no change in any of her symptoms and has no complaints at this time.   The history is provided by the patient.  Illness  This is a chronic problem. Episode onset: 3 weeks. The problem occurs constantly. The problem has been gradually improving. Pertinent negatives include no chest pain, no abdominal pain and no shortness of breath. She has tried acetaminophen for the symptoms.    Past Medical History:  Diagnosis Date  . Atrial fibrillation ([place])    s/p DCCV 07/2012  . Benign diastolic hypertension   . Chronic anticoagulation   . Chronic diastolic CHF (congestive heart failure) (HCC)    EF now 65% by echo 10/2012 with grade II diasotlid dysfunction  . Diabetes mellitus without complication ([place])   . Dilated cardiomyopathy secondary to tachycardia ([place]) 07/2012   2D echo 10/2012 showed EF 65% with grade II diastolic dysfunction  . Elevated cholesterol   . Encounter for long-term (current) use of other medications   . Hyperlipidemia   . Hypertension   . Liver cyst     Patient Active Problem List   Diagnosis Date Noted  . Fall 07/12/2016  . Leukocytosis 07/12/2016  . Type 2 diabetes mellitus with hyperlipidemia ([place]) 07/12/2016  . History of atrial fibrillation 07/12/2016  . Acetabular fracture ([place])  07/11/2016  . Dilated cardiomyopathy secondary to tachycardia ([place]) 12/14/2013  . Syncope 06/14/2013  . Bradycardia 05/11/2013  . Fatigue 05/11/2013  . Chronic diastolic CHF (congestive heart failure) ([place]) 01/06/2013  . Mitral regurgitation 08/06/2012  . Persistent atrial fibrillation ([place]) 08/02/2012  . Essential hypertension, benign 08/02/2012  . Pure hypercholesterolemia 08/02/2012  . Liver cyst 08/02/2012    Past Surgical History:  Procedure Laterality Date  . BACK SURGERY     herniated disc  . CARDIOVERSION N/A 08/09/2012   Procedure: CARDIOVERSION;  Surgeon: Sueanne Margarita, MD;  Location: [facility];  Service: Cardiovascular;  Laterality: N/A;  . CARDIOVERSION N/A 09/28/2014   Procedure: CARDIOVERSION;  Surgeon: Sueanne Margarita, MD;  Location: [facility] ENDOSCOPY;  Service: Cardiovascular;  Laterality: N/A;  . TEE WITHOUT CARDIOVERSION N/A 08/09/2012   Procedure: TRANSESOPHAGEAL ECHOCARDIOGRAM (TEE);  Surgeon: Sueanne Margarita, MD;  Location: [facility] ENDOSCOPY;  Service: Cardiovascular;  Laterality: N/A;  talk to angie in [place].    OB History    No data available       Home Medications    Prior to Admission medications   Medication Sig Start Date End Date Taking? Authorizing Provider  amiodarone (PACERONE) 200 MG tablet TAKE 1/2 TABLET (100 MG TOTAL) BY MOUTH DAILY. 05/05/16  Yes Turner, Eber Hong, MD  amLODipine (NORVASC) 5 MG tablet Take 1 tablet (5 mg total) by mouth daily. 06/25/16 06/20/17 Yes Turner, Eber Hong, MD  docusate sodium (COLACE) 100 MG capsule Take  200 mg by mouth 2 (two) times daily as needed for mild constipation.    Yes [provider]  ELIQUIS 2.5 MG TABS tablet TAKE 1 TABLET (2.5 MG TOTAL) BY MOUTH 2 (TWO) TIMES DAILY. 04/21/16  Yes Turner, Eber Hong, MD  furosemide (LASIX) 20 MG tablet Take 20 mg by mouth daily as needed for edema.    Yes [provider]  lisinopril (PRINIVIL,ZESTRIL) 40 MG tablet TAKE 1 TABLET (40 MG TOTAL) BY MOUTH DAILY. 04/22/16  Yes  Turner, Eber Hong, MD  Melatonin 5 MG TABS Take 1 tablet by mouth at bedtime.    Yes [provider]  oxycodone (OXY-IR) 5 MG capsule Take 5 mg by mouth every 8 (eight) hours as needed for pain.   Yes [provider]  Polyethyl Glycol-Propyl Glycol (SYSTANE) 0.4-0.3 % SOLN Place 1-2 drops into both eyes 4 (four) times daily as needed (for dry eyes).   Yes [provider]  polyvinyl alcohol (LIQUIFILM TEARS) 1.4 % ophthalmic solution Place 1-2 drops into both eyes 4 (four) times daily as needed for dry eyes.   Yes [provider]  potassium chloride SA (KLOR-CON M20) 20 MEQ tablet Take 2 tablets (40 mEq total) by mouth every other day. Patient taking differently: Take 20 mEq by mouth 2 (two) times daily.  05/23/16  Yes Turner, Eber Hong, MD  rosuvastatin (CRESTOR) 5 MG tablet Take 5 mg by mouth at bedtime.    Yes [provider]  senna (SENOKOT) 8.6 MG TABS tablet Take 2 tablets (17.2 mg total) by mouth daily. Patient taking differently: Take 1-2 tablets by mouth daily as needed for mild constipation.  07/13/16  Yes Tat, Shanon Brow, MD  sitaGLIPtin (JANUVIA) 100 MG tablet Take 100 mg by mouth daily.   Yes [provider]    Family History Family History  Problem Relation Age of Onset  . Heart disease Mother   . CAD Mother   . Heart disease Father   . CAD Father   . Pancreatic cancer Brother     Social History Social History  Substance Use Topics  . Smoking status: Former Research scientist (life sciences)  . Smokeless tobacco: Never Used  . Alcohol use 0.0 oz/week    1 - 2 Glasses of wine per week     Allergies   Codeine and Lortab [hydrocodone-acetaminophen]   Review of Systems Review of Systems  Respiratory: Negative for shortness of breath.   Cardiovascular: Negative for chest pain.  Gastrointestinal: Negative for abdominal pain.  Musculoskeletal: Negative for back pain and neck pain.       R hip pain  Skin: Negative for wound.     Physical  Exam Updated Vital Signs BP (!) 177/61   Pulse 64   Temp 97.6 F (36.4 C) (Oral)   Resp 16   Ht 5\' 6"  (1.676 m)   SpO2 98%   Physical Exam  Constitutional: She appears well-developed and well-nourished. No distress.  HENT:  Head: Normocephalic and atraumatic.  Eyes: Conjunctivae are normal.  Neck: Neck supple.  Cardiovascular: Normal rate and regular rhythm.   No murmur heard. Pulmonary/Chest: Effort normal and breath sounds normal. No respiratory distress.  Abdominal: Soft. There is no tenderness.  Musculoskeletal: She exhibits no edema.  No tenderness to palpation of the midline spine. No step-offs. Mild tenderness to palpation of the right hip. Neurovascularly intact. She is not shortened or externally rotated.  Neurological: She is alert.  Skin: Skin is warm and dry.  Psychiatric: She has  a normal mood and affect.  Nursing note and vitals reviewed.    ED Treatments / Results  Labs (all labs ordered are listed, but only abnormal results are displayed) Labs Reviewed - No data to display  EKG  EKG Interpretation None       Radiology Dg Hip Unilat W Or Wo Pelvis 2-3 Views Right  Result Date: 08/09/2016 CLINICAL DATA:  Recent right acetabular fracture. EXAM: DG HIP (WITH OR WITHOUT PELVIS) 2-3V RIGHT COMPARISON:  07/11/2016 pelvic and right hip radiographs FINDINGS: Diffuse osteopenia. Re- demonstration of nondisplaced right inferior pubic ramus fracture with slightly decreased visualization of the fracture lucency suggesting an early healing response. Re- demonstration of right superior pubic ramus fracture extending to the anterior column right acetabulum with stable 12 mm inferior displacement of the medial right superior pubic ramus fracture fragment, with mild sclerosis at the fracture site suggesting an early healing response. No new fracture. No evidence of hip dislocation. No suspicious focal osseous lesion. No pelvic diastasis. IMPRESSION: 1. Re- demonstration of  right superior pubic ramus fracture extending to the anterior column right acetabulum and of right inferior pubic ramus fracture, with probable early healing responses at the fracture sites. 2. No new fracture. 3. No evidence of hip dislocation. Electronically Signed   By: Ilona Sorrel M.D.   On: 08/09/2016 21:55    Procedures Procedures (including critical care time)  Medications Ordered in ED Medications  acetaminophen (TYLENOL) tablet 1,000 mg (1,000 mg Oral Given 08/09/16 2251)     Initial Impression / Assessment and Plan / ED Course  I have reviewed the triage vital signs and the nursing notes.  Pertinent labs & imaging results that were available during my care of the patient were reviewed by me and considered in my medical decision making (see chart for details).     Patient was sent here by her physical therapist as they obtained x-rays at the rehabilitation facility with question of worsening of the patient's fracture. The patient has no concerns or complaints here. She has some mild tenderness to her right hip that she reports has been present since she fell several weeks ago. She is neurovascularly intact. Repeated the x-ray that read demonstrates the same fracture as seen before. Shows evidence of early healing. No evidence of dislocation. After discussion with the patient and family members, we'll discharge the patient back to her rehabilitation facility. She has a follow-up appointment with Dr. Erlinda Hong on 08/25/16. Encouraged her to continue to be nonweightbearing for the time being.  Final Clinical Impressions(s) / ED Diagnoses   Final diagnoses:  Closed nondisplaced fracture of anterior column of right acetabulum, initial encounter [facility])    New Prescriptions New Prescriptions   No medications on file     Maryan Puls, MD 08/09/16 2258    Carmin Muskrat, MD 08/10/16 0010

## 2016-08-09 NOTE — ED Triage Notes (Addendum)
Per [facility]  Pt R hip and pelvic fx.  1 month ago. Pt sessions and felt like it came out of place. Sheet pelvic binder in place. 1.5 cm dislodgement via x-ray outside of the facility. Denies pain while sitting. 3/10 pain when bearing wt. Pt is on eliquis. Pt is a diabetic. Hx of CHF.  130/70 72 18

## 2016-08-11 DIAGNOSIS — R262 Difficulty in walking, not elsewhere classified: Secondary | ICD-10-CM | POA: Diagnosis not present

## 2016-08-11 DIAGNOSIS — R2681 Unsteadiness on feet: Secondary | ICD-10-CM | POA: Diagnosis not present

## 2016-08-11 DIAGNOSIS — S329XXA Fracture of unspecified parts of lumbosacral spine and pelvis, initial encounter for closed fracture: Secondary | ICD-10-CM | POA: Diagnosis not present

## 2016-08-11 DIAGNOSIS — M6281 Muscle weakness (generalized): Secondary | ICD-10-CM | POA: Diagnosis not present

## 2016-08-11 DIAGNOSIS — M25551 Pain in right hip: Secondary | ICD-10-CM | POA: Diagnosis not present

## 2016-08-11 DIAGNOSIS — R488 Other symbolic dysfunctions: Secondary | ICD-10-CM | POA: Diagnosis not present

## 2016-08-12 DIAGNOSIS — M6281 Muscle weakness (generalized): Secondary | ICD-10-CM | POA: Diagnosis not present

## 2016-08-12 DIAGNOSIS — R262 Difficulty in walking, not elsewhere classified: Secondary | ICD-10-CM | POA: Diagnosis not present

## 2016-08-12 DIAGNOSIS — R488 Other symbolic dysfunctions: Secondary | ICD-10-CM | POA: Diagnosis not present

## 2016-08-13 DIAGNOSIS — M6281 Muscle weakness (generalized): Secondary | ICD-10-CM | POA: Diagnosis not present

## 2016-08-13 DIAGNOSIS — S329XXA Fracture of unspecified parts of lumbosacral spine and pelvis, initial encounter for closed fracture: Secondary | ICD-10-CM | POA: Diagnosis not present

## 2016-08-13 DIAGNOSIS — M25551 Pain in right hip: Secondary | ICD-10-CM | POA: Diagnosis not present

## 2016-08-13 DIAGNOSIS — R262 Difficulty in walking, not elsewhere classified: Secondary | ICD-10-CM | POA: Diagnosis not present

## 2016-08-13 DIAGNOSIS — R2681 Unsteadiness on feet: Secondary | ICD-10-CM | POA: Diagnosis not present

## 2016-08-13 DIAGNOSIS — R488 Other symbolic dysfunctions: Secondary | ICD-10-CM | POA: Diagnosis not present

## 2016-08-14 DIAGNOSIS — R262 Difficulty in walking, not elsewhere classified: Secondary | ICD-10-CM | POA: Diagnosis not present

## 2016-08-14 DIAGNOSIS — R488 Other symbolic dysfunctions: Secondary | ICD-10-CM | POA: Diagnosis not present

## 2016-08-14 DIAGNOSIS — M6281 Muscle weakness (generalized): Secondary | ICD-10-CM | POA: Diagnosis not present

## 2016-08-15 DIAGNOSIS — R262 Difficulty in walking, not elsewhere classified: Secondary | ICD-10-CM | POA: Diagnosis not present

## 2016-08-15 DIAGNOSIS — R2681 Unsteadiness on feet: Secondary | ICD-10-CM | POA: Diagnosis not present

## 2016-08-15 DIAGNOSIS — M6281 Muscle weakness (generalized): Secondary | ICD-10-CM | POA: Diagnosis not present

## 2016-08-15 DIAGNOSIS — S329XXA Fracture of unspecified parts of lumbosacral spine and pelvis, initial encounter for closed fracture: Secondary | ICD-10-CM | POA: Diagnosis not present

## 2016-08-15 DIAGNOSIS — M25551 Pain in right hip: Secondary | ICD-10-CM | POA: Diagnosis not present

## 2016-08-15 DIAGNOSIS — R488 Other symbolic dysfunctions: Secondary | ICD-10-CM | POA: Diagnosis not present

## 2016-08-16 DIAGNOSIS — R488 Other symbolic dysfunctions: Secondary | ICD-10-CM | POA: Diagnosis not present

## 2016-08-16 DIAGNOSIS — M6281 Muscle weakness (generalized): Secondary | ICD-10-CM | POA: Diagnosis not present

## 2016-08-16 DIAGNOSIS — R262 Difficulty in walking, not elsewhere classified: Secondary | ICD-10-CM | POA: Diagnosis not present

## 2016-08-18 DIAGNOSIS — R262 Difficulty in walking, not elsewhere classified: Secondary | ICD-10-CM | POA: Diagnosis not present

## 2016-08-18 DIAGNOSIS — R488 Other symbolic dysfunctions: Secondary | ICD-10-CM | POA: Diagnosis not present

## 2016-08-18 DIAGNOSIS — M6281 Muscle weakness (generalized): Secondary | ICD-10-CM | POA: Diagnosis not present

## 2016-08-19 DIAGNOSIS — R488 Other symbolic dysfunctions: Secondary | ICD-10-CM | POA: Diagnosis not present

## 2016-08-19 DIAGNOSIS — M6281 Muscle weakness (generalized): Secondary | ICD-10-CM | POA: Diagnosis not present

## 2016-08-19 DIAGNOSIS — R262 Difficulty in walking, not elsewhere classified: Secondary | ICD-10-CM | POA: Diagnosis not present

## 2016-08-20 DIAGNOSIS — M25551 Pain in right hip: Secondary | ICD-10-CM | POA: Diagnosis not present

## 2016-08-20 DIAGNOSIS — S329XXA Fracture of unspecified parts of lumbosacral spine and pelvis, initial encounter for closed fracture: Secondary | ICD-10-CM | POA: Diagnosis not present

## 2016-08-20 DIAGNOSIS — R2681 Unsteadiness on feet: Secondary | ICD-10-CM | POA: Diagnosis not present

## 2016-08-20 DIAGNOSIS — R488 Other symbolic dysfunctions: Secondary | ICD-10-CM | POA: Diagnosis not present

## 2016-08-20 DIAGNOSIS — R262 Difficulty in walking, not elsewhere classified: Secondary | ICD-10-CM | POA: Diagnosis not present

## 2016-08-20 DIAGNOSIS — M6281 Muscle weakness (generalized): Secondary | ICD-10-CM | POA: Diagnosis not present

## 2016-08-21 DIAGNOSIS — R488 Other symbolic dysfunctions: Secondary | ICD-10-CM | POA: Diagnosis not present

## 2016-08-21 DIAGNOSIS — M6281 Muscle weakness (generalized): Secondary | ICD-10-CM | POA: Diagnosis not present

## 2016-08-21 DIAGNOSIS — R262 Difficulty in walking, not elsewhere classified: Secondary | ICD-10-CM | POA: Diagnosis not present

## 2016-08-22 DIAGNOSIS — R2681 Unsteadiness on feet: Secondary | ICD-10-CM | POA: Diagnosis not present

## 2016-08-22 DIAGNOSIS — M25551 Pain in right hip: Secondary | ICD-10-CM | POA: Diagnosis not present

## 2016-08-22 DIAGNOSIS — M6281 Muscle weakness (generalized): Secondary | ICD-10-CM | POA: Diagnosis not present

## 2016-08-22 DIAGNOSIS — S329XXA Fracture of unspecified parts of lumbosacral spine and pelvis, initial encounter for closed fracture: Secondary | ICD-10-CM | POA: Diagnosis not present

## 2016-08-23 DIAGNOSIS — M6281 Muscle weakness (generalized): Secondary | ICD-10-CM | POA: Diagnosis not present

## 2016-08-23 DIAGNOSIS — R488 Other symbolic dysfunctions: Secondary | ICD-10-CM | POA: Diagnosis not present

## 2016-08-23 DIAGNOSIS — R262 Difficulty in walking, not elsewhere classified: Secondary | ICD-10-CM | POA: Diagnosis not present

## 2016-08-24 DIAGNOSIS — R488 Other symbolic dysfunctions: Secondary | ICD-10-CM | POA: Diagnosis not present

## 2016-08-24 DIAGNOSIS — R262 Difficulty in walking, not elsewhere classified: Secondary | ICD-10-CM | POA: Diagnosis not present

## 2016-08-24 DIAGNOSIS — M6281 Muscle weakness (generalized): Secondary | ICD-10-CM | POA: Diagnosis not present

## 2016-08-25 DIAGNOSIS — R488 Other symbolic dysfunctions: Secondary | ICD-10-CM | POA: Diagnosis not present

## 2016-08-25 DIAGNOSIS — R262 Difficulty in walking, not elsewhere classified: Secondary | ICD-10-CM | POA: Diagnosis not present

## 2016-08-25 DIAGNOSIS — M6281 Muscle weakness (generalized): Secondary | ICD-10-CM | POA: Diagnosis not present

## 2016-08-26 ENCOUNTER — Ambulatory Visit (INDEPENDENT_AMBULATORY_CARE_PROVIDER_SITE_OTHER): Payer: Medicare Other | Admitting: Orthopaedic Surgery

## 2016-08-26 DIAGNOSIS — R262 Difficulty in walking, not elsewhere classified: Secondary | ICD-10-CM | POA: Diagnosis not present

## 2016-08-26 DIAGNOSIS — M6281 Muscle weakness (generalized): Secondary | ICD-10-CM | POA: Diagnosis not present

## 2016-08-26 DIAGNOSIS — R488 Other symbolic dysfunctions: Secondary | ICD-10-CM | POA: Diagnosis not present

## 2016-08-26 DIAGNOSIS — S32431D Displaced fracture of anterior column [iliopubic] of right acetabulum, subsequent encounter for fracture with routine healing: Secondary | ICD-10-CM

## 2016-08-26 NOTE — Progress Notes (Signed)
Patient is 6 weeks status post right acetabular fracture. She is progressing appropriately. Pain is minimal. X-rays show stable alignment of the fracture with bridging callus formation. No complications. From my standpoint she can advance to weightbearing as tolerated with physical therapy and walker. Continue with mobilization and strengthening. Follow up in 6 weeks for recheck. Needs repeat pelvis and Judet views on return.

## 2016-08-27 DIAGNOSIS — M6281 Muscle weakness (generalized): Secondary | ICD-10-CM | POA: Diagnosis not present

## 2016-08-27 DIAGNOSIS — R262 Difficulty in walking, not elsewhere classified: Secondary | ICD-10-CM | POA: Diagnosis not present

## 2016-08-27 DIAGNOSIS — R488 Other symbolic dysfunctions: Secondary | ICD-10-CM | POA: Diagnosis not present

## 2016-08-28 ENCOUNTER — Encounter: Payer: Self-pay | Admitting: Adult Health

## 2016-08-28 ENCOUNTER — Non-Acute Institutional Stay (SKILLED_NURSING_FACILITY): Payer: Medicare Other | Admitting: Adult Health

## 2016-08-28 DIAGNOSIS — R488 Other symbolic dysfunctions: Secondary | ICD-10-CM | POA: Diagnosis not present

## 2016-08-28 DIAGNOSIS — I10 Essential (primary) hypertension: Secondary | ICD-10-CM | POA: Diagnosis not present

## 2016-08-28 DIAGNOSIS — I481 Persistent atrial fibrillation: Secondary | ICD-10-CM

## 2016-08-28 DIAGNOSIS — R2681 Unsteadiness on feet: Secondary | ICD-10-CM | POA: Diagnosis not present

## 2016-08-28 DIAGNOSIS — R0602 Shortness of breath: Secondary | ICD-10-CM | POA: Diagnosis not present

## 2016-08-28 DIAGNOSIS — M25551 Pain in right hip: Secondary | ICD-10-CM | POA: Diagnosis not present

## 2016-08-28 DIAGNOSIS — I4891 Unspecified atrial fibrillation: Secondary | ICD-10-CM | POA: Diagnosis not present

## 2016-08-28 DIAGNOSIS — R262 Difficulty in walking, not elsewhere classified: Secondary | ICD-10-CM | POA: Diagnosis not present

## 2016-08-28 DIAGNOSIS — M6281 Muscle weakness (generalized): Secondary | ICD-10-CM | POA: Diagnosis not present

## 2016-08-28 DIAGNOSIS — S329XXA Fracture of unspecified parts of lumbosacral spine and pelvis, initial encounter for closed fracture: Secondary | ICD-10-CM | POA: Diagnosis not present

## 2016-08-28 DIAGNOSIS — D638 Anemia in other chronic diseases classified elsewhere: Secondary | ICD-10-CM | POA: Diagnosis not present

## 2016-08-28 DIAGNOSIS — I4819 Other persistent atrial fibrillation: Secondary | ICD-10-CM

## 2016-08-28 NOTE — Progress Notes (Signed)
DATE:  08/28/2016   MRN:  [id number]  BIRTHDAY: 10/06/1924  Facility:  Nursing Home Location:  [place] Room Number: 1006-B  LEVEL OF CARE:  [phone])  Contact Information    Name Relation Home Work [place] Son [phone]     Vidhi, Delellis [phone]  [phone]       Code Status History    Date Active Date Inactive Code Status Order ID Comments User Context   07/11/2016  9:33 PM 07/14/2016  4:25 AM Full Code [id number]  Norval Morton, MD Inpatient       Chief Complaint  Patient presents with  . Acute Visit    SOB    HISTORY OF PRESENT ILLNESS:  This is an 46-YO female seen for an acute visit due to shortness of breath. She was having therapy when she verbalized , "feeling winded." She was noted to have irregular heart rate. She has atrial fibrillation and takes Amiodarone daily. EKG done showed atrial fibrillation with RVR. No complaints of chest pains.  She verbalized not wanting to go to the hospital since she does not feel sick .    PAST MEDICAL HISTORY:  Past Medical History:  Diagnosis Date  . Atrial fibrillation ([place])    s/p DCCV 07/2012  . Benign diastolic hypertension   . Chronic anticoagulation   . Chronic diastolic CHF (congestive heart failure) (HCC)    EF now 65% by echo 10/2012 with grade II diasotlid dysfunction  . Diabetes mellitus without complication ([place])   . Dilated cardiomyopathy secondary to tachycardia ([place]) 07/2012   2D echo 10/2012 showed EF 65% with grade II diastolic dysfunction  . Elevated cholesterol   . Encounter for long-term (current) use of other medications   . Hyperlipidemia   . Hypertension   . Liver cyst      CURRENT MEDICATIONS: Reviewed  Patient's Medications  New Prescriptions   No medications on file  Previous Medications   AMIODARONE (PACERONE) 100 MG TABLET    Take 100 mg by mouth daily.   AMLODIPINE (NORVASC) 5 MG TABLET    Take 1 tablet (5 mg  total) by mouth daily.   DIVALPROEX (DEPAKOTE SPRINKLE) 125 MG CAPSULE    Take 250 mg by mouth at bedtime. Mood stabilizer   DOCUSATE SODIUM (COLACE) 100 MG CAPSULE    Take 200 mg by mouth at bedtime.    ELIQUIS 2.5 MG TABS TABLET    TAKE 1 TABLET (2.5 MG TOTAL) BY MOUTH 2 (TWO) TIMES DAILY.   FUROSEMIDE (LASIX) 20 MG TABLET    Take 20 mg by mouth daily as needed for edema.    LISINOPRIL (PRINIVIL,ZESTRIL) 40 MG TABLET    TAKE 1 TABLET (40 MG TOTAL) BY MOUTH DAILY.   MELATONIN 5 MG TABS    Take 1 tablet by mouth at bedtime.    OXYCODONE (OXY-IR) 5 MG CAPSULE    Take 5 mg by mouth every 8 (eight) hours as needed for pain.   POLYETHYLENE GLYCOL (MIRALAX / GLYCOLAX) PACKET    Take 17 g by mouth daily.   POLYVINYL ALCOHOL (LIQUIFILM TEARS) 1.4 % OPHTHALMIC SOLUTION    Place 1-2 drops into both eyes 4 (four) times daily as needed for dry eyes.   POTASSIUM CHLORIDE SA (KLOR-CON M20) 20 MEQ TABLET    Take 2 tablets (40 mEq total) by mouth every other day.   ROSUVASTATIN (CRESTOR) 5 MG TABLET    Take 5  mg by mouth at bedtime.    SENNA (SENOKOT) 8.6 MG TABS TABLET    Take 2 tablets (17.2 mg total) by mouth daily.   SITAGLIPTIN (JANUVIA) 100 MG TABLET    Take 100 mg by mouth daily.  Modified Medications   No medications on file  Discontinued Medications   AMIODARONE (PACERONE) 200 MG TABLET    TAKE 1/2 TABLET (100 MG TOTAL) BY MOUTH DAILY.   POLYETHYL GLYCOL-PROPYL GLYCOL (SYSTANE) 0.4-0.3 % SOLN    Place 1-2 drops into both eyes 4 (four) times daily as needed (for dry eyes).     Allergies  Allergen Reactions  . Codeine Nausea And Vomiting  . Lortab [Hydrocodone-Acetaminophen] Nausea And Vomiting     REVIEW OF SYSTEMS:  GENERAL: no change in appetite, no fatigue, no weight changes, no fever, chills or weakness SKIN: Denies rash, itching, wounds, ulcer sores, or nail abnormality EYES: Denies change in vision, dry eyes, eye pain, itching or discharge EARS: Denies change in hearing, ringing in  ears, or earache NOSE: Denies nasal congestion or epistaxis MOUTH and THROAT: Denies oral discomfort, gingival pain or bleeding, pain from teeth or hoarseness   RESPIRATORY: no cough, SOB, DOE, wheezing, hemoptysis CARDIAC: no chest pain, edema or palpitations GI: no abdominal pain, diarrhea, constipation, heart burn, nausea or vomiting GU: Denies dysuria, frequency, hematuria, incontinence, or discharge PSYCHIATRIC: Denies feeling of depression or anxiety. No report of hallucinations, insomnia, paranoia, or agitation    PHYSICAL EXAMINATION  GENERAL APPEARANCE: Well nourished. In no acute distress. Normal body habitus SKIN:  Skin is warm and dry.  HEAD: Normal in size and contour. No evidence of trauma EYES: Lids open and close normally. No blepharitis, entropion or ectropion. PERRL. Conjunctivae are clear and sclerae are white. Lenses are without opacity EARS: Pinnae are normal. Patient hears normal voice tunes of the examiner MOUTH and THROAT: Lips are without lesions. Oral mucosa is moist and without lesions. Tongue is normal in shape, size, and color and without lesions NECK: supple, trachea midline, no neck masses, no thyroid tenderness, no thyromegaly LYMPHATICS: no LAN in the neck, no supraclavicular LAN RESPIRATORY: breathing is even & unlabored, BS CTAB CARDIAC: irregular hear rate, no murmur,no extra heart sounds, no edema GI: abdomen soft, normal BS, no masses, no tenderness, no hepatomegaly, no splenomegaly EXTREMITIES:  Able to move X 4 extremities PSYCHIATRIC: Alert to self, disoriented to time and place. Affect and behavior are appropriate   LABS/RADIOLOGY: Labs reviewed: Basic Metabolic Panel:  Recent Labs  09/05/15 1010 07/11/16 1847 07/12/16 0609 07/17/16  NA 142 141 141 142  K 4.1 3.8 3.8 4.3  CL 106 107 109  --   CO2 23 26 26   --   GLUCOSE 135* 171* 140*  --   BUN 16 19 17 18   CREATININE 0.99* 0.90 0.80 0.8  CALCIUM 8.8 8.9 8.7*  --    Liver Function  Tests:  Recent Labs  09/05/15 1010 07/11/16 1847  AST 17 35  ALT 17 40  ALKPHOS 50 79  BILITOT 0.5 0.6  PROT 6.4 6.9  ALBUMIN 4.0 3.9   CBC:  Recent Labs  09/05/15 1010 07/11/16 1847 07/12/16 0609 07/13/16 0610 07/17/16  WBC 5.7 13.9* 7.5 10.1 6.5  NEUTROABS 3,819  --   --   --   --   HGB 12.6 12.2 10.9* 13.3 11.6*  HCT 38.2 38.0 33.4*  34.5 38.7 36  MCV 98.7 103.5* 99.7 101.6*  --   PLT 192 166 147* 179  201   CBG:  Recent Labs  07/13/16 0746 07/13/16 1211 07/13/16 1654  GLUCAP 135* 200* 130*      Dg Hip Unilat W Or Wo Pelvis 2-3 Views Right  Result Date: 08/09/2016 CLINICAL DATA:  Recent right acetabular fracture. EXAM: DG HIP (WITH OR WITHOUT PELVIS) 2-3V RIGHT COMPARISON:  07/11/2016 pelvic and right hip radiographs FINDINGS: Diffuse osteopenia. Re- demonstration of nondisplaced right inferior pubic ramus fracture with slightly decreased visualization of the fracture lucency suggesting an early healing response. Re- demonstration of right superior pubic ramus fracture extending to the anterior column right acetabulum with stable 12 mm inferior displacement of the medial right superior pubic ramus fracture fragment, with mild sclerosis at the fracture site suggesting an early healing response. No new fracture. No evidence of hip dislocation. No suspicious focal osseous lesion. No pelvic diastasis. IMPRESSION: 1. Re- demonstration of right superior pubic ramus fracture extending to the anterior column right acetabulum and of right inferior pubic ramus fracture, with probable early healing responses at the fracture sites. 2. No new fracture. 3. No evidence of hip dislocation. Electronically Signed   By: Ilona Sorrel M.D.   On: 08/09/2016 21:55    ASSESSMENT/PLAN:  Atrial fibrillation - she verbalized not wanting to go to the hospital and does not have any chest pain nor SOB; will continue Amiodarone 100 mg 1 tab PO Q D, hold for SBP< 110 and HR <60; check troponin; she  refuses to go to the hospital at this time; check vital signs Q shift X 5 days and notify provider for SOB nor chest pains  Hypertension - Continue lisinopril 40 mg 1 tab by mouth daily and hold for SBP < =110, amlodipine besylate 5 mg 1 tab by mouth daily and hold for SBP< =110 ; check BMP  Anemia of chronic disease - check CBC Lab Results  Component Value Date   HGB 11.6 (A) 07/17/2016      Monina C. [place] - NP    [employer] [phone]

## 2016-08-29 DIAGNOSIS — R262 Difficulty in walking, not elsewhere classified: Secondary | ICD-10-CM | POA: Diagnosis not present

## 2016-08-29 DIAGNOSIS — M6281 Muscle weakness (generalized): Secondary | ICD-10-CM | POA: Diagnosis not present

## 2016-08-29 DIAGNOSIS — I10 Essential (primary) hypertension: Secondary | ICD-10-CM | POA: Diagnosis not present

## 2016-08-30 DIAGNOSIS — I10 Essential (primary) hypertension: Secondary | ICD-10-CM | POA: Diagnosis not present

## 2016-08-30 DIAGNOSIS — M6281 Muscle weakness (generalized): Secondary | ICD-10-CM | POA: Diagnosis not present

## 2016-08-30 DIAGNOSIS — R262 Difficulty in walking, not elsewhere classified: Secondary | ICD-10-CM | POA: Diagnosis not present

## 2016-09-01 DIAGNOSIS — E119 Type 2 diabetes mellitus without complications: Secondary | ICD-10-CM | POA: Diagnosis not present

## 2016-09-01 DIAGNOSIS — M25551 Pain in right hip: Secondary | ICD-10-CM | POA: Diagnosis not present

## 2016-09-01 DIAGNOSIS — R2681 Unsteadiness on feet: Secondary | ICD-10-CM | POA: Diagnosis not present

## 2016-09-01 DIAGNOSIS — I10 Essential (primary) hypertension: Secondary | ICD-10-CM | POA: Diagnosis not present

## 2016-09-01 DIAGNOSIS — K5900 Constipation, unspecified: Secondary | ICD-10-CM | POA: Diagnosis not present

## 2016-09-01 DIAGNOSIS — R262 Difficulty in walking, not elsewhere classified: Secondary | ICD-10-CM | POA: Diagnosis not present

## 2016-09-01 DIAGNOSIS — S329XXA Fracture of unspecified parts of lumbosacral spine and pelvis, initial encounter for closed fracture: Secondary | ICD-10-CM | POA: Diagnosis not present

## 2016-09-01 DIAGNOSIS — M6281 Muscle weakness (generalized): Secondary | ICD-10-CM | POA: Diagnosis not present

## 2016-09-02 DIAGNOSIS — M6281 Muscle weakness (generalized): Secondary | ICD-10-CM | POA: Diagnosis not present

## 2016-09-02 DIAGNOSIS — I10 Essential (primary) hypertension: Secondary | ICD-10-CM | POA: Diagnosis not present

## 2016-09-02 DIAGNOSIS — R262 Difficulty in walking, not elsewhere classified: Secondary | ICD-10-CM | POA: Diagnosis not present

## 2016-09-03 DIAGNOSIS — M6281 Muscle weakness (generalized): Secondary | ICD-10-CM | POA: Diagnosis not present

## 2016-09-03 DIAGNOSIS — R262 Difficulty in walking, not elsewhere classified: Secondary | ICD-10-CM | POA: Diagnosis not present

## 2016-09-03 DIAGNOSIS — I10 Essential (primary) hypertension: Secondary | ICD-10-CM | POA: Diagnosis not present

## 2016-09-04 DIAGNOSIS — I10 Essential (primary) hypertension: Secondary | ICD-10-CM | POA: Diagnosis not present

## 2016-09-04 DIAGNOSIS — M6281 Muscle weakness (generalized): Secondary | ICD-10-CM | POA: Diagnosis not present

## 2016-09-04 DIAGNOSIS — R262 Difficulty in walking, not elsewhere classified: Secondary | ICD-10-CM | POA: Diagnosis not present

## 2016-09-06 DIAGNOSIS — R262 Difficulty in walking, not elsewhere classified: Secondary | ICD-10-CM | POA: Diagnosis not present

## 2016-09-06 DIAGNOSIS — I10 Essential (primary) hypertension: Secondary | ICD-10-CM | POA: Diagnosis not present

## 2016-09-06 DIAGNOSIS — M6281 Muscle weakness (generalized): Secondary | ICD-10-CM | POA: Diagnosis not present

## 2016-09-09 DIAGNOSIS — I481 Persistent atrial fibrillation: Secondary | ICD-10-CM | POA: Diagnosis not present

## 2016-09-09 DIAGNOSIS — Z9181 History of falling: Secondary | ICD-10-CM | POA: Diagnosis not present

## 2016-09-09 DIAGNOSIS — Z7984 Long term (current) use of oral hypoglycemic drugs: Secondary | ICD-10-CM | POA: Diagnosis not present

## 2016-09-09 DIAGNOSIS — E119 Type 2 diabetes mellitus without complications: Secondary | ICD-10-CM | POA: Diagnosis not present

## 2016-09-09 DIAGNOSIS — S32501D Unspecified fracture of right pubis, subsequent encounter for fracture with routine healing: Secondary | ICD-10-CM | POA: Diagnosis not present

## 2016-09-09 DIAGNOSIS — I5032 Chronic diastolic (congestive) heart failure: Secondary | ICD-10-CM | POA: Diagnosis not present

## 2016-09-09 DIAGNOSIS — I110 Hypertensive heart disease with heart failure: Secondary | ICD-10-CM | POA: Diagnosis not present

## 2016-09-09 DIAGNOSIS — F0390 Unspecified dementia without behavioral disturbance: Secondary | ICD-10-CM | POA: Diagnosis not present

## 2016-09-09 DIAGNOSIS — Z7901 Long term (current) use of anticoagulants: Secondary | ICD-10-CM | POA: Diagnosis not present

## 2016-09-09 DIAGNOSIS — I340 Nonrheumatic mitral (valve) insufficiency: Secondary | ICD-10-CM | POA: Diagnosis not present

## 2016-09-09 DIAGNOSIS — S32431D Displaced fracture of anterior column [iliopubic] of right acetabulum, subsequent encounter for fracture with routine healing: Secondary | ICD-10-CM | POA: Diagnosis not present

## 2016-09-09 DIAGNOSIS — F419 Anxiety disorder, unspecified: Secondary | ICD-10-CM | POA: Diagnosis not present

## 2016-09-10 DIAGNOSIS — S32431D Displaced fracture of anterior column [iliopubic] of right acetabulum, subsequent encounter for fracture with routine healing: Secondary | ICD-10-CM | POA: Diagnosis not present

## 2016-09-10 DIAGNOSIS — I481 Persistent atrial fibrillation: Secondary | ICD-10-CM | POA: Diagnosis not present

## 2016-09-10 DIAGNOSIS — I110 Hypertensive heart disease with heart failure: Secondary | ICD-10-CM | POA: Diagnosis not present

## 2016-09-10 DIAGNOSIS — I5032 Chronic diastolic (congestive) heart failure: Secondary | ICD-10-CM | POA: Diagnosis not present

## 2016-09-10 DIAGNOSIS — S32501D Unspecified fracture of right pubis, subsequent encounter for fracture with routine healing: Secondary | ICD-10-CM | POA: Diagnosis not present

## 2016-09-10 DIAGNOSIS — E119 Type 2 diabetes mellitus without complications: Secondary | ICD-10-CM | POA: Diagnosis not present

## 2016-09-14 ENCOUNTER — Emergency Department (HOSPITAL_COMMUNITY): Payer: Medicare Other

## 2016-09-14 ENCOUNTER — Emergency Department (HOSPITAL_COMMUNITY)
Admission: EM | Admit: 2016-09-14 | Discharge: 2016-09-14 | Disposition: A | Discharge status: Home / Self Care | Payer: Medicare Other | Attending: Emergency Medicine | Admitting: Emergency Medicine

## 2016-09-14 ENCOUNTER — Encounter (HOSPITAL_COMMUNITY): Payer: Self-pay | Admitting: Emergency Medicine

## 2016-09-14 DIAGNOSIS — Y939 Activity, unspecified: Secondary | ICD-10-CM | POA: Insufficient documentation

## 2016-09-14 DIAGNOSIS — S32511D Fracture of superior rim of right pubis, subsequent encounter for fracture with routine healing: Secondary | ICD-10-CM | POA: Diagnosis not present

## 2016-09-14 DIAGNOSIS — Y929 Unspecified place or not applicable: Secondary | ICD-10-CM | POA: Diagnosis not present

## 2016-09-14 DIAGNOSIS — Z7901 Long term (current) use of anticoagulants: Secondary | ICD-10-CM | POA: Diagnosis not present

## 2016-09-14 DIAGNOSIS — Z79899 Other long term (current) drug therapy: Secondary | ICD-10-CM | POA: Insufficient documentation

## 2016-09-14 DIAGNOSIS — S32401S Unspecified fracture of right acetabulum, sequela: Secondary | ICD-10-CM | POA: Diagnosis not present

## 2016-09-14 DIAGNOSIS — S76011A Strain of muscle, fascia and tendon of right hip, initial encounter: Secondary | ICD-10-CM | POA: Diagnosis not present

## 2016-09-14 DIAGNOSIS — I5032 Chronic diastolic (congestive) heart failure: Secondary | ICD-10-CM | POA: Insufficient documentation

## 2016-09-14 DIAGNOSIS — W19XXXA Unspecified fall, initial encounter: Secondary | ICD-10-CM | POA: Insufficient documentation

## 2016-09-14 DIAGNOSIS — S32591S Other specified fracture of right pubis, sequela: Secondary | ICD-10-CM | POA: Diagnosis not present

## 2016-09-14 DIAGNOSIS — Z87891 Personal history of nicotine dependence: Secondary | ICD-10-CM | POA: Diagnosis not present

## 2016-09-14 DIAGNOSIS — Y999 Unspecified external cause status: Secondary | ICD-10-CM | POA: Diagnosis not present

## 2016-09-14 DIAGNOSIS — S32511S Fracture of superior rim of right pubis, sequela: Secondary | ICD-10-CM | POA: Diagnosis not present

## 2016-09-14 DIAGNOSIS — I110 Hypertensive heart disease with heart failure: Secondary | ICD-10-CM | POA: Diagnosis not present

## 2016-09-14 DIAGNOSIS — E119 Type 2 diabetes mellitus without complications: Secondary | ICD-10-CM | POA: Insufficient documentation

## 2016-09-14 DIAGNOSIS — S79911A Unspecified injury of right hip, initial encounter: Secondary | ICD-10-CM | POA: Diagnosis present

## 2016-09-14 HISTORY — DX: Other amnesia: R41.3

## 2016-09-14 MED ORDER — OXYCODONE-ACETAMINOPHEN 5-325 MG PO TABS
1.0000 | ORAL_TABLET | Freq: Once | ORAL | Status: AC
  Administered 2016-09-14: 1 via ORAL
  Filled 2016-09-14: qty 1

## 2016-09-14 MED ORDER — TRAMADOL HCL 50 MG PO TABS: 50.0000 mg | ORAL_TABLET | Freq: Four times a day (QID) | ORAL | 0 refills | Status: DC | PRN

## 2016-09-14 NOTE — ED Triage Notes (Signed)
Pt reports that she fell two months ago.C/o pain in r/hip, increased over the last 24 hours. Pt stated that she uses a walker. Pt was seen by PCP after fall. Pt is poor historian.  Son at bedside. Son stated that she is seen by ortho, released from [facility] one week ago. Denies recent falls. Pt is alert, and cooperative. Requires prompting for "short term memory"

## 2016-09-14 NOTE — ED Notes (Signed)
Patient transported to X-ray 

## 2016-09-14 NOTE — ED Provider Notes (Signed)
[facility] DEPT Provider Note   CSN: [id number] Arrival date & time: 09/14/16  0957     History   Chief Complaint No chief complaint on file.   HPI Cynthia Wright is a 83 y.o. female.  Pt presents to the ED today with right sided hip pain.  She sustained an acetabular fracture on 4/13.  She has been followed by Dr. Erlinda Hong.  She has been at [facility] for rehab until 1 week ago.  She was released to [facility] on 5/29 by Dr. Erlinda Hong.  Pt said that, since being home, she has been much more active.  She has not had any additional falls.  Her son said she had physical therapy at her house as well.  The pt has not required any pain medicine for the past few weeks.  She said her right hip started aching again last night.        Past Medical History:  Diagnosis Date  . Atrial fibrillation ([place])    s/p DCCV 07/2012  . Benign diastolic hypertension   . Chronic anticoagulation   . Chronic diastolic CHF (congestive heart failure) (H[facility])    EF now 65% by echo 10/2012 with grade II diasotlid dysfunction  . Diabetes mellitus without complication ([place])   . Dilated cardiomyopathy secondary to tachycardia ([place]) 07/2012   2D echo 10/2012 showed EF 65% with grade II diastolic dysfunction  . Elevated cholesterol   . Encounter for long-term (current) use of other medications   . Hyperlipidemia   . Hypertension   . Liver cyst   . Memory loss     Patient Active Problem List   Diagnosis Date Noted  . Fall 07/12/2016  . Leukocytosis 07/12/2016  . Type 2 diabetes mellitus with hyperlipidemia ([place]) 07/12/2016  . History of atrial fibrillation 07/12/2016  . Acetabular fracture ([place]) 07/11/2016  . Dilated cardiomyopathy secondary to tachycardia ([place]) 12/14/2013  . Syncope 06/14/2013  . Bradycardia 05/11/2013  . Fatigue 05/11/2013  . Chronic diastolic CHF (congestive heart failure) ([place]) 01/06/2013  . Mitral regurgitation 08/06/2012  . Persistent atrial fibrillation ([place]) 08/02/2012  .  Essential hypertension, benign 08/02/2012  . Pure hypercholesterolemia 08/02/2012  . Liver cyst 08/02/2012    Past Surgical History:  Procedure Laterality Date  . APPENDECTOMY    . BACK SURGERY     herniated disc  . CARDIOVERSION N/A 08/09/2012   Procedure: CARDIOVERSION;  Surgeon: Sueanne Margarita, MD;  Location: [facility];  Service: Cardiovascular;  Laterality: N/A;  . CARDIOVERSION N/A 09/28/2014   Procedure: CARDIOVERSION;  Surgeon: Sueanne Margarita, MD;  Location: [facility] ENDOSCOPY;  Service: Cardiovascular;  Laterality: N/A;  . TEE WITHOUT CARDIOVERSION N/A 08/09/2012   Procedure: TRANSESOPHAGEAL ECHOCARDIOGRAM (TEE);  Surgeon: Sueanne Margarita, MD;  Location: [facility] ENDOSCOPY;  Service: Cardiovascular;  Laterality: N/A;  talk to angie in [place].  . TONSILLECTOMY      OB History    No data available       Home Medications    Prior to Admission medications   Medication Sig Start Date End Date Taking? Authorizing Provider  amiodarone (PACERONE) 100 MG tablet Take 100 mg by mouth daily.   Yes [provider]  docusate sodium (COLACE) 100 MG capsule Take 200 mg by mouth at bedtime.    Yes [provider]  ELIQUIS 2.5 MG TABS tablet TAKE 1 TABLET (2.5 MG TOTAL) BY MOUTH 2 (TWO) TIMES DAILY. 04/21/16  Yes Turner, Eber Hong, MD  furosemide (LASIX) 20 MG tablet Take  20 mg by mouth daily as needed for edema.    Yes [provider]  lisinopril (PRINIVIL,ZESTRIL) 40 MG tablet TAKE 1 TABLET (40 MG TOTAL) BY MOUTH DAILY. 04/22/16  Yes Turner, Eber Hong, MD  oxycodone (OXY-IR) 5 MG capsule Take 5 mg by mouth every 8 (eight) hours as needed for pain.   Yes [provider]  potassium chloride SA (KLOR-CON M20) 20 MEQ tablet Take 2 tablets (40 mEq total) by mouth every other day. 05/23/16  Yes Turner, Eber Hong, MD  rosuvastatin (CRESTOR) 5 MG tablet Take 5 mg by mouth at bedtime.    Yes [provider]  senna (SENOKOT) 8.6 MG TABS tablet Take 2 tablets (17.2 mg total) by  mouth daily. 07/13/16  Yes Tat, Shanon Brow, MD  sitaGLIPtin (JANUVIA) 100 MG tablet Take 100 mg by mouth daily.   Yes [provider]  amLODipine (NORVASC) 5 MG tablet Take 1 tablet (5 mg total) by mouth daily. Patient not taking: Reported on 09/14/2016 06/25/16 06/20/17  Sueanne Margarita, MD  traMADol (ULTRAM) 50 MG tablet Take 1 tablet (50 mg total) by mouth every 6 (six) hours as needed. 09/14/16   Isla Pence, MD    Family History Family History  Problem Relation Age of Onset  . Heart disease Mother   . CAD Mother   . Heart disease Father   . CAD Father   . Pancreatic cancer Brother     Social History Social History  Substance Use Topics  . Smoking status: Former Research scientist (life sciences)  . Smokeless tobacco: Never Used  . Alcohol use 0.0 oz/week    1 - 2 Glasses of wine per week     Allergies   Codeine and Lortab [hydrocodone-acetaminophen]   Review of Systems Review of Systems  Musculoskeletal:       Right hip pain  All other systems reviewed and are negative.    Physical Exam Updated Vital Signs BP (!) 155/74 (BP Location: Left Arm)   Pulse 69   Temp 98 F (36.7 C) (Oral)   Resp 18   Wt 58.1 kg (128 lb)   SpO2 99%   BMI 21.30 kg/m   Physical Exam  Constitutional: She is oriented to person, place, and time. She appears well-developed and well-nourished.  HENT:  Head: Normocephalic and atraumatic.  Right Ear: External ear normal.  Left Ear: External ear normal.  Nose: Nose normal.  Mouth/Throat: Oropharynx is clear and moist.  Eyes: Conjunctivae and EOM are normal. Pupils are equal, round, and reactive to light.  Neck: Normal range of motion. Neck supple.  Cardiovascular: Normal rate, regular rhythm, normal heart sounds and intact distal pulses.   Pulmonary/Chest: Effort normal and breath sounds normal.  Abdominal: Soft. Bowel sounds are normal.  Musculoskeletal:       Right hip: She exhibits tenderness.  Neurological: She is alert and oriented to person, place,  and time.  Skin: Skin is warm.  Psychiatric: She has a normal mood and affect. Her behavior is normal. Judgment and thought content normal.  Nursing note and vitals reviewed.    ED Treatments / Results  Labs (all labs ordered are listed, but only abnormal results are displayed) Labs Reviewed - No data to display  EKG  EKG Interpretation None       Radiology Dg Hip Unilat W Or Wo Pelvis 2-3 Views Right  Result Date: 09/14/2016 CLINICAL DATA:  Right hip pain, increasing over the last 24 hours. EXAM: DG HIP (WITH OR WITHOUT PELVIS)  2-3V RIGHT COMPARISON:  None. FINDINGS: Fracture through the right superior pubic ramus extending to the anterior column of the acetabulum, similar in the interval. Increasing callus formation at the right inferior pubic ramus fracture. The right hip is intact with no fracture or dislocation. No acute fractures are seen. IMPRESSION: Healing fractures through the superior and inferior right pubic rami. No acute fracture. Electronically Signed   By: Dorise Bullion III M.D   On: 09/14/2016 12:54    Procedures Procedures (including critical care time)  Medications Ordered in ED Medications  oxyCODONE-acetaminophen (PERCOCET/ROXICET) 5-325 MG per tablet 1 tablet (1 tablet Oral Given 09/14/16 1104)     Initial Impression / Assessment and Plan / ED Course  I have reviewed the triage vital signs and the nursing notes.  Pertinent labs & imaging results that were available during my care of the patient were reviewed by me and considered in my medical decision making (see chart for details).    Xray shows healing fractures.  I think pt has pain due to overuse from being home.  She is able to ambulate. She knows to return if worse and to f/u with Dr. Erlinda Hong.  Final Clinical Impressions(s) / ED Diagnoses   Final diagnoses:  Strain of right hip, initial encounter  Closed nondisplaced fracture of right acetabulum, unspecified portion of acetabulum, sequela     New Prescriptions New Prescriptions   TRAMADOL (ULTRAM) 50 MG TABLET    Take 1 tablet (50 mg total) by mouth every 6 (six) hours as needed.     Isla Pence, MD 09/14/16 1306

## 2016-09-15 DIAGNOSIS — S32431D Displaced fracture of anterior column [iliopubic] of right acetabulum, subsequent encounter for fracture with routine healing: Secondary | ICD-10-CM | POA: Diagnosis not present

## 2016-09-15 DIAGNOSIS — I481 Persistent atrial fibrillation: Secondary | ICD-10-CM | POA: Diagnosis not present

## 2016-09-15 DIAGNOSIS — I5032 Chronic diastolic (congestive) heart failure: Secondary | ICD-10-CM | POA: Diagnosis not present

## 2016-09-15 DIAGNOSIS — S32501D Unspecified fracture of right pubis, subsequent encounter for fracture with routine healing: Secondary | ICD-10-CM | POA: Diagnosis not present

## 2016-09-15 DIAGNOSIS — I110 Hypertensive heart disease with heart failure: Secondary | ICD-10-CM | POA: Diagnosis not present

## 2016-09-15 DIAGNOSIS — E119 Type 2 diabetes mellitus without complications: Secondary | ICD-10-CM | POA: Diagnosis not present

## 2016-09-22 DIAGNOSIS — I110 Hypertensive heart disease with heart failure: Secondary | ICD-10-CM | POA: Diagnosis not present

## 2016-09-22 DIAGNOSIS — S32501D Unspecified fracture of right pubis, subsequent encounter for fracture with routine healing: Secondary | ICD-10-CM | POA: Diagnosis not present

## 2016-09-22 DIAGNOSIS — I5032 Chronic diastolic (congestive) heart failure: Secondary | ICD-10-CM | POA: Diagnosis not present

## 2016-09-22 DIAGNOSIS — I481 Persistent atrial fibrillation: Secondary | ICD-10-CM | POA: Diagnosis not present

## 2016-09-22 DIAGNOSIS — S32431D Displaced fracture of anterior column [iliopubic] of right acetabulum, subsequent encounter for fracture with routine healing: Secondary | ICD-10-CM | POA: Diagnosis not present

## 2016-09-22 DIAGNOSIS — E119 Type 2 diabetes mellitus without complications: Secondary | ICD-10-CM | POA: Diagnosis not present

## 2016-09-25 DIAGNOSIS — S32431D Displaced fracture of anterior column [iliopubic] of right acetabulum, subsequent encounter for fracture with routine healing: Secondary | ICD-10-CM | POA: Diagnosis not present

## 2016-09-25 DIAGNOSIS — S32501D Unspecified fracture of right pubis, subsequent encounter for fracture with routine healing: Secondary | ICD-10-CM | POA: Diagnosis not present

## 2016-09-25 DIAGNOSIS — E119 Type 2 diabetes mellitus without complications: Secondary | ICD-10-CM | POA: Diagnosis not present

## 2016-09-25 DIAGNOSIS — I5032 Chronic diastolic (congestive) heart failure: Secondary | ICD-10-CM | POA: Diagnosis not present

## 2016-09-25 DIAGNOSIS — I481 Persistent atrial fibrillation: Secondary | ICD-10-CM | POA: Diagnosis not present

## 2016-09-25 DIAGNOSIS — I110 Hypertensive heart disease with heart failure: Secondary | ICD-10-CM | POA: Diagnosis not present

## 2016-09-26 ENCOUNTER — Encounter (INDEPENDENT_AMBULATORY_CARE_PROVIDER_SITE_OTHER): Payer: Self-pay | Admitting: Orthopaedic Surgery

## 2016-09-26 ENCOUNTER — Ambulatory Visit (INDEPENDENT_AMBULATORY_CARE_PROVIDER_SITE_OTHER): Payer: Medicare Other | Admitting: Orthopaedic Surgery

## 2016-09-26 ENCOUNTER — Other Ambulatory Visit (INDEPENDENT_AMBULATORY_CARE_PROVIDER_SITE_OTHER): Payer: Self-pay | Admitting: Radiology

## 2016-09-26 ENCOUNTER — Telehealth (INDEPENDENT_AMBULATORY_CARE_PROVIDER_SITE_OTHER): Payer: Self-pay | Admitting: Radiology

## 2016-09-26 DIAGNOSIS — S32431D Displaced fracture of anterior column [iliopubic] of right acetabulum, subsequent encounter for fracture with routine healing: Secondary | ICD-10-CM

## 2016-09-26 MED ORDER — OXYCODONE-ACETAMINOPHEN 5-325 MG PO TABS: 0.5000 | ORAL_TABLET | ORAL | 0 refills | Status: DC | PRN

## 2016-09-26 NOTE — Telephone Encounter (Signed)
IC son Shanon Brow and advised that [facility] will be calling him to arrange visits.

## 2016-09-26 NOTE — Progress Notes (Signed)
Patient comes in today for 2 week history of worsening right hip pain. She was discharged from her nursing home and she is very active but then has has severe pain with ambulation and weightbearing. She did go to the ER 2 weeks ago and x-rays did not show any worsening. On exam her dementia is worse. She states she has right hip pain with movement of the hip and weightbearing. Range of motion is normal. I reviewed x-rays from 2 weeks ago which does show abundant callus formation of the fracture. At this point like to get a CT scan to assess level healing and to assess for any posttraumatic arthritis that could be causing her severe pain. We will be in touch with the patient with the results.

## 2016-09-26 NOTE — Addendum Note (Signed)
Addended by: Brand Males E on: 09/26/2016 11:09 AM   Modules accepted: Orders

## 2016-09-26 NOTE — Telephone Encounter (Signed)
email [facility] and see what services they can offer this patient through [facility], and see where he Mekenzie Modeste recommend she look for in home cleaning/assist with food services.

## 2016-09-27 ENCOUNTER — Inpatient Hospital Stay (HOSPITAL_COMMUNITY): Payer: Medicare Other

## 2016-09-27 ENCOUNTER — Emergency Department (HOSPITAL_COMMUNITY)
Admission: EM | Admit: 2016-09-27 | Discharge: 2016-09-27 | Disposition: A | Discharge status: Home / Self Care | Payer: Medicare Other | Source: Home / Self Care

## 2016-09-27 ENCOUNTER — Encounter (HOSPITAL_COMMUNITY): Payer: Self-pay

## 2016-09-27 ENCOUNTER — Inpatient Hospital Stay (HOSPITAL_COMMUNITY)
Admission: EM | Admit: 2016-09-27 | Discharge: 2016-09-30 | DRG: 481 | Disposition: A | Discharge status: Skilled Nursing Facility | Payer: Medicare Other | Attending: Internal Medicine | Admitting: Internal Medicine

## 2016-09-27 ENCOUNTER — Emergency Department (HOSPITAL_COMMUNITY): Payer: Medicare Other

## 2016-09-27 DIAGNOSIS — M7989 Other specified soft tissue disorders: Secondary | ICD-10-CM

## 2016-09-27 DIAGNOSIS — S79919A Unspecified injury of unspecified hip, initial encounter: Secondary | ICD-10-CM | POA: Diagnosis not present

## 2016-09-27 DIAGNOSIS — D62 Acute posthemorrhagic anemia: Secondary | ICD-10-CM | POA: Diagnosis not present

## 2016-09-27 DIAGNOSIS — Z9181 History of falling: Secondary | ICD-10-CM | POA: Diagnosis not present

## 2016-09-27 DIAGNOSIS — E119 Type 2 diabetes mellitus without complications: Secondary | ICD-10-CM | POA: Diagnosis present

## 2016-09-27 DIAGNOSIS — Z7984 Long term (current) use of oral hypoglycemic drugs: Secondary | ICD-10-CM

## 2016-09-27 DIAGNOSIS — I4891 Unspecified atrial fibrillation: Secondary | ICD-10-CM | POA: Diagnosis not present

## 2016-09-27 DIAGNOSIS — Z419 Encounter for procedure for purposes other than remedying health state, unspecified: Secondary | ICD-10-CM

## 2016-09-27 DIAGNOSIS — I420 Dilated cardiomyopathy: Secondary | ICD-10-CM | POA: Diagnosis present

## 2016-09-27 DIAGNOSIS — B9620 Unspecified Escherichia coli [E. coli] as the cause of diseases classified elsewhere: Secondary | ICD-10-CM | POA: Diagnosis present

## 2016-09-27 DIAGNOSIS — S72011A Unspecified intracapsular fracture of right femur, initial encounter for closed fracture: Principal | ICD-10-CM | POA: Diagnosis present

## 2016-09-27 DIAGNOSIS — Y92003 Bedroom of unspecified non-institutional (private) residence as the place of occurrence of the external cause: Secondary | ICD-10-CM | POA: Diagnosis not present

## 2016-09-27 DIAGNOSIS — Z87891 Personal history of nicotine dependence: Secondary | ICD-10-CM

## 2016-09-27 DIAGNOSIS — N390 Urinary tract infection, site not specified: Secondary | ICD-10-CM | POA: Diagnosis present

## 2016-09-27 DIAGNOSIS — M25552 Pain in left hip: Secondary | ICD-10-CM | POA: Diagnosis not present

## 2016-09-27 DIAGNOSIS — S72001A Fracture of unspecified part of neck of right femur, initial encounter for closed fracture: Secondary | ICD-10-CM | POA: Diagnosis not present

## 2016-09-27 DIAGNOSIS — R413 Other amnesia: Secondary | ICD-10-CM | POA: Diagnosis present

## 2016-09-27 DIAGNOSIS — Z471 Aftercare following joint replacement surgery: Secondary | ICD-10-CM | POA: Diagnosis not present

## 2016-09-27 DIAGNOSIS — W06XXXA Fall from bed, initial encounter: Secondary | ICD-10-CM | POA: Diagnosis present

## 2016-09-27 DIAGNOSIS — I5032 Chronic diastolic (congestive) heart failure: Secondary | ICD-10-CM | POA: Diagnosis present

## 2016-09-27 DIAGNOSIS — S3993XA Unspecified injury of pelvis, initial encounter: Secondary | ICD-10-CM | POA: Diagnosis not present

## 2016-09-27 DIAGNOSIS — E876 Hypokalemia: Secondary | ICD-10-CM | POA: Diagnosis not present

## 2016-09-27 DIAGNOSIS — R4182 Altered mental status, unspecified: Secondary | ICD-10-CM | POA: Diagnosis not present

## 2016-09-27 DIAGNOSIS — F03918 Unspecified dementia, unspecified severity, with other behavioral disturbance: Secondary | ICD-10-CM

## 2016-09-27 DIAGNOSIS — I480 Paroxysmal atrial fibrillation: Secondary | ICD-10-CM | POA: Diagnosis present

## 2016-09-27 DIAGNOSIS — I110 Hypertensive heart disease with heart failure: Secondary | ICD-10-CM | POA: Diagnosis present

## 2016-09-27 DIAGNOSIS — Z79899 Other long term (current) drug therapy: Secondary | ICD-10-CM

## 2016-09-27 DIAGNOSIS — F0281 Dementia in other diseases classified elsewhere with behavioral disturbance: Secondary | ICD-10-CM | POA: Diagnosis not present

## 2016-09-27 DIAGNOSIS — K7689 Other specified diseases of liver: Secondary | ICD-10-CM | POA: Diagnosis present

## 2016-09-27 DIAGNOSIS — M6281 Muscle weakness (generalized): Secondary | ICD-10-CM | POA: Diagnosis not present

## 2016-09-27 DIAGNOSIS — R262 Difficulty in walking, not elsewhere classified: Secondary | ICD-10-CM | POA: Diagnosis not present

## 2016-09-27 DIAGNOSIS — Z96641 Presence of right artificial hip joint: Secondary | ICD-10-CM | POA: Diagnosis not present

## 2016-09-27 DIAGNOSIS — I5031 Acute diastolic (congestive) heart failure: Secondary | ICD-10-CM | POA: Diagnosis not present

## 2016-09-27 DIAGNOSIS — Z5189 Encounter for other specified aftercare: Secondary | ICD-10-CM | POA: Diagnosis not present

## 2016-09-27 DIAGNOSIS — M79609 Pain in unspecified limb: Secondary | ICD-10-CM

## 2016-09-27 DIAGNOSIS — I481 Persistent atrial fibrillation: Secondary | ICD-10-CM | POA: Diagnosis not present

## 2016-09-27 DIAGNOSIS — W19XXXA Unspecified fall, initial encounter: Secondary | ICD-10-CM

## 2016-09-27 DIAGNOSIS — Z885 Allergy status to narcotic agent status: Secondary | ICD-10-CM

## 2016-09-27 DIAGNOSIS — S79922A Unspecified injury of left thigh, initial encounter: Secondary | ICD-10-CM | POA: Diagnosis not present

## 2016-09-27 DIAGNOSIS — R52 Pain, unspecified: Secondary | ICD-10-CM | POA: Diagnosis not present

## 2016-09-27 DIAGNOSIS — S72001D Fracture of unspecified part of neck of right femur, subsequent encounter for closed fracture with routine healing: Secondary | ICD-10-CM | POA: Diagnosis not present

## 2016-09-27 DIAGNOSIS — I10 Essential (primary) hypertension: Secondary | ICD-10-CM | POA: Diagnosis not present

## 2016-09-27 DIAGNOSIS — Z7901 Long term (current) use of anticoagulants: Secondary | ICD-10-CM

## 2016-09-27 DIAGNOSIS — F05 Delirium due to known physiological condition: Secondary | ICD-10-CM | POA: Diagnosis not present

## 2016-09-27 DIAGNOSIS — D649 Anemia, unspecified: Secondary | ICD-10-CM

## 2016-09-27 DIAGNOSIS — E785 Hyperlipidemia, unspecified: Secondary | ICD-10-CM | POA: Diagnosis not present

## 2016-09-27 DIAGNOSIS — F0391 Unspecified dementia with behavioral disturbance: Secondary | ICD-10-CM | POA: Diagnosis not present

## 2016-09-27 DIAGNOSIS — S72009A Fracture of unspecified part of neck of unspecified femur, initial encounter for closed fracture: Secondary | ICD-10-CM | POA: Diagnosis not present

## 2016-09-27 DIAGNOSIS — E1159 Type 2 diabetes mellitus with other circulatory complications: Secondary | ICD-10-CM | POA: Diagnosis not present

## 2016-09-27 DIAGNOSIS — S72011D Unspecified intracapsular fracture of right femur, subsequent encounter for closed fracture with routine healing: Secondary | ICD-10-CM | POA: Diagnosis not present

## 2016-09-27 DIAGNOSIS — S8292XA Unspecified fracture of left lower leg, initial encounter for closed fracture: Secondary | ICD-10-CM | POA: Diagnosis not present

## 2016-09-27 DIAGNOSIS — W19XXXD Unspecified fall, subsequent encounter: Secondary | ICD-10-CM | POA: Diagnosis not present

## 2016-09-27 DIAGNOSIS — D508 Other iron deficiency anemias: Secondary | ICD-10-CM | POA: Diagnosis not present

## 2016-09-27 DIAGNOSIS — Z96649 Presence of unspecified artificial hip joint: Secondary | ICD-10-CM

## 2016-09-27 DIAGNOSIS — I482 Chronic atrial fibrillation: Secondary | ICD-10-CM | POA: Diagnosis not present

## 2016-09-27 DIAGNOSIS — S8990XA Unspecified injury of unspecified lower leg, initial encounter: Secondary | ICD-10-CM | POA: Diagnosis not present

## 2016-09-27 DIAGNOSIS — K5900 Constipation, unspecified: Secondary | ICD-10-CM | POA: Diagnosis not present

## 2016-09-27 DIAGNOSIS — S32591A Other specified fracture of right pubis, initial encounter for closed fracture: Secondary | ICD-10-CM | POA: Diagnosis not present

## 2016-09-27 LAB — CBC WITH DIFFERENTIAL/PLATELET
BASOS PCT: 0 %
Basophils Absolute: 0 10*3/uL (ref 0.0–0.1)
EOS ABS: 0 10*3/uL (ref 0.0–0.7)
EOS PCT: 0 %
HEMATOCRIT: 39.1 % (ref 36.0–46.0)
Hemoglobin: 13 g/dL (ref 12.0–15.0)
Lymphocytes Relative: 14 %
Lymphs Abs: 1.1 10*3/uL (ref 0.7–4.0)
MCH: 33.2 pg (ref 26.0–34.0)
MCHC: 33.2 g/dL (ref 30.0–36.0)
MCV: 100 fL (ref 78.0–100.0)
MONO ABS: 0.6 10*3/uL (ref 0.1–1.0)
MONOS PCT: 7 %
Neutro Abs: 6.4 10*3/uL (ref 1.7–7.7)
Neutrophils Relative %: 79 %
PLATELETS: 202 10*3/uL (ref 150–400)
RBC: 3.91 MIL/uL (ref 3.87–5.11)
RDW: 13.5 % (ref 11.5–15.5)
WBC: 8.1 10*3/uL (ref 4.0–10.5)

## 2016-09-27 LAB — COMPREHENSIVE METABOLIC PANEL
ALBUMIN: 3.6 g/dL (ref 3.5–5.0)
ALK PHOS: 91 U/L (ref 38–126)
ALT: 22 U/L (ref 14–54)
AST: 20 U/L (ref 15–41)
Anion gap: 8 (ref 5–15)
BILIRUBIN TOTAL: 0.7 mg/dL (ref 0.3–1.2)
BUN: 27 mg/dL — AB (ref 6–20)
CALCIUM: 9.3 mg/dL (ref 8.9–10.3)
CO2: 25 mmol/L (ref 22–32)
CREATININE: 1.15 mg/dL — AB (ref 0.44–1.00)
Chloride: 109 mmol/L (ref 101–111)
GFR calc Af Amer: 47 mL/min — ABNORMAL LOW (ref >60)
GFR calc non Af Amer: 41 mL/min — ABNORMAL LOW (ref >60)
GLUCOSE: 107 mg/dL — AB (ref 65–99)
Potassium: 4.2 mmol/L (ref 3.5–5.1)
SODIUM: 142 mmol/L (ref 135–145)
Total Protein: 6.5 g/dL (ref 6.5–8.1)

## 2016-09-27 LAB — URINALYSIS, ROUTINE W REFLEX MICROSCOPIC
BILIRUBIN URINE: NEGATIVE
Glucose, UA: NEGATIVE mg/dL
KETONES UR: 5 mg/dL — AB
Nitrite: NEGATIVE
PROTEIN: NEGATIVE mg/dL
SPECIFIC GRAVITY, URINE: 1.01 (ref 1.005–1.030)
pH: 5 (ref 5.0–8.0)

## 2016-09-27 LAB — CK: Total CK: 56 U/L (ref 38–234)

## 2016-09-27 LAB — PROTIME-INR
INR: 1.42
Prothrombin Time: 17.5 seconds — ABNORMAL HIGH (ref 11.4–15.2)

## 2016-09-27 MED ORDER — POVIDONE-IODINE 10 % EX SWAB: 2.0000 "application " | Freq: Once | CUTANEOUS | Status: DC

## 2016-09-27 MED ORDER — OXYCODONE HCL 5 MG PO TABS
5.0000 mg | ORAL_TABLET | Freq: Four times a day (QID) | ORAL | Status: DC | PRN
  Administered 2016-09-27 – 2016-09-28 (×3): 5 mg via ORAL
  Filled 2016-09-27 (×3): qty 1

## 2016-09-27 MED ORDER — SODIUM CHLORIDE 0.9 % IV SOLN
2000.0000 mg | INTRAVENOUS | Status: DC
  Filled 2016-09-27 (×2): qty 20

## 2016-09-27 MED ORDER — TRANEXAMIC ACID 1000 MG/10ML IV SOLN
1000.0000 mg | INTRAVENOUS | Status: AC
  Administered 2016-09-28: 1000 mg via INTRAVENOUS
  Filled 2016-09-27 (×2): qty 10

## 2016-09-27 MED ORDER — VITAMIN K1 10 MG/ML IJ SOLN
10.0000 mg | Freq: Once | INTRAVENOUS | Status: AC
  Administered 2016-09-27: 10 mg via INTRAVENOUS
  Filled 2016-09-27: qty 1

## 2016-09-27 MED ORDER — CEFAZOLIN SODIUM-DEXTROSE 2-4 GM/100ML-% IV SOLN
2.0000 g | INTRAVENOUS | Status: AC
  Administered 2016-09-28: 2 g via INTRAVENOUS
  Filled 2016-09-27: qty 100

## 2016-09-27 MED ORDER — CIPROFLOXACIN HCL 500 MG PO TABS
250.0000 mg | ORAL_TABLET | Freq: Two times a day (BID) | ORAL | Status: DC
  Administered 2016-09-27 – 2016-09-30 (×6): 250 mg via ORAL
  Filled 2016-09-27 (×6): qty 1

## 2016-09-27 MED ORDER — FENTANYL CITRATE (PF) 100 MCG/2ML IJ SOLN
40.0000 ug | Freq: Once | INTRAMUSCULAR | Status: AC
  Administered 2016-09-27: 40 ug via INTRAVENOUS
  Filled 2016-09-27: qty 2

## 2016-09-27 NOTE — ED Notes (Signed)
Bed: PP95 Expected date: 09/27/16 Expected time: 9:57 AM Means of arrival:  Comments: Pelvic pain

## 2016-09-27 NOTE — ED Notes (Signed)
Hospitalist at bedside 

## 2016-09-27 NOTE — ED Triage Notes (Signed)
Her family phoned EMS d/t pt. C/o pelvis area pain. They states pt. Had fx pelvis~2 months ago. Pt. Took a Percocet this morning with insufficient relief. Her family also mentioned pt. Has had some recent issues with mild disorientation. They also mentioned pt. Had "lost a daughter this year". She arrives in no distress.

## 2016-09-27 NOTE — ED Notes (Signed)
Patient requesting pain medication. Hospitalist paged for orders.

## 2016-09-27 NOTE — ED Provider Notes (Signed)
[facility] DEPT Provider Note   By signing my name below, I, Bea Graff, attest that this documentation has been prepared under the direction and in the presence of Wyn Quaker, PA-C. Electronically Signed: Bea Graff, ED Scribe. 09/27/16. 5:51 PM.    History   Chief Complaint Chief Complaint  Patient presents with  . Hip Pain   HPI  Cynthia Wright is a 82 y.o. female with PMHx of right hip fracture 2 months ago brought in by EMS who presents to the Emergency Department complaining of RLE pain (right hip, thigh and just above the knee) that worsened this morning upon waking. She states her LLE is tender in the same areas but not in the hip. She states she cannot walk now due to pain. She reports associated nausea that has since resolved but states it may have been due to being in the ambulance. Pt was seen by Dr. Erlinda Hong yesterday. She has taken Percocet for pain with minimal relief. Walking increases the pain. She denies alleviating factors. She denies any new trauma, injury or fall. She denies fever, chills, SOB, CP, dysuria, vomiting, abdominal pain, numbness, tingling or weakness of the lower extremities, bruising, wounds.  Her son arrives and reports that she did well the first week since she got out of SNF (was in for 2 months after fracture) but since then has been doing worse, complaining of more pain and increased difficulty walking.  He reports she is unable to take care of her self, is now needing a wheelchair and his wife has been staying with her.  He reports that a few weeks ago she slid out of bed and landed on her bottom.  He is unsure if it occurred before or after her visit on the 17th.  He is concerned she may have injured something in the process.    Of note he reports they took a very long car trip about 2 weeks ago to visit her bother who died.  She has been more confused since then but he attributes that to change in environment.    Past Medical  History:  Diagnosis Date  . Atrial fibrillation ([place])    s/p DCCV 07/2012  . Benign diastolic hypertension   . Chronic anticoagulation   . Chronic diastolic CHF (congestive heart failure) (HCC)    EF now 65% by echo 10/2012 with grade II diasotlid dysfunction  . Diabetes mellitus without complication ([place])   . Dilated cardiomyopathy secondary to tachycardia ([place]) 07/2012   2D echo 10/2012 showed EF 65% with grade II diastolic dysfunction  . Elevated cholesterol   . Encounter for long-term (current) use of other medications   . Hyperlipidemia   . Hypertension   . Liver cyst   . Memory loss     Patient Active Problem List   Diagnosis Date Noted  . Fracture of left leg 09/27/2016  . Fall 07/12/2016  . Leukocytosis 07/12/2016  . Type 2 diabetes mellitus with hyperlipidemia ([place]) 07/12/2016  . History of atrial fibrillation 07/12/2016  . Acetabular fracture ([place]) 07/11/2016  . Dilated cardiomyopathy secondary to tachycardia ([place]) 12/14/2013  . Syncope 06/14/2013  . Bradycardia 05/11/2013  . Fatigue 05/11/2013  . Chronic diastolic CHF (congestive heart failure) ([place]) 01/06/2013  . Mitral regurgitation 08/06/2012  . Persistent atrial fibrillation ([place]) 08/02/2012  . Essential hypertension, benign 08/02/2012  . Pure hypercholesterolemia 08/02/2012  . Liver cyst 08/02/2012    Past Surgical History:  Procedure Laterality Date  . APPENDECTOMY    .  BACK SURGERY     herniated disc  . CARDIOVERSION N/A 08/09/2012   Procedure: CARDIOVERSION;  Surgeon: Sueanne Margarita, MD;  Location: [facility];  Service: Cardiovascular;  Laterality: N/A;  . CARDIOVERSION N/A 09/28/2014   Procedure: CARDIOVERSION;  Surgeon: Sueanne Margarita, MD;  Location: [facility] ENDOSCOPY;  Service: Cardiovascular;  Laterality: N/A;  . TEE WITHOUT CARDIOVERSION N/A 08/09/2012   Procedure: TRANSESOPHAGEAL ECHOCARDIOGRAM (TEE);  Surgeon: Sueanne Margarita, MD;  Location[facility] ENDOSCOPY;  Service: Cardiovascular;  Laterality: N/A;   talk to angie in [place].  . TONSILLECTOMY      OB History    No data available       Home Medications    Prior to Admission medications   Medication Sig Start Date End Date Taking? Authorizing Provider  amiodarone (PACERONE) 100 MG tablet Take 100 mg by mouth daily.   Yes [provider]  ELIQUIS 2.5 MG TABS tablet TAKE 1 TABLET (2.5 MG TOTAL) BY MOUTH 2 (TWO) TIMES DAILY. 04/21/16  Yes Turner, Eber Hong, MD  furosemide (LASIX) 20 MG tablet Take 20 mg by mouth daily as needed for edema.    Yes [provider]  lisinopril (PRINIVIL,ZESTRIL) 40 MG tablet TAKE 1 TABLET (40 MG TOTAL) BY MOUTH DAILY. Patient taking differently: Take 20 mg by mouth daily. TAKE 1 TABLET (40 MG TOTAL) BY MOUTH DAILY. 04/22/16  Yes Turner, Eber Hong, MD  oxyCODONE-acetaminophen (PERCOCET) 5-325 MG tablet Take 0.5-1 tablets by mouth every 4 (four) hours as needed for severe pain. 09/26/16  Yes Leandrew Koyanagi, MD  potassium chloride SA (KLOR-CON M20) 20 MEQ tablet Take 2 tablets (40 mEq total) by mouth every other day. Patient taking differently: Take 20 mEq by mouth 2 (two) times daily.  05/23/16  Yes Turner, Eber Hong, MD  rosuvastatin (CRESTOR) 5 MG tablet Take 5 mg by mouth at bedtime.    Yes [provider]  sitaGLIPtin (JANUVIA) 100 MG tablet Take 100 mg by mouth daily.   Yes [provider]    Family History Family History  Problem Relation Age of Onset  . Heart disease Mother   . CAD Mother   . Heart disease Father   . CAD Father   . Pancreatic cancer Brother     Social History Social History  Substance Use Topics  . Smoking status: Former Research scientist (life sciences)  . Smokeless tobacco: Never Used  . Alcohol use 0.0 oz/week    1 - 2 Glasses of wine per week     Allergies   Codeine and Lortab [hydrocodone-acetaminophen]   Review of Systems Review of Systems  Unable to perform ROS: Dementia     Physical Exam Updated Vital Signs BP (!) 191/70 (BP Location: Left Arm)   Pulse  (!) 59   Temp 97.9 F (36.6 C) (Oral)   Resp 18   SpO2 96%   Physical Exam  Constitutional: She is oriented to person, place, and time. Vital signs are normal. She appears well-developed and well-nourished.  Non-toxic appearance.  Patient is wearing an adult diaper.  HENT:  Head: Normocephalic and atraumatic.  Right Ear: External ear normal.  Left Ear: External ear normal.  Nose: Nose normal.  Mouth/Throat: Oropharynx is clear and moist.  Eyes: Pupils are equal, round, and reactive to light. No scleral icterus.  Neck: Normal range of motion.  Cardiovascular: Normal rate, normal heart sounds and intact distal pulses.  An irregularly irregular rhythm present.  No murmur heard. Radial pulses 2+ bilaterally. Dorsalis Pedis  pulses 2+ bilaterally.  Pulmonary/Chest: Effort normal and breath sounds normal. No respiratory distress. She has no wheezes.  Abdominal: Soft. Normal appearance and bowel sounds are normal. There is no tenderness.  Musculoskeletal: She exhibits tenderness. She exhibits no edema or deformity.  The patient is diffusely tender over bilateral lower extremities. She is mostly tender on her right anterior hip. Questionable swelling to bilateral lower extremities, she states that her legs feel "heavy."    Neurological: She is alert and oriented to person, place, and time. No sensory deficit. GCS eye subscore is 4. GCS verbal subscore is 5. GCS motor subscore is 6.  Skin: Skin is warm and dry. She is not diaphoretic.  Psychiatric: Her mood appears not anxious. She is not agitated. She exhibits abnormal recent memory.  Nursing note and vitals reviewed.    ED Treatments / Results  DIAGNOSTIC STUDIES: Oxygen Saturation is 96% on RA, normal by my interpretation.   COORDINATION OF CARE: 10:36 AM- Will order urinalysis. Pt gave permission to discuss care with her son and he will be contacted. Will order imaging. Pt verbalizes understanding and agrees to plan.  Medications    ciprofloxacin (CIPRO) tablet 250 mg (not administered)  fentaNYL (SUBLIMAZE) injection 40 mcg (40 mcg Intravenous Given 09/27/16 1354)  phytonadione (VITAMIN K) 10 mg in dextrose 5 % 50 mL IVPB (0 mg Intravenous Stopped 09/27/16 1652)    Labs (all labs ordered are listed, but only abnormal results are displayed) Labs Reviewed  URINALYSIS, ROUTINE W REFLEX MICROSCOPIC - Abnormal; Notable for the following:       Result Value   APPearance CLOUDY (*)    Hgb urine dipstick MODERATE (*)    Ketones, ur 5 (*)    Leukocytes, UA LARGE (*)    Bacteria, UA MANY (*)    Squamous Epithelial / LPF 6-30 (*)    All other components within normal limits  PROTIME-INR - Abnormal; Notable for the following:    Prothrombin Time 17.5 (*)    All other components within normal limits  COMPREHENSIVE METABOLIC PANEL - Abnormal; Notable for the following:    Glucose, Bld 107 (*)    BUN 27 (*)    Creatinine, Ser 1.15 (*)    GFR calc non Af Amer 41 (*)    GFR calc Af Amer 47 (*)    All other components within normal limits  URINE CULTURE  CBC WITH DIFFERENTIAL/PLATELET  CK    EKG  EKG Interpretation None       Radiology Dg Pelvis 1-2 Views  Result Date: 09/27/2016 CLINICAL DATA:  Right hip and pelvic pain.  Recent pelvic fracture. EXAM: PELVIS - 1-2 VIEW COMPARISON:  07/21/2016 FINDINGS: Acute subcapital fracture of the right femoral neck is seen which shows mild displacement. No evidence of dislocation. Healing subacute fractures of the right superior and inferior pubic rami are seen, which show increased healing response since prior study. Generalized osteopenia noted. IMPRESSION: Mildly displaced subcapital fracture of the right femoral neck. Healing subacute fractures of right superior and inferior pubic rami. Electronically Signed   By: Earle Gell M.D.   On: 09/27/2016 12:15   Ct Hip Right Wo Contrast  Result Date: 09/27/2016 CLINICAL DATA:  Evaluate subcapital fracture. EXAM: CT OF THE RIGHT  HIP WITHOUT CONTRAST TECHNIQUE: Multidetector CT imaging of the right hip was performed according to the standard protocol. Multiplanar CT image reconstructions were also generated. COMPARISON:  Radiographs 09/27/2016 hand CT scan 07/11/2016 FINDINGS: There is a wound mildly impacted  and slightly displaced subcapital fracture of the right hip. There is mild internal rotation of the femoral head and slight lateral displacement of the femoral neck, giving a slight varus deformity. Moderate right hip joint degenerative changes with joint space narrowing and spurring. The acetabulum is intact. No fracture. There are partially united superior and inferior pubic rami fractures on the right side. Exuberant callus formation. The visualized right SI joint appears normal. The surrounding hip and pelvic musculature are grossly normal. No significant intrapelvic abnormalities on the right side. IMPRESSION: 1. Mildly impacted and slightly displaced subcapital fracture of the right hip as described above. 2. Partially united superior and inferior pubic rami fractures on the right. Electronically Signed   By: Marijo Sanes M.D.   On: 09/27/2016 15:06   Dg Femur Min 2 Views Left  Result Date: 09/27/2016 CLINICAL DATA:  Fall.  Left hip and pelvic pain.  Initial encounter. EXAM: LEFT FEMUR 2 VIEWS COMPARISON:  None. FINDINGS: There is no evidence of fracture or other focal bone lesions. Mild peripheral vascular calcification noted. IMPRESSION: No acute findings. Electronically Signed   By: Earle Gell M.D.   On: 09/27/2016 12:16   Dg Femur Min 2 Views Right  Result Date: 09/27/2016 CLINICAL DATA:  Fall.  Right hip pain.  Initial encounter. EXAM: RIGHT FEMUR 2 VIEWS COMPARISON:  None. FINDINGS: Mildly displaced subcapital fracture right femoral neck is seen. No other femur fracture identified. No evidence of hip dislocation. Mild peripheral vascular calcification noted. IMPRESSION: Mildly displaced subcapital right femoral  neck fracture. Electronically Signed   By: Earle Gell M.D.   On: 09/27/2016 12:18    Procedures Procedures (including critical care time)  Medications Ordered in ED Medications  ciprofloxacin (CIPRO) tablet 250 mg (not administered)  fentaNYL (SUBLIMAZE) injection 40 mcg (40 mcg Intravenous Given 09/27/16 1354)  phytonadione (VITAMIN K) 10 mg in dextrose 5 % 50 mL IVPB (0 mg Intravenous Stopped 09/27/16 1652)     Initial Impression / Assessment and Plan / ED Course  I have reviewed the triage vital signs and the nursing notes.  Pertinent labs & imaging results that were available during my care of the patient were reviewed by me and considered in my medical decision making (see chart for details).  Clinical Course as of Sep 28 1930  Sat Sep 27, 2016  1322 Spoke with orthopedics who will come see patient.   [EH]  1339 Spoke with hospitalist, will see patient.   [EH]  44 Spoke with Dr. Erlinda Hong who [facility].  Will contact hospitalist.   [EH]    Clinical Course User Index [EH] Lorin Glass, PA-C   Cynthia Wright presents with continued right hip pain.  Imaging reveals a right mildly impacted and slightly displaced subcapital femoral neck fracture.  Unsure when injury occurred as patient is demented, however was not present on imaging obtained on 09/14/16, suspect from when she "slid out of bed" 1-2 weeks ago. Based on patient recent extensive travel, bilateral calf tenderness and questionable leg swelling lower extremity bilateral duplex was performed and was negative for DVT.  Based on sons report that patient has been more confused and less able to care for her self UA was ordered.  Orthopedics was consulted who requested CT hip be obtained and that she be transferred to [facility].  [facility] hospitalists consulted for admission.    At this time patient appears stable for transfer and admission.  She is not complaining of  any pain at this time.    The  patient appears reasonably stabilized for admission considering the current resources, flow, and capabilities available in the ED at this time, and I doubt any other [facility] requiring further screening and/or treatment in the ED prior to admission.   Final Clinical Impressions(s) / ED Diagnoses   Final diagnoses:  Pain  Fall  Closed right hip fracture, initial encounter [facility])    New Prescriptions New Prescriptions   No medications on file   I personally performed the services described in this documentation, which was scribed in my presence. The recorded information has been reviewed and is accurate.     Lorin Glass, PA-C 09/27/16 1933    Varney Biles, MD 09/28/16 [phone]

## 2016-09-27 NOTE — ED Notes (Signed)
[facility] called for transport. 

## 2016-09-27 NOTE — ED Triage Notes (Signed)
Per EMS, pt from home.  Pt has fx to rt pelvis x 2 months ago.  Today patient pain increased and she could not get out of bed.  Pt states no new injury.  1 hour ago, pt took home oxycodone and is now getting some pain relief.  Pt family states pt has been more confused lately but pt is alert and oriented x 4 at this time.  Vitals: 122/70, hr 64, 95% ra, resp 16, cbg 122

## 2016-09-27 NOTE — ED Notes (Signed)
Patient's son, Shanon Brow [phone] (home), [phone] (mobile) is requesting and update when patient is transferred.

## 2016-09-27 NOTE — H&P (Signed)
History and Physical    Cynthia Wright YJE:[id number] DOB: 14-Apr-1924 DOA: 09/27/2016  Referring MD/NP/PA:  PCP: Aretta Nip, MD  Outpatient Specialists: Dr Erlinda Hong Patient coming from: Home  Chief Complaint: Right hip pain  HPI: Cynthia Wright is a 83 y.o. female with medical history significant for but not limited to atrial fibrillation and chronic diastolic dysfunction presenting with one-day history of right hip pain without any obvious antecedent traumatic injury over last 2 days but has fallen out of bed few. weeks ago. She has been having difficulty walking due to the pain which reason she was brought to the ED by ambulance.  She was recently discharged from hospital and treated for pelvic fracture.  No history of Chest pain, shortness of breath, fever or chills.   ED Course: at the ED patient was noted to be hypertensive and x-ray revealed Mildly displaced subcapital fracture of the right femoral neck. Orthopedist was consulted who requested Medical admission due to multiple medical problems   Review of Systems: nausea without vomiting, otherwise as noted above.    Past Medical History:  Diagnosis Date  . Atrial fibrillation ([place])    s/p DCCV 07/2012  . Benign diastolic hypertension   . Chronic anticoagulation   . Chronic diastolic CHF (congestive heart failure) (HCC)    EF now 65% by echo 10/2012 with grade II diasotlid dysfunction  . Diabetes mellitus without complication ([place])   . Dilated cardiomyopathy secondary to tachycardia ([place]) 07/2012   2D echo 10/2012 showed EF 65% with grade II diastolic dysfunction  . Elevated cholesterol   . Encounter for long-term (current) use of other medications   . Hyperlipidemia   . Hypertension   . Liver cyst   . Memory loss     Past Surgical History:  Procedure Laterality Date  . APPENDECTOMY    . BACK SURGERY     herniated disc  . CARDIOVERSION N/A 08/09/2012   Procedure: CARDIOVERSION;  Surgeon: Sueanne Margarita, MD;   Location: [place];  Service: Cardiovascular;  Laterality: N/A;  . CARDIOVERSION N/A 09/28/2014   Procedure: CARDIOVERSION;  Surgeon: Sueanne Margarita, MD;  Location: [facility] ENDOSCOPY;  Service: Cardiovascular;  Laterality: N/A;  . TEE WITHOUT CARDIOVERSION N/A 08/09/2012   Procedure: TRANSESOPHAGEAL ECHOCARDIOGRAM (TEE);  Surgeon: Sueanne Margarita, MD;  Location: [facility] ENDOSCOPY;  Service: Cardiovascular;  Laterality: N/A;  talk to angie in [place].  . TONSILLECTOMY       reports that she has quit smoking. She has never used smokeless tobacco. She reports that she drinks alcohol. She reports that she does not use drugs.  Allergies  Allergen Reactions  . Codeine Nausea And Vomiting  . Lortab [Hydrocodone-Acetaminophen] Nausea And Vomiting    Family History  Problem Relation Age of Onset  . Heart disease Mother   . CAD Mother   . Heart disease Father   . CAD Father   . Pancreatic cancer Brother      Prior to Admission medications   Medication Sig Start Date End Date Taking? Authorizing Provider  amiodarone (PACERONE) 100 MG tablet Take 100 mg by mouth daily.   Yes [provider]  ELIQUIS 2.5 MG TABS tablet TAKE 1 TABLET (2.5 MG TOTAL) BY MOUTH 2 (TWO) TIMES DAILY. 04/21/16  Yes Turner, Eber Hong, MD  furosemide (LASIX) 20 MG tablet Take 20 mg by mouth daily as needed for edema.    Yes [provider]  lisinopril (PRINIVIL,ZESTRIL) 40 MG tablet TAKE 1 TABLET (40 MG  TOTAL) BY MOUTH DAILY. Patient taking differently: Take 20 mg by mouth daily. TAKE 1 TABLET (40 MG TOTAL) BY MOUTH DAILY. 04/22/16  Yes Turner, Eber Hong, MD  oxyCODONE-acetaminophen (PERCOCET) 5-325 MG tablet Take 0.5-1 tablets by mouth every 4 (four) hours as needed for severe pain. 09/26/16  Yes Leandrew Koyanagi, MD  potassium chloride SA (KLOR-CON M20) 20 MEQ tablet Take 2 tablets (40 mEq total) by mouth every other day. Patient taking differently: Take 20 mEq by mouth 2 (two) times daily.  05/23/16  Yes Turner, Eber Hong,  MD  rosuvastatin (CRESTOR) 5 MG tablet Take 5 mg by mouth at bedtime.    Yes [provider]  sitaGLIPtin (JANUVIA) 100 MG tablet Take 100 mg by mouth daily.   Yes [provider]    Physical Exam: Vitals:   09/27/16 1314 09/27/16 1316 09/27/16 1433 09/27/16 1618  BP:  (!) 220/66 (!) 200/71 (!) 186/70  Pulse: 60 60 (!) 58 60  Resp: 17 17 16 15   Temp:      TempSrc:      SpO2: 96% 95% 96% 97%      Constitutional: NAD, calm, comfortable Vitals:   09/27/16 1314 09/27/16 1316 09/27/16 1433 09/27/16 1618  BP:  (!) 220/66 (!) 200/71 (!) 186/70  Pulse: 60 60 (!) 58 60  Resp: 17 17 16 15   Temp:      TempSrc:      SpO2: 96% 95% 96% 97%   Eyes: PERRL, lids and conjunctivae normal ENMT: Mucous membranes are moist. Posterior pharynx clear of any exudate or lesions.Normal dentition.  Neck: normal, supple, no masses, no thyromegaly Respiratory: clear to auscultation bilaterally, no wheezing, no crackles. Normal respiratory effort. No accessory muscle use.  Cardiovascular: Irregularly irregular rhythm, no gallops.  Abdomen: no tenderness, no masses palpated. No hepatosplenomegaly. Bowel sounds positive.  Musculoskeletal: no clubbing / cyanosis. Right lateral hip and ipsilateral groin tenderness.  Skin: no rashes, lesions, ulcers. No induration Neurologic:Alert and oriented 3   Labs on Admission: I have personally reviewed following labs and imaging studies  CBC:  Recent Labs Lab 09/27/16 1240  WBC 8.1  NEUTROABS 6.4  HGB 13.0  HCT 39.1  MCV 100.0  PLT 656   Basic Metabolic Panel:  Recent Labs Lab 09/27/16 1240  NA 142  K 4.2  CL 109  CO2 25  GLUCOSE 107*  BUN 27*  CREATININE 1.15*  CALCIUM 9.3   GFR: Estimated Creatinine Clearance: 29.8 mL/min (A) (by C-G formula based on SCr of 1.15 mg/dL (H)). Liver Function Tests:  Recent Labs Lab 09/27/16 1240  AST 20  ALT 22  ALKPHOS 91  BILITOT 0.7  PROT 6.5  ALBUMIN 3.6   No results for  input(s): LIPASE, AMYLASE in the last 168 hours. No results for input(s): AMMONIA in the last 168 hours. Coagulation Profile:  Recent Labs Lab 09/27/16 1240  INR 1.42   Cardiac Enzymes: No results for input(s): CKTOTAL, CKMB, CKMBINDEX, TROPONINI in the last 168 hours. BNP (last 3 results) No results for input(s): PROBNP in the last 8760 hours. HbA1C: No results for input(s): HGBA1C in the last 72 hours. CBG: No results for input(s): GLUCAP in the last 168 hours. Lipid Profile: No results for input(s): CHOL, HDL, LDLCALC, TRIG, CHOLHDL, LDLDIRECT in the last 72 hours. Thyroid Function Tests: No results for input(s): TSH, T4TOTAL, FREET4, T3FREE, THYROIDAB in the last 72 hours. Anemia Panel: No results for input(s): VITAMINB12, FOLATE, FERRITIN, TIBC, IRON, RETICCTPCT in the last 72  hours. Urine analysis:    Component Value Date/Time   COLORURINE YELLOW 09/27/2016 1039   APPEARANCEUR CLOUDY (A) 09/27/2016 1039   LABSPEC 1.010 09/27/2016 1039   PHURINE 5.0 09/27/2016 1039   GLUCOSEU NEGATIVE 09/27/2016 1039   HGBUR MODERATE (A) 09/27/2016 1039   BILIRUBINUR NEGATIVE 09/27/2016 1039   KETONESUR 5 (A) 09/27/2016 1039   PROTEINUR NEGATIVE 09/27/2016 1039   NITRITE NEGATIVE 09/27/2016 1039   LEUKOCYTESUR LARGE (A) 09/27/2016 1039   Sepsis Labs: @LABRCNTIP (procalcitonin:4,lacticidven:4) )No results found for this or any previous visit (from the past 240 hour(s)).   Radiological Exams on Admission: Dg Pelvis 1-2 Views  Result Date: 09/27/2016 CLINICAL DATA:  Right hip and pelvic pain.  Recent pelvic fracture. EXAM: PELVIS - 1-2 VIEW COMPARISON:  07/21/2016 FINDINGS: Acute subcapital fracture of the right femoral neck is seen which shows mild displacement. No evidence of dislocation. Healing subacute fractures of the right superior and inferior pubic rami are seen, which show increased healing response since prior study. Generalized osteopenia noted. IMPRESSION: Mildly displaced  subcapital fracture of the right femoral neck. Healing subacute fractures of right superior and inferior pubic rami. Electronically Signed   By: Earle Gell M.D.   On: 09/27/2016 12:15   Ct Hip Right Wo Contrast  Result Date: 09/27/2016 CLINICAL DATA:  Evaluate subcapital fracture. EXAM: CT OF THE RIGHT HIP WITHOUT CONTRAST TECHNIQUE: Multidetector CT imaging of the right hip was performed according to the standard protocol. Multiplanar CT image reconstructions were also generated. COMPARISON:  Radiographs 09/27/2016 hand CT scan 07/11/2016 FINDINGS: There is a wound mildly impacted and slightly displaced subcapital fracture of the right hip. There is mild internal rotation of the femoral head and slight lateral displacement of the femoral neck, giving a slight varus deformity. Moderate right hip joint degenerative changes with joint space narrowing and spurring. The acetabulum is intact. No fracture. There are partially united superior and inferior pubic rami fractures on the right side. Exuberant callus formation. The visualized right SI joint appears normal. The surrounding hip and pelvic musculature are grossly normal. No significant intrapelvic abnormalities on the right side. IMPRESSION: 1. Mildly impacted and slightly displaced subcapital fracture of the right hip as described above. 2. Partially united superior and inferior pubic rami fractures on the right. Electronically Signed   By: Marijo Sanes M.D.   On: 09/27/2016 15:06   Dg Femur Min 2 Views Left  Result Date: 09/27/2016 CLINICAL DATA:  Fall.  Left hip and pelvic pain.  Initial encounter. EXAM: LEFT FEMUR 2 VIEWS COMPARISON:  None. FINDINGS: There is no evidence of fracture or other focal bone lesions. Mild peripheral vascular calcification noted. IMPRESSION: No acute findings. Electronically Signed   By: Earle Gell M.D.   On: 09/27/2016 12:16   Dg Femur Min 2 Views Right  Result Date: 09/27/2016 CLINICAL DATA:  Fall.  Right hip pain.   Initial encounter. EXAM: RIGHT FEMUR 2 VIEWS COMPARISON:  None. FINDINGS: Mildly displaced subcapital fracture right femoral neck is seen. No other femur fracture identified. No evidence of hip dislocation. Mild peripheral vascular calcification noted. IMPRESSION: Mildly displaced subcapital right femoral neck fracture. Electronically Signed   By: Earle Gell M.D.   On: 09/27/2016 12:18    EKG: Independently reviewed  Assessment/Plan Active Problems:   Fracture of left leg   #1Displaced subcapital fracture of the right femoral neck: Supportive care/pain control immobilization until cleared by orthopedist  #2 hypertension:  Uncontrolled systolic BP may be related to pain-consider adding antihypertensive agent  prn  #3 DM2: Continue Januvia Monitor CBGs Consider sliding scale as needed   DVT prophylaxis: (Lovenox) Code Status: (Full) Family Communication: Son and daughter in law at bedside Disposition Plan: (SNF) Consults called: Orthopedics (Dr Erlinda Hong) Admission status:(inpatient medical floor)   OSEI-BONSU,Teige Rountree MD [facility] Pager [phone]  If 7PM-7AM, please contact night-coverage [email or web address] Password [email or web address]  09/27/2016, 5:28 PM

## 2016-09-27 NOTE — Progress Notes (Signed)
*  Preliminary Results* Bilateral lower extremity venous duplex completed. Bilateral lower extremities are negative for deep vein thrombosis. There is no evidence of Baker's cyst bilaterally.  09/27/2016 12:47 PM Maudry Mayhew, BS, RVT, RDCS, RDMS

## 2016-09-27 NOTE — ED Notes (Signed)
Patient transported to X-ray 

## 2016-09-27 NOTE — ED Notes (Signed)
Patient transported to CT 

## 2016-09-27 NOTE — ED Notes (Signed)
Orthopedics at bedside 

## 2016-09-27 NOTE — Consult Note (Signed)
ORTHOPAEDIC CONSULTATION  REQUESTING PHYSICIAN: Benito Mccreedy, MD  Chief Complaint: Right femoral neck hip fracture  HPI: Cynthia Wright is a 82 y.o. female who presents with worsening right hip pain with inability to ambulate.  Patient has worsening dementia which has complicated a clear history of events in the last several weeks.  Patient had xrays 2 weeks ago that were negative because of severe pain.  She slipped down onto the floor some time after but her pain wasn't significantly worse until in the last couple of days.  She saw me in the office yesterday and I ordered a CT scan to better evaluate the hip.  She came to the ER today due to worsening pain.  Xrays showed a displaced femoral neck fracture.  I was asked to consult on this matter.   Past Medical History:  Diagnosis Date  . Atrial fibrillation ([place])    s/p DCCV 07/2012  . Benign diastolic hypertension   . Chronic anticoagulation   . Chronic diastolic CHF (congestive heart failure) (HCC)    EF now 65% by echo 10/2012 with grade II diasotlid dysfunction  . Diabetes mellitus without complication ([place])   . Dilated cardiomyopathy secondary to tachycardia ([place]) 07/2012   2D echo 10/2012 showed EF 65% with grade II diastolic dysfunction  . Elevated cholesterol   . Encounter for long-term (current) use of other medications   . Hyperlipidemia   . Hypertension   . Liver cyst   . Memory loss    Past Surgical History:  Procedure Laterality Date  . APPENDECTOMY    . BACK SURGERY     herniated disc  . CARDIOVERSION N/A 08/09/2012   Procedure: CARDIOVERSION;  Surgeon: Sueanne Margarita, MD;  Location: [place];  Service: Cardiovascular;  Laterality: N/A;  . CARDIOVERSION N/A 09/28/2014   Procedure: CARDIOVERSION;  Surgeon: Sueanne Margarita, MD;  Location: [facility] ENDOSCOPY;  Service: Cardiovascular;  Laterality: N/A;  . TEE WITHOUT CARDIOVERSION N/A 08/09/2012   Procedure: TRANSESOPHAGEAL ECHOCARDIOGRAM (TEE);  Surgeon: Sueanne Margarita, MD;  Location: [facility] ENDOSCOPY;  Service: Cardiovascular;  Laterality: N/A;  talk to angie in [place].  . TONSILLECTOMY     Social History   Social History  . Marital status: Single    Spouse name: N/A  . Number of children: N/A  . Years of education: N/A   Social History Main Topics  . Smoking status: Former Research scientist (life sciences)  . Smokeless tobacco: Never Used  . Alcohol use 0.0 oz/week    1 - 2 Glasses of wine per week  . Drug use: No  . Sexual activity: Not Asked   Other Topics Concern  . None   Social History Narrative   Divorced, 3 children   Right handed   Some college   2 cups daily    Family History  Problem Relation Age of Onset  . Heart disease Mother   . CAD Mother   . Heart disease Father   . CAD Father   . Pancreatic cancer Brother    Allergies  Allergen Reactions  . Codeine Nausea And Vomiting  . Lortab [Hydrocodone-Acetaminophen] Nausea And Vomiting   Prior to Admission medications   Medication Sig Start Date End Date Taking? Authorizing Provider  amiodarone (PACERONE) 100 MG tablet Take 100 mg by mouth daily.   Yes [provider]  ELIQUIS 2.5 MG TABS tablet TAKE 1 TABLET (2.5 MG TOTAL) BY MOUTH 2 (TWO) TIMES DAILY. 04/21/16  Yes Sueanne Margarita, MD  furosemide (  LASIX) 20 MG tablet Take 20 mg by mouth daily as needed for edema.    Yes [provider]  lisinopril (PRINIVIL,ZESTRIL) 40 MG tablet TAKE 1 TABLET (40 MG TOTAL) BY MOUTH DAILY. Patient taking differently: Take 20 mg by mouth daily. TAKE 1 TABLET (40 MG TOTAL) BY MOUTH DAILY. 04/22/16  Yes Turner, Eber Hong, MD  oxyCODONE-acetaminophen (PERCOCET) 5-325 MG tablet Take 0.5-1 tablets by mouth every 4 (four) hours as needed for severe pain. 09/26/16  Yes Leandrew Koyanagi, MD  potassium chloride SA (KLOR-CON M20) 20 MEQ tablet Take 2 tablets (40 mEq total) by mouth every other day. Patient taking differently: Take 20 mEq by mouth 2 (two) times daily.  05/23/16  Yes Turner, Eber Hong, MD  rosuvastatin  (CRESTOR) 5 MG tablet Take 5 mg by mouth at bedtime.    Yes [provider]  sitaGLIPtin (JANUVIA) 100 MG tablet Take 100 mg by mouth daily.   Yes [provider]   Dg Pelvis 1-2 Views  Result Date: 09/27/2016 CLINICAL DATA:  Right hip and pelvic pain.  Recent pelvic fracture. EXAM: PELVIS - 1-2 VIEW COMPARISON:  07/21/2016 FINDINGS: Acute subcapital fracture of the right femoral neck is seen which shows mild displacement. No evidence of dislocation. Healing subacute fractures of the right superior and inferior pubic rami are seen, which show increased healing response since prior study. Generalized osteopenia noted. IMPRESSION: Mildly displaced subcapital fracture of the right femoral neck. Healing subacute fractures of right superior and inferior pubic rami. Electronically Signed   By: Earle Gell M.D.   On: 09/27/2016 12:15   Ct Hip Right Wo Contrast  Result Date: 09/27/2016 CLINICAL DATA:  Evaluate subcapital fracture. EXAM: CT OF THE RIGHT HIP WITHOUT CONTRAST TECHNIQUE: Multidetector CT imaging of the right hip was performed according to the standard protocol. Multiplanar CT image reconstructions were also generated. COMPARISON:  Radiographs 09/27/2016 hand CT scan 07/11/2016 FINDINGS: There is a wound mildly impacted and slightly displaced subcapital fracture of the right hip. There is mild internal rotation of the femoral head and slight lateral displacement of the femoral neck, giving a slight varus deformity. Moderate right hip joint degenerative changes with joint space narrowing and spurring. The acetabulum is intact. No fracture. There are partially united superior and inferior pubic rami fractures on the right side. Exuberant callus formation. The visualized right SI joint appears normal. The surrounding hip and pelvic musculature are grossly normal. No significant intrapelvic abnormalities on the right side. IMPRESSION: 1. Mildly impacted and slightly displaced subcapital  fracture of the right hip as described above. 2. Partially united superior and inferior pubic rami fractures on the right. Electronically Signed   By: Marijo Sanes M.D.   On: 09/27/2016 15:06   Dg Femur Min 2 Views Left  Result Date: 09/27/2016 CLINICAL DATA:  Fall.  Left hip and pelvic pain.  Initial encounter. EXAM: LEFT FEMUR 2 VIEWS COMPARISON:  None. FINDINGS: There is no evidence of fracture or other focal bone lesions. Mild peripheral vascular calcification noted. IMPRESSION: No acute findings. Electronically Signed   By: Earle Gell M.D.   On: 09/27/2016 12:16   Dg Femur Min 2 Views Right  Result Date: 09/27/2016 CLINICAL DATA:  Fall.  Right hip pain.  Initial encounter. EXAM: RIGHT FEMUR 2 VIEWS COMPARISON:  None. FINDINGS: Mildly displaced subcapital fracture right femoral neck is seen. No other femur fracture identified. No evidence of hip dislocation. Mild peripheral vascular calcification noted. IMPRESSION: Mildly displaced subcapital right femoral neck  fracture. Electronically Signed   By: Earle Gell M.D.   On: 09/27/2016 12:18    All pertinent xrays, MRI, CT independently reviewed and interpreted  Positive ROS: All other systems have been reviewed and were otherwise negative with the exception of those mentioned in the HPI and as above.  Physical Exam: General: Alert, no acute distress Cardiovascular: No pedal edema Respiratory: No cyanosis, no use of accessory musculature GI: No organomegaly, abdomen is soft and non-tender Skin: No lesions in the area of chief complaint Neurologic: Sensation intact distally Psychiatric: Patient has waxing and waning periods of dementia and lucidity Lymphatic: No axillary or cervical lymphadenopathy  MUSCULOSKELETAL:  - pain with movement of the hip and extremity - skin intact - NVI distally - compartments soft  Assessment: Right femoral neck hip fracture  Plan: - partial hip replacement is recommended, patient and family are aware  of r/b/a and wish to proceed - consent obtained - medical optimization per primary team - surgery is planned for Sunday am - she has stable CHF which does not need additional workup per hospitalist - appreciate their assistance - she took 1 dose of eliquis this morning - vitamin K given - NPO after midnight   Thank you for the consult and the opportunity to see Cynthia Wright. Eduard Roux, MD [place] 9:21 PM

## 2016-09-28 ENCOUNTER — Inpatient Hospital Stay (HOSPITAL_COMMUNITY): Payer: Medicare Other

## 2016-09-28 ENCOUNTER — Inpatient Hospital Stay (HOSPITAL_COMMUNITY): Payer: Medicare Other | Admitting: Anesthesiology

## 2016-09-28 ENCOUNTER — Encounter (HOSPITAL_COMMUNITY)
Admission: EM | Disposition: A | Discharge status: Skilled Nursing Facility | Payer: Self-pay | Source: Home / Self Care | Attending: Internal Medicine

## 2016-09-28 ENCOUNTER — Encounter (HOSPITAL_COMMUNITY): Payer: Self-pay | Admitting: Anesthesiology

## 2016-09-28 DIAGNOSIS — S72001A Fracture of unspecified part of neck of right femur, initial encounter for closed fracture: Secondary | ICD-10-CM

## 2016-09-28 DIAGNOSIS — E785 Hyperlipidemia, unspecified: Secondary | ICD-10-CM

## 2016-09-28 DIAGNOSIS — I10 Essential (primary) hypertension: Secondary | ICD-10-CM

## 2016-09-28 DIAGNOSIS — E1159 Type 2 diabetes mellitus with other circulatory complications: Secondary | ICD-10-CM

## 2016-09-28 DIAGNOSIS — I482 Chronic atrial fibrillation: Secondary | ICD-10-CM

## 2016-09-28 HISTORY — PX: ANTERIOR APPROACH HEMI HIP ARTHROPLASTY: SHX6690

## 2016-09-28 LAB — GLUCOSE, CAPILLARY
GLUCOSE-CAPILLARY: 198 mg/dL — AB (ref 65–99)
Glucose-Capillary: 123 mg/dL — ABNORMAL HIGH (ref 65–99)
Glucose-Capillary: 149 mg/dL — ABNORMAL HIGH (ref 65–99)

## 2016-09-28 LAB — SURGICAL PCR SCREEN
MRSA, PCR: NEGATIVE
Staphylococcus aureus: NEGATIVE

## 2016-09-28 SURGERY — HEMIARTHROPLASTY, HIP, DIRECT ANTERIOR APPROACH, FOR FRACTURE
Anesthesia: General | Site: Leg Upper | Laterality: Right

## 2016-09-28 MED ORDER — PHENOL 1.4 % MT LIQD
1.0000 | OROMUCOSAL | Status: DC | PRN
  Filled 2016-09-28: qty 177

## 2016-09-28 MED ORDER — SODIUM CHLORIDE 0.9 % IV SOLN
INTRAVENOUS | Status: DC
  Administered 2016-09-28: 17:00:00 via INTRAVENOUS

## 2016-09-28 MED ORDER — PHENYLEPHRINE HCL 10 MG/ML IJ SOLN
INTRAMUSCULAR | Status: DC | PRN
  Administered 2016-09-28: 50 ug/min via INTRAVENOUS

## 2016-09-28 MED ORDER — LIDOCAINE 2% (20 MG/ML) 5 ML SYRINGE
INTRAMUSCULAR | Status: AC
  Filled 2016-09-28: qty 10

## 2016-09-28 MED ORDER — ONDANSETRON HCL 4 MG/2ML IJ SOLN: 4.0000 mg | Freq: Four times a day (QID) | INTRAMUSCULAR | Status: DC | PRN

## 2016-09-28 MED ORDER — HYDRALAZINE HCL 20 MG/ML IJ SOLN
10.0000 mg | Freq: Once | INTRAMUSCULAR | Status: AC
  Administered 2016-09-28: 10 mg via INTRAVENOUS

## 2016-09-28 MED ORDER — ALUM & MAG HYDROXIDE-SIMETH 200-200-20 MG/5ML PO SUSP: 30.0000 mL | ORAL | Status: DC | PRN

## 2016-09-28 MED ORDER — LIDOCAINE 2% (20 MG/ML) 5 ML SYRINGE
INTRAMUSCULAR | Status: DC | PRN
  Administered 2016-09-28: 40 mg via INTRAVENOUS

## 2016-09-28 MED ORDER — ONDANSETRON HCL 4 MG/2ML IJ SOLN
4.0000 mg | Freq: Once | INTRAMUSCULAR | Status: AC | PRN
  Administered 2016-09-28: 4 mg via INTRAVENOUS

## 2016-09-28 MED ORDER — FENTANYL CITRATE (PF) 100 MCG/2ML IJ SOLN
25.0000 ug | INTRAMUSCULAR | Status: DC | PRN
  Administered 2016-09-28: 25 ug via INTRAVENOUS
  Administered 2016-09-28: 50 ug via INTRAVENOUS
  Administered 2016-09-28 (×3): 25 ug via INTRAVENOUS

## 2016-09-28 MED ORDER — TRANEXAMIC ACID 1000 MG/10ML IV SOLN
1000.0000 mg | Freq: Once | INTRAVENOUS | Status: AC
  Administered 2016-09-28: 1000 mg via INTRAVENOUS
  Filled 2016-09-28: qty 10

## 2016-09-28 MED ORDER — TRANEXAMIC ACID 1000 MG/10ML IV SOLN
INTRAVENOUS | Status: AC | PRN
  Administered 2016-09-28: 2000 mg via TOPICAL

## 2016-09-28 MED ORDER — 0.9 % SODIUM CHLORIDE (POUR BTL) OPTIME
TOPICAL | Status: DC | PRN
  Administered 2016-09-28: 1000 mL

## 2016-09-28 MED ORDER — ACETAMINOPHEN 325 MG PO TABS
650.0000 mg | ORAL_TABLET | Freq: Four times a day (QID) | ORAL | Status: DC | PRN
  Administered 2016-09-29 – 2016-09-30 (×2): 650 mg via ORAL
  Filled 2016-09-28 (×2): qty 2

## 2016-09-28 MED ORDER — OXYCODONE HCL 5 MG PO TABS: 5.0000 mg | ORAL_TABLET | ORAL | 0 refills | Status: DC | PRN

## 2016-09-28 MED ORDER — FENTANYL CITRATE (PF) 100 MCG/2ML IJ SOLN
INTRAMUSCULAR | Status: AC
  Administered 2016-09-28: 25 ug via INTRAVENOUS
  Filled 2016-09-28: qty 2

## 2016-09-28 MED ORDER — OXYCODONE-ACETAMINOPHEN 5-325 MG PO TABS
0.5000 | ORAL_TABLET | ORAL | Status: DC | PRN
Start: 2016-09-28 — End: 2016-09-29
  Administered 2016-09-28: 1 via ORAL
  Filled 2016-09-28: qty 1

## 2016-09-28 MED ORDER — SODIUM CHLORIDE 0.9 % IR SOLN
Status: DC | PRN
  Administered 2016-09-28: 3000 mL

## 2016-09-28 MED ORDER — FUROSEMIDE 20 MG PO TABS: 20.0000 mg | ORAL_TABLET | Freq: Every day | ORAL | Status: DC | PRN

## 2016-09-28 MED ORDER — ACETAMINOPHEN 650 MG RE SUPP: 650.0000 mg | Freq: Four times a day (QID) | RECTAL | Status: DC | PRN

## 2016-09-28 MED ORDER — SUGAMMADEX SODIUM 200 MG/2ML IV SOLN
INTRAVENOUS | Status: DC | PRN
  Administered 2016-09-28: 200 mg via INTRAVENOUS

## 2016-09-28 MED ORDER — FENTANYL CITRATE (PF) 250 MCG/5ML IJ SOLN
INTRAMUSCULAR | Status: DC | PRN
Start: 2016-09-28 — End: 2016-09-28
  Administered 2016-09-28: 100 ug via INTRAVENOUS

## 2016-09-28 MED ORDER — METHOCARBAMOL 1000 MG/10ML IJ SOLN: 500.0000 mg | Freq: Four times a day (QID) | INTRAMUSCULAR | Status: DC | PRN

## 2016-09-28 MED ORDER — FENTANYL CITRATE (PF) 250 MCG/5ML IJ SOLN
INTRAMUSCULAR | Status: AC
  Filled 2016-09-28: qty 5

## 2016-09-28 MED ORDER — ONDANSETRON HCL 4 MG/2ML IJ SOLN
INTRAMUSCULAR | Status: AC
  Administered 2016-09-28: 4 mg via INTRAVENOUS
  Filled 2016-09-28: qty 2

## 2016-09-28 MED ORDER — MENTHOL 3 MG MT LOZG: 1.0000 | LOZENGE | OROMUCOSAL | Status: DC | PRN

## 2016-09-28 MED ORDER — PHENYLEPHRINE 40 MCG/ML (10ML) SYRINGE FOR IV PUSH (FOR BLOOD PRESSURE SUPPORT)
PREFILLED_SYRINGE | INTRAVENOUS | Status: AC
  Filled 2016-09-28: qty 10

## 2016-09-28 MED ORDER — MORPHINE SULFATE (PF) 4 MG/ML IV SOLN: 0.5000 mg | INTRAVENOUS | Status: DC | PRN

## 2016-09-28 MED ORDER — SUCCINYLCHOLINE CHLORIDE 200 MG/10ML IV SOSY
PREFILLED_SYRINGE | INTRAVENOUS | Status: AC
  Filled 2016-09-28: qty 10

## 2016-09-28 MED ORDER — ONDANSETRON HCL 4 MG PO TABS: 4.0000 mg | ORAL_TABLET | Freq: Four times a day (QID) | ORAL | Status: DC | PRN

## 2016-09-28 MED ORDER — APIXABAN 2.5 MG PO TABS
2.5000 mg | ORAL_TABLET | Freq: Two times a day (BID) | ORAL | Status: DC
  Administered 2016-09-29 – 2016-09-30 (×3): 2.5 mg via ORAL
  Filled 2016-09-28 (×3): qty 1

## 2016-09-28 MED ORDER — PHENYLEPHRINE HCL 10 MG/ML IJ SOLN
INTRAMUSCULAR | Status: AC
  Filled 2016-09-28: qty 1

## 2016-09-28 MED ORDER — ROCURONIUM BROMIDE 10 MG/ML (PF) SYRINGE
PREFILLED_SYRINGE | INTRAVENOUS | Status: DC | PRN
  Administered 2016-09-28: 40 mg via INTRAVENOUS

## 2016-09-28 MED ORDER — METHOCARBAMOL 500 MG PO TABS: 500.0000 mg | ORAL_TABLET | Freq: Four times a day (QID) | ORAL | Status: DC | PRN

## 2016-09-28 MED ORDER — PROPOFOL 10 MG/ML IV BOLUS
INTRAVENOUS | Status: DC | PRN
  Administered 2016-09-28: 120 mg via INTRAVENOUS

## 2016-09-28 MED ORDER — HYDRALAZINE HCL 20 MG/ML IJ SOLN
INTRAMUSCULAR | Status: AC
  Administered 2016-09-28: 10 mg via INTRAVENOUS
  Filled 2016-09-28: qty 1

## 2016-09-28 MED ORDER — CEFAZOLIN SODIUM-DEXTROSE 2-4 GM/100ML-% IV SOLN
2.0000 g | Freq: Four times a day (QID) | INTRAVENOUS | Status: AC
  Administered 2016-09-28 – 2016-09-29 (×3): 2 g via INTRAVENOUS
  Filled 2016-09-28 (×3): qty 100

## 2016-09-28 MED ORDER — LACTATED RINGERS IV SOLN
INTRAVENOUS | Status: DC | PRN
  Administered 2016-09-28: 12:00:00 via INTRAVENOUS

## 2016-09-28 MED ORDER — ONDANSETRON HCL 4 MG/2ML IJ SOLN
INTRAMUSCULAR | Status: DC | PRN
  Administered 2016-09-28: 4 mg via INTRAVENOUS

## 2016-09-28 MED ORDER — LISINOPRIL 20 MG PO TABS
20.0000 mg | ORAL_TABLET | Freq: Every day | ORAL | Status: DC
  Administered 2016-09-28 – 2016-09-30 (×3): 20 mg via ORAL
  Filled 2016-09-28 (×3): qty 1

## 2016-09-28 MED ORDER — POTASSIUM CHLORIDE CRYS ER 20 MEQ PO TBCR
40.0000 meq | EXTENDED_RELEASE_TABLET | ORAL | Status: DC
  Administered 2016-09-29: 40 meq via ORAL
  Filled 2016-09-28 (×2): qty 2

## 2016-09-28 MED ORDER — OXYCODONE HCL 5 MG PO TABS: 5.0000 mg | ORAL_TABLET | ORAL | Status: DC | PRN

## 2016-09-28 MED ORDER — PHENYLEPHRINE 40 MCG/ML (10ML) SYRINGE FOR IV PUSH (FOR BLOOD PRESSURE SUPPORT)
PREFILLED_SYRINGE | INTRAVENOUS | Status: DC | PRN
  Administered 2016-09-28 (×3): 80 ug via INTRAVENOUS

## 2016-09-28 MED ORDER — METOCLOPRAMIDE HCL 5 MG/ML IJ SOLN
5.0000 mg | Freq: Three times a day (TID) | INTRAMUSCULAR | Status: DC | PRN
  Administered 2016-09-28: 10 mg via INTRAVENOUS
  Filled 2016-09-28: qty 2

## 2016-09-28 MED ORDER — AMIODARONE HCL 100 MG PO TABS
100.0000 mg | ORAL_TABLET | Freq: Every day | ORAL | Status: DC
  Administered 2016-09-28 – 2016-09-30 (×3): 100 mg via ORAL
  Filled 2016-09-28 (×3): qty 1

## 2016-09-28 MED ORDER — INSULIN ASPART 100 UNIT/ML ~~LOC~~ SOLN
0.0000 [IU] | Freq: Three times a day (TID) | SUBCUTANEOUS | Status: DC
  Administered 2016-09-29 – 2016-09-30 (×2): 2 [IU] via SUBCUTANEOUS
  Administered 2016-09-30: 1 [IU] via SUBCUTANEOUS

## 2016-09-28 MED ORDER — ROCURONIUM BROMIDE 10 MG/ML (PF) SYRINGE
PREFILLED_SYRINGE | INTRAVENOUS | Status: AC
  Filled 2016-09-28: qty 5

## 2016-09-28 MED ORDER — EPHEDRINE 5 MG/ML INJ
INTRAVENOUS | Status: AC
  Filled 2016-09-28: qty 10

## 2016-09-28 MED ORDER — ROSUVASTATIN CALCIUM 5 MG PO TABS
5.0000 mg | ORAL_TABLET | Freq: Every day | ORAL | Status: DC
  Administered 2016-09-28 – 2016-09-29 (×2): 5 mg via ORAL
  Filled 2016-09-28 (×2): qty 1

## 2016-09-28 MED ORDER — METOCLOPRAMIDE HCL 5 MG PO TABS: 5.0000 mg | ORAL_TABLET | Freq: Three times a day (TID) | ORAL | Status: DC | PRN

## 2016-09-28 SURGICAL SUPPLY — 45 items
BAG DECANTER FOR FLEXI CONT (MISCELLANEOUS) ×3 IMPLANT
CAPT HIP HEMI 2 ×3 IMPLANT
CELLS DAT CNTRL 66122 CELL SVR (MISCELLANEOUS) ×1 IMPLANT
COVER SURGICAL LIGHT HANDLE (MISCELLANEOUS) ×3 IMPLANT
DRAPE C-ARM 42X72 X-RAY (DRAPES) ×3 IMPLANT
DRAPE IMP U-DRAPE 54X76 (DRAPES) ×9 IMPLANT
DRAPE STERI IOBAN 125X83 (DRAPES) ×3 IMPLANT
DRAPE U-SHAPE 47X51 STRL (DRAPES) ×6 IMPLANT
DRSG AQUACEL AG ADV 3.5X10 (GAUZE/BANDAGES/DRESSINGS) ×3 IMPLANT
DRSG MEPILEX BORDER 4X8 (GAUZE/BANDAGES/DRESSINGS) ×3 IMPLANT
DURAPREP 26ML APPLICATOR (WOUND CARE) ×3 IMPLANT
ELECT BLADE 4.0 EZ CLEAN MEGAD (MISCELLANEOUS) ×3
ELECT REM PT RETURN 9FT ADLT (ELECTROSURGICAL) ×3
ELECTRODE BLDE 4.0 EZ CLN MEGD (MISCELLANEOUS) ×1 IMPLANT
ELECTRODE REM PT RTRN 9FT ADLT (ELECTROSURGICAL) ×1 IMPLANT
FACESHIELD WRAPAROUND (MASK) ×9 IMPLANT
GLOVE SKINSENSE NS SZ7.5 (GLOVE) ×2
GLOVE SKINSENSE STRL SZ7.5 (GLOVE) ×1 IMPLANT
GLOVE SURG SYN 7.5  E (GLOVE) ×8
GLOVE SURG SYN 7.5 E (GLOVE) ×4 IMPLANT
GOWN SRG XL XLNG 56XLVL 4 (GOWN DISPOSABLE) ×1 IMPLANT
GOWN STRL NON-REIN XL XLG LVL4 (GOWN DISPOSABLE) ×2
GOWN STRL REUS W/ TWL LRG LVL3 (GOWN DISPOSABLE) IMPLANT
GOWN STRL REUS W/TWL LRG LVL3 (GOWN DISPOSABLE)
HANDPIECE INTERPULSE COAX TIP (DISPOSABLE) ×2
HOOD PEEL AWAY FLYTE STAYCOOL (MISCELLANEOUS) ×3 IMPLANT
IV NS IRRIG 3000ML ARTHROMATIC (IV SOLUTION) ×3 IMPLANT
KIT BASIN OR (CUSTOM PROCEDURE TRAY) ×3 IMPLANT
MARKER SKIN DUAL TIP RULER LAB (MISCELLANEOUS) ×3 IMPLANT
PACK TOTAL JOINT (CUSTOM PROCEDURE TRAY) ×3 IMPLANT
PACK UNIVERSAL I (CUSTOM PROCEDURE TRAY) ×3 IMPLANT
RTRCTR WOUND ALEXIS 18CM MED (MISCELLANEOUS) ×3
SAW OSC TIP CART 19.5X105X1.3 (SAW) ×3 IMPLANT
SET HNDPC FAN SPRY TIP SCT (DISPOSABLE) ×1 IMPLANT
STAPLER VISISTAT 35W (STAPLE) ×3 IMPLANT
SUT ETHIBOND 2 V 37 (SUTURE) ×3 IMPLANT
SUT VIC AB 0 CT1 27 (SUTURE) ×2
SUT VIC AB 0 CT1 27XBRD ANBCTR (SUTURE) ×1 IMPLANT
SUT VIC AB 1 CT1 27 (SUTURE) ×2
SUT VIC AB 1 CT1 27XBRD ANBCTR (SUTURE) ×1 IMPLANT
SUT VIC AB 2-0 CT1 27 (SUTURE) ×2
SUT VIC AB 2-0 CT1 TAPERPNT 27 (SUTURE) ×1 IMPLANT
TOWEL OR 17X26 10 PK STRL BLUE (TOWEL DISPOSABLE) ×3 IMPLANT
TRAY CATH 16FR W/PLASTIC CATH (SET/KITS/TRAYS/PACK) ×3 IMPLANT
YANKAUER SUCT BULB TIP NO VENT (SUCTIONS) ×3 IMPLANT

## 2016-09-28 NOTE — Discharge Instructions (Signed)
    1. Change dressings as needed 2. May shower but keep incisions covered and dry 3. Take eliquis to prevent blood clots 4. Take stool softeners as needed 5. Take pain meds as needed   

## 2016-09-28 NOTE — Anesthesia Postprocedure Evaluation (Signed)
Anesthesia Post Note  Patient: Cynthia Wright  Procedure(s) Performed: Procedure(s) (LRB): ANTERIOR APPROACH HEMI HIP ARTHROPLASTY (Right)     Patient location during evaluation: PACU Anesthesia Type: General Level of consciousness: awake, awake and alert and oriented Pain management: pain level controlled Vital Signs Assessment: post-procedure vital signs reviewed and stable Respiratory status: spontaneous breathing, nonlabored ventilation and respiratory function stable Cardiovascular status: blood pressure returned to baseline Anesthetic complications: no    Last Vitals:  Vitals:   09/28/16 1615 09/28/16 2026  BP: (!) 143/47 111/82  Pulse: (!) 101 (!) 142  Resp: 20   Temp: 36.4 C 36.5 C    Last Pain:  Vitals:   09/28/16 2026  TempSrc: Axillary  PainSc:     LLE Motor Response: Purposeful movement;Responds to commands (09/28/16 2033) LLE Sensation: Full sensation (09/28/16 2033) RLE Motor Response: Purposeful movement;Responds to commands (09/28/16 2033) RLE Sensation: Full sensation (09/28/16 2033)      Montell Leopard COKER

## 2016-09-28 NOTE — Anesthesia Preprocedure Evaluation (Signed)
Anesthesia Evaluation  Patient identified by MRN, date of birth, ID band Patient awake    Reviewed: Allergy & Precautions, NPO status , Patient's Chart, lab work & pertinent test results  Airway Mallampati: II  TM Distance: >3 FB Neck ROM: Full    Dental  (+) Teeth Intact, Dental Advisory Given   Pulmonary former smoker,    breath sounds clear to auscultation       Cardiovascular hypertension,  Rhythm:Regular Rate:Normal     Neuro/Psych    GI/Hepatic   Endo/Other  diabetes  Renal/GU      Musculoskeletal   Abdominal   Peds  Hematology   Anesthesia Other Findings   Reproductive/Obstetrics                             Anesthesia Physical Anesthesia Plan  ASA: III  Anesthesia Plan: General   Post-op Pain Management:    Induction: Intravenous  PONV Risk Score and Plan: 1 and Ondansetron and Dexamethasone  Airway Management Planned: Oral ETT  Additional Equipment:   Intra-op Plan:   Post-operative Plan: Extubation in OR  Informed Consent: I have reviewed the patients History and Physical, chart, labs and discussed the procedure including the risks, benefits and alternatives for the proposed anesthesia with the patient or authorized representative who has indicated his/her understanding and acceptance.   Dental advisory given  Plan Discussed with: CRNA and Anesthesiologist  Anesthesia Plan Comments:         Anesthesia Quick Evaluation

## 2016-09-28 NOTE — Progress Notes (Signed)
[facility] PROGRESS NOTE  Cynthia Wright CZY:[id number] DOB: Jan 26, 1925 DOA: 09/27/2016 PCP: Aretta Nip, MD  Interim summary and HPI 83 y/o female with hx of HTN, HLD, atrial fibrillation (on eliquis) and chronic diastolic HF; who presented to ED after mechanical fall and found with right hip fracture.  Assessment/Plan: 1-displaced subcapital fracture of the right femoral neck: -continue supportive care -follow Hgb trend after surgery -follow orthopedic service post operative rec's -Eliquis for DVT prophylaxis -will follow PT rec's  2-HTN -continue lisinopril and PRN hydralazine  3-HLD -will continue crestor  4-Atrial Fibrillation  -rate controlled -will continue pacerone -Eliquis resume for anticoagulation purposes  5-diabetes mellitus -will use SSI -will check A1C -holding oral hypoglycemic regimen   6-E. Coli UTI -no dysuria -no fever -will continue empiric treatment with cipro  7-chronic diastolic HF -compensated -follow daily weights and strict intake/output  -last EF 65% -continue heart healthy diet   Code Status: full code Family Communication: no family bedside  Disposition Plan: continue supportive care and analgesics; resume Eliquis as indicated by ortho. Follow post-operative instructions. Will most likely need SNF at discharge.   Consultants:  Orthopedic service   Procedures:  Right femoral neck hip fracture   Antibiotics:  Ciprofloxacin   HPI/Subjective: Afebrile, no CP. Sleepy after surgery. In no acute distress. Denies palpitations.  Objective: Vitals:   09/28/16 1549 09/28/16 1615  BP: (!) 142/42 (!) 143/47  Pulse: 89 (!) 101  Resp: 20 20  Temp:  97.5 F (36.4 C)    Intake/Output Summary (Last 24 hours) at 09/28/16 1718 Last data filed at 09/28/16 1545  Gross per 24 hour  Intake             1000 ml  Output              500 ml  Net              500 ml   There were no vitals filed for this  visit.  Exam:   General: afebrile, no CP, no SOB, no nausea, no vomiting. Reports pain is mild.  Cardiovascular: no JVD, no rubs, no gallops, rate controlled.  Respiratory: no using accessory muscles, no wheezing, no crackles   Abdomen: soft, NT, ND, positive BS  Musculoskeletal: no cyanosis, no clubbing; decrease range of motion of right hip.  Data Reviewed: Basic Metabolic Panel:  Recent Labs Lab 09/27/16 1240  NA 142  K 4.2  CL 109  CO2 25  GLUCOSE 107*  BUN 27*  CREATININE 1.15*  CALCIUM 9.3   Liver Function Tests:  Recent Labs Lab 09/27/16 1240  AST 20  ALT 22  ALKPHOS 91  BILITOT 0.7  PROT 6.5  ALBUMIN 3.6   CBC:  Recent Labs Lab 09/27/16 1240  WBC 8.1  NEUTROABS 6.4  HGB 13.0  HCT 39.1  MCV 100.0  PLT 202   Cardiac Enzymes:  Recent Labs Lab 09/27/16 1240  CKTOTAL 56   CBG:  Recent Labs Lab 09/28/16 1210 09/28/16 1425  GLUCAP 123* 149*    Recent Results (from the past 240 hour(s))  Urine culture     Status: Abnormal (Preliminary result)   Collection Time: 09/27/16 10:39 AM  Result Value Ref Range Status   Specimen Description URINE, CLEAN CATCH  Final   Special Requests NONE  Final   Culture >=100,000 COLONIES/mL ESCHERICHIA COLI (A)  Final   Report Status PENDING  Incomplete  Surgical pcr screen     Status: None   Collection  Time: 09/28/16  1:33 AM  Result Value Ref Range Status   MRSA, PCR NEGATIVE NEGATIVE Final   Staphylococcus aureus NEGATIVE NEGATIVE Final    Comment:        The Xpert SA Assay (FDA approved for NASAL specimens in patients over 63 years of age), is one component of a comprehensive surveillance program.  Test performance has been validated by [facility] for patients greater than or equal to 36 year old. It is not intended to diagnose infection nor to guide or monitor treatment.      Studies: Dg Pelvis 1-2 Views  Result Date: 09/27/2016 CLINICAL DATA:  Right hip and pelvic pain.  Recent  pelvic fracture. EXAM: PELVIS - 1-2 VIEW COMPARISON:  07/21/2016 FINDINGS: Acute subcapital fracture of the right femoral neck is seen which shows mild displacement. No evidence of dislocation. Healing subacute fractures of the right superior and inferior pubic rami are seen, which show increased healing response since prior study. Generalized osteopenia noted. IMPRESSION: Mildly displaced subcapital fracture of the right femoral neck. Healing subacute fractures of right superior and inferior pubic rami. Electronically Signed   By: Earle Gell M.D.   On: 09/27/2016 12:15   Pelvis Portable  Result Date: 09/28/2016 CLINICAL DATA:  Right hip replacement surgery. EXAM: PORTABLE PELVIS 1-2 VIEWS COMPARISON:  CT scan 09/28/2014 FINDINGS: There is a bipolar type hip prosthesis on the right side which appears to be in good position without complicating features. No periprosthetic fracture. Stable healing right-sided superior and inferior pubic rami fractures. IMPRESSION: Well seated bipolar type right hip prosthesis without complicating features. Electronically Signed   By: Marijo Sanes M.D.   On: 09/28/2016 15:15   Ct Hip Right Wo Contrast  Result Date: 09/27/2016 CLINICAL DATA:  Evaluate subcapital fracture. EXAM: CT OF THE RIGHT HIP WITHOUT CONTRAST TECHNIQUE: Multidetector CT imaging of the right hip was performed according to the standard protocol. Multiplanar CT image reconstructions were also generated. COMPARISON:  Radiographs 09/27/2016 hand CT scan 07/11/2016 FINDINGS: There is a wound mildly impacted and slightly displaced subcapital fracture of the right hip. There is mild internal rotation of the femoral head and slight lateral displacement of the femoral neck, giving a slight varus deformity. Moderate right hip joint degenerative changes with joint space narrowing and spurring. The acetabulum is intact. No fracture. There are partially united superior and inferior pubic rami fractures on the right  side. Exuberant callus formation. The visualized right SI joint appears normal. The surrounding hip and pelvic musculature are grossly normal. No significant intrapelvic abnormalities on the right side. IMPRESSION: 1. Mildly impacted and slightly displaced subcapital fracture of the right hip as described above. 2. Partially united superior and inferior pubic rami fractures on the right. Electronically Signed   By: Marijo Sanes M.D.   On: 09/27/2016 15:06   Dg C-arm 1-60 Min  Result Date: 09/28/2016 CLINICAL DATA:  Right hip hemiarthroplasty. EXAM: OPERATIVE right HIP (WITH PELVIS IF PERFORMED) 2 VIEWS TECHNIQUE: Fluoroscopic spot image(s) were submitted for interpretation post-operatively. COMPARISON:  09/27/2016. FINDINGS: Bipolar hip arthroplasty on the right. Components are well-positioned. No radiographically detectable complication. Healing rami fractures again demonstrated. IMPRESSION: Good appearance following bipolar hip arthroplasty on the right. Electronically Signed   By: Nelson Chimes M.D.   On: 09/28/2016 14:28   Dg Hip Operative Unilat W Or W/o Pelvis Right  Result Date: 09/28/2016 CLINICAL DATA:  Right hip hemiarthroplasty. EXAM: OPERATIVE right HIP (WITH PELVIS IF PERFORMED) 2 VIEWS TECHNIQUE: Fluoroscopic spot image(s) were  submitted for interpretation post-operatively. COMPARISON:  09/27/2016. FINDINGS: Bipolar hip arthroplasty on the right. Components are well-positioned. No radiographically detectable complication. Healing rami fractures again demonstrated. IMPRESSION: Good appearance following bipolar hip arthroplasty on the right. Electronically Signed   By: Nelson Chimes M.D.   On: 09/28/2016 14:28   Dg Femur Min 2 Views Left  Result Date: 09/27/2016 CLINICAL DATA:  Fall.  Left hip and pelvic pain.  Initial encounter. EXAM: LEFT FEMUR 2 VIEWS COMPARISON:  None. FINDINGS: There is no evidence of fracture or other focal bone lesions. Mild peripheral vascular calcification noted.  IMPRESSION: No acute findings. Electronically Signed   By: Earle Gell M.D.   On: 09/27/2016 12:16   Dg Femur Min 2 Views Right  Result Date: 09/27/2016 CLINICAL DATA:  Fall.  Right hip pain.  Initial encounter. EXAM: RIGHT FEMUR 2 VIEWS COMPARISON:  None. FINDINGS: Mildly displaced subcapital fracture right femoral neck is seen. No other femur fracture identified. No evidence of hip dislocation. Mild peripheral vascular calcification noted. IMPRESSION: Mildly displaced subcapital right femoral neck fracture. Electronically Signed   By: Earle Gell M.D.   On: 09/27/2016 12:18    Scheduled Meds: . amiodarone  100 mg Oral Daily  . [START ON 09/29/2016] apixaban  2.5 mg Oral BID  . ciprofloxacin  250 mg Oral BID  . lisinopril  20 mg Oral Daily  . [START ON 09/29/2016] potassium chloride SA  40 mEq Oral QODAY  . rosuvastatin  5 mg Oral QHS   Continuous Infusions: . sodium chloride 125 mL/hr at 09/28/16 1640  .  ceFAZolin (ANCEF) IV    . methocarbamol (ROBAXIN)  IV    . tranexamic acid      Active Problems:   Fracture of left leg   Closed displaced fracture of right femoral neck (HCC)    Time spent: 25 minutes    Barton Dubois  [facility] Pager [phone]. If 7PM-7AM, please contact night-coverage at [email or web address], password [facility] 09/28/2016, 5:18 PM  LOS: 1 day

## 2016-09-28 NOTE — Anesthesia Procedure Notes (Signed)
Procedure Name: Intubation Date/Time: 09/28/2016 12:23 PM Performed by: Myna Bright Pre-anesthesia Checklist: Patient identified, Emergency Drugs available, Suction available and Patient being monitored Patient Re-evaluated:Patient Re-evaluated prior to inductionOxygen Delivery Method: Circle system utilized Preoxygenation: Pre-oxygenation with 100% oxygen Intubation Type: IV induction Ventilation: Mask ventilation without difficulty Laryngoscope Size: Mac and 4 Grade View: Grade III Tube type: Oral Tube size: 7.5 mm Number of attempts: 2 Airway Equipment and Method: Bougie stylet Placement Confirmation: ETT inserted through vocal cords under direct vision,  positive ETCO2 and breath sounds checked- equal and bilateral Secured at: 21 cm Tube secured with: Tape Dental Injury: Teeth and Oropharynx as per pre-operative assessment  Difficulty Due To: Difficult Airway- due to anterior larynx Comments: Easy mask airway. DL x 1 with MAC 4 blade. View of epiglottis only. Unable to view cords. Passed bougie stylet through cords without difficulty, tracheal ridges appreciated. AOI. +EtCO2, +BBS.

## 2016-09-28 NOTE — Op Note (Signed)
ANTERIOR APPROACH HEMI HIP ARTHROPLASTY  Procedure Note Cynthia Wright   [id number]  Pre-op Diagnosis: hip fx     Post-op Diagnosis: same   Operative Procedures  1. Prosthetic replacement for femoral neck fracture. CPT [phone]  Personnel  Surgeon(s): Leandrew Koyanagi, MD   Anesthesia: general  Prosthesis: depuy Femur: corail ka 12 Head: 47 mm size: +1.5 Bearing Type: bipolar  Hip Hemiarthroplasty (Anterior Approach) Op Note:  After informed consent was obtained and the operative extremity marked in the holding area, the patient was brought back to the operating room and placed supine on the HANA table. Next, the operative extremity was prepped and draped in normal sterile fashion. Surgical timeout occurred verifying patient identification, surgical site, surgical procedure and administration of antibiotics.  A modified anterior Smith-Peterson approach to the hip was performed, using the interval between tensor fascia lata and sartorius.  Dissection was carried bluntly down onto the anterior hip capsule. The lateral femoral circumflex vessels were identified and coagulated. A capsulotomy was performed and the capsular flaps tagged for later repair.  Fluoroscopy was utilized to prepare for the femoral neck cut. The neck osteotomy was performed. The femoral head was removed and found a 47 mm head was the appropriate fit.    We then turned our attention to the femur.  After placing the femoral hook, the leg was taken to externally rotated, extended and adducted position taking care to perform soft tissue releases to allow for adequate mobilization of the femur. Soft tissue was cleared from the shoulder of the greater trochanter and the hook elevator used to improve exposure of the proximal femur. Sequential broaching performed up to a size 12. Trial neck and head were placed. The leg was brought back up to neutral and the construct reduced. The position and sizing of components, offset and leg  lengths were checked using fluoroscopy. Stability of the construct was checked in extension and external rotation without any subluxation or impingement of prosthesis. We dislocated the prosthesis, dropped the leg back into position, removed trial components, and irrigated copiously. The final stem and head was then placed, the leg brought back up, the system reduced and fluoroscopy used to verify positioning.  We irrigated, obtained hemostasis and closed the capsule using #2 ethibond suture.  The fascia was closed with #1 vicryl plus, the deep fat layer was closed with 0 vicryl, the subcutaneous layers closed with 2.0 Vicryl Plus and the skin closed with staples. A sterile dressing was applied. The patient was awakened in the operating room and taken to recovery in stable condition. All sponge, needle, and instrument counts were correct at the end of the case.   Position: supine  Complications: none.  Time Out: performed   Drains/Packing: none  Estimated blood loss: 150 cc  Returned to Recovery Room: in good condition.   Antibiotics: yes   Mechanical VTE (DVT) Prophylaxis: sequential compression devices, TED thigh-high  Chemical VTE (DVT) Prophylaxis: eliquis  Fluid Replacement: Crystalloid: see anesthesia record  Specimens Removed: 1 to pathology   Sponge and Instrument Count Correct? yes   PACU: portable radiograph - low AP   Admission: inpatient status, start PT & OT POD#1  Plan/RTC: Return in 2 weeks for staple removal. Return in 6 weeks to see MD.  Weight Bearing/Load Lower Extremity: full  Hip precautions: none Suture Removal: 10-14 days  Betadine to incision twice daily once dressing is removed on POD#7  N. Eduard Roux, MD [place] 2:13 PM  Implant Name Type Inv. Item Serial No. Manufacturer Lot No. LRB No. Used  HIP BALL ARTICU DEPUY - [id number] Hips HIP BALL ARTICU DEPUY  DEPUY SYNTHES [id number] Right 1  BIPOLAR DEPUY 47MM - [id number]  Hips BIPOLAR DEPUY 47MM  DEPUY SYNTHES [id number] Right 1  STEM CORAIL KA12 - [id number] Stem STEM CORAIL KA12   DEPUY SYNTHES [id number] Right 1

## 2016-09-28 NOTE — Transfer of Care (Signed)
Immediate Anesthesia Transfer of Care Note  Patient: Cynthia Wright  Procedure(s) Performed: Procedure(s): ANTERIOR APPROACH HEMI HIP ARTHROPLASTY (Right)  Patient Location: PACU  Anesthesia Type:General  Level of Consciousness: drowsy and responds to stimulation  Airway & Oxygen Therapy: Patient Spontanous Breathing and Patient connected to nasal cannula oxygen  Post-op Assessment: Report given to RN and Post -op Vital signs reviewed and stable  Post vital signs: Reviewed and stable  Last Vitals:  Vitals:   09/28/16 0128 09/28/16 0619  BP: (!) 195/62 (!) 187/63  Pulse: 63 75  Resp:    Temp:  36.6 C    Last Pain:  Vitals:   09/28/16 0619  TempSrc: Oral  PainSc:       Patients Stated Pain Goal: 0 (50/03/70 4888)  Complications: No apparent anesthesia complications

## 2016-09-29 ENCOUNTER — Encounter (HOSPITAL_COMMUNITY): Payer: Self-pay | Admitting: Orthopaedic Surgery

## 2016-09-29 DIAGNOSIS — D649 Anemia, unspecified: Secondary | ICD-10-CM

## 2016-09-29 LAB — URINE CULTURE

## 2016-09-29 LAB — BASIC METABOLIC PANEL
ANION GAP: 10 (ref 5–15)
BUN: 30 mg/dL — AB (ref 6–20)
CHLORIDE: 105 mmol/L (ref 101–111)
CO2: 22 mmol/L (ref 22–32)
Calcium: 8.6 mg/dL — ABNORMAL LOW (ref 8.9–10.3)
Creatinine, Ser: 1.24 mg/dL — ABNORMAL HIGH (ref 0.44–1.00)
GFR calc Af Amer: 43 mL/min — ABNORMAL LOW (ref >60)
GFR, EST NON AFRICAN AMERICAN: 37 mL/min — AB (ref >60)
GLUCOSE: 171 mg/dL — AB (ref 65–99)
POTASSIUM: 3.3 mmol/L — AB (ref 3.5–5.1)
Sodium: 137 mmol/L (ref 135–145)

## 2016-09-29 LAB — CBC
HCT: 37.1 % (ref 36.0–46.0)
HEMOGLOBIN: 11.9 g/dL — AB (ref 12.0–15.0)
MCH: 32.3 pg (ref 26.0–34.0)
MCHC: 32.1 g/dL (ref 30.0–36.0)
MCV: 100.8 fL — AB (ref 78.0–100.0)
PLATELETS: 228 10*3/uL (ref 150–400)
RBC: 3.68 MIL/uL — AB (ref 3.87–5.11)
RDW: 13.6 % (ref 11.5–15.5)
WBC: 13.2 10*3/uL — AB (ref 4.0–10.5)

## 2016-09-29 LAB — GLUCOSE, CAPILLARY
GLUCOSE-CAPILLARY: 146 mg/dL — AB (ref 65–99)
GLUCOSE-CAPILLARY: 167 mg/dL — AB (ref 65–99)
Glucose-Capillary: 170 mg/dL — ABNORMAL HIGH (ref 65–99)
Glucose-Capillary: 166 mg/dL — ABNORMAL HIGH (ref 65–99)

## 2016-09-29 MED ORDER — OXYCODONE HCL 5 MG PO TABS
5.0000 mg | ORAL_TABLET | Freq: Four times a day (QID) | ORAL | Status: DC | PRN
  Administered 2016-09-30: 10 mg via ORAL
  Filled 2016-09-29: qty 2

## 2016-09-29 MED ORDER — RISPERIDONE 0.5 MG PO TABS
0.2500 mg | ORAL_TABLET | Freq: Every day | ORAL | Status: DC
  Administered 2016-09-29: 0.25 mg via ORAL
  Filled 2016-09-29: qty 1

## 2016-09-29 MED ORDER — HALOPERIDOL LACTATE 5 MG/ML IJ SOLN: 1.0000 mg | Freq: Once | INTRAMUSCULAR | Status: DC

## 2016-09-29 MED ORDER — MORPHINE SULFATE (PF) 4 MG/ML IV SOLN: 1.0000 mg | INTRAVENOUS | Status: DC | PRN

## 2016-09-29 NOTE — Progress Notes (Signed)
Night RN reports patient pulled out her foley cath. At 0500; has had increased confusion rising this morning; agitated and thinks the staff are going to hurt her per patient words; patient has pulled IV out threw the night; saline lock replaced and wrapped for access; patient due to void; patient pushed breakfast tray off her table; remains confused and agitated; Dr.Madera notified; calling family to assist with patient orientation; reached son, Shanon Brow is on his way to the hospital. Patient safety maintained.

## 2016-09-29 NOTE — Progress Notes (Signed)
Patient more oriented presently since son has arrived; cooperative with staff presently; oriented to persons and place; not to time. Working with therapies presently; oob to chair.

## 2016-09-29 NOTE — Progress Notes (Signed)
Patient becoming increasing confused again; climbing out of bed and got up out of recliner; agitated; confused;safety sitter requested and MD called; patient responding to staff member at bedside; more cooperative;remains confused to time and situation.

## 2016-09-29 NOTE — Evaluation (Signed)
Occupational Therapy Evaluation Patient Details Name: Cynthia Wright MRN: [id number] DOB: Oct 18, 1924 Today's Date: 09/29/2016    History of Present Illness KERI VEALE a 83 y.o.femalewho presents with worsening right hip pain with inability to ambulate.Xray showes displaced femoral neck fracture. Pt now s/p R anterior apporach hip hemiarthroplasty.   Clinical Impression   PTA, pt was living at home alone and performing ADLs; pt was receiving recent 24/7 support due to hip pain. Currently, pt requires Max A LB ADLs and Mod A +2 for functional mobility with RW. Pt would benefit from acute OT to increase her occupational performance and participation. Recommend dc to SNF for further OT to increase pt safety and independence with ADLs and functional mobility.     Follow Up Recommendations  SNF;Supervision/Assistance - 24 hour    Equipment Recommendations  Other (comment) (Defer to next venue)    Recommendations for Other Services PT consult     Precautions / Restrictions Precautions Precautions: Fall Precaution Comments: pt with severe dementia, unaware of surgery Restrictions Weight Bearing Restrictions: Yes RLE Weight Bearing: Weight bearing as tolerated      Mobility Bed Mobility Overal bed mobility: Needs Assistance Bed Mobility: Supine to Sit     Supine to sit: Max assist;HOB elevated     General bed mobility comments: Pt at EOB with PT upon arrival  Transfers Overall transfer level: Needs assistance Equipment used: Rolling walker (2 wheeled) Transfers: Sit to/from [facility] Sit to Stand: Mod assist Stand pivot transfers: Mod assist;+2 safety/equipment       General transfer comment: due to dementia provided less physical assist and let pt use her normal technique. PT and OT held down RW and pt pulled up on it, modA given to achieve full upright posture, min/modA for stand pvt for walker management and safety during std pvt     Balance Overall balance assessment: Needs assistance Sitting-balance support: Feet supported;No upper extremity supported Sitting balance-Leahy Scale: Fair     Standing balance support: During functional activity Standing balance-Leahy Scale: Poor Standing balance comment: needs RW and physical assist                           ADL either performed or assessed with clinical judgement   ADL Overall ADL's : Needs assistance/impaired Eating/Feeding: Set up;Sitting   Grooming: Oral care;Set up;Sitting   Upper Body Bathing: Minimal assistance;Sitting   Lower Body Bathing: Sit to/from stand;Maximal assistance   Upper Body Dressing : Minimal assistance;Sitting   Lower Body Dressing: Sit to/from stand;Maximal assistance   Toilet Transfer: Maximal assistance;Stand-pivot;BSC             General ADL Comments: Pt limited by increased confusion. Requires Mod-Max VCs throughout session to sequence task and follow commands     Vision         Perception     Praxis      Pertinent Vitals/Pain Pain Assessment: Faces Faces Pain Scale: Hurts even more Pain Location: R hip with movement, no sign of discomfort at rest Pain Descriptors / Indicators: Discomfort;Grimacing;Guarding Pain Intervention(s): Monitored during session;Limited activity within patient's tolerance;Repositioned     Hand Dominance Right   Extremity/Trunk Assessment Upper Extremity Assessment Upper Extremity Assessment: Generalized weakness   Lower Extremity Assessment Lower Extremity Assessment: Defer to PT evaluation RLE Deficits / Details: pt able to initiate LAQ, grimaced with movement however   Cervical / Trunk Assessment Cervical / Trunk Assessment: Kyphotic   Communication Communication  Communication: No difficulties   Cognition Arousal/Alertness: Awake/alert Behavior During Therapy: Restless (very fidgety) Overall Cognitive Status: History of cognitive impairments - at baseline Area  of Impairment: Attention;Memory;Problem solving;Following commands                   Current Attention Level: Sustained Memory: Decreased short-term memory Following Commands: Follows one step commands inconsistently;Follows one step commands with increased time     Problem Solving: Slow processing;Decreased initiation;Difficulty sequencing;Requires verbal cues General Comments: dementia, became paranoid and perseverative but pleasant and compliant. Family communicating this level of confusion is not baseline   General Comments  family stepped out for therapy session. Pt family very concerned about pt's level of confusion    Exercises     Shoulder Instructions      Home Living Family/patient expects to be discharged to:: Skilled nursing facility Living Arrangements: Alone                               Additional Comments: was living at home by herself and performing her ADLs. Recent 24/7 assist due to hip pain      Prior Functioning/Environment Level of Independence: Independent with assistive device(s)        Comments: used RW, was driving, cleaning, cooking        OT Problem List: Decreased strength;Decreased range of motion;Decreased activity tolerance;Impaired balance (sitting and/or standing);Decreased cognition;Decreased safety awareness;Decreased knowledge of use of DME or AE;Decreased knowledge of precautions;Pain      OT Treatment/Interventions: Self-care/ADL training;Therapeutic exercise;Energy conservation;DME and/or AE instruction;Therapeutic activities;Patient/family education    OT Goals(Current goals can be found in the care plan section) Acute Rehab OT Goals Patient Stated Goal: didn't state OT Goal Formulation: With patient Time For Goal Achievement: 10/13/16 Potential to Achieve Goals: Good  OT Frequency: Min 2X/week   Barriers to D/C:            Co-evaluation              AM-PAC PT "6 Clicks" Daily Activity      Outcome Measure Help from another person eating meals?: A Little Help from another person taking care of personal grooming?: A Little Help from another person toileting, which includes using toliet, bedpan, or urinal?: A Lot Help from another person bathing (including washing, rinsing, drying)?: A Lot Help from another person to put on and taking off regular upper body clothing?: A Little Help from another person to put on and taking off regular lower body clothing?: A Lot 6 Click Score: 15   End of Session Equipment Utilized During Treatment: Gait belt;Rolling walker Nurse Communication: Mobility status  Activity Tolerance: Patient tolerated treatment well (Limited by confusion and decreased cogntition) Patient left: in chair;with call bell/phone within reach;with chair alarm set;with nursing/sitter in room;with family/visitor present  OT Visit Diagnosis: Unsteadiness on feet (R26.81);Other abnormalities of gait and mobility (R26.89);Muscle weakness (generalized) (M62.81);Pain Pain - Right/Left: Right Pain - part of body: Hip;Leg                Time: 0953-1010 OT Time Calculation (min): 17 min Charges:  OT General Charges $OT Visit: 1 Procedure OT Evaluation $OT Eval Low Complexity: 1 Procedure G-Codes:     [place], OTR/L Acute Rehab Pager: [phone] Office: [place] 09/29/2016, 1:14 PM

## 2016-09-29 NOTE — Progress Notes (Signed)
[facility] PROGRESS NOTE  REVE CROCKET ZOX:[id number] DOB: 12-22-1924 DOA: 09/27/2016 PCP: Aretta Nip, MD  Interim summary and HPI 83 y/o female with hx of HTN, HLD, atrial fibrillation (on eliquis) and chronic diastolic HF; who presented to ED after mechanical fall and found with right hip fracture.  Assessment/Plan: 1-displaced subcapital fracture of the right femoral neck: -continue supportive care -follow Hgb tred; mild anemia post operative procedure  -follow orthopedic service post operative rec's -continue Eliquis for DVT prophylaxis -per PT rec's, needs SNF at discharge -SW aware, will follow bed availability   2-HTN -BP stable -continue lisinopril and PRN hydralazine  3-HLD -continue statins home dose (crestor)  4-Atrial Fibrillation  -rate has remained controlled -continue pacerone and continue eliquis  5-diabetes mellitus -continue SSI while inpatient  -follow A1C -continue holding oral hypoglycemic regimen   6-E. Coli UTI -patient is afebrile and with normal WBC's -microorganism sensitive to ciprofloxacin  -will continue oral antibiotics. (plan is to treat for 5 days)  7-chronic diastolic HF -stable and has remained compensated -continue checking daily weights and strict I's and O's   -last measure EF, was 65% -continue heart healthy diet   8-patient with sundowning/hospital delirium/agitation and underlying dementia -continue supportive care -will minimise sedative agents and pain killers -will start very low dose risperdal -follow response   Code Status: full code Family Communication: no family bedside  Disposition Plan: continue supportive care and PRN analgesics; continue Eliquis as now clear by orthopedic service.  Follow post-operative instructions. Will need SNF at discharge.    Consultants:  Orthopedic service   Procedures:  Right femoral neck hip fracture   Antibiotics:  Ciprofloxacin    HPI/Subjective: Afebrile, no CP, no SOB. Patient is confused and experiencing intermittent episodes of agitation.  Objective: Vitals:   09/29/16 0451 09/29/16 1416  BP: (!) 135/54 (!) 131/44  Pulse: 78 75  Resp:  18  Temp: 97.6 F (36.4 C) 97.2 F (36.2 C)    Intake/Output Summary (Last 24 hours) at 09/29/16 1743 Last data filed at 09/29/16 1300  Gross per 24 hour  Intake           672.08 ml  Output              750 ml  Net           -77.92 ml   Filed Weights   09/29/16 1100  Weight: 60 kg (132 lb 4.4 oz)    Exam:   General: patient is very confused and agitated with staff. No CP, no SOB, reporting pain in her leg is well controlled. No nausea, no vomiting.   Cardiovascular: no JVD, no rubs, no gallops, rate controlled. Positive SEM.  Respiratory: no tachypnea, no using accessory muscles and with normal resp effort. No wheezing or crackles.    Abdomen: soft, NT, ND, positive BS  Musculoskeletal: no cyanosis or clubbing. Right leg with decrease ROM. No cyanosis or clubbing.  Data Reviewed: Basic Metabolic Panel:  Recent Labs Lab 09/27/16 1240 09/29/16 0417  NA 142 137  K 4.2 3.3*  CL 109 105  CO2 25 22  GLUCOSE 107* 171*  BUN 27* 30*  CREATININE 1.15* 1.24*  CALCIUM 9.3 8.6*   Liver Function Tests:  Recent Labs Lab 09/27/16 1240  AST 20  ALT 22  ALKPHOS 91  BILITOT 0.7  PROT 6.5  ALBUMIN 3.6   CBC:  Recent Labs Lab 09/27/16 1240 09/29/16 0417  WBC 8.1 13.2*  NEUTROABS 6.4  --  HGB 13.0 11.9*  HCT 39.1 37.1  MCV 100.0 100.8*  PLT 202 228   Cardiac Enzymes:  Recent Labs Lab 09/27/16 1240  CKTOTAL 56   CBG:  Recent Labs Lab 09/28/16 1425 09/28/16 2237 09/29/16 0802 09/29/16 1106 09/29/16 1629  GLUCAP 149* 198* 146* 170* 167*    Recent Results (from the past 240 hour(s))  Urine culture     Status: Abnormal   Collection Time: 09/27/16 10:39 AM  Result Value Ref Range Status   Specimen Description URINE, CLEAN CATCH   Final   Special Requests NONE  Final   Culture >=100,000 COLONIES/mL ESCHERICHIA COLI (A)  Final   Report Status 09/29/2016 FINAL  Final   Organism ID, Bacteria ESCHERICHIA COLI (A)  Final      Susceptibility   Escherichia coli - MIC*    AMPICILLIN 8 SENSITIVE Sensitive     CEFAZOLIN <=4 SENSITIVE Sensitive     CEFTRIAXONE <=1 SENSITIVE Sensitive     CIPROFLOXACIN <=0.25 SENSITIVE Sensitive     GENTAMICIN <=1 SENSITIVE Sensitive     IMIPENEM <=0.25 SENSITIVE Sensitive     NITROFURANTOIN <=16 SENSITIVE Sensitive     TRIMETH/SULFA <=20 SENSITIVE Sensitive     AMPICILLIN/SULBACTAM 4 SENSITIVE Sensitive     PIP/TAZO <=4 SENSITIVE Sensitive     Extended ESBL NEGATIVE Sensitive     * >=100,000 COLONIES/mL ESCHERICHIA COLI  Surgical pcr screen     Status: None   Collection Time: 09/28/16  1:33 AM  Result Value Ref Range Status   MRSA, PCR NEGATIVE NEGATIVE Final   Staphylococcus aureus NEGATIVE NEGATIVE Final    Comment:        The Xpert SA Assay (FDA approved for NASAL specimens in patients over 46 years of age), is one component of a comprehensive surveillance program.  Test performance has been validated by [facility] for patients greater than or equal to 26 year old. It is not intended to diagnose infection nor to guide or monitor treatment.      Studies: Pelvis Portable  Result Date: 09/28/2016 CLINICAL DATA:  Right hip replacement surgery. EXAM: PORTABLE PELVIS 1-2 VIEWS COMPARISON:  CT scan 09/28/2014 FINDINGS: There is a bipolar type hip prosthesis on the right side which appears to be in good position without complicating features. No periprosthetic fracture. Stable healing right-sided superior and inferior pubic rami fractures. IMPRESSION: Well seated bipolar type right hip prosthesis without complicating features. Electronically Signed   By: Marijo Sanes M.D.   On: 09/28/2016 15:15   Dg C-arm 1-60 Min  Result Date: 09/28/2016 CLINICAL DATA:  Right hip  hemiarthroplasty. EXAM: OPERATIVE right HIP (WITH PELVIS IF PERFORMED) 2 VIEWS TECHNIQUE: Fluoroscopic spot image(s) were submitted for interpretation post-operatively. COMPARISON:  09/27/2016. FINDINGS: Bipolar hip arthroplasty on the right. Components are well-positioned. No radiographically detectable complication. Healing rami fractures again demonstrated. IMPRESSION: Good appearance following bipolar hip arthroplasty on the right. Electronically Signed   By: Nelson Chimes M.D.   On: 09/28/2016 14:28   Dg Hip Operative Unilat W Or W/o Pelvis Right  Result Date: 09/28/2016 CLINICAL DATA:  Right hip hemiarthroplasty. EXAM: OPERATIVE right HIP (WITH PELVIS IF PERFORMED) 2 VIEWS TECHNIQUE: Fluoroscopic spot image(s) were submitted for interpretation post-operatively. COMPARISON:  09/27/2016. FINDINGS: Bipolar hip arthroplasty on the right. Components are well-positioned. No radiographically detectable complication. Healing rami fractures again demonstrated. IMPRESSION: Good appearance following bipolar hip arthroplasty on the right. Electronically Signed   By: Nelson Chimes M.D.   On: 09/28/2016 14:28  Scheduled Meds: . amiodarone  100 mg Oral Daily  . apixaban  2.5 mg Oral BID  . ciprofloxacin  250 mg Oral BID  . haloperidol lactate  1 mg Intravenous Once  . insulin aspart  0-9 Units Subcutaneous TID WC  . lisinopril  20 mg Oral Daily  . potassium chloride SA  40 mEq Oral QODAY  . risperiDONE  0.25 mg Oral QHS  . rosuvastatin  5 mg Oral QHS   Continuous Infusions: . sodium chloride 125 mL/hr at 09/28/16 1640  . methocarbamol (ROBAXIN)  IV      Active Problems:   Fracture of left leg   Closed displaced fracture of right femoral neck (HCC)    Time spent: 25 minutes    Barton Dubois  [facility] Pager [phone]. If 7PM-7AM, please contact night-coverage at [email or web address], password [facility] 09/29/2016, 5:43 PM  LOS: 2 days

## 2016-09-29 NOTE — Evaluation (Signed)
Physical Therapy Evaluation Patient Details Name: Cynthia Wright MRN: [id number] DOB: 1925/01/11 Today's Date: 09/29/2016   History of Present Illness  NICKCOLE BRALLEY a 83 y.o.femalewho presents with worsening right hip pain with inability to ambulate.Xray showes displaced femoral neck fracture. Pt now s/p R anterior apporach hip hemiarthroplasty.  Clinical Impression  Pt admitted with above. Pt with severe dementia with no recollection of fall or having R hip surgery. Pt maxA for bed mobility and modA for OOB transfer to chair. Recommend SNF upon d/c to maximize functional return.    Follow Up Recommendations SNF    Equipment Recommendations  None recommended by PT    Recommendations for Other Services       Precautions / Restrictions Precautions Precautions: Fall Precaution Comments: pt with severe dementia, unaware of surgery Restrictions Weight Bearing Restrictions: Yes RLE Weight Bearing: Weight bearing as tolerated      Mobility  Bed Mobility Overal bed mobility: Needs Assistance Bed Mobility: Supine to Sit     Supine to sit: Max assist;HOB elevated     General bed mobility comments: max directional v/c's to stay and complete task. maxA for R LE management due to onset of pain, maxA for trunk elevaiton and to scoot to EOB  Transfers Overall transfer level: Needs assistance Equipment used: Rolling walker (2 wheeled) Transfers: Sit to/from [facility] Sit to Stand: Mod assist Stand pivot transfers: Min assist;Mod assist       General transfer comment: due to dementia provided less physical assist and let pt use her normal technique. PT held down RW and pt pulled up on it, modA given to achieve full upright posture, min/modA for stand pvt for walker management and safety during std pvt  Ambulation/Gait             General Gait Details: limited to std pvt, pt deferred ambulation and impulsively tried to sit  Stairs             Wheelchair Mobility    Modified Rankin (Stroke Patients Only)       Balance Overall balance assessment: Needs assistance Sitting-balance support: Feet supported;No upper extremity supported Sitting balance-Leahy Scale: Fair     Standing balance support: During functional activity Standing balance-Leahy Scale: Poor Standing balance comment: needs RW and physical assist                             Pertinent Vitals/Pain Pain Assessment: Faces Faces Pain Scale: Hurts even more Pain Location: R hip with movement, no sign of discomfort at rest    Home Living Family/patient expects to be discharged to:: Skilled nursing facility                 Additional Comments: was living at home by herself with recent 24/7 assist due to hip pain    Prior Function Level of Independence: Independent with assistive device(s)         Comments: used RW, was driving, cleaning, cooking     Hand Dominance   Dominant Hand: Right    Extremity/Trunk Assessment   Upper Extremity Assessment Upper Extremity Assessment: Generalized weakness    Lower Extremity Assessment Lower Extremity Assessment: RLE deficits/detail;Generalized weakness RLE Deficits / Details: pt able to initiate LAQ, grimaced with movement however    Cervical / Trunk Assessment Cervical / Trunk Assessment: Kyphotic  Communication   Communication: No difficulties  Cognition Arousal/Alertness: Awake/alert Behavior During Therapy: Restless (very fidgety) Overall  Cognitive Status: History of cognitive impairments - at baseline                                 General Comments: dementia, became paranoid and perseverative but pleasant and compliant      General Comments General comments (skin integrity, edema, etc.): pt with brusing in R LE    Exercises     Assessment/Plan    PT Assessment Patient needs continued PT services  PT Problem List Decreased strength;Decreased range of  motion;Decreased activity tolerance;Decreased balance;Decreased mobility;Decreased coordination;Decreased cognition;Decreased knowledge of use of DME;Decreased safety awareness;Decreased knowledge of precautions;Pain       PT Treatment Interventions DME instruction;Stair training;Gait training;Functional mobility training;Therapeutic activities;Therapeutic exercise;Balance training;Neuromuscular re-education    PT Goals (Current goals can be found in the Care Plan section)  Acute Rehab PT Goals Patient Stated Goal: didn't state PT Goal Formulation: With family Time For Goal Achievement: 10/13/16 Potential to Achieve Goals: Good    Frequency Min 3X/week   Barriers to discharge Decreased caregiver support lives alone    Co-evaluation               AM-PAC PT "6 Clicks" Daily Activity  Outcome Measure Difficulty turning over in bed (including adjusting bedclothes, sheets and blankets)?: A Lot Difficulty moving from lying on back to sitting on the side of the bed? : A Lot Difficulty sitting down on and standing up from a chair with arms (e.g., wheelchair, bedside commode, etc,.)?: A Lot Help needed moving to and from a bed to chair (including a wheelchair)?: A Lot Help needed walking in hospital room?: A Lot Help needed climbing 3-5 steps with a railing? : A Lot 6 Click Score: 12    End of Session Equipment Utilized During Treatment: Gait belt Activity Tolerance: Patient tolerated treatment well Patient left: in chair;with call bell/phone within reach;with chair alarm set;with nursing/sitter in room (OT present) Nurse Communication: Mobility status PT Visit Diagnosis: Unsteadiness on feet (R26.81);History of falling (Z91.81);Muscle weakness (generalized) (M62.81);Pain Pain - Right/Left: Right Pain - part of body: Hip    Time: 4462-8638 PT Time Calculation (min) (ACUTE ONLY): 26 min   Charges:   PT Evaluation $PT Eval Moderate Complexity: 1 Procedure PT  Treatments $Therapeutic Activity: 8-22 mins   PT G Codes:        Kittie Plater, PT, DPT Pager #: [phone] Office #: [phone]   [place] 09/29/2016, 10:28 AM

## 2016-09-30 DIAGNOSIS — E1169 Type 2 diabetes mellitus with other specified complication: Secondary | ICD-10-CM | POA: Diagnosis not present

## 2016-09-30 DIAGNOSIS — S79919A Unspecified injury of unspecified hip, initial encounter: Secondary | ICD-10-CM | POA: Diagnosis not present

## 2016-09-30 DIAGNOSIS — S72011D Unspecified intracapsular fracture of right femur, subsequent encounter for closed fracture with routine healing: Secondary | ICD-10-CM | POA: Diagnosis not present

## 2016-09-30 DIAGNOSIS — E876 Hypokalemia: Secondary | ICD-10-CM

## 2016-09-30 DIAGNOSIS — I420 Dilated cardiomyopathy: Secondary | ICD-10-CM | POA: Diagnosis present

## 2016-09-30 DIAGNOSIS — Z9181 History of falling: Secondary | ICD-10-CM | POA: Diagnosis not present

## 2016-09-30 DIAGNOSIS — K5900 Constipation, unspecified: Secondary | ICD-10-CM | POA: Diagnosis not present

## 2016-09-30 DIAGNOSIS — S72001A Fracture of unspecified part of neck of right femur, initial encounter for closed fracture: Secondary | ICD-10-CM | POA: Diagnosis not present

## 2016-09-30 DIAGNOSIS — J9811 Atelectasis: Secondary | ICD-10-CM | POA: Diagnosis present

## 2016-09-30 DIAGNOSIS — S72009A Fracture of unspecified part of neck of unspecified femur, initial encounter for closed fracture: Secondary | ICD-10-CM | POA: Diagnosis not present

## 2016-09-30 DIAGNOSIS — W19XXXD Unspecified fall, subsequent encounter: Secondary | ICD-10-CM

## 2016-09-30 DIAGNOSIS — R4182 Altered mental status, unspecified: Secondary | ICD-10-CM | POA: Diagnosis not present

## 2016-09-30 DIAGNOSIS — Z471 Aftercare following joint replacement surgery: Secondary | ICD-10-CM | POA: Diagnosis not present

## 2016-09-30 DIAGNOSIS — Z8249 Family history of ischemic heart disease and other diseases of the circulatory system: Secondary | ICD-10-CM | POA: Diagnosis not present

## 2016-09-30 DIAGNOSIS — Z5189 Encounter for other specified aftercare: Secondary | ICD-10-CM

## 2016-09-30 DIAGNOSIS — B9620 Unspecified Escherichia coli [E. coli] as the cause of diseases classified elsewhere: Secondary | ICD-10-CM

## 2016-09-30 DIAGNOSIS — R52 Pain, unspecified: Secondary | ICD-10-CM | POA: Diagnosis not present

## 2016-09-30 DIAGNOSIS — F0391 Unspecified dementia with behavioral disturbance: Secondary | ICD-10-CM | POA: Diagnosis not present

## 2016-09-30 DIAGNOSIS — F0281 Dementia in other diseases classified elsewhere with behavioral disturbance: Secondary | ICD-10-CM | POA: Diagnosis not present

## 2016-09-30 DIAGNOSIS — I480 Paroxysmal atrial fibrillation: Secondary | ICD-10-CM | POA: Diagnosis not present

## 2016-09-30 DIAGNOSIS — Z885 Allergy status to narcotic agent status: Secondary | ICD-10-CM | POA: Diagnosis not present

## 2016-09-30 DIAGNOSIS — W19XXXA Unspecified fall, initial encounter: Secondary | ICD-10-CM | POA: Diagnosis not present

## 2016-09-30 DIAGNOSIS — Z7984 Long term (current) use of oral hypoglycemic drugs: Secondary | ICD-10-CM | POA: Diagnosis not present

## 2016-09-30 DIAGNOSIS — E785 Hyperlipidemia, unspecified: Secondary | ICD-10-CM

## 2016-09-30 DIAGNOSIS — S8990XA Unspecified injury of unspecified lower leg, initial encounter: Secondary | ICD-10-CM | POA: Diagnosis not present

## 2016-09-30 DIAGNOSIS — I4891 Unspecified atrial fibrillation: Secondary | ICD-10-CM | POA: Diagnosis not present

## 2016-09-30 DIAGNOSIS — I5030 Unspecified diastolic (congestive) heart failure: Secondary | ICD-10-CM | POA: Diagnosis not present

## 2016-09-30 DIAGNOSIS — K5641 Fecal impaction: Secondary | ICD-10-CM | POA: Diagnosis present

## 2016-09-30 DIAGNOSIS — D509 Iron deficiency anemia, unspecified: Secondary | ICD-10-CM | POA: Diagnosis present

## 2016-09-30 DIAGNOSIS — S72001D Fracture of unspecified part of neck of right femur, subsequent encounter for closed fracture with routine healing: Secondary | ICD-10-CM | POA: Diagnosis not present

## 2016-09-30 DIAGNOSIS — G301 Alzheimer's disease with late onset: Secondary | ICD-10-CM | POA: Diagnosis not present

## 2016-09-30 DIAGNOSIS — S3993XA Unspecified injury of pelvis, initial encounter: Secondary | ICD-10-CM | POA: Diagnosis not present

## 2016-09-30 DIAGNOSIS — A419 Sepsis, unspecified organism: Secondary | ICD-10-CM | POA: Diagnosis not present

## 2016-09-30 DIAGNOSIS — D649 Anemia, unspecified: Secondary | ICD-10-CM

## 2016-09-30 DIAGNOSIS — Z7901 Long term (current) use of anticoagulants: Secondary | ICD-10-CM | POA: Diagnosis not present

## 2016-09-30 DIAGNOSIS — I481 Persistent atrial fibrillation: Secondary | ICD-10-CM | POA: Diagnosis not present

## 2016-09-30 DIAGNOSIS — F03918 Unspecified dementia, unspecified severity, with other behavioral disturbance: Secondary | ICD-10-CM

## 2016-09-30 DIAGNOSIS — Z96641 Presence of right artificial hip joint: Secondary | ICD-10-CM | POA: Diagnosis present

## 2016-09-30 DIAGNOSIS — I10 Essential (primary) hypertension: Secondary | ICD-10-CM | POA: Diagnosis not present

## 2016-09-30 DIAGNOSIS — E119 Type 2 diabetes mellitus without complications: Secondary | ICD-10-CM | POA: Diagnosis not present

## 2016-09-30 DIAGNOSIS — I959 Hypotension, unspecified: Secondary | ICD-10-CM | POA: Diagnosis present

## 2016-09-30 DIAGNOSIS — Z8673 Personal history of transient ischemic attack (TIA), and cerebral infarction without residual deficits: Secondary | ICD-10-CM | POA: Diagnosis not present

## 2016-09-30 DIAGNOSIS — S32591A Other specified fracture of right pubis, initial encounter for closed fracture: Secondary | ICD-10-CM | POA: Diagnosis not present

## 2016-09-30 DIAGNOSIS — R000 Tachycardia, unspecified: Secondary | ICD-10-CM | POA: Diagnosis not present

## 2016-09-30 DIAGNOSIS — I110 Hypertensive heart disease with heart failure: Secondary | ICD-10-CM | POA: Diagnosis present

## 2016-09-30 DIAGNOSIS — Z800 Family history of malignant neoplasm of digestive organs: Secondary | ICD-10-CM | POA: Diagnosis not present

## 2016-09-30 DIAGNOSIS — Z87891 Personal history of nicotine dependence: Secondary | ICD-10-CM | POA: Diagnosis not present

## 2016-09-30 DIAGNOSIS — J189 Pneumonia, unspecified organism: Secondary | ICD-10-CM | POA: Diagnosis not present

## 2016-09-30 DIAGNOSIS — N390 Urinary tract infection, site not specified: Secondary | ICD-10-CM

## 2016-09-30 DIAGNOSIS — D508 Other iron deficiency anemias: Secondary | ICD-10-CM | POA: Diagnosis not present

## 2016-09-30 DIAGNOSIS — S72001H Fracture of unspecified part of neck of right femur, subsequent encounter for open fracture type I or II with delayed healing: Secondary | ICD-10-CM | POA: Diagnosis not present

## 2016-09-30 DIAGNOSIS — R410 Disorientation, unspecified: Secondary | ICD-10-CM | POA: Diagnosis not present

## 2016-09-30 DIAGNOSIS — M6281 Muscle weakness (generalized): Secondary | ICD-10-CM | POA: Diagnosis not present

## 2016-09-30 DIAGNOSIS — I5032 Chronic diastolic (congestive) heart failure: Secondary | ICD-10-CM | POA: Diagnosis not present

## 2016-09-30 DIAGNOSIS — R262 Difficulty in walking, not elsewhere classified: Secondary | ICD-10-CM | POA: Diagnosis not present

## 2016-09-30 DIAGNOSIS — M19041 Primary osteoarthritis, right hand: Secondary | ICD-10-CM | POA: Diagnosis not present

## 2016-09-30 LAB — HEMOGLOBIN A1C
Hgb A1c MFr Bld: 6.1 % — ABNORMAL HIGH (ref 4.8–5.6)
Mean Plasma Glucose: 128 mg/dL

## 2016-09-30 LAB — BASIC METABOLIC PANEL
ANION GAP: 7 (ref 5–15)
BUN: 31 mg/dL — ABNORMAL HIGH (ref 6–20)
CALCIUM: 8.9 mg/dL (ref 8.9–10.3)
CO2: 23 mmol/L (ref 22–32)
Chloride: 110 mmol/L (ref 101–111)
Creatinine, Ser: 1.03 mg/dL — ABNORMAL HIGH (ref 0.44–1.00)
GFR calc Af Amer: 54 mL/min — ABNORMAL LOW (ref >60)
GFR calc non Af Amer: 47 mL/min — ABNORMAL LOW (ref >60)
GLUCOSE: 160 mg/dL — AB (ref 65–99)
Potassium: 4.2 mmol/L (ref 3.5–5.1)
Sodium: 140 mmol/L (ref 135–145)

## 2016-09-30 LAB — CBC
HEMATOCRIT: 32.5 % — AB (ref 36.0–46.0)
Hemoglobin: 10.5 g/dL — ABNORMAL LOW (ref 12.0–15.0)
MCH: 33 pg (ref 26.0–34.0)
MCHC: 32.3 g/dL (ref 30.0–36.0)
MCV: 102.2 fL — AB (ref 78.0–100.0)
Platelets: 185 10*3/uL (ref 150–400)
RBC: 3.18 MIL/uL — ABNORMAL LOW (ref 3.87–5.11)
RDW: 14.1 % (ref 11.5–15.5)
WBC: 12.5 10*3/uL — AB (ref 4.0–10.5)

## 2016-09-30 LAB — GLUCOSE, CAPILLARY
GLUCOSE-CAPILLARY: 148 mg/dL — AB (ref 65–99)
GLUCOSE-CAPILLARY: 174 mg/dL — AB (ref 65–99)

## 2016-09-30 MED ORDER — POLYSACCHARIDE IRON COMPLEX 150 MG PO CAPS: 150.0000 mg | ORAL_CAPSULE | Freq: Every day | ORAL | Status: DC

## 2016-09-30 MED ORDER — RISPERIDONE 0.25 MG PO TABS: 0.2500 mg | ORAL_TABLET | Freq: Every day | ORAL | Status: DC

## 2016-09-30 MED ORDER — POLYSACCHARIDE IRON COMPLEX 150 MG PO CAPS: 150.0000 mg | ORAL_CAPSULE | Freq: Every day | ORAL | Status: AC

## 2016-09-30 MED ORDER — CIPROFLOXACIN HCL 250 MG PO TABS: 250.0000 mg | ORAL_TABLET | Freq: Two times a day (BID) | ORAL | Status: AC

## 2016-09-30 NOTE — Social Work (Signed)
Clinical Social Worker facilitated patient discharge including contacting patient family and facility to confirm patient discharge plans.  Clinical information faxed to facility and family agreeable with plan.    CSW arranged ambulance transport via [facility] to [place].    RN to call [phone] to give report prior to discharge.  Clinical Social Worker will sign off for now as social work intervention is no longer needed. Please consult us again if new need arises.  Mitchelle Sultan, LCSW Clinical Social Worker [phone]    

## 2016-09-30 NOTE — Progress Notes (Signed)
   Subjective:  Patient is confused.  NAD  Objective:   VITALS:   Vitals:   09/29/16 1100 09/29/16 1416 09/29/16 2348 09/30/16 0335  BP:  (!) 131/44 (!) 157/72 (!) 162/51  Pulse:  75 80 100  Resp:  18 18 20   Temp:  97.2 F (36.2 C) (!) 100.6 F (38.1 C) 99.8 F (37.7 C)  TempSrc:  Oral Axillary Axillary  SpO2:  100% 100% 97%  Weight: 132 lb 4.4 oz (60 kg)     Height: 5\' 5"  (1.651 m)       Intact pulses distally Dorsiflexion/Plantar flexion intact Incision: dressing C/D/I and no drainage No cellulitis present Compartment soft   Lab Results  Component Value Date   WBC 13.2 (H) 09/29/2016   HGB 11.9 (L) 09/29/2016   HCT 37.1 09/29/2016   MCV 100.8 (H) 09/29/2016   PLT 228 09/29/2016     Assessment/Plan:  2 Days Post-Op   - Expected postop acute blood loss anemia - will monitor for symptoms - Up with PT/OT - DVT ppx - SCDs, ambulation, eliquis - WBAT operative extremity, no hip precautions - Pain control - Discharge planning - SNF  Eduard Roux 09/30/2016, 7:25 AM [phone]

## 2016-09-30 NOTE — Clinical Social Work Note (Signed)
Clinical Social Work Assessment  Patient Details  Name: Cynthia Wright MRN: [id number] Date of Birth: [birth year]  Date of referral:  09/30/16               Reason for consult:  Facility Placement                Permission sought to share information with:    Permission granted to share information::  Yes, Verbal Permission Granted  Name::     son, Jiali Linney   Agency::  SNF  Relationship::     Contact Information:     Housing/Transportation Living arrangements for the past 2 months:  [place] ([place]) Source of Information:  Adult Children Patient Interpreter Needed:  None Criminal Activity/Legal Involvement Pertinent to Current Situation/Hospitalization:  No - Comment as needed Significant Relationships:  Adult Children Lives with:  Self Do you feel safe going back to the place where you live?  No Need for family participation in patient care:  Yes (Comment)  Care giving concerns:  Pt resided on her own in a town house. Son and daughter-n-law checked on her often. At this time, patient not safe to return home.  Social Worker assessment / plan:  CSW met with patient and family to discuss clinical teams recommendations at dc to a SNF. Patient/family agreeable. CSW discussed SNF options/placment. Family has experience with SNF as patient went to [facility] in the past. Family interested in [place], [place] and [facility]. CSW obtained permission to send offers.  Employment status:  Retired Forensic scientist:  [employer] PT Recommendations:  [place] / Referral to community resources:  [place]  Patient/Family's Response to care:  Psychologist, prison and probation services of CSW assistance. No issues with care.  Patient/Family's Understanding of and Emotional Response to Diagnosis, Current Treatment, and Prognosis:  Patient/family has good understanding of diagnosis, current treatment and prognosis. They are hopeful that family will  get back to baseline, if not they will explore options at the end of rehabilitation. No issues or concerns at this time.  Emotional Assessment Appearance:  Appears stated age Attitude/Demeanor/Rapport:   (Cooperative) Affect (typically observed):  Accepting, Appropriate Orientation:  Oriented to Self Alcohol / Substance use:  Not Applicable Psych involvement (Current and /or in the community):  No (Comment)  Discharge Needs  Concerns to be addressed:  Care Coordination Readmission within the last 30 days:    Current discharge risk:  Physical Impairment, Dependent with Mobility Barriers to Discharge:  No Barriers Identified   Normajean Baxter, LCSW 09/30/2016, 11:58 AM

## 2016-09-30 NOTE — Discharge Summary (Signed)
Physician Discharge Summary  Cynthia Wright:[id number] DOB: 04/16/1924 DOA: 09/27/2016  PCP: Aretta Nip, MD  Admit date: 09/27/2016 Discharge date: 09/30/2016  Time spent: 35 minutes  Recommendations for Outpatient Follow-up:  1. Repeat BMET in 5 days to follow electrolytes and renal function  2. Repeat CBC in 5 days to follow Hgb trend  3. Please make sure patient follow up with Dr. Erlinda Hong (orthopedic service ) in 2 weeks. 4. Constant reorientation and slowly wean off risperdal    Discharge Diagnoses:  Active Problems:   Fracture of left leg   Closed displaced fracture of right femoral neck (HCC)   AF (paroxysmal atrial fibrillation) (HCC)   Dementia with behavioral disturbance   Hypokalemia   Postoperative anemia   Hyperlipidemia   Discharge Condition: stable and improved. Patient discharge to SNF for further care and rehabilitation.  Diet recommendation: heart healthy and modified carbohydrates diet   Filed Weights   09/29/16 1100  Weight: 60 kg (132 lb 4.4 oz)    Brief History of present illness:  83 y/o female with hx of HTN, HLD, atrial fibrillation (on eliquis) and chronic diastolic HF; who presented to ED after mechanical fall and found with right hip fracture.  Hospital Course:  1-displaced subcapital fracture of the right femoral neck: -continue supportive care -follow Hgb tred; mild anemia post operative procedure  -follow orthopedic service post operative rec's -continue Eliquis for DVT prophylaxis -per PT rec's, needs SNF at discharge -SW aware, will follow bed availability   2-HTN -BP stable -will continue lisinopril   3-HLD -continue statins home dose (crestor)  4-Atrial Fibrillation: PAF  -rate has remained controlled -continue pacerone and continue eliquis  5-diabetes mellitus -will resume [place] daily -continue monitoring carbohydrates in her diet -A1C 6.1  6-E. Coli UTI -patient is afebrile and with normal  WBC's -microorganism sensitivity reflects that is sensitive to ciprofloxacin  -will continue oral antibiotics. (plan is to treat for 5 days total.) 2 more days pending at discharge.  7-chronic diastolic HF -stable and has remained compensated -will recommend heart healthy diet and daily weights -will continue the use of intermittent lasix as previously prescribed  -maintain adequate hydration.  -last echo with EF 65%  8-patient with sundowning/hospital delirium/agitation and underlying dementia -will recommend constant reorientation -continue supportive care -minimize sedative agents and pain killers -initiated on low dose risperdal -follow response and wean her off of meds as tolerated  9-post operative anemia -Hgb 10.5 -no signs of acute bleeding appreciated now -patient asymptomatic -will start her on niferex  10-mild hypokalemia -repleted -patient on lisinopril -recommend BMET at follow up to monitor electrolytes trend   Procedures:  Prosthetic replacement of Right femoral neck fracture   Consultations:  Orthopedic service   Discharge Exam: Vitals:   09/29/16 2348 09/30/16 0335  BP: (!) 157/72 (!) 162/51  Pulse: 80 100  Resp: 18 20  Temp: (!) 100.6 F (38.1 C) 99.8 F (37.7 C)    General: patient is less confused and oriented X2 today. No signs of agitation. Denies CP, SOB and reporting pain in her leg is well controlled. No nausea, no vomiting.   Cardiovascular: no JVD, no rubs, no gallops, rate controlled. Positive SEM.  Respiratory: no tachypnea, no using accessory muscles and with normal resp effort. No wheezing or crackles.    Abdomen: soft, NT, ND, positive BS  Musculoskeletal: no cyanosis or clubbing. Right leg with decrease ROM. No cyanosis or clubbing, good pulses and intact sensation.[name] Kitchen   Discharge Instructions  Discharge Instructions    Diet - low sodium heart healthy    Complete by:  As directed    Discharge instructions    Complete  by:  As directed    Maintain adequate hydration  Take medications as prescribed  Follow up with orthopedic service as instructed (2 weeks) Weight bearing as tolerated Repeat BMET in 5 days to follow electrolytes and renal function  Repeat CBC in 5 days to follow Hgb trend   Increase activity slowly    Complete by:  As directed    Weight bearing as tolerated    Complete by:  As directed      Current Discharge Medication List    START taking these medications   Details  ciprofloxacin (CIPRO) 250 MG tablet Take 1 tablet (250 mg total) by mouth 2 (two) times daily.    iron polysaccharides (NIFEREX) 150 MG capsule Take 1 capsule (150 mg total) by mouth daily.    oxyCODONE (OXY IR/ROXICODONE) 5 MG immediate release tablet Take 1-3 tablets (5-15 mg total) by mouth every 4 (four) hours as needed. Qty: 90 tablet, Refills: 0    risperiDONE (RISPERDAL) 0.25 MG tablet Take 1 tablet (0.25 mg total) by mouth at bedtime.      CONTINUE these medications which have NOT CHANGED   Details  amiodarone (PACERONE) 100 MG tablet Take 100 mg by mouth daily.    ELIQUIS 2.5 MG TABS tablet TAKE 1 TABLET (2.5 MG TOTAL) BY MOUTH 2 (TWO) TIMES DAILY. Qty: 60 tablet, Refills: 5    furosemide (LASIX) 20 MG tablet Take 20 mg by mouth daily as needed for edema.     lisinopril (PRINIVIL,ZESTRIL) 40 MG tablet TAKE 1 TABLET (40 MG TOTAL) BY MOUTH DAILY. Qty: 90 tablet, Refills: 3    potassium chloride SA (KLOR-CON M20) 20 MEQ tablet Take 2 tablets (40 mEq total) by mouth every other day. Qty: 30 tablet, Refills: 9    rosuvastatin (CRESTOR) 5 MG tablet Take 5 mg by mouth at bedtime.     sitaGLIPtin (JANUVIA) 100 MG tablet Take 100 mg by mouth daily.      STOP taking these medications     oxyCODONE-acetaminophen (PERCOCET) 5-325 MG tablet        Allergies  Allergen Reactions  . Codeine Nausea And Vomiting  . Lortab [Hydrocodone-Acetaminophen] Nausea And Vomiting   Follow-up Information    Leandrew Koyanagi, MD Follow up in 2 week(s).   Specialty:  Orthopedic Surgery Why:  For suture removal, For wound re-check Contact information: [place] [address] [phone]            The results of significant diagnostics from this hospitalization (including imaging, microbiology, ancillary and laboratory) are listed below for reference.    Significant Diagnostic Studies: Dg Pelvis 1-2 Views  Result Date: 09/27/2016 CLINICAL DATA:  Right hip and pelvic pain.  Recent pelvic fracture. EXAM: PELVIS - 1-2 VIEW COMPARISON:  07/21/2016 FINDINGS: Acute subcapital fracture of the right femoral neck is seen which shows mild displacement. No evidence of dislocation. Healing subacute fractures of the right superior and inferior pubic rami are seen, which show increased healing response since prior study. Generalized osteopenia noted. IMPRESSION: Mildly displaced subcapital fracture of the right femoral neck. Healing subacute fractures of right superior and inferior pubic rami. Electronically Signed   By: Earle Gell M.D.   On: 09/27/2016 12:15   Pelvis Portable  Result Date: 09/28/2016 CLINICAL DATA:  Right hip replacement surgery. EXAM:  PORTABLE PELVIS 1-2 VIEWS COMPARISON:  CT scan 09/28/2014 FINDINGS: There is a bipolar type hip prosthesis on the right side which appears to be in good position without complicating features. No periprosthetic fracture. Stable healing right-sided superior and inferior pubic rami fractures. IMPRESSION: Well seated bipolar type right hip prosthesis without complicating features. Electronically Signed   By: Marijo Sanes M.D.   On: 09/28/2016 15:15   Ct Hip Right Wo Contrast  Result Date: 09/27/2016 CLINICAL DATA:  Evaluate subcapital fracture. EXAM: CT OF THE RIGHT HIP WITHOUT CONTRAST TECHNIQUE: Multidetector CT imaging of the right hip was performed according to the standard protocol. Multiplanar CT image reconstructions were also  generated. COMPARISON:  Radiographs 09/27/2016 hand CT scan 07/11/2016 FINDINGS: There is a wound mildly impacted and slightly displaced subcapital fracture of the right hip. There is mild internal rotation of the femoral head and slight lateral displacement of the femoral neck, giving a slight varus deformity. Moderate right hip joint degenerative changes with joint space narrowing and spurring. The acetabulum is intact. No fracture. There are partially united superior and inferior pubic rami fractures on the right side. Exuberant callus formation. The visualized right SI joint appears normal. The surrounding hip and pelvic musculature are grossly normal. No significant intrapelvic abnormalities on the right side. IMPRESSION: 1. Mildly impacted and slightly displaced subcapital fracture of the right hip as described above. 2. Partially united superior and inferior pubic rami fractures on the right. Electronically Signed   By: Marijo Sanes M.D.   On: 09/27/2016 15:06   Dg C-arm 1-60 Min  Result Date: 09/28/2016 CLINICAL DATA:  Right hip hemiarthroplasty. EXAM: OPERATIVE right HIP (WITH PELVIS IF PERFORMED) 2 VIEWS TECHNIQUE: Fluoroscopic spot image(s) were submitted for interpretation post-operatively. COMPARISON:  09/27/2016. FINDINGS: Bipolar hip arthroplasty on the right. Components are well-positioned. No radiographically detectable complication. Healing rami fractures again demonstrated. IMPRESSION: Good appearance following bipolar hip arthroplasty on the right. Electronically Signed   By: Nelson Chimes M.D.   On: 09/28/2016 14:28   Dg Hip Operative Unilat W Or W/o Pelvis Right  Result Date: 09/28/2016 CLINICAL DATA:  Right hip hemiarthroplasty. EXAM: OPERATIVE right HIP (WITH PELVIS IF PERFORMED) 2 VIEWS TECHNIQUE: Fluoroscopic spot image(s) were submitted for interpretation post-operatively. COMPARISON:  09/27/2016. FINDINGS: Bipolar hip arthroplasty on the right. Components are well-positioned. No  radiographically detectable complication. Healing rami fractures again demonstrated. IMPRESSION: Good appearance following bipolar hip arthroplasty on the right. Electronically Signed   By: Nelson Chimes M.D.   On: 09/28/2016 14:28   Dg Hip Unilat W Or Wo Pelvis 2-3 Views Right  Result Date: 09/14/2016 CLINICAL DATA:  Right hip pain, increasing over the last 24 hours. EXAM: DG HIP (WITH OR WITHOUT PELVIS) 2-3V RIGHT COMPARISON:  None. FINDINGS: Fracture through the right superior pubic ramus extending to the anterior column of the acetabulum, similar in the interval. Increasing callus formation at the right inferior pubic ramus fracture. The right hip is intact with no fracture or dislocation. No acute fractures are seen. IMPRESSION: Healing fractures through the superior and inferior right pubic rami. No acute fracture. Electronically Signed   By: Dorise Bullion III M.D   On: 09/14/2016 12:54   Dg Femur Min 2 Views Left  Result Date: 09/27/2016 CLINICAL DATA:  Fall.  Left hip and pelvic pain.  Initial encounter. EXAM: LEFT FEMUR 2 VIEWS COMPARISON:  None. FINDINGS: There is no evidence of fracture or other focal bone lesions. Mild peripheral vascular calcification noted. IMPRESSION: No acute findings. Electronically  Signed   By: Earle Gell M.D.   On: 09/27/2016 12:16   Dg Femur Min 2 Views Right  Result Date: 09/27/2016 CLINICAL DATA:  Fall.  Right hip pain.  Initial encounter. EXAM: RIGHT FEMUR 2 VIEWS COMPARISON:  None. FINDINGS: Mildly displaced subcapital fracture right femoral neck is seen. No other femur fracture identified. No evidence of hip dislocation. Mild peripheral vascular calcification noted. IMPRESSION: Mildly displaced subcapital right femoral neck fracture. Electronically Signed   By: Earle Gell M.D.   On: 09/27/2016 12:18    Microbiology: Recent Results (from the past 240 hour(s))  Urine culture     Status: Abnormal   Collection Time: 09/27/16 10:39 AM  Result Value Ref Range  Status   Specimen Description URINE, CLEAN CATCH  Final   Special Requests NONE  Final   Culture >=100,000 COLONIES/mL ESCHERICHIA COLI (A)  Final   Report Status 09/29/2016 FINAL  Final   Organism ID, Bacteria ESCHERICHIA COLI (A)  Final      Susceptibility   Escherichia coli - MIC*    AMPICILLIN 8 SENSITIVE Sensitive     CEFAZOLIN <=4 SENSITIVE Sensitive     CEFTRIAXONE <=1 SENSITIVE Sensitive     CIPROFLOXACIN <=0.25 SENSITIVE Sensitive     GENTAMICIN <=1 SENSITIVE Sensitive     IMIPENEM <=0.25 SENSITIVE Sensitive     NITROFURANTOIN <=16 SENSITIVE Sensitive     TRIMETH/SULFA <=20 SENSITIVE Sensitive     AMPICILLIN/SULBACTAM 4 SENSITIVE Sensitive     PIP/TAZO <=4 SENSITIVE Sensitive     Extended ESBL NEGATIVE Sensitive     * >=100,000 COLONIES/mL ESCHERICHIA COLI  Surgical pcr screen     Status: None   Collection Time: 09/28/16  1:33 AM  Result Value Ref Range Status   MRSA, PCR NEGATIVE NEGATIVE Final   Staphylococcus aureus NEGATIVE NEGATIVE Final    Comment:        The Xpert SA Assay (FDA approved for NASAL specimens in patients over 33 years of age), is one component of a comprehensive surveillance program.  Test performance has been validated by [facility] for patients greater than or equal to 59 year old. It is not intended to diagnose infection nor to guide or monitor treatment.      Labs: Basic Metabolic Panel:  Recent Labs Lab 09/27/16 1240 09/29/16 0417 09/30/16 0557  NA 142 137 140  K 4.2 3.3* 4.2  CL 109 105 110  CO2 25 22 23   GLUCOSE 107* 171* 160*  BUN 27* 30* 31*  CREATININE 1.15* 1.24* 1.03*  CALCIUM 9.3 8.6* 8.9   Liver Function Tests:  Recent Labs Lab 09/27/16 1240  AST 20  ALT 22  ALKPHOS 91  BILITOT 0.7  PROT 6.5  ALBUMIN 3.6   CBC:  Recent Labs Lab 09/27/16 1240 09/29/16 0417 09/30/16 0557  WBC 8.1 13.2* 12.5*  NEUTROABS 6.4  --   --   HGB 13.0 11.9* 10.5*  HCT 39.1 37.1 32.5*  MCV 100.0 100.8* 102.2*  PLT 202  228 185   Cardiac Enzymes:  Recent Labs Lab 09/27/16 1240  CKTOTAL 56   CBG:  Recent Labs Lab 09/29/16 1106 09/29/16 1629 09/29/16 2328 09/30/16 0649 09/30/16 1151  GLUCAP 170* 167* 166* 148* 174*    Signed:  Barton Dubois MD.  [facility] 09/30/2016, 2:37 PM

## 2016-09-30 NOTE — NC FL2 (Signed)
[place] LEVEL OF CARE SCREENING TOOL     IDENTIFICATION  Patient Name: Cynthia Wright Birthdate: 07-27-1924 Sex: female Admission Date (Current Location): 09/27/2016  [facility] and [place] Number:  Herbalist and Address:  [facility], [place] [address], [place], [place] [address]      Provider Number: [id number]  Attending Physician Name and Address:  Barton Dubois, MD  Relative Name and Phone Number:  son,  Celica Kotowski, [phone]    Current Level of Care: Hospital Recommended Level of Care: [place] Prior Approval Number:    Date Approved/Denied: 09/30/16 PASRR Number: [id number]  Discharge Plan: SNF    Current Diagnoses: Patient Active Problem List   Diagnosis Date Noted  . Closed displaced fracture of right femoral neck ([place]) 09/28/2016  . Fracture of left leg 09/27/2016  . Fall 07/12/2016  . Leukocytosis 07/12/2016  . Type 2 diabetes mellitus with hyperlipidemia ([place]) 07/12/2016  . History of atrial fibrillation 07/12/2016  . Acetabular fracture ([place]) 07/11/2016  . Dilated cardiomyopathy secondary to tachycardia ([place]) 12/14/2013  . Syncope 06/14/2013  . Bradycardia 05/11/2013  . Fatigue 05/11/2013  . Chronic diastolic CHF (congestive heart failure) ([place]) 01/06/2013  . Mitral regurgitation 08/06/2012  . Persistent atrial fibrillation ([place]) 08/02/2012  . Essential hypertension, benign 08/02/2012  . Pure hypercholesterolemia 08/02/2012  . Liver cyst 08/02/2012    Orientation RESPIRATION BLADDER Height & Weight     Self  O2 Incontinent Weight: 132 lb 4.4 oz (60 kg) Height:  5\' 5"  (165.1 cm)  BEHAVIORAL SYMPTOMS/MOOD NEUROLOGICAL BOWEL NUTRITION STATUS      Continent Diet (See DC Summary)  AMBULATORY STATUS COMMUNICATION OF NEEDS Skin   Extensive Assist Verbally Surgical wounds (Closed Left Leg Incision with Silicone Dressing)                       Personal Care Assistance  Level of Assistance  Bathing, Feeding, Dressing Bathing Assistance: Maximum assistance Feeding assistance: Limited assistance Dressing Assistance: Maximum assistance     Functional Limitations Info  Sight, Hearing, Speech Sight Info: Adequate Hearing Info: Adequate Speech Info: Adequate    SPECIAL CARE FACTORS FREQUENCY  PT (By licensed PT), OT (By licensed OT)     PT Frequency: 5xweek OT Frequency: 5xweek            Contractures      Additional Factors Info  Code Status, Allergies Code Status Info: Full Code Allergies Info: CODEINE, LORTAB HYDROCODONE-ACETAMINOPHEN            Current Medications (09/30/2016):  This is the current hospital active medication list Current Facility-Administered Medications  Medication Dose Route Frequency Provider Last Rate Last Dose  . 0.9 %  sodium chloride infusion   Intravenous Continuous Leandrew Koyanagi, MD 125 mL/hr at 09/28/16 1640    . acetaminophen (TYLENOL) tablet 650 mg  650 mg Oral Q6H PRN Leandrew Koyanagi, MD   650 mg at 09/30/16 0445   Or  . acetaminophen (TYLENOL) suppository 650 mg  650 mg Rectal Q6H PRN Leandrew Koyanagi, MD      . alum & mag hydroxide-simeth (MAALOX/MYLANTA) 200-200-20 MG/5ML suspension 30 mL  30 mL Oral Q4H PRN Leandrew Koyanagi, MD      . amiodarone (PACERONE) tablet 100 mg  100 mg Oral Daily Leandrew Koyanagi, MD   100 mg at 09/30/16 4332  . apixaban (ELIQUIS) tablet 2.5 mg  2.5 mg Oral BID Erlinda Hong,  Marylynn Pearson, MD   2.5 mg at 09/30/16 0823  . ciprofloxacin (CIPRO) tablet 250 mg  250 mg Oral BID Barton Dubois, MD   250 mg at 09/30/16 3716  . insulin aspart (novoLOG) injection 0-9 Units  0-9 Units Subcutaneous TID WC Barton Dubois, MD   1 Units at 09/30/16 0800  . lisinopril (PRINIVIL,ZESTRIL) tablet 20 mg  20 mg Oral Daily Leandrew Koyanagi, MD   20 mg at 09/30/16 9678  . menthol-cetylpyridinium (CEPACOL) lozenge 3 mg  1 lozenge Oral PRN Leandrew Koyanagi, MD       Or  . phenol (CHLORASEPTIC) mouth spray 1 spray  1 spray  Mouth/Throat PRN Leandrew Koyanagi, MD      . methocarbamol (ROBAXIN) tablet 500 mg  500 mg Oral Q6H PRN Leandrew Koyanagi, MD       Or  . methocarbamol (ROBAXIN) 500 mg in dextrose 5 % 50 mL IVPB  500 mg Intravenous Q6H PRN Leandrew Koyanagi, MD      . morphine 4 MG/ML injection 1 mg  1 mg Intravenous Q4H PRN Barton Dubois, MD      . ondansetron [facility]) tablet 4 mg  4 mg Oral Q6H PRN Leandrew Koyanagi, MD       Or  . ondansetron [facility]) injection 4 mg  4 mg Intravenous Q6H PRN Leandrew Koyanagi, MD      . oxyCODONE (Oxy IR/ROXICODONE) immediate release tablet 5-10 mg  5-10 mg Oral Q6H PRN Barton Dubois, MD   10 mg at 09/30/16 0445  . potassium chloride SA (K-DUR,KLOR-CON) CR tablet 40 mEq  40 mEq Oral Geanie Cooley, MD   40 mEq at 09/29/16 0959  . risperiDONE (RISPERDAL) tablet 0.25 mg  0.25 mg Oral QHS Barton Dubois, MD   0.25 mg at 09/29/16 2131  . rosuvastatin (CRESTOR) tablet 5 mg  5 mg Oral QHS Leandrew Koyanagi, MD   5 mg at 09/29/16 2131     Discharge Medications: Please see discharge summary for a list of discharge medications.  Relevant Imaging Results:  Relevant Lab Results:   Additional Information SS: [id number]  Normajean Baxter, LCSW

## 2016-09-30 NOTE — Clinical Social Work Placement (Signed)
   CLINICAL SOCIAL WORK PLACEMENT  NOTE  Date:  09/30/2016  Patient Details  Name: Cynthia Wright MRN: [id number] Date of Birth: 1924/07/14  Clinical Social Work is seeking post-discharge placement for this patient at the [place] level of care (*CSW will initial, date and re-position this form in  chart as items are completed):  Yes   Patient/family provided with [place] Work Department's list of facilities offering this level of care within the geographic area requested by the patient (or if unable, by the patient's family).  Yes   Patient/family informed of their freedom to choose among providers that offer the needed level of care, that participate in Medicare, Medicaid or managed care program needed by the patient, have an available bed and are willing to accept the patient.  Yes   Patient/family informed of [place]'s ownership interest in [facility] and [facility], as well as of the fact that they are under no obligation to receive care at these facilities.  PASRR submitted to EDS on       PASRR number received on 09/30/16     Existing PASRR number confirmed on 09/30/16     FL2 transmitted to all facilities in geographic area requested by pt/family on 09/30/16     FL2 transmitted to all facilities within larger geographic area on 09/30/16     Patient informed that his/her managed care company has contracts with or will negotiate with certain facilities, including the following:        Yes   Patient/family informed of bed offers received.  Patient chooses bed at [facility]     Physician recommends and patient chooses bed at      Patient to be transferred to [facility] on 09/30/16.  Patient to be transferred to facility by [facility]     Patient family notified on 09/30/16 of transfer.  Name of family member notified:  son and daughter-n-law     PHYSICIAN Please prepare priority discharge summary, including medications,  Please prepare prescriptions, Please sign FL2     Additional Comment:    _______________________________________________ Normajean Baxter, LCSW 09/30/2016, 3:23 PM

## 2016-09-30 NOTE — Progress Notes (Signed)
[place] called unit to obtain report on pt

## 2016-09-30 NOTE — Progress Notes (Signed)
Physical Therapy Treatment Patient Details Name: Cynthia Wright MRN: [id number] DOB: [birth year] Today's Date: 09/30/2016    History of Present Illness Cynthia Wright a 83 y.o.femalewho presents with worsening right hip pain with inability to ambulate.Xray showes displaced femoral neck fracture. Pt now s/p R anterior apporach hip hemiarthroplasty.    PT Comments    Pt continues to be limited by confusion, however was more plesent and willing cooperative this AM. Mod to Max A +2 for all mobilities. Pt should benefit from continued skilled PT to increase functional independence.   Follow Up Recommendations  SNF     Equipment Recommendations  None recommended by PT    Recommendations for Other Services       Precautions / Restrictions Precautions Precautions: Fall Precaution Comments: pt with severe dementia, unaware of surgery Restrictions Weight Bearing Restrictions: No RLE Weight Bearing: Weight bearing as tolerated    Mobility  Bed Mobility Overal bed mobility: Needs Assistance Bed Mobility: Supine to Sit     Supine to sit: HOB elevated;Mod assist     General bed mobility comments: Mod assist to progress LE to EOB and elevate trunk. Pt able to scoot hips EOB with cueing and increased time.  Transfers Overall transfer level: Needs assistance Equipment used: Rolling walker (2 wheeled) Transfers: Sit to/from [facility] Sit to Stand: Mod assist;+2 physical assistance         General transfer comment: due to dementia provided less physical assist and let pt use her normal technique. PTA held down RW and pt pulled up on it, modA given to achieve full upright posture.  Ambulation/Gait Ambulation/Gait assistance: Max assist;+2 physical assistance;+2 safety/equipment (+2 physical assist, thrid person w/ chair to follow.) Ambulation Distance (Feet): 6 Feet Assistive device: Rolling walker (2 wheeled) Gait Pattern/deviations: Step-to  pattern;Decreased step length - right;Decreased step length - left;Decreased dorsiflexion - right;Decreased dorsiflexion - left;Shuffle;Antalgic Gait velocity: decreased Gait velocity interpretation: Below normal speed for age/gender General Gait Details: Pt unable to fully extend LEs during gait requiring Max A +2 to remain standing during ambulation. Max encouragement needed to continue stepping. Chair close to follow as pt will try to sit without checking for chair.   Stairs            Wheelchair Mobility    Modified Rankin (Stroke Patients Only)       Balance Overall balance assessment: Needs assistance Sitting-balance support: Feet supported;No upper extremity supported Sitting balance-Leahy Scale: Fair     Standing balance support: During functional activity Standing balance-Leahy Scale: Poor Standing balance comment: needs RW and physical assist                            Cognition Arousal/Alertness: Awake/alert Behavior During Therapy: Restless (very fidgety) Overall Cognitive Status: History of cognitive impairments - at baseline Area of Impairment: Attention;Memory;Problem solving;Following commands                   Current Attention Level: Sustained Memory: Decreased short-term memory Following Commands: Follows one step commands inconsistently;Follows one step commands with increased time     Problem Solving: Slow processing;Decreased initiation;Difficulty sequencing;Requires verbal cues General Comments: dementia, Plesent this morning but disoriented.      Exercises      General Comments        Pertinent Vitals/Pain Pain Assessment: Faces Faces Pain Scale: Hurts even more Pain Location: R hip with movement, no sign of discomfort at rest Pain  Descriptors / Indicators: Discomfort;Grimacing;Guarding Pain Intervention(s): Monitored during session;Limited activity within patient's tolerance;Premedicated before session    Home  Living                      Prior Function            PT Goals (current goals can now be found in the care plan section) Acute Rehab PT Goals Patient Stated Goal: didn't state PT Goal Formulation: With family Time For Goal Achievement: 10/13/16 Potential to Achieve Goals: Good Progress towards PT goals: Progressing toward goals    Frequency    Min 3X/week      PT Plan Current plan remains appropriate    Co-evaluation              AM-PAC PT "6 Clicks" Daily Activity  Outcome Measure  Difficulty turning over in bed (including adjusting bedclothes, sheets and blankets)?: Total Difficulty moving from lying on back to sitting on the side of the bed? : Total Difficulty sitting down on and standing up from a chair with arms (e.g., wheelchair, bedside commode, etc,.)?: Total Help needed moving to and from a bed to chair (including a wheelchair)?: A Lot Help needed walking in hospital room?: A Lot Help needed climbing 3-5 steps with a railing? : Total 6 Click Score: 8    End of Session Equipment Utilized During Treatment: Gait belt Activity Tolerance: Patient tolerated treatment well Patient left: in chair;with call bell/phone within reach;with nursing/sitter in room (OT present) Nurse Communication: Mobility status PT Visit Diagnosis: Unsteadiness on feet (R26.81);History of falling (Z91.81);Muscle weakness (generalized) (M62.81);Pain Pain - Right/Left: Right Pain - part of body: Hip     Time: 4356-8616 PT Time Calculation (min) (ACUTE ONLY): 10 min  Charges:  $Gait Training: 8-22 mins                    G Codes:       Benjiman Core, [place] Pager [phone] Acute Rehab   Allena Katz 09/30/2016, 9:51 AM

## 2016-09-30 NOTE — Progress Notes (Signed)
[facility] did not answer phone when attempting to call report. Name and number to reach this RN left on msg machine.

## 2016-09-30 NOTE — Progress Notes (Signed)
Report called to  [facility] as pt will be transported there today for rehab therapy. All belongings with pt. No distress noted.

## 2016-09-30 NOTE — NC FL2 (Signed)
[place] LEVEL OF CARE SCREENING TOOL     IDENTIFICATION  Patient Name: Cynthia Wright Birthdate: [birth year] Sex: female Admission Date (Current Location): 09/27/2016  [facility] and [place] Number:  Herbalist and Address:  [facility], [place] [address] [place], [place] [address]      Provider Number: [id number]  Attending Physician Name and Address:  Barton Dubois, MD  Relative Name and Phone Number:  son,  Ksenia Kunz, [phone]    Current Level of Care: Hospital Recommended Level of Care: [place] Prior Approval Number:    Date Approved/Denied: 09/30/16 PASRR Number: [id number]  Discharge Plan: SNF    Current Diagnoses: Patient Active Problem List   Diagnosis Date Noted  . Closed displaced fracture of right femoral neck ([place]) 09/28/2016  . Fracture of left leg 09/27/2016  . Fall 07/12/2016  . Leukocytosis 07/12/2016  . Type 2 diabetes mellitus with hyperlipidemia ([place]) 07/12/2016  . History of atrial fibrillation 07/12/2016  . Acetabular fracture ([place]) 07/11/2016  . Dilated cardiomyopathy secondary to tachycardia ([place]) 12/14/2013  . Syncope 06/14/2013  . Bradycardia 05/11/2013  . Fatigue 05/11/2013  . Chronic diastolic CHF (congestive heart failure) ([place]) 01/06/2013  . Mitral regurgitation 08/06/2012  . Persistent atrial fibrillation ([place]) 08/02/2012  . Essential hypertension, benign 08/02/2012  . Pure hypercholesterolemia 08/02/2012  . Liver cyst 08/02/2012    Orientation RESPIRATION BLADDER Height & Weight     Self  O2 Incontinent Weight: 132 lb 4.4 oz (60 kg) Height:  5\' 5"  (165.1 cm)  BEHAVIORAL SYMPTOMS/MOOD NEUROLOGICAL BOWEL NUTRITION STATUS      Continent Diet (See DC Summary)  AMBULATORY STATUS COMMUNICATION OF NEEDS Skin   Extensive Assist Verbally Surgical wounds (Closed Left Leg Incision with Silicone Dressing)                       Personal Care Assistance  Level of Assistance  Bathing, Feeding, Dressing Bathing Assistance: Maximum assistance Feeding assistance: Limited assistance Dressing Assistance: Maximum assistance     Functional Limitations Info  Sight, Hearing, Speech Sight Info: Adequate Hearing Info: Adequate Speech Info: Adequate    SPECIAL CARE FACTORS FREQUENCY  PT (By licensed PT), OT (By licensed OT)     PT Frequency: 5xweek OT Frequency: 5xweek            Contractures      Additional Factors Info  Code Status, Allergies Code Status Info: Full Code Allergies Info: CODEINE, LORTAB HYDROCODONE-ACETAMINOPHEN            Current Medications (09/30/2016):  This is the current hospital active medication list Current Facility-Administered Medications  Medication Dose Route Frequency Provider Last Rate Last Dose  . 0.9 %  sodium chloride infusion   Intravenous Continuous Leandrew Koyanagi, MD 125 mL/hr at 09/28/16 1640    . acetaminophen (TYLENOL) tablet 650 mg  650 mg Oral Q6H PRN Leandrew Koyanagi, MD   650 mg at 09/30/16 0445   Or  . acetaminophen (TYLENOL) suppository 650 mg  650 mg Rectal Q6H PRN Leandrew Koyanagi, MD      . alum & mag hydroxide-simeth (MAALOX/MYLANTA) 200-200-20 MG/5ML suspension 30 mL  30 mL Oral Q4H PRN Leandrew Koyanagi, MD      . amiodarone (PACERONE) tablet 100 mg  100 mg Oral Daily Leandrew Koyanagi, MD   100 mg at 09/30/16 3532  . apixaban (ELIQUIS) tablet 2.5 mg  2.5 mg Oral BID Erlinda Hong,  Marylynn Pearson, MD   2.5 mg at 09/30/16 0823  . ciprofloxacin (CIPRO) tablet 250 mg  250 mg Oral BID Barton Dubois, MD   250 mg at 09/30/16 0981  . insulin aspart (novoLOG) injection 0-9 Units  0-9 Units Subcutaneous TID WC Barton Dubois, MD   1 Units at 09/30/16 0800  . lisinopril (PRINIVIL,ZESTRIL) tablet 20 mg  20 mg Oral Daily Leandrew Koyanagi, MD   20 mg at 09/30/16 1914  . menthol-cetylpyridinium (CEPACOL) lozenge 3 mg  1 lozenge Oral PRN Leandrew Koyanagi, MD       Or  . phenol (CHLORASEPTIC) mouth spray 1 spray  1 spray  Mouth/Throat PRN Leandrew Koyanagi, MD      . methocarbamol (ROBAXIN) tablet 500 mg  500 mg Oral Q6H PRN Leandrew Koyanagi, MD       Or  . methocarbamol (ROBAXIN) 500 mg in dextrose 5 % 50 mL IVPB  500 mg Intravenous Q6H PRN Leandrew Koyanagi, MD      . morphine 4 MG/ML injection 1 mg  1 mg Intravenous Q4H PRN Barton Dubois, MD      . ondansetron [facility]) tablet 4 mg  4 mg Oral Q6H PRN Leandrew Koyanagi, MD       Or  . ondansetron [facility]) injection 4 mg  4 mg Intravenous Q6H PRN Leandrew Koyanagi, MD      . oxyCODONE (Oxy IR/ROXICODONE) immediate release tablet 5-10 mg  5-10 mg Oral Q6H PRN Barton Dubois, MD   10 mg at 09/30/16 0445  . potassium chloride SA (K-DUR,KLOR-CON) CR tablet 40 mEq  40 mEq Oral Geanie Cooley, MD   40 mEq at 09/29/16 0959  . risperiDONE (RISPERDAL) tablet 0.25 mg  0.25 mg Oral QHS Barton Dubois, MD   0.25 mg at 09/29/16 2131  . rosuvastatin (CRESTOR) tablet 5 mg  5 mg Oral QHS Leandrew Koyanagi, MD   5 mg at 09/29/16 2131     Discharge Medications: Please see discharge summary for a list of discharge medications.  Relevant Imaging Results:  Relevant Lab Results:   Additional Information SS: [id number]  Normajean Baxter, LCSW

## 2016-09-30 NOTE — Care Management Important Message (Signed)
Important Message  Patient Details  Name: Cynthia Wright MRN: [id number] Date of Birth: 1924-10-17   Medicare Important Message Given:  Yes    Orbie Pyo 09/30/2016, 12:56 PM

## 2016-09-30 NOTE — Progress Notes (Signed)
[facility] here to transport pt to [facility]. All personal belongings with pt.

## 2016-10-01 DIAGNOSIS — R410 Disorientation, unspecified: Secondary | ICD-10-CM | POA: Diagnosis not present

## 2016-10-01 DIAGNOSIS — I5030 Unspecified diastolic (congestive) heart failure: Secondary | ICD-10-CM | POA: Diagnosis not present

## 2016-10-01 DIAGNOSIS — G301 Alzheimer's disease with late onset: Secondary | ICD-10-CM | POA: Diagnosis not present

## 2016-10-01 DIAGNOSIS — S72001H Fracture of unspecified part of neck of right femur, subsequent encounter for open fracture type I or II with delayed healing: Secondary | ICD-10-CM | POA: Diagnosis not present

## 2016-10-01 DIAGNOSIS — I4891 Unspecified atrial fibrillation: Secondary | ICD-10-CM | POA: Diagnosis not present

## 2016-10-01 DIAGNOSIS — E119 Type 2 diabetes mellitus without complications: Secondary | ICD-10-CM | POA: Diagnosis not present

## 2016-10-01 DIAGNOSIS — D649 Anemia, unspecified: Secondary | ICD-10-CM | POA: Diagnosis not present

## 2016-10-03 ENCOUNTER — Telehealth (INDEPENDENT_AMBULATORY_CARE_PROVIDER_SITE_OTHER): Payer: Self-pay | Admitting: *Deleted

## 2016-10-03 NOTE — Telephone Encounter (Signed)
-----   Message from Roosvelt Maser sent at 10/02/2016  4:06 PM EDT ----- Regarding: RE: [place] Afternoon! So as it turns out, this patient ended  up going to [facility]. I have reached out to  the Discharge Planner at the SNF. She will let me  know when the patient goes home and [facility] will  begin services at that time. Again, thanks for  referring to [facility]!  ----- Message ----- From: Maureen Chatters, CMA Sent: 10/02/2016  10:26 AM To: Roosvelt Maser Subject: HOME HEALTH                                    Can you please evaluate pt and advise me of appt.   Thanks

## 2016-10-03 NOTE — Telephone Encounter (Signed)
-----   Message from Roosvelt Maser sent at 10/02/2016 12:28 PM EDT ----- Regarding: RE: [place]! [facility] would be happy to help Ms.  Cynthia Wright. Thank you for referring her. We'll  get the ball rolling on her immediately.   ----- Message ----- From: Maureen Chatters, CMA Sent: 10/02/2016  10:26 AM To: Roosvelt Maser Subject: HOME HEALTH                                    Can you please evaluate pt and advise me of appt.   Thanks

## 2016-10-06 ENCOUNTER — Telehealth (INDEPENDENT_AMBULATORY_CARE_PROVIDER_SITE_OTHER): Payer: Self-pay | Admitting: Orthopaedic Surgery

## 2016-10-06 NOTE — Telephone Encounter (Signed)
Patient's son Shanon Brow called advised [facility] faxed a form to be completed for his mother to receive a wheelchair. Shanon Brow said [facility] have not heard or received the fax back. The number to contact Shanon Brow is [phone]

## 2016-10-07 ENCOUNTER — Ambulatory Visit (INDEPENDENT_AMBULATORY_CARE_PROVIDER_SITE_OTHER): Payer: Medicare Other | Admitting: Orthopaedic Surgery

## 2016-10-07 NOTE — Telephone Encounter (Signed)
Asked to fax it again to fax number [phone]

## 2016-10-14 ENCOUNTER — Encounter (HOSPITAL_COMMUNITY): Payer: Self-pay

## 2016-10-14 ENCOUNTER — Emergency Department (HOSPITAL_COMMUNITY): Payer: Medicare Other

## 2016-10-14 ENCOUNTER — Inpatient Hospital Stay (INDEPENDENT_AMBULATORY_CARE_PROVIDER_SITE_OTHER): Payer: Medicare Other | Admitting: Orthopaedic Surgery

## 2016-10-14 ENCOUNTER — Inpatient Hospital Stay (HOSPITAL_COMMUNITY)
Admission: EM | Admit: 2016-10-14 | Discharge: 2016-10-19 | DRG: 309 | Disposition: A | Discharge status: Skilled Nursing Facility | Payer: Medicare Other | Attending: Nephrology | Admitting: Nephrology

## 2016-10-14 ENCOUNTER — Observation Stay (HOSPITAL_COMMUNITY): Payer: Medicare Other

## 2016-10-14 DIAGNOSIS — Z885 Allergy status to narcotic agent status: Secondary | ICD-10-CM

## 2016-10-14 DIAGNOSIS — Z87891 Personal history of nicotine dependence: Secondary | ICD-10-CM

## 2016-10-14 DIAGNOSIS — I420 Dilated cardiomyopathy: Secondary | ICD-10-CM | POA: Diagnosis not present

## 2016-10-14 DIAGNOSIS — D649 Anemia, unspecified: Secondary | ICD-10-CM | POA: Diagnosis present

## 2016-10-14 DIAGNOSIS — I4891 Unspecified atrial fibrillation: Secondary | ICD-10-CM | POA: Diagnosis present

## 2016-10-14 DIAGNOSIS — E785 Hyperlipidemia, unspecified: Secondary | ICD-10-CM | POA: Diagnosis not present

## 2016-10-14 DIAGNOSIS — S72001D Fracture of unspecified part of neck of right femur, subsequent encounter for closed fracture with routine healing: Secondary | ICD-10-CM

## 2016-10-14 DIAGNOSIS — Z96641 Presence of right artificial hip joint: Secondary | ICD-10-CM | POA: Diagnosis present

## 2016-10-14 DIAGNOSIS — E119 Type 2 diabetes mellitus without complications: Secondary | ICD-10-CM | POA: Diagnosis present

## 2016-10-14 DIAGNOSIS — Z8673 Personal history of transient ischemic attack (TIA), and cerebral infarction without residual deficits: Secondary | ICD-10-CM

## 2016-10-14 DIAGNOSIS — F0391 Unspecified dementia with behavioral disturbance: Secondary | ICD-10-CM | POA: Diagnosis present

## 2016-10-14 DIAGNOSIS — Z7984 Long term (current) use of oral hypoglycemic drugs: Secondary | ICD-10-CM

## 2016-10-14 DIAGNOSIS — I480 Paroxysmal atrial fibrillation: Secondary | ICD-10-CM | POA: Diagnosis present

## 2016-10-14 DIAGNOSIS — J189 Pneumonia, unspecified organism: Secondary | ICD-10-CM

## 2016-10-14 DIAGNOSIS — J9811 Atelectasis: Secondary | ICD-10-CM | POA: Diagnosis not present

## 2016-10-14 DIAGNOSIS — Z8249 Family history of ischemic heart disease and other diseases of the circulatory system: Secondary | ICD-10-CM

## 2016-10-14 DIAGNOSIS — I5032 Chronic diastolic (congestive) heart failure: Secondary | ICD-10-CM | POA: Diagnosis present

## 2016-10-14 DIAGNOSIS — M19041 Primary osteoarthritis, right hand: Secondary | ICD-10-CM | POA: Diagnosis not present

## 2016-10-14 DIAGNOSIS — D509 Iron deficiency anemia, unspecified: Secondary | ICD-10-CM | POA: Diagnosis present

## 2016-10-14 DIAGNOSIS — Z7901 Long term (current) use of anticoagulants: Secondary | ICD-10-CM

## 2016-10-14 DIAGNOSIS — I959 Hypotension, unspecified: Secondary | ICD-10-CM | POA: Diagnosis present

## 2016-10-14 DIAGNOSIS — I110 Hypertensive heart disease with heart failure: Secondary | ICD-10-CM | POA: Diagnosis present

## 2016-10-14 DIAGNOSIS — Z9181 History of falling: Secondary | ICD-10-CM

## 2016-10-14 DIAGNOSIS — A419 Sepsis, unspecified organism: Secondary | ICD-10-CM

## 2016-10-14 DIAGNOSIS — E1169 Type 2 diabetes mellitus with other specified complication: Secondary | ICD-10-CM | POA: Diagnosis present

## 2016-10-14 DIAGNOSIS — K5641 Fecal impaction: Secondary | ICD-10-CM | POA: Diagnosis present

## 2016-10-14 DIAGNOSIS — I481 Persistent atrial fibrillation: Secondary | ICD-10-CM | POA: Diagnosis not present

## 2016-10-14 DIAGNOSIS — Z800 Family history of malignant neoplasm of digestive organs: Secondary | ICD-10-CM

## 2016-10-14 DIAGNOSIS — R000 Tachycardia, unspecified: Secondary | ICD-10-CM | POA: Diagnosis not present

## 2016-10-14 LAB — COMPREHENSIVE METABOLIC PANEL
ALK PHOS: 85 U/L (ref 38–126)
ALT: 11 U/L — AB (ref 14–54)
AST: 14 U/L — AB (ref 15–41)
Albumin: 2.5 g/dL — ABNORMAL LOW (ref 3.5–5.0)
Anion gap: 7 (ref 5–15)
BUN: 15 mg/dL (ref 6–20)
CALCIUM: 8.1 mg/dL — AB (ref 8.9–10.3)
CO2: 27 mmol/L (ref 22–32)
CREATININE: 1.11 mg/dL — AB (ref 0.44–1.00)
Chloride: 98 mmol/L — ABNORMAL LOW (ref 101–111)
GFR calc Af Amer: 50 mL/min — ABNORMAL LOW (ref >60)
GFR, EST NON AFRICAN AMERICAN: 43 mL/min — AB (ref >60)
Glucose, Bld: 211 mg/dL — ABNORMAL HIGH (ref 65–99)
Potassium: 5.1 mmol/L (ref 3.5–5.1)
Sodium: 132 mmol/L — ABNORMAL LOW (ref 135–145)
Total Bilirubin: 0.5 mg/dL (ref 0.3–1.2)
Total Protein: 5.2 g/dL — ABNORMAL LOW (ref 6.5–8.1)

## 2016-10-14 LAB — CBC WITH DIFFERENTIAL/PLATELET
BASOS ABS: 0 10*3/uL (ref 0.0–0.1)
Basophils Relative: 0 %
EOS PCT: 0 %
Eosinophils Absolute: 0.1 10*3/uL (ref 0.0–0.7)
HCT: 32.7 % — ABNORMAL LOW (ref 36.0–46.0)
HEMOGLOBIN: 10.3 g/dL — AB (ref 12.0–15.0)
LYMPHS ABS: 0.8 10*3/uL (ref 0.7–4.0)
LYMPHS PCT: 6 %
MCH: 32.1 pg (ref 26.0–34.0)
MCHC: 31.5 g/dL (ref 30.0–36.0)
MCV: 101.9 fL — AB (ref 78.0–100.0)
Monocytes Absolute: 0.8 10*3/uL (ref 0.1–1.0)
Monocytes Relative: 7 %
NEUTROS PCT: 87 %
Neutro Abs: 10.8 10*3/uL — ABNORMAL HIGH (ref 1.7–7.7)
PLATELETS: 418 10*3/uL — AB (ref 150–400)
RBC: 3.21 MIL/uL — AB (ref 3.87–5.11)
RDW: 14.1 % (ref 11.5–15.5)
WBC: 12.4 10*3/uL — AB (ref 4.0–10.5)

## 2016-10-14 LAB — I-STAT CHEM 8, ED
BUN: 16 mg/dL (ref 6–20)
CHLORIDE: 96 mmol/L — AB (ref 101–111)
CREATININE: 1 mg/dL (ref 0.44–1.00)
Calcium, Ion: 1.04 mmol/L — ABNORMAL LOW (ref 1.15–1.40)
GLUCOSE: 210 mg/dL — AB (ref 65–99)
HCT: 29 % — ABNORMAL LOW (ref 36.0–46.0)
Hemoglobin: 9.9 g/dL — ABNORMAL LOW (ref 12.0–15.0)
POTASSIUM: 5 mmol/L (ref 3.5–5.1)
Sodium: 133 mmol/L — ABNORMAL LOW (ref 135–145)
TCO2: 29 mmol/L (ref 0–100)

## 2016-10-14 LAB — GLUCOSE, CAPILLARY: Glucose-Capillary: 177 mg/dL — ABNORMAL HIGH (ref 65–99)

## 2016-10-14 LAB — MAGNESIUM: Magnesium: 2.5 mg/dL — ABNORMAL HIGH (ref 1.7–2.4)

## 2016-10-14 LAB — URINALYSIS, ROUTINE W REFLEX MICROSCOPIC
BILIRUBIN URINE: NEGATIVE
Glucose, UA: NEGATIVE mg/dL
HGB URINE DIPSTICK: NEGATIVE
KETONES UR: NEGATIVE mg/dL
Leukocytes, UA: NEGATIVE
Nitrite: NEGATIVE
Protein, ur: NEGATIVE mg/dL
SPECIFIC GRAVITY, URINE: 1.013 (ref 1.005–1.030)
pH: 5 (ref 5.0–8.0)

## 2016-10-14 LAB — I-STAT TROPONIN, ED: Troponin i, poc: 0.01 ng/mL (ref 0.00–0.08)

## 2016-10-14 LAB — BRAIN NATRIURETIC PEPTIDE: B Natriuretic Peptide: 238.8 pg/mL — ABNORMAL HIGH (ref 0.0–100.0)

## 2016-10-14 LAB — I-STAT CG4 LACTIC ACID, ED: LACTIC ACID, VENOUS: 1.17 mmol/L (ref 0.5–1.9)

## 2016-10-14 MED ORDER — VANCOMYCIN HCL IN DEXTROSE 750-5 MG/150ML-% IV SOLN
750.0000 mg | INTRAVENOUS | Status: DC
  Administered 2016-10-15: 750 mg via INTRAVENOUS
  Filled 2016-10-14 (×2): qty 150

## 2016-10-14 MED ORDER — INSULIN ASPART 100 UNIT/ML ~~LOC~~ SOLN
0.0000 [IU] | Freq: Three times a day (TID) | SUBCUTANEOUS | Status: DC
  Administered 2016-10-15 (×2): 2 [IU] via SUBCUTANEOUS
  Administered 2016-10-15 – 2016-10-16 (×2): 1 [IU] via SUBCUTANEOUS
  Administered 2016-10-16 (×2): 2 [IU] via SUBCUTANEOUS
  Administered 2016-10-17: 1 [IU] via SUBCUTANEOUS
  Administered 2016-10-17: 2 [IU] via SUBCUTANEOUS
  Administered 2016-10-18 (×2): 1 [IU] via SUBCUTANEOUS

## 2016-10-14 MED ORDER — SODIUM CHLORIDE 0.9 % IV BOLUS (SEPSIS)
500.0000 mL | Freq: Once | INTRAVENOUS | Status: AC
  Administered 2016-10-14: 500 mL via INTRAVENOUS

## 2016-10-14 MED ORDER — SODIUM CHLORIDE 0.9 % IV SOLN: 250.0000 mL | INTRAVENOUS | Status: DC | PRN

## 2016-10-14 MED ORDER — POLYSACCHARIDE IRON COMPLEX 150 MG PO CAPS
150.0000 mg | ORAL_CAPSULE | Freq: Every day | ORAL | Status: DC
  Administered 2016-10-15 – 2016-10-19 (×5): 150 mg via ORAL
  Filled 2016-10-14 (×5): qty 1

## 2016-10-14 MED ORDER — AMIODARONE HCL 100 MG PO TABS
100.0000 mg | ORAL_TABLET | Freq: Every day | ORAL | Status: DC
  Administered 2016-10-15: 100 mg via ORAL
  Filled 2016-10-14: qty 1

## 2016-10-14 MED ORDER — ONDANSETRON HCL 4 MG/2ML IJ SOLN
4.0000 mg | Freq: Four times a day (QID) | INTRAMUSCULAR | Status: DC | PRN
Start: 2016-10-14 — End: 2016-10-19

## 2016-10-14 MED ORDER — DEXTROSE 5 % IV SOLN
2.0000 g | Freq: Once | INTRAVENOUS | Status: AC
  Administered 2016-10-14: 2 g via INTRAVENOUS
  Filled 2016-10-14: qty 2

## 2016-10-14 MED ORDER — DEXTROSE 5 % IV SOLN
1.0000 g | INTRAVENOUS | Status: DC
  Administered 2016-10-15: 1 g via INTRAVENOUS
  Filled 2016-10-14 (×2): qty 1

## 2016-10-14 MED ORDER — RISPERIDONE 0.25 MG PO TABS
0.2500 mg | ORAL_TABLET | Freq: Every day | ORAL | Status: DC
  Administered 2016-10-14: 0.25 mg via ORAL
  Filled 2016-10-14: qty 1

## 2016-10-14 MED ORDER — ROSUVASTATIN CALCIUM 10 MG PO TABS
5.0000 mg | ORAL_TABLET | Freq: Every day | ORAL | Status: DC
  Administered 2016-10-14 – 2016-10-18 (×5): 5 mg via ORAL
  Filled 2016-10-14 (×5): qty 1

## 2016-10-14 MED ORDER — INSULIN ASPART 100 UNIT/ML ~~LOC~~ SOLN: 0.0000 [IU] | Freq: Every day | SUBCUTANEOUS | Status: DC

## 2016-10-14 MED ORDER — AMIODARONE IV BOLUS ONLY 150 MG/100ML
150.0000 mg | Freq: Once | INTRAVENOUS | Status: AC
  Administered 2016-10-14: 150 mg via INTRAVENOUS
  Filled 2016-10-14: qty 100

## 2016-10-14 MED ORDER — ACETAMINOPHEN 650 MG RE SUPP: 650.0000 mg | Freq: Four times a day (QID) | RECTAL | Status: DC | PRN

## 2016-10-14 MED ORDER — ONDANSETRON HCL 4 MG PO TABS: 4.0000 mg | ORAL_TABLET | Freq: Four times a day (QID) | ORAL | Status: DC | PRN

## 2016-10-14 MED ORDER — LORAZEPAM BOLUS VIA INFUSION: 0.5000 mg | Freq: Once | INTRAVENOUS | Status: DC

## 2016-10-14 MED ORDER — LORAZEPAM 2 MG/ML IJ SOLN
0.5000 mg | Freq: Once | INTRAMUSCULAR | Status: AC
  Administered 2016-10-14: 0.5 mg via INTRAVENOUS
  Filled 2016-10-14: qty 1

## 2016-10-14 MED ORDER — SODIUM CHLORIDE 0.9% FLUSH
3.0000 mL | Freq: Two times a day (BID) | INTRAVENOUS | Status: DC
  Administered 2016-10-15 – 2016-10-17 (×4): 3 mL via INTRAVENOUS

## 2016-10-14 MED ORDER — SODIUM CHLORIDE 0.9% FLUSH: 3.0000 mL | INTRAVENOUS | Status: DC | PRN

## 2016-10-14 MED ORDER — AMIODARONE LOAD VIA INFUSION
150.0000 mg | Freq: Once | INTRAVENOUS | Status: DC
  Filled 2016-10-14: qty 83.34

## 2016-10-14 MED ORDER — APIXABAN 2.5 MG PO TABS
2.5000 mg | ORAL_TABLET | Freq: Two times a day (BID) | ORAL | Status: DC
  Administered 2016-10-14 – 2016-10-19 (×10): 2.5 mg via ORAL
  Filled 2016-10-14 (×10): qty 1

## 2016-10-14 MED ORDER — SODIUM CHLORIDE 0.9% FLUSH
3.0000 mL | Freq: Two times a day (BID) | INTRAVENOUS | Status: DC
  Administered 2016-10-14 – 2016-10-18 (×4): 3 mL via INTRAVENOUS

## 2016-10-14 MED ORDER — ACETAMINOPHEN 325 MG PO TABS
650.0000 mg | ORAL_TABLET | Freq: Four times a day (QID) | ORAL | Status: DC | PRN
  Administered 2016-10-19: 650 mg via ORAL
  Filled 2016-10-14: qty 2

## 2016-10-14 MED ORDER — VANCOMYCIN HCL IN DEXTROSE 1-5 GM/200ML-% IV SOLN
1000.0000 mg | Freq: Once | INTRAVENOUS | Status: AC
  Administered 2016-10-14: 1000 mg via INTRAVENOUS
  Filled 2016-10-14: qty 200

## 2016-10-14 MED ORDER — OXYCODONE HCL 5 MG PO TABS: 5.0000 mg | ORAL_TABLET | ORAL | Status: DC | PRN

## 2016-10-14 NOTE — Progress Notes (Signed)
Pharmacy Antibiotic Note  Cynthia Wright is a 83 y.o. female admitted on 10/14/2016 with pneumonia.  Pharmacy has been consulted for vancomycin and cefepime dosing. Pt is afebrile and WBC is elevated at 12.4. Scr is 1.11. Lactic acid is WNL.   Plan: Vancomycin 1gm IV x 1 then 750mg  IV Q24H Cefepime 2gm IV x 1 then 1gm IV Q24H F/u renal fxn, C&S, clinical status and trough at SS  Height: 5\' 5"  (165.1 cm) Weight: 132 lb (59.9 kg) IBW/kg (Calculated) : 57  Temp (24hrs), Avg:98.3 F (36.8 C), Min:98.3 F (36.8 C), Max:98.3 F (36.8 C)   Recent Labs Lab 10/14/16 1345 10/14/16 1400 10/14/16 1646  WBC 12.4*  --   --   CREATININE 1.11* 1.00  --   LATICACIDVEN  --   --  1.17    Estimated Creatinine Clearance: 34.3 mL/min (by C-G formula based on SCr of 1 mg/dL).    Allergies  Allergen Reactions  . Codeine Nausea And Vomiting  . Lortab [Hydrocodone-Acetaminophen] Nausea And Vomiting    Antimicrobials this admission: Vanc 7/17>> Cefepime 7/17>>  Dose adjustments this admission: N/A  Microbiology results: Pending  Thank you for allowing pharmacy to be a part of this patient's care.  Jeevan Kalla, Rande Lawman 10/14/2016 4:49 PM

## 2016-10-14 NOTE — ED Provider Notes (Signed)
[facility] DEPT Provider Note   CSN: [id number] Arrival date & time: 10/14/16  1319     History   Chief Complaint Chief Complaint  Patient presents with  . Chest Pain    HPI Cynthia Wright is a 83 y.o. female.  HPI    83 year old female with a history of dementia, diabetes, chronic diastolic CHF, paroxysmal atrial fibrillation on eliquis and amiodarone, hypertension, hyperlipidemia, recent hospitalization for right hip fracture presents from [facility] with concern for chest pain, dyspnea, atrial fibrillation with rapid heart rate.  Patient an unreliable historian, however denies any symptoms on arrival to the ED.    Patient does not remember having chest pain or dyspnea and reports feeling normal at this time. Family reports she has short term memory loss.  Per EMS, patient had reported chest pain and dyspnea to the staff at the facility. They initially gave her 30 mg of Cardizem by mouth for elevated heart rate, and also gave her nitroglycerin for chest pain. Her symptoms persisted into the afternoon, and she had a heart rate to the 130s, so he this facility called EMS.    The facility had also seen blood in the patient's pad this morning. Her family reports that she's had some pain with bowel movements, history of hemorrhoidal bleeding. Denies black, tarry, or maroon-colored stools.  Family reports that she has not been eating or drinking well. She had a fall yesterday and caught herself with her hand, but denied any other injuries, including no head trauma or LOC.   Patient reports occasional cough to clear her throat but no significant cough. No known fever.  No urinary symptoms, vomiting or diarrhea.   Past Medical History:  Diagnosis Date  . Atrial fibrillation ([place])    s/p DCCV 07/2012  . Benign diastolic hypertension   . Chronic anticoagulation   . Chronic diastolic CHF (congestive heart failure) (HCC)    EF now 65% by echo 10/2012 with grade II diasotlid  dysfunction  . Diabetes mellitus without complication ([place])   . Dilated cardiomyopathy secondary to tachycardia ([place]) 07/2012   2D echo 10/2012 showed EF 65% with grade II diastolic dysfunction  . Elevated cholesterol   . Hyperlipidemia   . Hypertension   . Liver cyst   . Memory loss     Patient Active Problem List   Diagnosis Date Noted  . AF (paroxysmal atrial fibrillation) ([place])   . Dementia with behavioral disturbance   . Hypokalemia   . Postoperative anemia   . Hyperlipidemia   . E. coli UTI   . Closed displaced fracture of right femoral neck ([place]) 09/28/2016  . Fracture of left leg 09/27/2016  . Fall 07/12/2016  . Leukocytosis 07/12/2016  . Type 2 diabetes mellitus with hyperlipidemia ([place]) 07/12/2016  . History of atrial fibrillation 07/12/2016  . Acetabular fracture ([place]) 07/11/2016  . Dilated cardiomyopathy secondary to tachycardia ([place]) 12/14/2013  . Syncope 06/14/2013  . Bradycardia 05/11/2013  . Fatigue 05/11/2013  . Chronic diastolic CHF (congestive heart failure) ([place]) 01/06/2013  . Mitral regurgitation 08/06/2012  . Persistent atrial fibrillation ([place]) 08/02/2012  . Benign essential HTN 08/02/2012  . Pure hypercholesterolemia 08/02/2012  . Liver cyst 08/02/2012    Past Surgical History:  Procedure Laterality Date  . ANTERIOR APPROACH HEMI HIP ARTHROPLASTY Right 09/28/2016   Procedure: ANTERIOR APPROACH HEMI HIP ARTHROPLASTY;  Surgeon: Leandrew Koyanagi, MD;  Location: [facility];  Service: Orthopedics;  Laterality: Right;  . APPENDECTOMY    . BACK SURGERY  herniated disc  . CARDIOVERSION N/A 08/09/2012   Procedure: CARDIOVERSION;  Surgeon: Sueanne Margarita, MD;  Location: [facility];  Service: Cardiovascular;  Laterality: N/A;  . CARDIOVERSION N/A 09/28/2014   Procedure: CARDIOVERSION;  Surgeon: Sueanne Margarita, MD;  Location: [facility] ENDOSCOPY;  Service: Cardiovascular;  Laterality: N/A;  . TEE WITHOUT CARDIOVERSION N/A 08/09/2012   Procedure: TRANSESOPHAGEAL  ECHOCARDIOGRAM (TEE);  Surgeon: Sueanne Margarita, MD;  Location: [facility] ENDOSCOPY;  Service: Cardiovascular;  Laterality: N/A;  talk to angie in [place].  . TONSILLECTOMY      OB History    No data available       Home Medications    Prior to Admission medications   Medication Sig Start Date End Date Taking? Authorizing Provider  amiodarone (PACERONE) 100 MG tablet Take 100 mg by mouth daily.    [provider]  ELIQUIS 2.5 MG TABS tablet TAKE 1 TABLET (2.5 MG TOTAL) BY MOUTH 2 (TWO) TIMES DAILY. 04/21/16   Sueanne Margarita, MD  furosemide (LASIX) 20 MG tablet Take 20 mg by mouth daily as needed for edema.     [provider]  iron polysaccharides (NIFEREX) 150 MG capsule Take 1 capsule (150 mg total) by mouth daily. 09/30/16   Barton Dubois, MD  lisinopril (PRINIVIL,ZESTRIL) 40 MG tablet TAKE 1 TABLET (40 MG TOTAL) BY MOUTH DAILY. Patient taking differently: Take 20 mg by mouth daily. TAKE 1 TABLET (40 MG TOTAL) BY MOUTH DAILY. 04/22/16   Sueanne Margarita, MD  oxyCODONE (OXY IR/ROXICODONE) 5 MG immediate release tablet Take 1-3 tablets (5-15 mg total) by mouth every 4 (four) hours as needed. 09/28/16   Leandrew Koyanagi, MD  potassium chloride SA (KLOR-CON M20) 20 MEQ tablet Take 2 tablets (40 mEq total) by mouth every other day. Patient taking differently: Take 20 mEq by mouth 2 (two) times daily.  05/23/16   Sueanne Margarita, MD  risperiDONE (RISPERDAL) 0.25 MG tablet Take 1 tablet (0.25 mg total) by mouth at bedtime. 09/30/16   Barton Dubois, MD  rosuvastatin (CRESTOR) 5 MG tablet Take 5 mg by mouth at bedtime.     [provider]  sitaGLIPtin (JANUVIA) 100 MG tablet Take 100 mg by mouth daily.    [provider]    Family History Family History  Problem Relation Age of Onset  . Heart disease Mother   . CAD Mother   . Heart disease Father   . CAD Father   . Pancreatic cancer Brother     Social History Social History  Substance Use Topics  . Smoking status:  Former Research scientist (life sciences)  . Smokeless tobacco: Never Used  . Alcohol use 0.0 oz/week    1 - 2 Glasses of wine per week     Allergies   Codeine and Lortab [hydrocodone-acetaminophen]   Review of Systems Review of Systems  Unable to perform ROS: Dementia  Constitutional: Negative for fever.  Respiratory: Positive for shortness of breath. Cough: occasional to clear throat.   Cardiovascular: Positive for chest pain. Negative for leg swelling.  Gastrointestinal: Positive for anal bleeding (small amount bright red blood). Negative for diarrhea, nausea and vomiting.  Musculoskeletal: Positive for arthralgias.  Neurological: Negative for headaches.     Physical Exam Updated Vital Signs BP 119/68   Pulse (!) 114   Temp 98.3 F (36.8 C) (Oral)   Resp 19   Ht 5\' 5"  (1.651 m)   Wt 59.9 kg (132 lb)   SpO2 95%   BMI 21.97  kg/m   Physical Exam  Constitutional: She is oriented to person, place, and time. She appears well-developed and well-nourished. No distress.  HENT:  Head: Normocephalic and atraumatic.  Eyes: Conjunctivae and EOM are normal.  Neck: Normal range of motion.  Cardiovascular: Normal heart sounds and intact distal pulses.  An irregularly irregular rhythm present. Tachycardia present.  Exam reveals no gallop and no friction rub.   No murmur heard. Pulmonary/Chest: Effort normal and breath sounds normal. No respiratory distress. She has no wheezes. She has no rales.  Abdominal: Soft. She exhibits no distension. There is no tenderness. There is no guarding.  Musculoskeletal: She exhibits no edema or tenderness.  Neurological: She is alert and oriented to person, place, and time.  Skin: Skin is warm and dry. No rash noted. She is not diaphoretic. No erythema.  Nursing note and vitals reviewed.     ED Treatments / Results  Labs (all labs ordered are listed, but only abnormal results are displayed) Labs Reviewed  CBC WITH DIFFERENTIAL/PLATELET - Abnormal; Notable for the  following:       Result Value   WBC 12.4 (*)    RBC 3.21 (*)    Hemoglobin 10.3 (*)    HCT 32.7 (*)    MCV 101.9 (*)    Platelets 418 (*)    Neutro Abs 10.8 (*)    All other components within normal limits  COMPREHENSIVE METABOLIC PANEL - Abnormal; Notable for the following:    Sodium 132 (*)    Chloride 98 (*)    Glucose, Bld 211 (*)    Creatinine, Ser 1.11 (*)    Calcium 8.1 (*)    Total Protein 5.2 (*)    Albumin 2.5 (*)    AST 14 (*)    ALT 11 (*)    GFR calc non Af Amer 43 (*)    GFR calc Af Amer 50 (*)    All other components within normal limits  BRAIN NATRIURETIC PEPTIDE - Abnormal; Notable for the following:    B Natriuretic Peptide 238.8 (*)    All other components within normal limits  MAGNESIUM - Abnormal; Notable for the following:    Magnesium 2.5 (*)    All other components within normal limits  URINALYSIS, ROUTINE W REFLEX MICROSCOPIC - Abnormal; Notable for the following:    APPearance HAZY (*)    All other components within normal limits  I-STAT CHEM 8, ED - Abnormal; Notable for the following:    Sodium 133 (*)    Chloride 96 (*)    Glucose, Bld 210 (*)    Calcium, Ion 1.04 (*)    Hemoglobin 9.9 (*)    HCT 29.0 (*)    All other components within normal limits  CULTURE, BLOOD (ROUTINE X 2)  CULTURE, BLOOD (ROUTINE X 2)  I-STAT TROPOININ, ED  I-STAT CG4 LACTIC ACID, ED    EKG  EKG Interpretation  Date/Time:  Tuesday October 14 2016 13:24:58 EDT Ventricular Rate:  124 PR Interval:    QRS Duration: 95 QT Interval:  328 QTC Calculation: 472 R Axis:   112 Text Interpretation:  Atrial fibrillation with rapid rate Abnormal lateral Q waves Anterior infarct, old Since prior ECG, patient is now in atrial fibrillation and rate has increased Confirmed by Gareth Morgan [id number]) on 10/14/2016 1:29:49 PM       Radiology Dg Chest Portable 1 View  Result Date: 10/14/2016 CLINICAL DATA:  Tachycardia today. EXAM: PORTABLE CHEST 1 VIEW COMPARISON:  PA and  lateral chest 12/28/2012 and single view of the chest 08/02/2012. FINDINGS: There is left basilar airspace disease. The right lung is clear. The chest is hyperexpanded. No pneumothorax. Heart size is normal. Atherosclerosis noted. IMPRESSION: Left basilar airspace disease which could be due to atelectasis or infection. Recommend follow-up to clearing. Emphysema. Electronically Signed   By: Inge Rise M.D.   On: 10/14/2016 13:57    Procedures Procedures (including critical care time)  Medications Ordered in ED Medications  vancomycin (VANCOCIN) IVPB 1000 mg/200 mL premix (not administered)  ceFEPIme (MAXIPIME) 2 g in dextrose 5 % 50 mL IVPB (not administered)  sodium chloride 0.9 % bolus 500 mL (0 mLs Intravenous Stopped 10/14/16 1609)  amiodarone (NEXTERONE) IV bolus only 150 mg/100 mL (0 mg Intravenous Stopped 10/14/16 1608)     Initial Impression / Assessment and Plan / ED Course  I have reviewed the triage vital signs and the nursing notes.  Pertinent labs & imaging results that were available during my care of the patient were reviewed by me and considered in my medical decision making (see chart for details).     83 year old female with a history of diabetes, chronic diastolic CHF, paroxysmal atrial fibrillation on eliquis and amiodarone, hypertension, hyperlipidemia, recent hospitalization for right hip fracture presents from [facility] with concern for chest pain, dyspnea, atrial fibrillation with rapid heart rate.  Patient an unreliable historian, however denies any symptoms on arrival to the ED. Her heart rate is 130 with blood pressures down to 09T systolic.  Given concern that atrial fibrillation with RVR may be etiology of symptoms, and given borderline blood pressures, gave amiodarone to help control her atrial fibrillation.  Initially by history did not suspect sepsis, GI bleed, ACS or PE. Consulted cardiology for atrial fibrillation with RVR and relative  hypotension.  After amiodarone and 500cc NS, her heart rate has improved to 110s, her blood pressure is improved to 120s.  Her ECG now appears to have sinus tachycardia.  With work up obtained, consider other etiology of sinus tachycardia and potentially afib with RVR such as infection or dehydration. Doubt PE given patient on eliquis.  No sign of UTI. CXR shows concern for pneumonia vs atelectasis. Initially thought this to be atelectasis, however given concern for dyspnea, increased WBC, persistent tachycardia feel it may represent pneumonia and ordered lactic acid, blood cultures, vanc and cefepime for HCAP. Will admit for continued evaluation, treatment.  Cardiology consulted.   Final Clinical Impressions(s) / ED Diagnoses   Final diagnoses:  Paroxysmal atrial fibrillation ([place])  Healthcare-associated pneumonia    New Prescriptions New Prescriptions   No medications on file     Gareth Morgan, MD 10/14/16 1653

## 2016-10-14 NOTE — Consult Note (Signed)
Patient ID: Cynthia Wright MRN: [id number], DOB/AGE: 06-10-1924   Admit date: 10/14/2016   Primary Physician: Aretta Nip, MD Primary Cardiologist: Dr. Radford Pax   Reason for Consult: Atrial Fibrillation Requesting MD: Dr. Billy Fischer, ED  Pt. Profile:  83 y.o. 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, ED physician. Her CXR shows left basilar airspace disease concerning for possible PNA. IM to see for admission.   Problem List  Past Medical History:  Diagnosis Date  . Atrial fibrillation ([place])    s/p DCCV 07/2012  . Benign diastolic hypertension   . Chronic anticoagulation   . Chronic diastolic CHF (congestive heart failure) (H[facility])    EF now 65% by echo 10/2012 with grade II diasotlid dysfunction  . Diabetes mellitus without complication ([place])   . Dilated cardiomyopathy secondary to tachycardia ([place]) 07/2012   2D echo 10/2012 showed EF 65% with grade II diastolic dysfunction  . Elevated cholesterol   . Hyperlipidemia   . Hypertension   . Liver cyst   . Memory loss     Past Surgical History:  Procedure Laterality Date  . ANTERIOR APPROACH HEMI HIP ARTHROPLASTY Right 09/28/2016   Procedure: ANTERIOR APPROACH HEMI HIP ARTHROPLASTY;  Surgeon: Leandrew Koyanagi, MD;  Location: [place];  Service: Orthopedics;  Laterality: Right;  . APPENDECTOMY    . BACK SURGERY     herniated disc  . CARDIOVERSION N/A 08/09/2012   Procedure: CARDIOVERSION;  Surgeon: Sueanne Margarita, MD;  Location: [place];  Service: Cardiovascular;  Laterality: N/A;  . CARDIOVERSION N/A 09/28/2014   Procedure: CARDIOVERSION;  Surgeon: Sueanne Margarita, MD;  Location: [facility];  Service:  Cardiovascular;  Laterality: N/A;  . TEE WITHOUT CARDIOVERSION N/A 08/09/2012   Procedure: TRANSESOPHAGEAL ECHOCARDIOGRAM (TEE);  Surgeon: Sueanne Margarita, MD;  Location: [facility] ENDOSCOPY;  Service: Cardiovascular;  Laterality: N/A;  talk to angie in [place].  . TONSILLECTOMY       Allergies  Allergies  Allergen Reactions  . Codeine Nausea And Vomiting  . Lortab [Hydrocodone-Acetaminophen] Nausea And Vomiting    HPI  83 y.o. 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 She underwent surgery on 09/30/16>>Prosthetic replacement for femoral neck fracture, per ortho. She had post-operative anemia with a Hgb of 10. She was discharged by IM on 10/01/16. Eliquis was resumed. She was discharged to a SNF for rehab.   She presented to the ED around 1PM by EMS from SNF. Pt had complained of CP at SNF. She was noted to be in afib w/ RVR in the 130s on EMS arrival. She was given 30 mg of PO Cardizem and 500 cc NS enroute. EKG in ED confirmed atrial fibrillation 124 bpm. BP was borderline hypotensive. She was started on IV amiodarone. K WNL at 5.0. Hgb 9.9. Mg 2.5. POC troponin negative. CXR shows left basilar airspace disease which could be due to atelectasis or infection. WBC elevated at 12.4. She is afebrile. UA negative. Of note: no EKGs are on file from her most recent admission 6/30-7/3. Her last EKG  on file was from 03/11/16, performed in our office. This showed sinus bradycardia.   In ED currently, she appears to be in sinus tach with rate of 111 bpm.BP soft but stable. She is now asymptomatic.  Home Medications  Prior to Admission medications   Medication Sig Start Date End Date Taking? Authorizing Provider  amiodarone (PACERONE) 100 MG tablet Take 100 mg by mouth daily.    [provider]  ELIQUIS  2.5 MG TABS tablet TAKE 1 TABLET (2.5 MG TOTAL) BY MOUTH 2 (TWO) TIMES DAILY. 04/21/16   Sueanne Margarita, MD  furosemide (LASIX) 20 MG tablet Take 20 mg by mouth daily as needed for edema.     [provider]  iron polysaccharides (NIFEREX) 150 MG capsule Take 1 capsule (150 mg total) by mouth daily. 09/30/16   Barton Dubois, MD  lisinopril (PRINIVIL,ZESTRIL) 40 MG tablet TAKE 1 TABLET (40 MG TOTAL) BY MOUTH DAILY. Patient taking differently: Take 20 mg by mouth daily. TAKE 1 TABLET (40 MG TOTAL) BY MOUTH DAILY. 04/22/16   Sueanne Margarita, MD  oxyCODONE (OXY IR/ROXICODONE) 5 MG immediate release tablet Take 1-3 tablets (5-15 mg total) by mouth every 4 (four) hours as needed. 09/28/16   Leandrew Koyanagi, MD  potassium chloride SA (KLOR-CON M20) 20 MEQ tablet Take 2 tablets (40 mEq total) by mouth every other day. Patient taking differently: Take 20 mEq by mouth 2 (two) times daily.  05/23/16   Sueanne Margarita, MD  risperiDONE (RISPERDAL) 0.25 MG tablet Take 1 tablet (0.25 mg total) by mouth at bedtime. 09/30/16   Barton Dubois, MD  rosuvastatin (CRESTOR) 5 MG tablet Take 5 mg by mouth at bedtime.     [provider]  sitaGLIPtin (JANUVIA) 100 MG tablet Take 100 mg by mouth daily.    [provider]    Family History  Family History  Problem Relation Age of Onset  . Heart disease Mother   . CAD Mother   . Heart disease Father   . CAD Father   . Pancreatic cancer Brother     Social History  Social History   Social History  . Marital status: Single    Spouse name: N/A  . Number of children: N/A  . Years of education: N/A   Occupational History  . Not on file.   Social History Main Topics  . Smoking status: Former Research scientist (life sciences)  . Smokeless tobacco: Never Used  . Alcohol use 0.0 oz/week    1 - 2 Glasses of wine per week  . Drug use: No  . Sexual activity: Not on file   Other Topics Concern  . Not on file   Social History Narrative   Divorced, 3 children   Right  handed   Some college   2 cups daily      Review of Systems General:  No chills, fever, night sweats or weight changes.  Cardiovascular:  No chest pain, dyspnea on exertion, edema, orthopnea, palpitations, paroxysmal nocturnal dyspnea. Dermatological: No rash, lesions/masses Respiratory: No cough, dyspnea Urologic: No hematuria, dysuria Abdominal:   No nausea, vomiting, diarrhea, bright red blood per rectum, melena, or hematemesis Neurologic:  No visual changes, wkns, changes in mental status. All other systems reviewed and are otherwise negative except as noted above.  Physical Exam  Blood pressure 119/68, pulse (!) 114, temperature 98.3 F (36.8 C), temperature source Oral, resp. rate 19, height 5\' 5"  (1.651 m), weight 132 lb (59.9 kg), SpO2 95 %.  General:  Pleasant, NAD,  Psych: Normal affect. Neuro: Alert and oriented X 3. Moves all extremities spontaneously. HEENT: Normal  Neck: Supple without bruits or JVD. Lungs:  Resp regular and unlabored, decreased BS at the LL base, with crackles that clear post cough, c/w atelectasis  Heart: Reg rhythm, tachy rate no s3, s4, or murmurs. Abdomen: Soft, non-tender, non-distended, BS + x 4.  Extremities: No clubbing, cyanosis or edema. DP/PT/Radials 2+ and equal bilaterally.  Labs  Troponin [facility] of Care Test)  Recent Labs  10/14/16 1357  TROPIPOC 0.01   No results for input(s): CKTOTAL, CKMB, TROPONINI in the last 72 hours. Lab Results  Component Value Date   WBC 12.4 (H) 10/14/2016   HGB 9.9 (L) 10/14/2016   HCT 29.0 (L) 10/14/2016   MCV 101.9 (H) 10/14/2016   PLT 418 (H) 10/14/2016     Recent Labs Lab 10/14/16 1345 10/14/16 1400  NA 132* 133*  K 5.1 5.0  CL 98* 96*  CO2 27  --   BUN 15 16  CREATININE 1.11* 1.00  CALCIUM 8.1*  --   PROT 5.2*  --   BILITOT 0.5  --   ALKPHOS 85  --   ALT 11*  --   AST 14*  --   GLUCOSE 211* 210*   No results found for: CHOL, HDL, LDLCALC, TRIG No results found for: DDIMER    Radiology/Studies  Dg Pelvis 1-2 Views  Result Date: 09/27/2016 CLINICAL DATA:  Right hip and pelvic pain.  Recent pelvic fracture. EXAM: PELVIS - 1-2 VIEW COMPARISON:  07/21/2016 FINDINGS: Acute subcapital fracture of the right femoral neck is seen which shows mild displacement. No evidence of dislocation. Healing subacute fractures of the right superior and inferior pubic rami are seen, which show increased healing response since prior study. Generalized osteopenia noted. IMPRESSION: Mildly displaced subcapital fracture of the right femoral neck. Healing subacute fractures of right superior and inferior pubic rami. Electronically Signed   By: Earle Gell M.D.   On: 09/27/2016 12:15   Pelvis Portable  Result Date: 09/28/2016 CLINICAL DATA:  Right hip replacement surgery. EXAM: PORTABLE PELVIS 1-2 VIEWS COMPARISON:  CT scan 09/28/2014 FINDINGS: There is a bipolar type hip prosthesis on the right side which appears to be in good position without complicating features. No periprosthetic fracture. Stable healing right-sided superior and inferior pubic rami fractures. IMPRESSION: Well seated bipolar type right hip prosthesis without complicating features. Electronically Signed   By: Marijo Sanes M.D.   On: 09/28/2016 15:15   Ct Hip Right Wo Contrast  Result Date: 09/27/2016 CLINICAL DATA:  Evaluate subcapital fracture. EXAM: CT OF THE RIGHT HIP WITHOUT CONTRAST TECHNIQUE: Multidetector CT imaging of the right hip was performed according to the standard protocol. Multiplanar CT image reconstructions were also generated. COMPARISON:  Radiographs 09/27/2016 hand CT scan 07/11/2016 FINDINGS: There is a wound mildly impacted and slightly displaced subcapital fracture of the right hip. There is mild internal rotation of the femoral head and slight lateral displacement of the femoral neck, giving a slight varus deformity. Moderate right hip joint degenerative changes with joint space narrowing and spurring. The  acetabulum is intact. No fracture. There are partially united superior and inferior pubic rami fractures on the right side. Exuberant callus formation. The visualized right SI joint appears normal. The surrounding hip and pelvic musculature are grossly normal. No significant intrapelvic abnormalities on the right side. IMPRESSION: 1. Mildly impacted and slightly displaced subcapital fracture of the right hip as described above. 2. Partially  united superior and inferior pubic rami fractures on the right. Electronically Signed   By: Marijo Sanes M.D.   On: 09/27/2016 15:06   Dg Chest Portable 1 View  Result Date: 10/14/2016 CLINICAL DATA:  Tachycardia today. EXAM: PORTABLE CHEST 1 VIEW COMPARISON:  PA and lateral chest 12/28/2012 and single view of the chest 08/02/2012. FINDINGS: There is left basilar airspace disease. The right lung is clear. The chest is hyperexpanded. No pneumothorax. Heart size is normal. Atherosclerosis noted. IMPRESSION: Left basilar airspace disease which could be due to atelectasis or infection. Recommend follow-up to clearing. Emphysema. Electronically Signed   By: Inge Rise M.D.   On: 10/14/2016 13:57   Dg C-arm 1-60 Min  Result Date: 09/28/2016 CLINICAL DATA:  Right hip hemiarthroplasty. EXAM: OPERATIVE right HIP (WITH PELVIS IF PERFORMED) 2 VIEWS TECHNIQUE: Fluoroscopic spot image(s) were submitted for interpretation post-operatively. COMPARISON:  09/27/2016. FINDINGS: Bipolar hip arthroplasty on the right. Components are well-positioned. No radiographically detectable complication. Healing rami fractures again demonstrated. IMPRESSION: Good appearance following bipolar hip arthroplasty on the right. Electronically Signed   By: Nelson Chimes M.D.   On: 09/28/2016 14:28   Dg Hip Operative Unilat W Or W/o Pelvis Right  Result Date: 09/28/2016 CLINICAL DATA:  Right hip hemiarthroplasty. EXAM: OPERATIVE right HIP (WITH PELVIS IF PERFORMED) 2 VIEWS TECHNIQUE: Fluoroscopic spot  image(s) were submitted for interpretation post-operatively. COMPARISON:  09/27/2016. FINDINGS: Bipolar hip arthroplasty on the right. Components are well-positioned. No radiographically detectable complication. Healing rami fractures again demonstrated. IMPRESSION: Good appearance following bipolar hip arthroplasty on the right. Electronically Signed   By: Nelson Chimes M.D.   On: 09/28/2016 14:28   Dg Femur Min 2 Views Left  Result Date: 09/27/2016 CLINICAL DATA:  Fall.  Left hip and pelvic pain.  Initial encounter. EXAM: LEFT FEMUR 2 VIEWS COMPARISON:  None. FINDINGS: There is no evidence of fracture or other focal bone lesions. Mild peripheral vascular calcification noted. IMPRESSION: No acute findings. Electronically Signed   By: Earle Gell M.D.   On: 09/27/2016 12:16   Dg Femur Min 2 Views Right  Result Date: 09/27/2016 CLINICAL DATA:  Fall.  Right hip pain.  Initial encounter. EXAM: RIGHT FEMUR 2 VIEWS COMPARISON:  None. FINDINGS: Mildly displaced subcapital fracture right femoral neck is seen. No other femur fracture identified. No evidence of hip dislocation. Mild peripheral vascular calcification noted. IMPRESSION: Mildly displaced subcapital right femoral neck fracture. Electronically Signed   By: Earle Gell M.D.   On: 09/27/2016 12:18    ECG  Atrial fibrillation 124 bpm  -- personally reviewed  Telemetry  Currently sinus tach 111-- personally reviewed    ASSESSMENT AND PLAN  1. Atrial Fibrillation w/ RVR: Pt on Eliquis for a/c. She was given IV bolus of amiodarone in ED given RVR in the 130s and borderline hypotension. Now sinus tach with rate of 111 bpm. Symptoms resolved. No further CP. Troponin negative. K stable. UA negative. CXR with Left basilar airspace disease, which may represent PNA. She has an elevated WBC and currently sinus tach. She is afebrile. I've discussed plan with ED MD. Plan is to call IM for admission. The patient has been on PO amiodarone as an outpatient,  low dose 100 mg daily. QTc 528 ms on post conversion EKG, thus will avoid increasing dose. BP limits use of addition of BB/CCB. Home meds reviewed. She is on lisinopril 40 mg. Can reduce dose/ d/c to allow for room to add BB vs CCB. MD to assess and  will provide further recommendations. Continue Eliquis for a/c.  Signed, Minus Breeding, PA-C, M[facility] 10/14/2016, 4:27 PM [facility] Pager: [phone]  History and all data above reviewed.  Patient examined.  I agree with the findings as above.  She has confusion and can give me no history.  she does not recall any symptoms.  She apparently had some chest pain this morning and she was noted to have an elevated heart rate.  She presented to the ED in atrial fib. She was given IV amio bolus and converted to a regular tachycardia which appears to be sinus.  She does not notice any palpitations.  She does not have chest pain or SOB currently.  She is noted to have an elevated WBC, question infiltrate on CXR and "hazy" urine.   The patient exam reveals COR:RRR  ,  Lungs: Clear  ,  Abd:  Positive bowel sounds, no rebound no guarding, Ext Mild edema right greater than left  .  All available labs, radiology testing, previous records reviewed. Agree with documented assessment and plan. Atrial fib:  She has a history of PAF.  Now appears to be in sinus tach.  She has been on DOAC and tolerating this.  At this point I don't see any change in therapy is indicated.  There might be an acute precipitating event with her increased WBC.  We will follow for further med adjustments and rate control.   Jeneen Rinks Roberto Romanoski  5:18 PM  10/14/2016

## 2016-10-14 NOTE — Progress Notes (Signed)
Patient arrived to [facility] bed 17 from emergency room.Patient confused oriented to name only.Patient keeps repeating ," Where am I ? I need my clothes.I can't stay here." Informed patient that she is  at the hospital and family has her belonging.Patient does have makeup bag with glasses and cell phone with her now.Assisted patient to bed by nursing staff.Patient has staples to right groin /hip area and skin tear to left hand and left wrist foam dressing applied.Patient placed on telemetry as ordered.Patient stated," I don't believe I need that." Educated patient need for telemetry to monitor heart rate and rhythm.Educated patient at risk for falls and not to get out of bed alone to call for help.Patient stated," I don't need help. I can get up when I need to." Yellow bracelet ,yellow socks and bed alarm set for safety.High low bed ordered none available tonight.Patient denies pain at present time.No acute distress noted.Will defer history and physical to next shift or when family available .

## 2016-10-14 NOTE — H&P (Signed)
[facility] History and Physical  Cynthia Wright URK:[id number] DOB: 1924/12/31 DOA: 10/14/2016  PCP: Aretta Nip, MD  Patient coming from: [facility] SNF  Chief Complaint: tachycardia  HPI: Cynthia Wright is a 83 y.o. female with a medical history of paroxysmal atrial fibrillation status post DC CVA in 8315, diastolic heart failure, diabetes, hypertension, recent hip fracture with hospitalization, who presented to the emergency department with complaints of rapid heart rate. Supposedly patient had some chest pain however she is a poor historian secondary to her dementia. Currently patient denies any chest pain, shortness of breath, cough. She is currently at [facility] rehabilitation. EMS was called as patient reported chest pain and dyspnea at this facility. She was given 30 mg of Cardizem by mouth for tachycardia followed by nitroglycerin for her chest pain. Her symptoms continued and therefore was brought to the hospital. Per family she has not been eating or drinking well. She does have history of hemorrhoidal bleeding, and was found to have some blood on her pad this morning. Family denied any recent complaints. Per son, patient does have history of short-term memory loss.  ED Course: Found to have atrial fibrillation, rate in the 130s. Patient was given IV amiodarone, converted to sinus tachycardia. Chest x-ray showed questionable pneumonia. Patient placed on vancomycin and that the pain. Cardiology consulted. TRH called for admission.  Review of Systems:  Difficult to obtain given patient's dementia and short-term memory loss.  Past Medical History:  Diagnosis Date  . Atrial fibrillation ([place])    s/p DCCV 07/2012  . Benign diastolic hypertension   . Chronic anticoagulation   . Chronic diastolic CHF (congestive heart failure) (HCC)    EF now 65% by echo 10/2012 with grade II diasotlid dysfunction  . Diabetes mellitus without complication ([place])   . Dilated  cardiomyopathy secondary to tachycardia ([place]) 07/2012   2D echo 10/2012 showed EF 65% with grade II diastolic dysfunction  . Elevated cholesterol   . Hyperlipidemia   . Hypertension   . Liver cyst   . Memory loss     Past Surgical History:  Procedure Laterality Date  . ANTERIOR APPROACH HEMI HIP ARTHROPLASTY Right 09/28/2016   Procedure: ANTERIOR APPROACH HEMI HIP ARTHROPLASTY;  Surgeon: Leandrew Koyanagi, MD;  Location: [place];  Service: Orthopedics;  Laterality: Right;  . APPENDECTOMY    . BACK SURGERY     herniated disc  . CARDIOVERSION N/A 08/09/2012   Procedure: CARDIOVERSION;  Surgeon: Sueanne Margarita, MD;  Location: [place];  Service: Cardiovascular;  Laterality: N/A;  . CARDIOVERSION N/A 09/28/2014   Procedure: CARDIOVERSION;  Surgeon: Sueanne Margarita, MD;  Location: [facility];  Service: Cardiovascular;  Laterality: N/A;  . TEE WITHOUT CARDIOVERSION N/A 08/09/2012   Procedure: TRANSESOPHAGEAL ECHOCARDIOGRAM (TEE);  Surgeon: Sueanne Margarita, MD;  Location: [facility] ENDOSCOPY;  Service: Cardiovascular;  Laterality: N/A;  talk to angie in [place].  . TONSILLECTOMY      Social History:  reports that she has quit smoking. She has never used smokeless tobacco. She reports that she drinks alcohol. She reports that she does not use drugs.  Allergies  Allergen Reactions  . Codeine Nausea And Vomiting  . Lortab [Hydrocodone-Acetaminophen] Nausea And Vomiting    Family History  Problem Relation Age of Onset  . Heart disease Mother   . CAD Mother   . Heart disease Father   . CAD Father   . Pancreatic cancer Brother      Prior to Admission medications  Medication Sig Start Date End Date Taking? Authorizing Provider  amiodarone (PACERONE) 100 MG tablet Take 100 mg by mouth daily.    [provider]  ELIQUIS 2.5 MG TABS tablet TAKE 1 TABLET (2.5 MG TOTAL) BY MOUTH 2 (TWO) TIMES DAILY. 04/21/16   Sueanne Margarita, MD  furosemide (LASIX) 20 MG tablet Take 20 mg by mouth daily as needed  for edema.     [provider]  iron polysaccharides (NIFEREX) 150 MG capsule Take 1 capsule (150 mg total) by mouth daily. 09/30/16   Barton Dubois, MD  lisinopril (PRINIVIL,ZESTRIL) 40 MG tablet TAKE 1 TABLET (40 MG TOTAL) BY MOUTH DAILY. Patient taking differently: Take 20 mg by mouth daily. TAKE 1 TABLET (40 MG TOTAL) BY MOUTH DAILY. 04/22/16   Sueanne Margarita, MD  oxyCODONE (OXY IR/ROXICODONE) 5 MG immediate release tablet Take 1-3 tablets (5-15 mg total) by mouth every 4 (four) hours as needed. 09/28/16   Leandrew Koyanagi, MD  potassium chloride SA (KLOR-CON M20) 20 MEQ tablet Take 2 tablets (40 mEq total) by mouth every other day. Patient taking differently: Take 20 mEq by mouth 2 (two) times daily.  05/23/16   Sueanne Margarita, MD  risperiDONE (RISPERDAL) 0.25 MG tablet Take 1 tablet (0.25 mg total) by mouth at bedtime. 09/30/16   Barton Dubois, MD  rosuvastatin (CRESTOR) 5 MG tablet Take 5 mg by mouth at bedtime.     [provider]  sitaGLIPtin (JANUVIA) 100 MG tablet Take 100 mg by mouth daily.    [provider]    Physical Exam: Vitals:   10/14/16 1545 10/14/16 1600  BP: 121/71 119/68  Pulse: (!) 113 (!) 114  Resp: 20 19  Temp:       General: Well developed, well nourished, NAD, appears stated age  HEENT: NCAT, PERRLA, EOMI, Anicteic Sclera, mucous membranes moist.   Neck: Supple, no JVD, no masses  Cardiovascular: S1 S2 auscultated, no rubs, murmurs or gallops. Tachycardic   Respiratory: Diminished breath sounds left lung base, occ cough  Abdomen: Soft, nontender, nondistended, + bowel sounds  Extremities: warm dry without cyanosis clubbing. +LE edema  Neuro: AAOx3 (self, place, situation), cranial nerves grossly intact. Strength 5/5 in patient's upper and lower extremities bilaterally. Patient does have dementia.  Skin: Without rashes exudates or nodules  Psych: Appropriate mood and affect  Labs on Admission: I have personally reviewed  following labs and imaging studies CBC:  Recent Labs Lab 10/14/16 1345 10/14/16 1400  WBC 12.4*  --   NEUTROABS 10.8*  --   HGB 10.3* 9.9*  HCT 32.7* 29.0*  MCV 101.9*  --   PLT 418*  --    Basic Metabolic Panel:  Recent Labs Lab 10/14/16 1345 10/14/16 1400  NA 132* 133*  K 5.1 5.0  CL 98* 96*  CO2 27  --   GLUCOSE 211* 210*  BUN 15 16  CREATININE 1.11* 1.00  CALCIUM 8.1*  --   MG 2.5*  --    GFR: Estimated Creatinine Clearance: 34.3 mL/min (by C-G formula based on SCr of 1 mg/dL). Liver Function Tests:  Recent Labs Lab 10/14/16 1345  AST 14*  ALT 11*  ALKPHOS 85  BILITOT 0.5  PROT 5.2*  ALBUMIN 2.5*   No results for input(s): LIPASE, AMYLASE in the last 168 hours. No results for input(s): AMMONIA in the last 168 hours. Coagulation Profile: No results for input(s): INR, PROTIME in the last 168 hours. Cardiac Enzymes: No results for input(s):  CKTOTAL, CKMB, CKMBINDEX, TROPONINI in the last 168 hours. BNP (last 3 results) No results for input(s): PROBNP in the last 8760 hours. HbA1C: No results for input(s): HGBA1C in the last 72 hours. CBG: No results for input(s): GLUCAP in the last 168 hours. Lipid Profile: No results for input(s): CHOL, HDL, LDLCALC, TRIG, CHOLHDL, LDLDIRECT in the last 72 hours. Thyroid Function Tests: No results for input(s): TSH, T4TOTAL, FREET4, T3FREE, THYROIDAB in the last 72 hours. Anemia Panel: No results for input(s): VITAMINB12, FOLATE, FERRITIN, TIBC, IRON, RETICCTPCT in the last 72 hours. Urine analysis:    Component Value Date/Time   COLORURINE YELLOW 10/14/2016 1510   APPEARANCEUR HAZY (A) 10/14/2016 1510   LABSPEC 1.013 10/14/2016 1510   PHURINE 5.0 10/14/2016 1510   GLUCOSEU NEGATIVE 10/14/2016 1510   HGBUR NEGATIVE 10/14/2016 1510   BILIRUBINUR NEGATIVE 10/14/2016 1510   KETONESUR NEGATIVE 10/14/2016 1510   PROTEINUR NEGATIVE 10/14/2016 1510   NITRITE NEGATIVE 10/14/2016 1510   LEUKOCYTESUR NEGATIVE  10/14/2016 1510   Sepsis Labs: @LABRCNTIP (procalcitonin:4,lacticidven:4) )No results found for this or any previous visit (from the past 240 hour(s)).   Radiological Exams on Admission: Dg Chest Portable 1 View  Result Date: 10/14/2016 CLINICAL DATA:  Tachycardia today. EXAM: PORTABLE CHEST 1 VIEW COMPARISON:  PA and lateral chest 12/28/2012 and single view of the chest 08/02/2012. FINDINGS: There is left basilar airspace disease. The right lung is clear. The chest is hyperexpanded. No pneumothorax. Heart size is normal. Atherosclerosis noted. IMPRESSION: Left basilar airspace disease which could be due to atelectasis or infection. Recommend follow-up to clearing. Emphysema. Electronically Signed   By: Inge Rise M.D.   On: 10/14/2016 13:57    EKG: Independently reviewed. Initial EKG showed atrial fibrillation, rate 124. Latest EKG: Sinus tachycardia, rate 111, QTC 528  Assessment/Plan Atrial fibrillation with RVR -Suspect secondary to possible pneumonia -Patient has history of paroxysmal atrial fibrillation -CHADVASC 6 (age, gender, CHF, DM, HTN) -Patient presented with heart rate in the 130s, blood pressure, with SBP in the 90s -Patient was given 1 dose of IV amiodarone, then converted to sinus tachycardia -Currently patient remained in sinus tachycardia, rate 114 -Cardiology consulted and appreciated -Will continue home dose of amiodarone, Eliquis for anticoagulation -Will order TSH -Cannot use cardizem or beta-blockers given soft blood pressure  Sepsis secondary to HCAP -Upon admission, patient was tachycardic, tachypneic, with leukocytosis -Lactic acid currently pending -Blood cultures currently pending -Chest x-ray left basilar airspace disease, atelectasis versus pneumonia -Will order sepsis, pneumonia order set -Placed on vancomycin and cefepime -Strep urine pneumonia antigen ordered  Essential hypertension -Will hold lisinopril and Lasix given that patient's blood  pressure was very soft upon admission -Currently BP 119/68 -Continue to monitor closely, restart medications possibly at half dose when BP remains stable  Diabetes mellitus, type II -Hold Januvia -Placed on insulin signs show CBG monitoring  Chronic diastolic CHF -Echocardiogram 06/10/2013 showed an EF of 47-42%, grade 2 diastolic dysfunction -Patient does have lower extremity swelling however does not appear to be overtly volume overloaded -As above, will hold Lasix at this time given that patient did have soft BP  Recent right femoral neck fracture -Patient was recently discharged on 09/30/2016 to nursing facility for rehabilitation -Continue pain control as needed  Hyperlipidemia -Continue statin  Dementia -Per son, at bedside patient does have memory loss and dementia -Continue Risperdal and supportive care  Postoperative anemia -Patient recently discharged with postoperative anemia, right hip fracture -Hemoglobin currently 9.9 -Upon discharge on 09/30/2016 hemoglobin was 10.5 -  Continue iron supplementation -Monitor CBC  DVT prophylaxis: Eliquis  Code Status: Full  Family Communication: Family at bedside. Admission, patients condition and plan of care including tests being ordered have been discussed with the patient and family who indicate understanding and agree with the plan and Code Status.  Disposition Plan: Back to SNF    Consults called: Cardiology   Admission status: Observation   Time spent: 65 minutes  Helana Macbride D.O. [facility] Pager [phone]  If 7PM-7AM, please contact night-coverage [email or web address] Password [facility] 10/14/2016, 4:44 PM

## 2016-10-14 NOTE — Progress Notes (Signed)
Called and received report from [employer].

## 2016-10-14 NOTE — ED Triage Notes (Signed)
Pt brought in by EMS due to having chest pain this morning from [place] facility. Pt in a.fib, has hx of a.fib. Pt HR 80-130's per EMS. Pt given 30mg  of cardizem PO. Pt given 500cc of NS enroute. Per EMS, nursing facility found blood in pts pad this morning. Pt takes eliquis.

## 2016-10-14 NOTE — ED Notes (Signed)
Attempted report 

## 2016-10-15 ENCOUNTER — Encounter (HOSPITAL_COMMUNITY): Payer: Self-pay | Admitting: General Practice

## 2016-10-15 DIAGNOSIS — Z9181 History of falling: Secondary | ICD-10-CM | POA: Diagnosis not present

## 2016-10-15 DIAGNOSIS — R4182 Altered mental status, unspecified: Secondary | ICD-10-CM | POA: Diagnosis not present

## 2016-10-15 DIAGNOSIS — S72001D Fracture of unspecified part of neck of right femur, subsequent encounter for closed fracture with routine healing: Secondary | ICD-10-CM | POA: Diagnosis not present

## 2016-10-15 DIAGNOSIS — K5900 Constipation, unspecified: Secondary | ICD-10-CM | POA: Diagnosis not present

## 2016-10-15 DIAGNOSIS — Z885 Allergy status to narcotic agent status: Secondary | ICD-10-CM | POA: Diagnosis not present

## 2016-10-15 DIAGNOSIS — E1169 Type 2 diabetes mellitus with other specified complication: Secondary | ICD-10-CM | POA: Diagnosis not present

## 2016-10-15 DIAGNOSIS — Z8673 Personal history of transient ischemic attack (TIA), and cerebral infarction without residual deficits: Secondary | ICD-10-CM | POA: Diagnosis not present

## 2016-10-15 DIAGNOSIS — D509 Iron deficiency anemia, unspecified: Secondary | ICD-10-CM | POA: Diagnosis present

## 2016-10-15 DIAGNOSIS — R41841 Cognitive communication deficit: Secondary | ICD-10-CM | POA: Diagnosis not present

## 2016-10-15 DIAGNOSIS — I5032 Chronic diastolic (congestive) heart failure: Secondary | ICD-10-CM | POA: Diagnosis not present

## 2016-10-15 DIAGNOSIS — I480 Paroxysmal atrial fibrillation: Secondary | ICD-10-CM | POA: Diagnosis not present

## 2016-10-15 DIAGNOSIS — Z7984 Long term (current) use of oral hypoglycemic drugs: Secondary | ICD-10-CM | POA: Diagnosis not present

## 2016-10-15 DIAGNOSIS — R278 Other lack of coordination: Secondary | ICD-10-CM | POA: Diagnosis not present

## 2016-10-15 DIAGNOSIS — Z96641 Presence of right artificial hip joint: Secondary | ICD-10-CM | POA: Diagnosis not present

## 2016-10-15 DIAGNOSIS — Z7901 Long term (current) use of anticoagulants: Secondary | ICD-10-CM | POA: Diagnosis not present

## 2016-10-15 DIAGNOSIS — I959 Hypotension, unspecified: Secondary | ICD-10-CM | POA: Diagnosis present

## 2016-10-15 DIAGNOSIS — J189 Pneumonia, unspecified organism: Secondary | ICD-10-CM | POA: Diagnosis not present

## 2016-10-15 DIAGNOSIS — I481 Persistent atrial fibrillation: Secondary | ICD-10-CM | POA: Diagnosis not present

## 2016-10-15 DIAGNOSIS — F0391 Unspecified dementia with behavioral disturbance: Secondary | ICD-10-CM | POA: Diagnosis not present

## 2016-10-15 DIAGNOSIS — E785 Hyperlipidemia, unspecified: Secondary | ICD-10-CM | POA: Diagnosis not present

## 2016-10-15 DIAGNOSIS — F0281 Dementia in other diseases classified elsewhere with behavioral disturbance: Secondary | ICD-10-CM | POA: Diagnosis not present

## 2016-10-15 DIAGNOSIS — I10 Essential (primary) hypertension: Secondary | ICD-10-CM | POA: Diagnosis not present

## 2016-10-15 DIAGNOSIS — Z87891 Personal history of nicotine dependence: Secondary | ICD-10-CM | POA: Diagnosis not present

## 2016-10-15 DIAGNOSIS — R52 Pain, unspecified: Secondary | ICD-10-CM | POA: Diagnosis not present

## 2016-10-15 DIAGNOSIS — I4891 Unspecified atrial fibrillation: Secondary | ICD-10-CM | POA: Diagnosis not present

## 2016-10-15 DIAGNOSIS — M6281 Muscle weakness (generalized): Secondary | ICD-10-CM | POA: Diagnosis not present

## 2016-10-15 DIAGNOSIS — D508 Other iron deficiency anemias: Secondary | ICD-10-CM | POA: Diagnosis not present

## 2016-10-15 DIAGNOSIS — E119 Type 2 diabetes mellitus without complications: Secondary | ICD-10-CM | POA: Diagnosis not present

## 2016-10-15 DIAGNOSIS — J9811 Atelectasis: Secondary | ICD-10-CM | POA: Diagnosis not present

## 2016-10-15 DIAGNOSIS — S728X9A Other fracture of unspecified femur, initial encounter for closed fracture: Secondary | ICD-10-CM | POA: Diagnosis not present

## 2016-10-15 DIAGNOSIS — Z800 Family history of malignant neoplasm of digestive organs: Secondary | ICD-10-CM | POA: Diagnosis not present

## 2016-10-15 DIAGNOSIS — R262 Difficulty in walking, not elsewhere classified: Secondary | ICD-10-CM | POA: Diagnosis not present

## 2016-10-15 DIAGNOSIS — D649 Anemia, unspecified: Secondary | ICD-10-CM | POA: Diagnosis not present

## 2016-10-15 DIAGNOSIS — I110 Hypertensive heart disease with heart failure: Secondary | ICD-10-CM | POA: Diagnosis not present

## 2016-10-15 DIAGNOSIS — I420 Dilated cardiomyopathy: Secondary | ICD-10-CM | POA: Diagnosis not present

## 2016-10-15 DIAGNOSIS — Z8249 Family history of ischemic heart disease and other diseases of the circulatory system: Secondary | ICD-10-CM | POA: Diagnosis not present

## 2016-10-15 DIAGNOSIS — K5641 Fecal impaction: Secondary | ICD-10-CM | POA: Diagnosis not present

## 2016-10-15 LAB — CBC
HCT: 31.8 % — ABNORMAL LOW (ref 36.0–46.0)
Hemoglobin: 10.1 g/dL — ABNORMAL LOW (ref 12.0–15.0)
MCH: 32.6 pg (ref 26.0–34.0)
MCHC: 31.8 g/dL (ref 30.0–36.0)
MCV: 102.6 fL — ABNORMAL HIGH (ref 78.0–100.0)
Platelets: 379 10*3/uL (ref 150–400)
RBC: 3.1 MIL/uL — ABNORMAL LOW (ref 3.87–5.11)
RDW: 14.5 % (ref 11.5–15.5)
WBC: 9.2 10*3/uL (ref 4.0–10.5)

## 2016-10-15 LAB — BASIC METABOLIC PANEL
Anion gap: 8 (ref 5–15)
BUN: 12 mg/dL (ref 6–20)
CALCIUM: 8.3 mg/dL — AB (ref 8.9–10.3)
CO2: 26 mmol/L (ref 22–32)
Chloride: 104 mmol/L (ref 101–111)
Creatinine, Ser: 0.97 mg/dL (ref 0.44–1.00)
GFR calc Af Amer: 58 mL/min — ABNORMAL LOW (ref >60)
GFR, EST NON AFRICAN AMERICAN: 50 mL/min — AB (ref >60)
GLUCOSE: 167 mg/dL — AB (ref 65–99)
Potassium: 4.6 mmol/L (ref 3.5–5.1)
Sodium: 138 mmol/L (ref 135–145)

## 2016-10-15 LAB — GLUCOSE, CAPILLARY
GLUCOSE-CAPILLARY: 167 mg/dL — AB (ref 65–99)
GLUCOSE-CAPILLARY: 150 mg/dL — AB (ref 65–99)
GLUCOSE-CAPILLARY: 170 mg/dL — AB (ref 65–99)
GLUCOSE-CAPILLARY: 152 mg/dL — AB (ref 65–99)

## 2016-10-15 LAB — STREP PNEUMONIAE URINARY ANTIGEN: Strep Pneumo Urinary Antigen: NEGATIVE

## 2016-10-15 MED ORDER — AMIODARONE IV BOLUS ONLY 150 MG/100ML
150.0000 mg | Freq: Once | INTRAVENOUS | Status: AC
  Administered 2016-10-15: 150 mg via INTRAVENOUS
  Filled 2016-10-15: qty 100

## 2016-10-15 MED ORDER — AMIODARONE IV BOLUS ONLY 150 MG/100ML: 150.0000 mg | Freq: Once | INTRAVENOUS | Status: DC

## 2016-10-15 MED ORDER — AMIODARONE LOAD VIA INFUSION
150.0000 mg | Freq: Once | INTRAVENOUS | Status: AC
  Administered 2016-10-15: 150 mg via INTRAVENOUS
  Filled 2016-10-15: qty 83.34

## 2016-10-15 MED ORDER — AMIODARONE HCL IN DEXTROSE 360-4.14 MG/200ML-% IV SOLN
30.0000 mg/h | INTRAVENOUS | Status: DC
  Administered 2016-10-15 – 2016-10-16 (×2): 30 mg/h via INTRAVENOUS
  Filled 2016-10-15: qty 200

## 2016-10-15 MED ORDER — AMIODARONE LOAD VIA INFUSION
150.0000 mg | Freq: Once | INTRAVENOUS | Status: DC
  Administered 2016-10-15: 150 mg via INTRAVENOUS

## 2016-10-15 MED ORDER — AMIODARONE HCL IN DEXTROSE 360-4.14 MG/200ML-% IV SOLN
60.0000 mg/h | INTRAVENOUS | Status: AC
  Administered 2016-10-15 (×2): 60 mg/h via INTRAVENOUS
  Filled 2016-10-15 (×3): qty 200

## 2016-10-15 NOTE — Progress Notes (Signed)
12 Lead EKG ordered and obtained

## 2016-10-15 NOTE — Progress Notes (Signed)
Called patient son Shanon Brow to inform him mother refusal for telemetry monitor and swatting at staff.Informed him of order received for one time dose of ativan and need for possible restraints.Patient son Shanon Brow okay with giving ativan and restraints if needed.

## 2016-10-15 NOTE — Progress Notes (Signed)
Heart rate sustaining in the 120s-130s and Cardiology APP sent text message. 2nd Amiodarone bolus of 150mg  ordered and given.

## 2016-10-15 NOTE — Progress Notes (Signed)
PROGRESS NOTE    Cynthia Wright  MBT:[id number] DOB: [birth year] DOA: 10/14/2016 PCP: Aretta Nip, MD   Outpatient Specialists:     Brief Narrative:  Cynthia Wright is a 83 y.o. female with a medical history of paroxysmal atrial fibrillation status post DC CVA in 5364, diastolic heart failure, diabetes, hypertension, recent hip fracture with hospitalization, who presented to the emergency department with complaints of rapid heart rate. Supposedly patient had some chest pain however she is a poor historian secondary to her dementia. Currently patient denies any chest pain, shortness of breath, cough. She is currently at [facility] rehabilitation. EMS was called as patient reported chest pain and dyspnea at this facility. She was given 30 mg of Cardizem by mouth for tachycardia followed by nitroglycerin for her chest pain. Her symptoms continued and therefore was brought to the hospital. Per family she has not been eating or drinking well. She does have history of hemorrhoidal bleeding, and was found to have some blood on her pad this morning. Family denied any recent complaints. Per son, patient does have history of short-term memory loss.   Assessment & Plan:   Active Problems:   Persistent atrial fibrillation (HCC)   Chronic diastolic CHF (congestive heart failure) (HCC)   Type 2 diabetes mellitus with hyperlipidemia (HCC)   Postoperative anemia   Hyperlipidemia   A-fib (HCC)   Atrial fibrillation with RVR -Suspect secondary to possible pneumonia -Patient has history of paroxysmal atrial fibrillation -CHADVASC 6 (age, gender, CHF, DM, HTN) -Patient was given 1 dose of IV amiodarone, then converted to sinus tachycardia -Cardiology consulted and appreciated -Cannot use cardizem or beta-blockers given soft blood pressure -plan to start on amiodarone infusion  Sepsis secondary to HCAP -Upon admission, patient was tachycardic, tachypneic, with leukocytosis -Blood  cultures currently NGTD after 24 hours -Chest x-ray left basilar airspace disease, atelectasis versus pneumonia -Placed on vancomycin and cefepime -Strep urine pneumonia antigen ordered  Essential hypertension -Will hold lisinopril and Lasix given that patient's blood pressure was very soft upon admission -Continue to monitor closely, restart medications possibly at half dose when BP remains stable  Diabetes mellitus, type II -Hold Januvia -SSI  Chronic diastolic CHF -Echocardiogram 06/10/2013 showed an EF of 68-03%, grade 2 diastolic dysfunction -Patient does have lower extremity swelling however does not appear to be overtly volume overloaded -As above, will hold Lasix at this time given that patient did have soft BP  Recent right femoral neck fracture -Patient was recently discharged on 09/30/2016 to nursing facility for rehabilitation -Continue pain control as needed  Hyperlipidemia -Continue statin  Dementia -Per son, at bedside patient does have memory loss and dementia -Continue Risperdal and supportive care  Postoperative anemia -Patient recently discharged with postoperative anemia, right hip fracture -Hemoglobin currently 9.9 -Upon discharge on 09/30/2016 hemoglobin was 10.5 -Continue iron supplementation -Monitor CBC   DVT prophylaxis:  Fully anticoagulated   Code Status: Full Code   Family Communication:   Disposition Plan:     Consultants:  cards  Subjective: No complaints  Objective: Vitals:   10/15/16 0039 10/15/16 0420 10/15/16 1132 10/15/16 1147  BP: 137/63 130/75 (!) 142/126 (!) 96/53  Pulse: (!) 115 (!) 113 (!) 138 (!) 122  Resp: 18 18 18    Temp: 98.4 F (36.9 C) 98.4 F (36.9 C) 98 F (36.7 C)   TempSrc: Oral Oral Oral   SpO2: 100% 100% 100%   Weight:  60.8 kg (134 lb 1.6 oz)    Height:  Intake/Output Summary (Last 24 hours) at 10/15/16 1322 Last data filed at 10/15/16 1312  Gross per 24 hour  Intake              1010 ml  Output              400 ml  Net              610 ml   Filed Weights   10/14/16 1322 10/14/16 2044 10/15/16 0420  Weight: 59.9 kg (132 lb) 58.9 kg (129 lb 12.8 oz) 60.8 kg (134 lb 1.6 oz)    Examination:  General exam: in bed, sitter at bedside Respiratory system: Clear to auscultation. Respiratory effort normal. Cardiovascular system: S1 & S2 heard, RRR. No JVD, murmurs, rubs, gallops or clicks. No pedal edema. Gastrointestinal system: Abdomen is nondistended, soft and nontender. No organomegaly or masses felt. Normal bowel sounds heard. Central nervous system: Alert and oriented. No focal neurological deficits. Extremities: Symmetric 5 x 5 power. Skin: No rashes, lesions or ulcers Psychiatry: Judgement and insight appear normal. Mood & affect appropriate.     Data Reviewed: I have personally reviewed following labs and imaging studies  CBC:  Recent Labs Lab 10/14/16 1345 10/14/16 1400 10/15/16 0425  WBC 12.4*  --  9.2  NEUTROABS 10.8*  --   --   HGB 10.3* 9.9* 10.1*  HCT 32.7* 29.0* 31.8*  MCV 101.9*  --  102.6*  PLT 418*  --  676   Basic Metabolic Panel:  Recent Labs Lab 10/14/16 1345 10/14/16 1400 10/15/16 0425  NA 132* 133* 138  K 5.1 5.0 4.6  CL 98* 96* 104  CO2 27  --  26  GLUCOSE 211* 210* 167*  BUN 15 16 12   CREATININE 1.11* 1.00 0.97  CALCIUM 8.1*  --  8.3*  MG 2.5*  --   --    GFR: Estimated Creatinine Clearance: 35.4 mL/min (by C-G formula based on SCr of 0.97 mg/dL). Liver Function Tests:  Recent Labs Lab 10/14/16 1345  AST 14*  ALT 11*  ALKPHOS 85  BILITOT 0.5  PROT 5.2*  ALBUMIN 2.5*   No results for input(s): LIPASE, AMYLASE in the last 168 hours. No results for input(s): AMMONIA in the last 168 hours. Coagulation Profile: No results for input(s): INR, PROTIME in the last 168 hours. Cardiac Enzymes: No results for input(s): CKTOTAL, CKMB, CKMBINDEX, TROPONINI in the last 168 hours. BNP (last 3 results) No results  for input(s): PROBNP in the last 8760 hours. HbA1C: No results for input(s): HGBA1C in the last 72 hours. CBG:  Recent Labs Lab 10/14/16 2053 10/15/16 0745 10/15/16 1140  GLUCAP 177* 167* 150*   Lipid Profile: No results for input(s): CHOL, HDL, LDLCALC, TRIG, CHOLHDL, LDLDIRECT in the last 72 hours. Thyroid Function Tests: No results for input(s): TSH, T4TOTAL, FREET4, T3FREE, THYROIDAB in the last 72 hours. Anemia Panel: No results for input(s): VITAMINB12, FOLATE, FERRITIN, TIBC, IRON, RETICCTPCT in the last 72 hours. Urine analysis:    Component Value Date/Time   COLORURINE YELLOW 10/14/2016 1510   APPEARANCEUR HAZY (A) 10/14/2016 1510   LABSPEC 1.013 10/14/2016 1510   PHURINE 5.0 10/14/2016 1510   GLUCOSEU NEGATIVE 10/14/2016 1510   HGBUR NEGATIVE 10/14/2016 1510   BILIRUBINUR NEGATIVE 10/14/2016 1510   KETONESUR NEGATIVE 10/14/2016 1510   PROTEINUR NEGATIVE 10/14/2016 1510   NITRITE NEGATIVE 10/14/2016 1510   LEUKOCYTESUR NEGATIVE 10/14/2016 1510     )No results found for this or any previous visit (from  the past 240 hour(s)).    Anti-infectives    Start     Dose/Rate Route Frequency Ordered Stop   10/15/16 1800  vancomycin (VANCOCIN) IVPB 750 mg/150 ml premix     750 mg 150 mL/hr over 60 Minutes Intravenous Every 24 hours 10/14/16 1649     10/15/16 1800  ceFEPIme (MAXIPIME) 1 g in dextrose 5 % 50 mL IVPB     1 g 100 mL/hr over 30 Minutes Intravenous Every 24 hours 10/14/16 1649     10/14/16 1630  vancomycin (VANCOCIN) IVPB 1000 mg/200 mL premix     1,000 mg 200 mL/hr over 60 Minutes Intravenous  Once 10/14/16 1628 10/14/16 1916   10/14/16 1630  ceFEPIme (MAXIPIME) 2 g in dextrose 5 % 50 mL IVPB     2 g 100 mL/hr over 30 Minutes Intravenous  Once 10/14/16 1628 10/14/16 1801       Radiology Studies: Dg Chest Portable 1 View  Result Date: 10/14/2016 CLINICAL DATA:  Tachycardia today. EXAM: PORTABLE CHEST 1 VIEW COMPARISON:  PA and lateral chest  12/28/2012 and single view of the chest 08/02/2012. FINDINGS: There is left basilar airspace disease. The right lung is clear. The chest is hyperexpanded. No pneumothorax. Heart size is normal. Atherosclerosis noted. IMPRESSION: Left basilar airspace disease which could be due to atelectasis or infection. Recommend follow-up to clearing. Emphysema. Electronically Signed   By: Inge Rise M.D.   On: 10/14/2016 13:57   Dg Hand Complete Left  Result Date: 10/14/2016 CLINICAL DATA:  83 y/o F; status post fall with dorsal left hand pain. EXAM: LEFT HAND - COMPLETE 3+ VIEW COMPARISON:  None. FINDINGS: No acute fracture or dislocation identified. Interphalangeal osteoarthrosis with joint space narrowing and osteophytosis greatest in the second interphalangeal joints and third proximal interphalangeal joint. Moderate basal joint osteoarthrosis. Bones are demineralized. IMPRESSION: No acute fracture or dislocation identified. Electronically Signed   By: Kristine Garbe M.D.   On: 10/14/2016 18:02        Scheduled Meds: . apixaban  2.5 mg Oral BID  . insulin aspart  0-5 Units Subcutaneous QHS  . insulin aspart  0-9 Units Subcutaneous TID WC  . iron polysaccharides  150 mg Oral Q breakfast  . risperiDONE  0.25 mg Oral QHS  . rosuvastatin  5 mg Oral QHS  . sodium chloride flush  3 mL Intravenous Q12H  . sodium chloride flush  3 mL Intravenous Q12H   Continuous Infusions: . sodium chloride    . amiodarone    . ceFEPime (MAXIPIME) IV    . vancomycin       LOS: 0 days    Time spent: 35 min    [place], DO [facility] Pager [phone]  If 7PM-7AM, please contact night-coverage [email or web address] Password [email or web address] 10/15/2016, 1:22 PM

## 2016-10-15 NOTE — Progress Notes (Signed)
Patient in Atrial Fib this morning, with heart rate b/t 120s-150s.  Currently 122.  Patient at rest in bed with safety sitter at bedside.  Cecilie Kicks, APP and Dr. Eliseo Squires informed of Atrial Fib and heart rate.  Patient denies pain.

## 2016-10-15 NOTE — Progress Notes (Signed)
Patient trying to get out of bed.Asked patient where she trying to go .Patient stated ," Bathroom." Patient is very unsteady on her feet 2 person assist to get out of bed.Patient refusing help from staff stating ,"I can do it by myself .Leave me alone. Go help someone else.Where did you say I was ?" Attempt use pure wick.Patient does not like it pulled off.

## 2016-10-15 NOTE — Consult Note (Signed)
   [facility] CM Inpatient Consult   10/15/2016  Cynthia Wright 11-25-1924 [id number]  Patient assessed for re-admission in the Medicare ACO.  Patient with a recent hospitalization from a hip fracture and was in [facility] skilled facility for short term rehab.  Patient/family's current plan is for her to continue her rehab per social worker's note. Patient's primary care provider is Dr. Milagros Evener, [facility]. No community follow up assessed at this time. Patient will have transition of care follow up with primary care providers.  For questions, please contact:  Natividad Brood, RN BSN [facility] Liaison  [phone] business mobile phone Toll free office [phone]

## 2016-10-15 NOTE — Progress Notes (Signed)
Order received for ativan 0.5 mg IV x one and to call if restraints are needed.

## 2016-10-15 NOTE — Progress Notes (Signed)
Asked NP Chaney Malling for order for safety sitter to get patient out of restraints. Order received and restraints removed.

## 2016-10-15 NOTE — Progress Notes (Addendum)
Progress Note  Patient Name: Cynthia Wright Date of Encounter: 10/15/2016  Primary Cardiologist: Dr. Radford Pax  Subjective   On telemetry patient is tachycardic, rates to 130s-140s.  Inpatient Medications    Scheduled Meds: . amiodarone  100 mg Oral Daily  . apixaban  2.5 mg Oral BID  . insulin aspart  0-5 Units Subcutaneous QHS  . insulin aspart  0-9 Units Subcutaneous TID WC  . iron polysaccharides  150 mg Oral Q breakfast  . risperiDONE  0.25 mg Oral QHS  . rosuvastatin  5 mg Oral QHS  . sodium chloride flush  3 mL Intravenous Q12H  . sodium chloride flush  3 mL Intravenous Q12H   Continuous Infusions: . sodium chloride    . ceFEPime (MAXIPIME) IV    . vancomycin     PRN Meds: sodium chloride, acetaminophen **OR** acetaminophen, ondansetron **OR** ondansetron (ZOFRAN) IV, oxyCODONE, sodium chloride flush   Vital Signs    Vitals:   10/15/16 0039 10/15/16 0420 10/15/16 1132 10/15/16 1147  BP: 137/63 130/75 (!) 142/126 (!) 96/53  Pulse: (!) 115 (!) 113 (!) 138 (!) 122  Resp: 18 18 18    Temp: 98.4 F (36.9 C) 98.4 F (36.9 C) 98 F (36.7 C)   TempSrc: Oral Oral Oral   SpO2: 100% 100% 100%   Weight:  134 lb 1.6 oz (60.8 kg)    Height:        Intake/Output Summary (Last 24 hours) at 10/15/16 1148 Last data filed at 10/15/16 1135  Gross per 24 hour  Intake              970 ml  Output              400 ml  Net              570 ml   Filed Weights   10/14/16 1322 10/14/16 2044 10/15/16 0420  Weight: 132 lb (59.9 kg) 129 lb 12.8 oz (58.9 kg) 134 lb 1.6 oz (60.8 kg)    Telemetry    Tachycardia, sinus tachycardia vs afib/flutter - Personally Reviewed   Physical Exam   GEN: Well nourished, well developed HEENT: normal  Neck: no JVD, carotid bruits, or masses Cardiac: tachycardic. no murmurs, rubs, or gallops,no edema. Intact distal pulses bilaterally.  Respiratory: normal work of breathing GI: soft, nontender, nondistended, + BS MS: no deformity or  atrophy  Skin: warm and dry, no rash Neuro: Alert and Oriented x 3, Strength and sensation are intact Psych:   Full affect  Labs    Chemistry Recent Labs Lab 10/14/16 1345 10/14/16 1400 10/15/16 0425  NA 132* 133* 138  K 5.1 5.0 4.6  CL 98* 96* 104  CO2 27  --  26  GLUCOSE 211* 210* 167*  BUN 15 16 12   CREATININE 1.11* 1.00 0.97  CALCIUM 8.1*  --  8.3*  PROT 5.2*  --   --   ALBUMIN 2.5*  --   --   AST 14*  --   --   ALT 11*  --   --   ALKPHOS 85  --   --   BILITOT 0.5  --   --   GFRNONAA 43*  --  50*  GFRAA 50*  --  58*  ANIONGAP 7  --  8     Hematology Recent Labs Lab 10/14/16 1345 10/14/16 1400 10/15/16 0425  WBC 12.4*  --  9.2  RBC 3.21*  --  3.10*  HGB 10.3*  9.9* 10.1*  HCT 32.7* 29.0* 31.8*  MCV 101.9*  --  102.6*  MCH 32.1  --  32.6  MCHC 31.5  --  31.8  RDW 14.1  --  14.5  PLT 418*  --  379    Recent Labs Lab 10/14/16 1357  TROPIPOC 0.01    BNP Recent Labs Lab 10/14/16 1340  BNP 238.8*     Radiology    Dg Chest Portable 1 View  Result Date: 10/14/2016 CLINICAL DATA:  Tachycardia today. EXAM: PORTABLE CHEST 1 VIEW COMPARISON:  PA and lateral chest 12/28/2012 and single view of the chest 08/02/2012. FINDINGS: There is left basilar airspace disease. The right lung is clear. The chest is hyperexpanded. No pneumothorax. Heart size is normal. Atherosclerosis noted. IMPRESSION: Left basilar airspace disease which could be due to atelectasis or infection. Recommend follow-up to clearing. Emphysema. Electronically Signed   By: Cynthia Wright M.D.   On: 10/14/2016 13:57   Dg Hand Complete Left  Result Date: 10/14/2016 CLINICAL DATA:  83 y/o F; status post fall with dorsal left hand pain. EXAM: LEFT HAND - COMPLETE 3+ VIEW COMPARISON:  None. FINDINGS: No acute fracture or dislocation identified. Interphalangeal osteoarthrosis with joint space narrowing and osteophytosis greatest in the second interphalangeal joints and third proximal interphalangeal  joint. Moderate basal joint osteoarthrosis. Bones are demineralized. IMPRESSION: No acute fracture or dislocation identified. Electronically Signed   By: Cynthia Wright M.D.   On: 10/14/2016 18:02    Cardiac Studies   Echocardiogram 06/10/2013 Study Conclusions  - Left ventricle: The cavity size was normal. Systolic function was normal. The estimated ejection fraction was in the range of 60% to 65%. Wall motion was normal; there were no regional wall motion abnormalities. Features are consistent with a pseudonormal left ventricular filling pattern, with concomitant abnormal relaxation and increased filling pressure (grade 2 diastolic dysfunction). Doppler parameters are consistent with elevated ventricular end-diastolic filling pressure. - Aortic valve: Trileaflet; mildly thickened leaflets. Transvalvular velocity was within the normal range. There was no stenosis. Mild regurgitation. - Aortic root: The aortic root was normal in size. - Mitral valve: Calcified annulus. Mildly thickened leaflets . Mild to moderate regurgitation. - Left atrium: The atrium was mildly dilated. - Right ventricle: The cavity size was normal. Wall thickness was normal. Systolic function was normal. - Right atrium: The atrium was normal in size. - Tricuspid valve: Mild regurgitation. - Pulmonic valve: No regurgitation. - Pulmonary arteries: Systolic pressure was within the normal range. PA peak pressure: 38mm Hg (S). - Pericardium, extracardiac: There was no pericardial effusion.    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 Billy Wright, [name]    Assessment & Plan    Paroxysmal Atrial Fibrillation: Currently tachycardic. Difficult to assess rhythm on telemetry since rates are high. STAT EKG ordered.   She has been on DOAC and tolerating this well.   Signed, Cynthia Mako, PA-C  10/15/2016, 11:48 AM     History and all data above reviewed.  Patient examined.  I agree with the findings as above. She is very confused.  No chest pain or palpitations.   The  patient exam reveals LOP:RAFOADLKZ  ,  Lungs: Clear  ,  Abd: Positive bowel sounds, no rebound no guarding, Ext Mild edema  .  All available labs, radiology testing, previous records reviewed. Agree with documented assessment and plan. Atrial fib:  Rate is not controlled.  EKG atrial fib rate 132.  QTc is mildly prolonged.     BP is low.  I am going to start IV amiodarone.   Jeneen Rinks Manroop Jakubowicz  12:45 PM  10/15/2016

## 2016-10-15 NOTE — Progress Notes (Signed)
Patient keeps taking telemetry off.Patient refusing to allow staff to place her back on telemetry monitor.Patient swatting at staff hands and repeating ," I don't need that.Leave me alone."Text paged Chaney Malling NP.

## 2016-10-15 NOTE — Progress Notes (Signed)
Amiodarone bolus infusing then drip to run at 33.64ml/hr.  Delayed initiation of drip due to occluded IV.  IV d/c'd and new one started, then drip started.

## 2016-10-15 NOTE — Clinical Social Work Note (Signed)
Clinical Social Work Assessment  Patient Details  Name: Cynthia Wright MRN: [id number] Date of Birth: May 24, 1924  Date of referral:  10/15/16               Reason for consult:  Discharge Planning                Permission sought to share information with:  Facility Medical sales representative, Family Supports Permission granted to share information::  Yes, Verbal Permission Granted  Name::     Onalee Hua and [employer]  Agency::  [facility]  Relationship::  Son and daughter-in-law  Contact Information:  [phone]  Housing/Transportation Living arrangements for the past 2 months:  Skilled Nursing Facility, Single Family Home Source of Information:  Medical Team, Adult Children Patient Interpreter Needed:  None Criminal Activity/Legal Involvement Pertinent to Current Situation/Hospitalization:  No - Comment as needed Significant Relationships:  Adult Children Lives with:  Facility Resident Do you feel safe going back to the place where you live?  Yes Need for family participation in patient care:  Yes (Comment)  Care giving concerns:  Patient is a short-term rehab resident at [facility].   Social Worker assessment / plan:  Patient oriented to self only and no supports at bedside. CSW called patient's son's home number. His wife stated that he was not at home. Patient's daughter-in-law is in emergency contact list. CSW introduced role and explained that discharge planning would be discussed. Patient's daughter-in-law confirmed that the patient was admitted from [facility] and the plan is for her to return once stable for discharge to continue rehab following hip fracture. Patient was admitted to hospital on 6/30 and discharged to [facility] on 7/3. She she has met her Medicare inpatient 3-day stay within the past 30 days, she is able to return to SNF even though she is currently under observation. Patient does have a sitter right now and that would have to be discontinued for 24  hours prior to discharge back to SNF. Admissions coordinator is going to reach out to family to see if they want to pay for a bed hold. No further concerns. CSW encouraged patient's daughter-in-law to contact CSW as needed. CSW will continue to follow patient and her family for support and facilitate discharge back to SNF once medically stable.   Employment status:  Retired Health and safety inspector:  Medicare PT Recommendations:  Not assessed at this time Information / Referral to community resources:  Skilled Nursing Facility  Patient/Family's Response to care:  Patient oriented to self only. Patient's daughter-in-law agreeable to return to SNF. Patient's family supportive and involved in patient's care. Patient's daughter-in-law appreciated social work intervention.  Patient/Family's Understanding of and Emotional Response to Diagnosis, Current Treatment, and Prognosis:  Patient oriented to self only. Patient's daughter-in-law has a good understanding of the reason for admission and her need for continued rehab once stable for discharge. She noted that patient needs continued rehab for her hip fracture. Patient's daughter-in-law appears happy with hospital care.  Emotional Assessment Appearance:  Appears stated age Attitude/Demeanor/Rapport:  Unable to Assess Affect (typically observed):  Unable to Assess Orientation:  Oriented to Self Alcohol / Substance use:  Never Used Psych involvement (Current and /or in the community):  No (Comment)  Discharge Needs  Concerns to be addressed:  Care Coordination Readmission within the last 30 days:  Yes Current discharge risk:  Cognitively Impaired, Dependent with Mobility, Lives alone Barriers to Discharge:  Continued Medical Work up, Requiring sitter/restraints   Margarito Liner, LCSW  10/15/2016, 10:04 AM

## 2016-10-15 NOTE — Progress Notes (Signed)
Pulse 105 after 2nd bolus of 150 mg Amiodarone infused.

## 2016-10-15 NOTE — Progress Notes (Signed)
Concerned for patient safety continues to try to get out bed without assistance from staff.Patient continues to pull of telemetry leads. No family available to sit with patient.Order received for restraints.Informed patient restraints will be removed when no longer interfering with medical equipment and contracting for safety.Charge nurse Danae Chen at bedside agrees.

## 2016-10-16 DIAGNOSIS — I481 Persistent atrial fibrillation: Principal | ICD-10-CM

## 2016-10-16 LAB — BASIC METABOLIC PANEL
ANION GAP: 7 (ref 5–15)
BUN: 10 mg/dL (ref 6–20)
CO2: 27 mmol/L (ref 22–32)
Calcium: 8.1 mg/dL — ABNORMAL LOW (ref 8.9–10.3)
Chloride: 103 mmol/L (ref 101–111)
Creatinine, Ser: 0.88 mg/dL (ref 0.44–1.00)
GFR, EST NON AFRICAN AMERICAN: 57 mL/min — AB (ref >60)
GLUCOSE: 146 mg/dL — AB (ref 65–99)
POTASSIUM: 4 mmol/L (ref 3.5–5.1)
Sodium: 137 mmol/L (ref 135–145)

## 2016-10-16 LAB — GLUCOSE, CAPILLARY
Glucose-Capillary: 159 mg/dL — ABNORMAL HIGH (ref 65–99)
Glucose-Capillary: 143 mg/dL — ABNORMAL HIGH (ref 65–99)
Glucose-Capillary: 169 mg/dL — ABNORMAL HIGH (ref 65–99)
Glucose-Capillary: 127 mg/dL — ABNORMAL HIGH (ref 65–99)

## 2016-10-16 LAB — HIV ANTIBODY (ROUTINE TESTING W REFLEX): HIV Screen 4th Generation wRfx: NONREACTIVE

## 2016-10-16 LAB — CBC
HEMATOCRIT: 30.2 % — AB (ref 36.0–46.0)
Hemoglobin: 9.5 g/dL — ABNORMAL LOW (ref 12.0–15.0)
MCH: 32 pg (ref 26.0–34.0)
MCHC: 31.5 g/dL (ref 30.0–36.0)
MCV: 101.7 fL — AB (ref 78.0–100.0)
Platelets: 336 10*3/uL (ref 150–400)
RBC: 2.97 MIL/uL — AB (ref 3.87–5.11)
RDW: 14.1 % (ref 11.5–15.5)
WBC: 8.8 10*3/uL (ref 4.0–10.5)

## 2016-10-16 MED ORDER — DOXYCYCLINE HYCLATE 100 MG PO TABS
100.0000 mg | ORAL_TABLET | Freq: Two times a day (BID) | ORAL | Status: DC
  Administered 2016-10-16 – 2016-10-19 (×7): 100 mg via ORAL
  Filled 2016-10-16 (×7): qty 1

## 2016-10-16 MED ORDER — AMIODARONE HCL 100 MG PO TABS
100.0000 mg | ORAL_TABLET | Freq: Every day | ORAL | Status: DC
  Administered 2016-10-16: 100 mg via ORAL
  Filled 2016-10-16: qty 1

## 2016-10-16 MED ORDER — BISACODYL 10 MG RE SUPP
10.0000 mg | Freq: Every day | RECTAL | Status: DC | PRN
  Administered 2016-10-16: 10 mg via RECTAL
  Filled 2016-10-16: qty 1

## 2016-10-16 MED ORDER — DILTIAZEM HCL 30 MG PO TABS
30.0000 mg | ORAL_TABLET | Freq: Four times a day (QID) | ORAL | Status: DC
  Administered 2016-10-16 – 2016-10-17 (×4): 30 mg via ORAL
  Filled 2016-10-16 (×4): qty 1

## 2016-10-16 NOTE — Progress Notes (Signed)
Progress Note  Patient Name: Cynthia Wright Date of Encounter: 10/16/2016  Primary Cardiologist:   Dr. Radford Pax  Subjective   She denies any acute complaints.  No SOB.    Inpatient Medications    Scheduled Meds: . apixaban  2.5 mg Oral BID  . insulin aspart  0-5 Units Subcutaneous QHS  . insulin aspart  0-9 Units Subcutaneous TID WC  . iron polysaccharides  150 mg Oral Q breakfast  . rosuvastatin  5 mg Oral QHS  . sodium chloride flush  3 mL Intravenous Q12H  . sodium chloride flush  3 mL Intravenous Q12H   Continuous Infusions: . sodium chloride    . amiodarone 30 mg/hr (10/16/16 0945)  . ceFEPime (MAXIPIME) IV Stopped (10/15/16 1904)  . vancomycin Stopped (10/15/16 2140)   PRN Meds: sodium chloride, acetaminophen **OR** acetaminophen, bisacodyl, ondansetron **OR** ondansetron (ZOFRAN) IV, oxyCODONE, sodium chloride flush   Vital Signs    Vitals:   10/15/16 1900 10/15/16 2001 10/16/16 0309 10/16/16 0558  BP:  131/65  136/72  Pulse: (!) 105 (!) 107  (!) 111  Resp:    16  Temp:  98.4 F (36.9 C)  98.6 F (37 C)  TempSrc:  Oral  Oral  SpO2:  98%  96%  Weight:   127 lb 14.4 oz (58 kg)   Height:        Intake/Output Summary (Last 24 hours) at 10/16/16 1136 Last data filed at 10/16/16 0951  Gross per 24 hour  Intake           820.87 ml  Output              700 ml  Net           120.87 ml   Filed Weights   10/14/16 2044 10/15/16 0420 10/16/16 0309  Weight: 129 lb 12.8 oz (58.9 kg) 134 lb 1.6 oz (60.8 kg) 127 lb 14.4 oz (58 kg)    Telemetry    Atrial fib.  Rate is slightly better - Personally Reviewed  ECG    NA - Personally Reviewed  Physical Exam   GEN: No acute distress.   Neck: No  JVD Cardiac: IrregularRR, no murmurs, rubs, or gallops.  Respiratory: Clear  to auscultation bilaterally. GI: Soft, nontender, non-distended  MS:   Trace edema; No deformity. Neuro:  Nonfocal  Psych: Normal affect   Labs    Chemistry Recent Labs Lab  10/14/16 1345 10/14/16 1400 10/15/16 0425 10/16/16 0404  NA 132* 133* 138 137  K 5.1 5.0 4.6 4.0  CL 98* 96* 104 103  CO2 27  --  26 27  GLUCOSE 211* 210* 167* 146*  BUN 15 16 12 10   CREATININE 1.11* 1.00 0.97 0.88  CALCIUM 8.1*  --  8.3* 8.1*  PROT 5.2*  --   --   --   ALBUMIN 2.5*  --   --   --   AST 14*  --   --   --   ALT 11*  --   --   --   ALKPHOS 85  --   --   --   BILITOT 0.5  --   --   --   GFRNONAA 43*  --  50* 57*  GFRAA 50*  --  58* >60  ANIONGAP 7  --  8 7     Hematology Recent Labs Lab 10/14/16 1345 10/14/16 1400 10/15/16 0425 10/16/16 0404  WBC 12.4*  --  9.2 8.8  RBC 3.21*  --  3.10* 2.97*  HGB 10.3* 9.9* 10.1* 9.5*  HCT 32.7* 29.0* 31.8* 30.2*  MCV 101.9*  --  102.6* 101.7*  MCH 32.1  --  32.6 32.0  MCHC 31.5  --  31.8 31.5  RDW 14.1  --  14.5 14.1  PLT 418*  --  379 336    Cardiac EnzymesNo results for input(s): TROPONINI in the last 168 hours.  Recent Labs Lab 10/14/16 1357  TROPIPOC 0.01     BNP Recent Labs Lab 10/14/16 1340  BNP 238.8*     DDimer No results for input(s): DDIMER in the last 168 hours.   Radiology    Dg Chest Portable 1 View  Result Date: 10/14/2016 CLINICAL DATA:  Tachycardia today. EXAM: PORTABLE CHEST 1 VIEW COMPARISON:  PA and lateral chest 12/28/2012 and single view of the chest 08/02/2012. FINDINGS: There is left basilar airspace disease. The right lung is clear. The chest is hyperexpanded. No pneumothorax. Heart size is normal. Atherosclerosis noted. IMPRESSION: Left basilar airspace disease which could be due to atelectasis or infection. Recommend follow-up to clearing. Emphysema. Electronically Signed   By: Inge Rise M.D.   On: 10/14/2016 13:57   Dg Hand Complete Left  Result Date: 10/14/2016 CLINICAL DATA:  83 y/o F; status post fall with dorsal left hand pain. EXAM: LEFT HAND - COMPLETE 3+ VIEW COMPARISON:  None. FINDINGS: No acute fracture or dislocation identified. Interphalangeal osteoarthrosis  with joint space narrowing and osteophytosis greatest in the second interphalangeal joints and third proximal interphalangeal joint. Moderate basal joint osteoarthrosis. Bones are demineralized. IMPRESSION: No acute fracture or dislocation identified. Electronically Signed   By: Kristine Garbe M.D.   On: 10/14/2016 18:02    Cardiac Studies   NA  Patient Profile     83 y.o. 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, ED physician. Her CXR showed left basilar airspace disease concerning for possible PNA.    Assessment & Plan    ATRIAL FIB:  BP is up.  I am going to stop the IV amio.  Restart the PO amio back and I will start PO Cardizem.  It looks like her BP will allow this.    Signed, Minus Breeding, MD  10/16/2016, 11:36 AM

## 2016-10-16 NOTE — Progress Notes (Signed)
PROGRESS NOTE    Cynthia Wright  IOM:[id number] DOB: Jun 06, 1924 DOA: 10/14/2016 PCP: Aretta Nip, MD   Outpatient Specialists:     Brief Narrative:  Cynthia Wright is a 83 y.o. female with a medical history of paroxysmal atrial fibrillation status post DC CVA in 8453, diastolic heart failure, diabetes, hypertension, recent hip fracture with hospitalization, who presented to the emergency department with complaints of rapid heart rate. Supposedly patient had some chest pain however she is a poor historian secondary to her dementia. Currently patient denies any chest pain, shortness of breath, cough. She is currently at [facility] rehabilitation. EMS was called as patient reported chest pain and dyspnea at this facility. She was given 30 mg of Cardizem by mouth for tachycardia followed by nitroglycerin for her chest pain. Her symptoms continued and therefore was brought to the hospital. Per family she has not been eating or drinking well.  Per son, patient does have history of short-term memory loss.   Assessment & Plan:   Active Problems:   Persistent atrial fibrillation (HCC)   Chronic diastolic CHF (congestive heart failure) (HCC)   Type 2 diabetes mellitus with hyperlipidemia (HCC)   Postoperative anemia   Hyperlipidemia   A-fib (HCC)   Atrial fibrillation with RVR -Suspect secondary to possible pneumonia -Patient has history of paroxysmal atrial fibrillation -CHADVASC 6 (age, gender, CHF, DM, HTN) -Patient was given 1 dose of IV amiodarone, then converted to sinus tachycardia but went back into a fib with RVR- IV amiodarone infusing as a gtt overnight but now changed to PO and cardizem added -Cardiology consulted and appreciated  Sepsis secondary to HCAP -Upon admission, patient was tachycardic, tachypneic, with leukocytosis -Blood cultures currently NGTD -Chest x-ray left basilar airspace disease, atelectasis versus pneumonia -Placed on vancomycin and cefepime-  since afebrile, will change to PO abx) -Strep urine pneumonia antigen negative  Constipation -bowel regimen--  Has had liquid stool so suspect patient may be impacted  Essential hypertension -Will hold lisinopril and Lasix given that patient's blood pressure was very soft upon admission -Continue to monitor closely, restart medications possibly at half dose when BP remains stable  Diabetes mellitus, type II -Hold Januvia -SSI  Chronic diastolic CHF -Echocardiogram 06/10/2013 showed an EF of 64-68%, grade 2 diastolic dysfunction -holding lasix  Recent right femoral neck fracture -Patient was recently discharged on 09/30/2016 to nursing facility for rehabilitation -Continue pain control as needed with bowel regimen  Hyperlipidemia -Continue statin  Dementia -Per son, at bedside patient does have memory loss and dementia -Continue Risperdal and supportive care  Postoperative anemia -Patient recently discharged with postoperative anemia, right hip fracture -Hemoglobin currently 9.9 -Upon discharge on 09/30/2016 hemoglobin was 10.5 -Continue iron supplementation -Monitor CBC   DVT prophylaxis:  Fully anticoagulated   Code Status: Full Code   Family Communication: At bedside (patient's brother will be here this weekend for updates per son-- number in chart)  Disposition Plan:  SNF Friday/Saturday?   Consultants:  cards  Subjective: No SOB, no CP, thinks she is constipated Would like her hair to be curled  Objective: Vitals:   10/15/16 1900 10/15/16 2001 10/16/16 0309 10/16/16 0558  BP:  131/65  136/72  Pulse: (!) 105 (!) 107  (!) 111  Resp:    16  Temp:  98.4 F (36.9 C)  98.6 F (37 C)  TempSrc:  Oral  Oral  SpO2:  98%  96%  Weight:   58 kg (127 lb 14.4 oz)   Height:  Intake/Output Summary (Last 24 hours) at 10/16/16 1153 Last data filed at 10/16/16 0951  Gross per 24 hour  Intake           820.87 ml  Output              700 ml    Net           120.87 ml   Filed Weights   10/14/16 2044 10/15/16 0420 10/16/16 0309  Weight: 58.9 kg (129 lb 12.8 oz) 60.8 kg (134 lb 1.6 oz) 58 kg (127 lb 14.4 oz)    Examination:  General exam: up in chair- with sitter Respiratory system: diminished, no wheezing Cardiovascular system: rrr Gastrointestinal system: +BS, tender to palpation in lower quadrants Central nervous system: Alert and pleasant, able to answer questions appropriately  Extremities: moves all 4 ext. Skin: senile changes of the skin     Data Reviewed: I have personally reviewed following labs and imaging studies  CBC:  Recent Labs Lab 10/14/16 1345 10/14/16 1400 10/15/16 0425 10/16/16 0404  WBC 12.4*  --  9.2 8.8  NEUTROABS 10.8*  --   --   --   HGB 10.3* 9.9* 10.1* 9.5*  HCT 32.7* 29.0* 31.8* 30.2*  MCV 101.9*  --  102.6* 101.7*  PLT 418*  --  379 417   Basic Metabolic Panel:  Recent Labs Lab 10/14/16 1345 10/14/16 1400 10/15/16 0425 10/16/16 0404  NA 132* 133* 138 137  K 5.1 5.0 4.6 4.0  CL 98* 96* 104 103  CO2 27  --  26 27  GLUCOSE 211* 210* 167* 146*  BUN 15 16 12 10   CREATININE 1.11* 1.00 0.97 0.88  CALCIUM 8.1*  --  8.3* 8.1*  MG 2.5*  --   --   --    GFR: Estimated Creatinine Clearance: 39 mL/min (by C-G formula based on SCr of 0.88 mg/dL). Liver Function Tests:  Recent Labs Lab 10/14/16 1345  AST 14*  ALT 11*  ALKPHOS 85  BILITOT 0.5  PROT 5.2*  ALBUMIN 2.5*   No results for input(s): LIPASE, AMYLASE in the last 168 hours. No results for input(s): AMMONIA in the last 168 hours. Coagulation Profile: No results for input(s): INR, PROTIME in the last 168 hours. Cardiac Enzymes: No results for input(s): CKTOTAL, CKMB, CKMBINDEX, TROPONINI in the last 168 hours. BNP (last 3 results) No results for input(s): PROBNP in the last 8760 hours. HbA1C: No results for input(s): HGBA1C in the last 72 hours. CBG:  Recent Labs Lab 10/15/16 0745 10/15/16 1140  10/15/16 1646 10/15/16 2111 10/16/16 0750  GLUCAP 167* 150* 170* 152* 159*   Lipid Profile: No results for input(s): CHOL, HDL, LDLCALC, TRIG, CHOLHDL, LDLDIRECT in the last 72 hours. Thyroid Function Tests: No results for input(s): TSH, T4TOTAL, FREET4, T3FREE, THYROIDAB in the last 72 hours. Anemia Panel: No results for input(s): VITAMINB12, FOLATE, FERRITIN, TIBC, IRON, RETICCTPCT in the last 72 hours. Urine analysis:    Component Value Date/Time   COLORURINE YELLOW 10/14/2016 1510   APPEARANCEUR HAZY (A) 10/14/2016 1510   LABSPEC 1.013 10/14/2016 1510   PHURINE 5.0 10/14/2016 1510   GLUCOSEU NEGATIVE 10/14/2016 1510   HGBUR NEGATIVE 10/14/2016 1510   BILIRUBINUR NEGATIVE 10/14/2016 1510   KETONESUR NEGATIVE 10/14/2016 1510   PROTEINUR NEGATIVE 10/14/2016 1510   NITRITE NEGATIVE 10/14/2016 [place] 10/14/2016 1510     ) Recent Results (from the past 240 hour(s))  Blood culture (routine x 2)  Status: None (Preliminary result)   Collection Time: 10/14/16  4:10 PM  Result Value Ref Range Status   Specimen Description BLOOD LEFT ANTECUBITAL  Final   Special Requests   Final    BOTTLES DRAWN AEROBIC AND ANAEROBIC Blood Culture adequate volume   Culture NO GROWTH 2 DAYS  Final   Report Status PENDING  Incomplete  Blood culture (routine x 2)     Status: None (Preliminary result)   Collection Time: 10/14/16  4:18 PM  Result Value Ref Range Status   Specimen Description BLOOD RIGHT ANTECUBITAL  Final   Special Requests   Final    BOTTLES DRAWN AEROBIC AND ANAEROBIC Blood Culture adequate volume   Culture NO GROWTH 2 DAYS  Final   Report Status PENDING  Incomplete      Anti-infectives    Start     Dose/Rate Route Frequency Ordered Stop   10/15/16 1800  vancomycin (VANCOCIN) IVPB 750 mg/150 ml premix     750 mg 150 mL/hr over 60 Minutes Intravenous Every 24 hours 10/14/16 1649     10/15/16 1800  ceFEPIme (MAXIPIME) 1 g in dextrose 5 % 50 mL IVPB      1 g 100 mL/hr over 30 Minutes Intravenous Every 24 hours 10/14/16 1649     10/14/16 1630  vancomycin (VANCOCIN) IVPB 1000 mg/200 mL premix     1,000 mg 200 mL/hr over 60 Minutes Intravenous  Once 10/14/16 1628 10/14/16 1916   10/14/16 1630  ceFEPIme (MAXIPIME) 2 g in dextrose 5 % 50 mL IVPB     2 g 100 mL/hr over 30 Minutes Intravenous  Once 10/14/16 1628 10/14/16 1801       Radiology Studies: Dg Chest Portable 1 View  Result Date: 10/14/2016 CLINICAL DATA:  Tachycardia today. EXAM: PORTABLE CHEST 1 VIEW COMPARISON:  PA and lateral chest 12/28/2012 and single view of the chest 08/02/2012. FINDINGS: There is left basilar airspace disease. The right lung is clear. The chest is hyperexpanded. No pneumothorax. Heart size is normal. Atherosclerosis noted. IMPRESSION: Left basilar airspace disease which could be due to atelectasis or infection. Recommend follow-up to clearing. Emphysema. Electronically Signed   By: Inge Rise M.D.   On: 10/14/2016 13:57   Dg Hand Complete Left  Result Date: 10/14/2016 CLINICAL DATA:  83 y/o F; status post fall with dorsal left hand pain. EXAM: LEFT HAND - COMPLETE 3+ VIEW COMPARISON:  None. FINDINGS: No acute fracture or dislocation identified. Interphalangeal osteoarthrosis with joint space narrowing and osteophytosis greatest in the second interphalangeal joints and third proximal interphalangeal joint. Moderate basal joint osteoarthrosis. Bones are demineralized. IMPRESSION: No acute fracture or dislocation identified. Electronically Signed   By: Kristine Garbe M.D.   On: 10/14/2016 18:02        Scheduled Meds: . amiodarone  100 mg Oral Daily  . apixaban  2.5 mg Oral BID  . diltiazem  30 mg Oral Q6H  . insulin aspart  0-5 Units Subcutaneous QHS  . insulin aspart  0-9 Units Subcutaneous TID WC  . iron polysaccharides  150 mg Oral Q breakfast  . rosuvastatin  5 mg Oral QHS  . sodium chloride flush  3 mL Intravenous Q12H  . sodium  chloride flush  3 mL Intravenous Q12H   Continuous Infusions: . sodium chloride    . ceFEPime (MAXIPIME) IV Stopped (10/15/16 1904)  . vancomycin Stopped (10/15/16 2140)     LOS: 1 day    Time spent: 35 min    Nazyia Gaugh  Alison Stalling, DO [facility] Pager [phone]  If 7PM-7AM, please contact night-coverage [email or web address] Password [facility] 10/16/2016, 11:53 AM

## 2016-10-16 NOTE — Clinical Social Work Note (Signed)
CSW notified [facility] admissions coordinator of plan for discharge tomorrow. PT evaluation pending. Sitter order will need to be dc'ed today. MD aware.  Cynthia Wright, [place]

## 2016-10-17 DIAGNOSIS — E785 Hyperlipidemia, unspecified: Secondary | ICD-10-CM

## 2016-10-17 DIAGNOSIS — J189 Pneumonia, unspecified organism: Secondary | ICD-10-CM

## 2016-10-17 DIAGNOSIS — I5032 Chronic diastolic (congestive) heart failure: Secondary | ICD-10-CM

## 2016-10-17 LAB — BASIC METABOLIC PANEL
Anion gap: 9 (ref 5–15)
BUN: 9 mg/dL (ref 6–20)
CO2: 26 mmol/L (ref 22–32)
Calcium: 8.3 mg/dL — ABNORMAL LOW (ref 8.9–10.3)
Chloride: 104 mmol/L (ref 101–111)
Creatinine, Ser: 0.91 mg/dL (ref 0.44–1.00)
GFR calc Af Amer: >60 mL/min (ref >60)
GFR calc non Af Amer: 54 mL/min — ABNORMAL LOW (ref >60)
Glucose, Bld: 149 mg/dL — ABNORMAL HIGH (ref 65–99)
Potassium: 4 mmol/L (ref 3.5–5.1)
Sodium: 139 mmol/L (ref 135–145)

## 2016-10-17 LAB — CBC
HEMATOCRIT: 30.4 % — AB (ref 36.0–46.0)
Hemoglobin: 9.5 g/dL — ABNORMAL LOW (ref 12.0–15.0)
MCH: 31.9 pg (ref 26.0–34.0)
MCHC: 31.3 g/dL (ref 30.0–36.0)
MCV: 102 fL — AB (ref 78.0–100.0)
PLATELETS: 315 10*3/uL (ref 150–400)
RBC: 2.98 MIL/uL — AB (ref 3.87–5.11)
RDW: 14.2 % (ref 11.5–15.5)
WBC: 7.7 10*3/uL (ref 4.0–10.5)

## 2016-10-17 LAB — GLUCOSE, CAPILLARY
GLUCOSE-CAPILLARY: 126 mg/dL — AB (ref 65–99)
GLUCOSE-CAPILLARY: 118 mg/dL — AB (ref 65–99)
GLUCOSE-CAPILLARY: 146 mg/dL — AB (ref 65–99)
Glucose-Capillary: 155 mg/dL — ABNORMAL HIGH (ref 65–99)

## 2016-10-17 MED ORDER — DILTIAZEM HCL 60 MG PO TABS
60.0000 mg | ORAL_TABLET | Freq: Four times a day (QID) | ORAL | Status: DC
  Administered 2016-10-17 – 2016-10-19 (×9): 60 mg via ORAL
  Filled 2016-10-17 (×9): qty 1

## 2016-10-17 MED ORDER — AMIODARONE HCL 200 MG PO TABS
200.0000 mg | ORAL_TABLET | Freq: Every day | ORAL | Status: DC
  Administered 2016-10-17 – 2016-10-19 (×3): 200 mg via ORAL
  Filled 2016-10-17 (×3): qty 1

## 2016-10-17 NOTE — Progress Notes (Deleted)
Patient complaining of leg pain, MD notified. Daughter at bedside.

## 2016-10-17 NOTE — Plan of Care (Signed)
Problem: Safety: Goal: Ability to remain free from injury will improve Outcome: Progressing Pt. Has safety sitter at bedside

## 2016-10-17 NOTE — Progress Notes (Signed)
PROGRESS NOTE [facility]   Cynthia Wright   HUD:[id number] DOB: 10/14/1924  DOA: 10/14/2016 PCP: Aretta Nip, MD   Brief Narrative:  CC: chest pain  83 yo female with history of dementia, DM2, chronic diastolic CHF, Paroxysmal A fib on eliquis and amiodarone, HTN, HLD, and a recent hospitalization for rt hip fracture. She was transported from [facility] rehab with complaints of chest pain, dyspnea, and afib with RVR. Poor historian due to dementia; She denied symptoms in ED. At [facility] was given Cardizem and nitro for her elevated HR and CP. Symptoms persisted with rates in 130's when EMS was called   Family also reports rectal bleeding with blood found on patient's pad. She has been having painful BM, and has a history of hemorrhoidal bleeding. Denies melena. She has not been eating or drinking per family. Sustained a fall on the 16th where she caught her self and denies any pain, head trauma, LOC. No fevers, no persistent cough, no urinary symptoms, or vomiting   Subjective: 7-20 She sitting in bed comfortably and pleasantly demented. She is currently not reporting chills, fever, chest pain, sob, or pain anywhere. Currently in Afib with RVR, cardiology considering cardioversion.   Assessment & Plan:   Active Problems:   Persistent atrial fibrillation (HCC)   Chronic diastolic CHF (congestive heart failure) (HCC)   Type 2 diabetes mellitus with hyperlipidemia (HCC)   Postoperative anemia   Hyperlipidemia   A-fib (HCC)     Persistent atrial fibrillation - used to be paroxysmal possibly complicated by pneumonia/atelcetasis found on CXR  Cardiology consulted.   Eliquis  PO amiodarone   PO Cardizem  Start IV labetalol?    Considering cardioversion     HCAP complicated by sepsis - Recent hospitalization and cxr showing area of concern with atelectasis/infection and initial vitals of RR 21 and HR 132. deescalated from cefepime and vanc yesterday  Continue  doxy    Constipation  Stool softeners    Chronic diastolic CHF  Holding lasix and lisinopril    Type 2 diabetes mellitus - Stable DM treated with Januvia at home  Sliding scale insulin  Hold januvia    Hyperlipidemia  Crestor    Dementia  Continue risperdal      Postoperative anemia - from recent hospitalization due to right hip fracture  PO iron    History of Falls  Consult PT/OT     DVT prophylaxis: eliquis Code Status: full Family Communication: none Disposition Plan: 7/21 needs cardiology follow up.   Consultants:   PT/OT, cardiology  Procedures:   none  Antimicrobials:  Doxy day 2   Objective: Vitals:   10/16/16 2039 10/16/16 2346 10/17/16 0502 10/17/16 0745  BP: 112/72 118/80 107/64 139/65  Pulse: 77   97  Resp:   18   Temp: 98.5 F (36.9 C)  98.2 F (36.8 C) 97.6 F (36.4 C)  TempSrc: Oral  Oral Oral  SpO2: 95%  95% 96%  Weight:   62.6 kg (138 lb)   Height:        Intake/Output Summary (Last 24 hours) at 10/17/16 1040 Last data filed at 10/17/16 0930  Gross per 24 hour  Intake            956.8 ml  Output              550 ml  Net            406.8 ml   Filed Weights   10/15/16 0420 10/16/16  0309 10/17/16 0502  Weight: 60.8 kg (134 lb 1.6 oz) 58 kg (127 lb 14.4 oz) 62.6 kg (138 lb)    Examination:  General exam: Appears calm and comfortable  HEENT: NCAT, PERRLA, OP moist and clear Respiratory system: Clear to auscultation. No wheezes,crackle or rhonchi Cardiovascular system: Irregularly irregular in AFIB. No JVD, murmurs, rubs or gallops Gastrointestinal system: Abdomen is nondistended, soft and nontender. Normal bowel sounds heard. Central nervous system: Alert and oriented. No focal neurological deficits. Extremities: No pedal edema. Skin: No rashes, lesions or ulcers Psychiatry: appears confused, unsure why she is in hospitall. Mood & affect appropriate.    Data Reviewed: I have personally reviewed following labs and  imaging studies  CBC:  Recent Labs Lab 10/14/16 1345 10/14/16 1400 10/15/16 0425 10/16/16 0404 10/17/16 0403  WBC 12.4*  --  9.2 8.8 7.7  NEUTROABS 10.8*  --   --   --   --   HGB 10.3* 9.9* 10.1* 9.5* 9.5*  HCT 32.7* 29.0* 31.8* 30.2* 30.4*  MCV 101.9*  --  102.6* 101.7* 102.0*  PLT 418*  --  379 336 115   Basic Metabolic Panel:  Recent Labs Lab 10/14/16 1345 10/14/16 1400 10/15/16 0425 10/16/16 0404 10/17/16 0403  NA 132* 133* 138 137 139  K 5.1 5.0 4.6 4.0 4.0  CL 98* 96* 104 103 104  CO2 27  --  26 27 26   GLUCOSE 211* 210* 167* 146* 149*  BUN 15 16 12 10 9   CREATININE 1.11* 1.00 0.97 0.88 0.91  CALCIUM 8.1*  --  8.3* 8.1* 8.3*  MG 2.5*  --   --   --   --    GFR: Estimated Creatinine Clearance: 37.7 mL/min (by C-G formula based on SCr of 0.91 mg/dL). Liver Function Tests:  Recent Labs Lab 10/14/16 1345  AST 14*  ALT 11*  ALKPHOS 85  BILITOT 0.5  PROT 5.2*  ALBUMIN 2.5*   No results for input(s): LIPASE, AMYLASE in the last 168 hours. No results for input(s): AMMONIA in the last 168 hours. Coagulation Profile: No results for input(s): INR, PROTIME in the last 168 hours. Cardiac Enzymes: No results for input(s): CKTOTAL, CKMB, CKMBINDEX, TROPONINI in the last 168 hours. BNP (last 3 results) No results for input(s): PROBNP in the last 8760 hours. HbA1C: No results for input(s): HGBA1C in the last 72 hours. CBG:  Recent Labs Lab 10/16/16 0750 10/16/16 1206 10/16/16 1657 10/16/16 2149 10/17/16 0739  GLUCAP 159* 143* 169* 127* 155*   Lipid Profile: No results for input(s): CHOL, HDL, LDLCALC, TRIG, CHOLHDL, LDLDIRECT in the last 72 hours. Thyroid Function Tests: No results for input(s): TSH, T4TOTAL, FREET4, T3FREE, THYROIDAB in the last 72 hours. Anemia Panel: No results for input(s): VITAMINB12, FOLATE, FERRITIN, TIBC, IRON, RETICCTPCT in the last 72 hours. Sepsis Labs:  Recent Labs Lab 10/14/16 1646  LATICACIDVEN 1.17    Recent  Results (from the past 240 hour(s))  Blood culture (routine x 2)     Status: None (Preliminary result)   Collection Time: 10/14/16  4:10 PM  Result Value Ref Range Status   Specimen Description BLOOD LEFT ANTECUBITAL  Final   Special Requests   Final    BOTTLES DRAWN AEROBIC AND ANAEROBIC Blood Culture adequate volume   Culture NO GROWTH 3 DAYS  Final   Report Status PENDING  Incomplete  Blood culture (routine x 2)     Status: None (Preliminary result)   Collection Time: 10/14/16  4:18 PM  Result  Value Ref Range Status   Specimen Description BLOOD RIGHT ANTECUBITAL  Final   Special Requests   Final    BOTTLES DRAWN AEROBIC AND ANAEROBIC Blood Culture adequate volume   Culture NO GROWTH 3 DAYS  Final   Report Status PENDING  Incomplete         Radiology Studies: No results found.    Scheduled Meds: . amiodarone  200 mg Oral Daily  . apixaban  2.5 mg Oral BID  . diltiazem  60 mg Oral Q6H  . doxycycline  100 mg Oral BID  . insulin aspart  0-5 Units Subcutaneous QHS  . insulin aspart  0-9 Units Subcutaneous TID WC  . iron polysaccharides  150 mg Oral Q breakfast  . rosuvastatin  5 mg Oral QHS  . sodium chloride flush  3 mL Intravenous Q12H  . sodium chloride flush  3 mL Intravenous Q12H   Continuous Infusions: . sodium chloride       LOS: 2 days    Time spent:    Mitzie Na, PA-S Pager: Text Page via [email or web address]  [phone]  If 7PM-7AM, please contact night-coverage [email or web address] Password [email or web address] 10/17/2016, 10:40 AM

## 2016-10-17 NOTE — Progress Notes (Signed)
Paged MD about patients HR sustaining in 130's, and when she gets up HR may go up to 150-170's. HR has been very sporadic 110's when sleep. MD says to keep patient in bed and consult with rounding MD's.

## 2016-10-17 NOTE — Progress Notes (Signed)
Sitter at bedside, patient has been pleasant.

## 2016-10-17 NOTE — Progress Notes (Signed)
PT Cancellation Note  Patient Details Name: BRYONY KAMAN MRN: [id number] DOB: 11/22/1924   Cancelled Treatment:    Reason Eval/Treat Not Completed: Medical issues which prohibited therapy (HR 150-170s with activity per RN. Will hold today. Will follow.  )   Philomena Doheny 10/17/2016, 9:33 AM [phone]

## 2016-10-17 NOTE — Progress Notes (Signed)
Progress Note  Patient Name: Cynthia Wright Date of Encounter: 10/17/2016  Primary Cardiologist:   Dr. Radford Pax  Subjective   She is confused.  She has no acute complaints.  She does not feel her heart racing.    Inpatient Medications    Scheduled Meds: . amiodarone  100 mg Oral Daily  . apixaban  2.5 mg Oral BID  . diltiazem  30 mg Oral Q6H  . doxycycline  100 mg Oral BID  . insulin aspart  0-5 Units Subcutaneous QHS  . insulin aspart  0-9 Units Subcutaneous TID WC  . iron polysaccharides  150 mg Oral Q breakfast  . rosuvastatin  5 mg Oral QHS  . sodium chloride flush  3 mL Intravenous Q12H  . sodium chloride flush  3 mL Intravenous Q12H   Continuous Infusions: . sodium chloride     PRN Meds: sodium chloride, acetaminophen **OR** acetaminophen, bisacodyl, ondansetron **OR** ondansetron (ZOFRAN) IV, oxyCODONE, sodium chloride flush   Vital Signs    Vitals:   10/16/16 2039 10/16/16 2346 10/17/16 0502 10/17/16 0745  BP: 112/72 118/80 107/64 139/65  Pulse: 77   97  Resp:   18   Temp: 98.5 F (36.9 C)  98.2 F (36.8 C) 97.6 F (36.4 C)  TempSrc: Oral  Oral Oral  SpO2: 95%  95% 96%  Weight:   138 lb (62.6 kg)   Height:        Intake/Output Summary (Last 24 hours) at 10/17/16 0911 Last data filed at 10/17/16 0757  Gross per 24 hour  Intake           1266.8 ml  Output              950 ml  Net            316.8 ml   Filed Weights   10/15/16 0420 10/16/16 0309 10/17/16 0502  Weight: 134 lb 1.6 oz (60.8 kg) 127 lb 14.4 oz (58 kg) 138 lb (62.6 kg)    Telemetry    Atrial fib with rapid rate.  - Personally Reviewed  ECG    NA - Personally Reviewed  Physical Exam   GEN: No  acute distress.   Neck: No  JVD Cardiac: Irregular RR, no murmurs, rubs, or gallops.  Respiratory: Clear   to auscultation bilaterally. GI: Soft, nontender, non-distended, normal bowel sounds  MS:   edema; No deformity. Neuro:   Nonfocal  Psych: Pleasantly confused  Labs      Chemistry Recent Labs Lab 10/14/16 1345  10/15/16 0425 10/16/16 0404 10/17/16 0403  NA 132*  < > 138 137 139  K 5.1  < > 4.6 4.0 4.0  CL 98*  < > 104 103 104  CO2 27  --  26 27 26   GLUCOSE 211*  < > 167* 146* 149*  BUN 15  < > 12 10 9   CREATININE 1.11*  < > 0.97 0.88 0.91  CALCIUM 8.1*  --  8.3* 8.1* 8.3*  PROT 5.2*  --   --   --   --   ALBUMIN 2.5*  --   --   --   --   AST 14*  --   --   --   --   ALT 11*  --   --   --   --   ALKPHOS 85  --   --   --   --   BILITOT 0.5  --   --   --   --  GFRNONAA 43*  --  50* 57* 54*  GFRAA 50*  --  58* >60 >60  ANIONGAP 7  --  8 7 9   < > = values in this interval not displayed.   Hematology  Recent Labs Lab 10/15/16 0425 10/16/16 0404 10/17/16 0403  WBC 9.2 8.8 7.7  RBC 3.10* 2.97* 2.98*  HGB 10.1* 9.5* 9.5*  HCT 31.8* 30.2* 30.4*  MCV 102.6* 101.7* 102.0*  MCH 32.6 32.0 31.9  MCHC 31.8 31.5 31.3  RDW 14.5 14.1 14.2  PLT 379 336 315    Cardiac EnzymesNo results for input(s): TROPONINI in the last 168 hours.   Recent Labs Lab 10/14/16 1357  TROPIPOC 0.01     BNP  Recent Labs Lab 10/14/16 1340  BNP 238.8*     DDimer No results for input(s): DDIMER in the last 168 hours.   Radiology    No results found.  Cardiac Studies   NA  Patient Profile     83 y.o. 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, ED physician. Her CXR showed left basilar airspace disease concerning for possible PNA.    Assessment & Plan    ATRIAL FIB:   I stopped IV amio yesterday and restarted PO.  Started PO Cardizem.  Rate is still not well controlled.  She is on Eliquis.  This was paroxysmal when she came in but persistent now.   She could be cardioverted if needed.  However, today I will increase the Cardizem and amiodarone PO.  We are holding lasix and lisinopril for now.    Signed, Minus Breeding, MD  10/17/2016, 9:11 AM

## 2016-10-18 DIAGNOSIS — I4891 Unspecified atrial fibrillation: Secondary | ICD-10-CM

## 2016-10-18 LAB — GLUCOSE, CAPILLARY
Glucose-Capillary: 142 mg/dL — ABNORMAL HIGH (ref 65–99)
Glucose-Capillary: 148 mg/dL — ABNORMAL HIGH (ref 65–99)
Glucose-Capillary: 143 mg/dL — ABNORMAL HIGH (ref 65–99)

## 2016-10-18 NOTE — Progress Notes (Signed)
Patient in sinus rhythm today. Continue current dose of diltiazem. Plan to load with amiodarone with 400 mg BID for 2 weeks, 200 mg BID for 2 weeks, and 200 mg daily after that. Please call cardiology if further issues.  Adylee Leonardo Curt Bears, MD 10/18/2016 11:00 AM

## 2016-10-18 NOTE — Progress Notes (Addendum)
PROGRESS NOTE    Cynthia Wright  QPY:[id number] DOB: [birth year] DOA: 10/14/2016 PCP: Aretta Nip, MD   Brief Narrative: 83 year old elderly female with history of paroxysmal atrial fibrillation on chronic anticoagulation, hypertension, dyslipidemia, chronic diastolic congestive heart failure, type 2 diabetes, dementia, recent hospitalization for right hip fracture admitted from rehabilitation facility for the evaluation of chest pain and marked dyspnea and found to have atrial fibrillation with RVR. Patient was also found to have atelectasis therefore started on antibiotics for presumed healthcare associated pneumonia/sepsis. Patient is clinically improving. The antibiotics narrowed down to doxycycline orally. She was found to have elevated heart rate to 140s overnight. Cardiologist are adjusting cardiac medications. Patient is clinically asymptomatic.  Assessment & Plan:   # A. fib with RVR:  -Patient takes amiodarone at home. Initially treated with IV amiodarone and switched to higher dose of oral amiodarone to 200 mg. Patient is on sinus rhythm today. Continue diltiazem and amiodarone. Follow-up cardiology is planning and accommodation. - Continue to monitor in telemetry. On eliquis.  Addendum: I reviewed today's note by Dr. Tomasa Blase regarding plan for loading amiodarone. Pt is chronically on amiodarone and the dose increased to 200 mg by Dr. Percival Spanish yesterday. Pt is in SR and HR is controlled now. I am not sure if we need to load with amiodarone at this time. Unable to contact Dr. Tomasa Blase therefore consulted Dr. Liana Crocker. He will follow up on amiodarone dosing recommendation.   #Type 2 diabetes: Continue current insulin regimen. Monitor blood sugar level  #Dyslipidemia: Continue statin.  #Iron deficiency anemia: Continue oral iron.  #Presumed healthcare associated pneumonia/sepsis: Currently on oral doxycycline. I doubt if patient really has pneumonia. Chest x-ray is mostly  atelectasis. Plan to discontinue antibiotics in few days. Cultures negative.  #Dementia with behavioral disturbance: Plan to discontinue sitter today. Discussed with social worker regarding possible discharge to rehabilitation facility tomorrow. Continue supportive care and frequent re-orientation. -Discontinue sitter today morning. Patient is pleasant and calm. Patient will likely discharge to skilled facility tomorrow, after 24 hours off sitter.  # Acute urinary retention: bladder scan 750, voided 350 cc and post void 400 cc. Encourage ambulation and mobility. Dicussed with RN  Active Problems:   Persistent atrial fibrillation (HCC)   Chronic diastolic CHF (congestive heart failure) (HCC)   Type 2 diabetes mellitus with hyperlipidemia (HCC)   Postoperative anemia   Hyperlipidemia   Atrial fibrillation with RVR ([place])   Healthcare-associated pneumonia  DVT prophylaxis:eliquis Code Status:full Family Communication: No family at bedside Disposition Plan: Likely discharge to skilled facility in 1-2 days    Consultants:   Cardiologist  Procedures: None Antimicrobials: Doxycycline  Subjective: Seen and examined at bedside. Patient was calm comfortable. Denied headache, dizziness, chest pain, shortness of breath.  Objective: Vitals:   10/17/16 1150 10/17/16 2103 10/18/16 0227 10/18/16 0629  BP: (!) 145/60 104/78 135/68 (!) 132/55  Pulse: 96 91 100 67  Resp:  (!) 22  16  Temp: 97.6 F (36.4 C)   97.8 F (36.6 C)  TempSrc: Oral   Oral  SpO2: 99% 95%  97%  Weight:    60.8 kg (134 lb)  Height:        Intake/Output Summary (Last 24 hours) at 10/18/16 1107 Last data filed at 10/18/16 0916  Gross per 24 hour  Intake              480 ml  Output               11  ml  Net              469 ml   Filed Weights   10/16/16 0309 10/17/16 0502 10/18/16 0629  Weight: 58 kg (127 lb 14.4 oz) 62.6 kg (138 lb) 60.8 kg (134 lb)    Examination:  General exam: Appears calm and  comfortable  Respiratory system: Clear to auscultation. Respiratory effort normal. No wheezing or crackle Cardiovascular system: S1 & S2 heard, RRR.  No pedal edema. Gastrointestinal system: Abdomen is nondistended, soft and nontender. Normal bowel sounds heard. Central nervous system: Alert awake and following commands Extremities: Symmetric 5 x 5 power. Skin: No rashes, lesions or ulcers     Data Reviewed: I have personally reviewed following labs and imaging studies  CBC:  Recent Labs Lab 10/14/16 1345 10/14/16 1400 10/15/16 0425 10/16/16 0404 10/17/16 0403  WBC 12.4*  --  9.2 8.8 7.7  NEUTROABS 10.8*  --   --   --   --   HGB 10.3* 9.9* 10.1* 9.5* 9.5*  HCT 32.7* 29.0* 31.8* 30.2* 30.4*  MCV 101.9*  --  102.6* 101.7* 102.0*  PLT 418*  --  379 336 741   Basic Metabolic Panel:  Recent Labs Lab 10/14/16 1345 10/14/16 1400 10/15/16 0425 10/16/16 0404 10/17/16 0403  NA 132* 133* 138 137 139  K 5.1 5.0 4.6 4.0 4.0  CL 98* 96* 104 103 104  CO2 27  --  26 27 26   GLUCOSE 211* 210* 167* 146* 149*  BUN 15 16 12 10 9   CREATININE 1.11* 1.00 0.97 0.88 0.91  CALCIUM 8.1*  --  8.3* 8.1* 8.3*  MG 2.5*  --   --   --   --    GFR: Estimated Creatinine Clearance: 37.7 mL/min (by C-G formula based on SCr of 0.91 mg/dL). Liver Function Tests:  Recent Labs Lab 10/14/16 1345  AST 14*  ALT 11*  ALKPHOS 85  BILITOT 0.5  PROT 5.2*  ALBUMIN 2.5*   No results for input(s): LIPASE, AMYLASE in the last 168 hours. No results for input(s): AMMONIA in the last 168 hours. Coagulation Profile: No results for input(s): INR, PROTIME in the last 168 hours. Cardiac Enzymes: No results for input(s): CKTOTAL, CKMB, CKMBINDEX, TROPONINI in the last 168 hours. BNP (last 3 results) No results for input(s): PROBNP in the last 8760 hours. HbA1C: No results for input(s): HGBA1C in the last 72 hours. CBG:  Recent Labs Lab 10/17/16 0739 10/17/16 1215 10/17/16 1655 10/17/16 2124  10/18/16 0736  GLUCAP 155* 126* 118* 146* 142*   Lipid Profile: No results for input(s): CHOL, HDL, LDLCALC, TRIG, CHOLHDL, LDLDIRECT in the last 72 hours. Thyroid Function Tests: No results for input(s): TSH, T4TOTAL, FREET4, T3FREE, THYROIDAB in the last 72 hours. Anemia Panel: No results for input(s): VITAMINB12, FOLATE, FERRITIN, TIBC, IRON, RETICCTPCT in the last 72 hours. Sepsis Labs:  Recent Labs Lab 10/14/16 1646  LATICACIDVEN 1.17    Recent Results (from the past 240 hour(s))  Blood culture (routine x 2)     Status: None (Preliminary result)   Collection Time: 10/14/16  4:10 PM  Result Value Ref Range Status   Specimen Description BLOOD LEFT ANTECUBITAL  Final   Special Requests   Final    BOTTLES DRAWN AEROBIC AND ANAEROBIC Blood Culture adequate volume   Culture NO GROWTH 3 DAYS  Final   Report Status PENDING  Incomplete  Blood culture (routine x 2)     Status: None (Preliminary result)  Collection Time: 10/14/16  4:18 PM  Result Value Ref Range Status   Specimen Description BLOOD RIGHT ANTECUBITAL  Final   Special Requests   Final    BOTTLES DRAWN AEROBIC AND ANAEROBIC Blood Culture adequate volume   Culture NO GROWTH 3 DAYS  Final   Report Status PENDING  Incomplete         Radiology Studies: No results found.      Scheduled Meds: . amiodarone  200 mg Oral Daily  . apixaban  2.5 mg Oral BID  . diltiazem  60 mg Oral Q6H  . doxycycline  100 mg Oral BID  . insulin aspart  0-5 Units Subcutaneous QHS  . insulin aspart  0-9 Units Subcutaneous TID WC  . iron polysaccharides  150 mg Oral Q breakfast  . rosuvastatin  5 mg Oral QHS  . sodium chloride flush  3 mL Intravenous Q12H  . sodium chloride flush  3 mL Intravenous Q12H   Continuous Infusions: . sodium chloride       LOS: 3 days    Tulsi Crossett Tanna Furry, MD [facility] Pager [phone]  If 7PM-7AM, please contact night-coverage [email or web address] Password [email or web address] 10/18/2016, 11:07 AM

## 2016-10-18 NOTE — Progress Notes (Signed)
Patient ambulated to [facility] and voided 300 cc. Pre and post void bladder scan completed. MD made aware.

## 2016-10-18 NOTE — Progress Notes (Signed)
Pt. With no urine output during the night.  Greater than 999cc of urine noted on bladder scan.  I/o of bladder attempted and unsuccessful. Pt. Able to void small amount when attempting. Post void residual still remains greater than 900. On coming RN made aware.

## 2016-10-18 NOTE — NC FL2 (Signed)
[place] LEVEL OF CARE SCREENING TOOL     IDENTIFICATION  Patient Name: Cynthia Wright Birthdate: [birth year] Sex: female Admission Date (Current Location): 10/14/2016  [facility] and [place] Number:  Herbalist and Address:  [facility], [place] [address] [place], [place] [address]      Provider Number: [id number]  Attending Physician Name and Address:  Rosita Fire, MD  Relative Name and Phone Number:       Current Level of Care: Hospital Recommended Level of Care: [place] Prior Approval Number:    Date Approved/Denied:   PASRR Number: [id number]  Discharge Plan: SNF    Current Diagnoses: Patient Active Problem List   Diagnosis Date Noted  . Healthcare-associated pneumonia   . Atrial fibrillation with RVR ([place]) 10/14/2016  . AF (paroxysmal atrial fibrillation) ([place])   . Dementia with behavioral disturbance   . Hypokalemia   . Postoperative anemia   . Hyperlipidemia   . E. coli UTI   . Closed displaced fracture of right femoral neck ([place]) 09/28/2016  . Fracture of left leg 09/27/2016  . Fall 07/12/2016  . Leukocytosis 07/12/2016  . Type 2 diabetes mellitus with hyperlipidemia ([place]) 07/12/2016  . History of atrial fibrillation 07/12/2016  . Acetabular fracture ([place]) 07/11/2016  . Dilated cardiomyopathy secondary to tachycardia ([place]) 12/14/2013  . Syncope 06/14/2013  . Bradycardia 05/11/2013  . Fatigue 05/11/2013  . Chronic diastolic CHF (congestive heart failure) ([place]) 01/06/2013  . Mitral regurgitation 08/06/2012  . Persistent atrial fibrillation ([place]) 08/02/2012  . Benign essential HTN 08/02/2012  . Pure hypercholesterolemia 08/02/2012  . Liver cyst 08/02/2012    Orientation RESPIRATION BLADDER Height & Weight     Self, Place  Normal Incontinent Weight: 134 lb (60.8 kg) Height:  5\' 5"  (165.1 cm)  BEHAVIORAL SYMPTOMS/MOOD NEUROLOGICAL BOWEL NUTRITION STATUS   (Impulsive)   (Dementia with behavioral disturbance) Incontinent Diet (Heart healthy/carb modified)  AMBULATORY STATUS COMMUNICATION OF NEEDS Skin   Limited Assist Verbally Skin abrasions, Bruising, Surgical wounds, Other (Comment) (Skin tear.)                       Personal Care Assistance Level of Assistance              Functional Limitations Info  Sight, Hearing, Speech Sight Info: Adequate Hearing Info: Adequate Speech Info: Adequate    SPECIAL CARE FACTORS FREQUENCY  PT (By licensed PT), Blood pressure     PT Frequency: 5 x week              Contractures Contractures Info: Not present    Additional Factors Info  Code Status, Allergies Code Status Info: Full Allergies Info: Codeine, Lortab (Hydrocodone-acetaminophen)           Current Medications (10/18/2016):  This is the current hospital active medication list Current Facility-Administered Medications  Medication Dose Route Frequency Provider Last Rate Last Dose  . 0.9 %  sodium chloride infusion  250 mL Intravenous PRN Cristal Ford, DO      . acetaminophen (TYLENOL) tablet 650 mg  650 mg Oral Q6H PRN Cristal Ford, DO       Or  . acetaminophen (TYLENOL) suppository 650 mg  650 mg Rectal Q6H PRN Cristal Ford, DO      . amiodarone (PACERONE) tablet 200 mg  200 mg Oral Daily Minus Breeding, MD   200 mg at 10/18/16 0912  . apixaban (ELIQUIS) tablet 2.5 mg  2.5 mg Oral BID Cristal Ford, DO   2.5 mg at 10/18/16 9629  . bisacodyl (DULCOLAX) suppository 10 mg  10 mg Rectal Daily PRN Eulogio Bear U, DO   10 mg at 10/16/16 1817  . diltiazem (CARDIZEM) tablet 60 mg  60 mg Oral Q6H Minus Breeding, MD   60 mg at 10/18/16 1251  . doxycycline (VIBRA-TABS) tablet 100 mg  100 mg Oral BID Eulogio Bear U, DO   100 mg at 10/18/16 0912  . insulin aspart (novoLOG) injection 0-5 Units  0-5 Units Subcutaneous QHS Mikhail, Maryann, DO      . insulin aspart (novoLOG) injection 0-9 Units  0-9 Units Subcutaneous TID WC  Mikhail, Velta [place], DO   1 Units at 10/18/16 1250  . iron polysaccharides (NIFEREX) capsule 150 mg  150 mg Oral Q breakfast Cristal Ford, DO   150 mg at 10/18/16 0744  . ondansetron (ZOFRAN) tablet 4 mg  4 mg Oral Q6H PRN Cristal Ford, DO       Or  . ondansetron [facility]) injection 4 mg  4 mg Intravenous Q6H PRN Cristal Ford, DO      . oxyCODONE (Oxy IR/ROXICODONE) immediate release tablet 5 mg  5 mg Oral Q4H PRN Cristal Ford, DO      . rosuvastatin (CRESTOR) tablet 5 mg  5 mg Oral QHS Mikhail, [place], DO   5 mg at 10/17/16 2221  . sodium chloride flush (NS) 0.9 % injection 3 mL  3 mL Intravenous Q12H Mikhail, [place], DO   3 mL at 10/18/16 1000  . sodium chloride flush (NS) 0.9 % injection 3 mL  3 mL Intravenous Q12H Mikhail, [place], DO   3 mL at 10/17/16 2223  . sodium chloride flush (NS) 0.9 % injection 3 mL  3 mL Intravenous PRN Cristal Ford, DO         Discharge Medications: Please see discharge summary for a list of discharge medications.  Relevant Imaging Results:  Relevant Lab Results:   Additional Information SS#: [id number]  Candie Chroman, LCSW

## 2016-10-18 NOTE — Progress Notes (Signed)
Patient to remain on home dose of amiodarone at discharge. No indication for reloading.  Lorilee Cafarella Curt Bears, MD 10/18/2016 11:33 AM

## 2016-10-18 NOTE — Clinical Social Work Note (Signed)
Sitter discontinued at 8:42 am this morning. Patient will need PT evaluation before she can return to SNF as she was only there for short-term rehab.  [place] Scrape, [place]

## 2016-10-18 NOTE — Evaluation (Signed)
Physical Therapy Evaluation Patient Details Name: Cynthia Wright MRN: [id number] DOB: 1924/09/03 Today's Date: 10/18/2016   History of Present Illness  Cynthia Wright a 83 y.o.femalewho presents with afib with RVR, CP and sepsis.  Pt was in Rehab for R anterior apporach hip hemiarthroplasty.  PMH: dementia, recent pelvic fx, PAF,  hypertension, dyslipidemia, chronic diastolic congestive heart failure, type 2 diabetes.   Clinical Impression  Pt admitted with above diagnosis. Pt currently with functional limitations due to the deficits listed below (see PT Problem List). Pt was able to ambulate with RW with min guard to min assist for safety.  Pt somewhat impulsive at times.  Brother states sonis looking into A living for pt.  Recommend SNF with transition to A living.  Will follow. HR 84-96bpm.   Pt will benefit from skilled PT to increase their independence and safety with mobility to allow discharge to the venue listed below.      Follow Up Recommendations SNF;Supervision/Assistance - 24 hour    Equipment Recommendations  None recommended by PT    Recommendations for Other Services       Precautions / Restrictions Precautions Precautions: Fall Precaution Comments: pt with severe dementia Restrictions Weight Bearing Restrictions: Yes RLE Weight Bearing: Weight bearing as tolerated      Mobility  Bed Mobility Overal bed mobility: Needs Assistance Bed Mobility: Supine to Sit     Supine to sit: Supervision        Transfers Overall transfer level: Needs assistance Equipment used: Rolling walker (2 wheeled) Transfers: Sit to/from Stand Sit to Stand: Min guard         General transfer comment: cues for hand placement and steadying assist intiially.   Ambulation/Gait Ambulation/Gait assistance: Min guard;Min assist;+2 safety/equipment Ambulation Distance (Feet): 150 Feet Assistive device: Rolling walker (2 wheeled) Gait Pattern/deviations: Step-to  pattern;Decreased step length - right;Decreased step length - left;Decreased dorsiflexion - right;Decreased dorsiflexion - left;Shuffle;Antalgic Gait velocity: decreased Gait velocity interpretation: Below normal speed for age/gender General Gait Details: Pt able to ambulate with RW with steadying assist occasionallyand cues to sequence steps and RW.  Needed cues to slow down at times.   Slightly impulsive at tmes.   Stairs            Wheelchair Mobility    Modified Rankin (Stroke Patients Only)       Balance Overall balance assessment: Needs assistance Sitting-balance support: Feet supported;No upper extremity supported Sitting balance-Leahy Scale: Fair     Standing balance support: Bilateral upper extremity supported;During functional activity Standing balance-Leahy Scale: Poor Standing balance comment: relies on RW for balance.                               Pertinent Vitals/Pain Pain Assessment: No/denies pain    Home Living Family/patient expects to be discharged to:: Private residence Living Arrangements: Alone Available Help at Discharge: Family;Available PRN/intermittently Type of Home: House Home Access: Level entry     Home Layout: One level Home Equipment: Wheelchair - manual;Bedside commode;Walker - 4 wheels;Cane - single point;Shower seat (bed rail on bed) Additional Comments: Son is looking at A living facilities    Prior Function Level of Independence: Needs assistance   Gait / Transfers Assistance Needed: min assist with RW   ADL's / Homemaking Assistance Needed: A with ADLS  Comments: Pt was doin gRehab at facility prior to this admit due to right hip fx.  Hand Dominance   Dominant Hand: Right    Extremity/Trunk Assessment   Upper Extremity Assessment Upper Extremity Assessment: Defer to OT evaluation    Lower Extremity Assessment Lower Extremity Assessment: Generalized weakness    Cervical / Trunk  Assessment Cervical / Trunk Assessment: Kyphotic  Communication   Communication: No difficulties  Cognition Arousal/Alertness: Awake/alert Behavior During Therapy: Flat affect Overall Cognitive Status: History of cognitive impairments - at baseline Area of Impairment: Attention;Memory;Problem solving;Following commands                   Current Attention Level: Sustained Memory: Decreased short-term memory Following Commands: Follows one step commands inconsistently;Follows one step commands with increased time     Problem Solving: Slow processing;Decreased initiation;Difficulty sequencing;Requires verbal cues General Comments: dementia, Plesent this morning but disoriented.      General Comments General comments (skin integrity, edema, etc.): Brother was present and provided a lot of info.     Exercises General Exercises - Lower Extremity Ankle Circles/Pumps: AROM;Both;10 reps;Supine Long Arc Quad: AROM;Both;10 reps;Seated   Assessment/Plan    PT Assessment Patient needs continued PT services  PT Problem List Decreased strength;Decreased range of motion;Decreased activity tolerance;Decreased balance;Decreased mobility;Decreased coordination;Decreased cognition;Decreased knowledge of use of DME;Decreased safety awareness;Decreased knowledge of precautions;Pain       PT Treatment Interventions DME instruction;Stair training;Gait training;Functional mobility training;Therapeutic activities;Therapeutic exercise;Balance training;Neuromuscular re-education    PT Goals (Current goals can be found in the Care Plan section)  Acute Rehab PT Goals Patient Stated Goal: didn't state PT Goal Formulation: With patient/family Time For Goal Achievement: 11/01/16 Potential to Achieve Goals: Good    Frequency Min 3X/week   Barriers to discharge Decreased caregiver support      Co-evaluation               AM-PAC PT "6 Clicks" Daily Activity  Outcome Measure Difficulty  turning over in bed (including adjusting bedclothes, sheets and blankets)?: None Difficulty moving from lying on back to sitting on the side of the bed? : None Difficulty sitting down on and standing up from a chair with arms (e.g., wheelchair, bedside commode, etc,.)?: A Little Help needed moving to and from a bed to chair (including a wheelchair)?: A Little Help needed walking in hospital room?: A Little Help needed climbing 3-5 steps with a railing? : A Lot 6 Click Score: 19    End of Session Equipment Utilized During Treatment: Gait belt Activity Tolerance: Patient tolerated treatment well Patient left: in chair;with call bell/phone within reach;with chair alarm set;with family/visitor present Nurse Communication: Mobility status PT Visit Diagnosis: Unsteadiness on feet (R26.81);History of falling (Z91.81);Muscle weakness (generalized) (M62.81)    Time: 4917-9150 PT Time Calculation (min) (ACUTE ONLY): 28 min   Charges:   PT Evaluation $PT Eval Moderate Complexity: 1 Procedure PT Treatments $Gait Training: 8-22 mins   PT G Codes:        Luara Faye,PT Acute Rehabilitation [phone] (pager)   Denice Paradise 10/18/2016, 1:55 PM

## 2016-10-19 DIAGNOSIS — F0281 Dementia in other diseases classified elsewhere with behavioral disturbance: Secondary | ICD-10-CM | POA: Diagnosis not present

## 2016-10-19 DIAGNOSIS — E119 Type 2 diabetes mellitus without complications: Secondary | ICD-10-CM | POA: Diagnosis not present

## 2016-10-19 DIAGNOSIS — G301 Alzheimer's disease with late onset: Secondary | ICD-10-CM | POA: Diagnosis not present

## 2016-10-19 DIAGNOSIS — R110 Nausea: Secondary | ICD-10-CM | POA: Diagnosis not present

## 2016-10-19 DIAGNOSIS — D508 Other iron deficiency anemias: Secondary | ICD-10-CM | POA: Diagnosis not present

## 2016-10-19 DIAGNOSIS — E785 Hyperlipidemia, unspecified: Secondary | ICD-10-CM | POA: Diagnosis not present

## 2016-10-19 DIAGNOSIS — S72001D Fracture of unspecified part of neck of right femur, subsequent encounter for closed fracture with routine healing: Secondary | ICD-10-CM | POA: Diagnosis not present

## 2016-10-19 DIAGNOSIS — R262 Difficulty in walking, not elsewhere classified: Secondary | ICD-10-CM | POA: Diagnosis not present

## 2016-10-19 DIAGNOSIS — I4891 Unspecified atrial fibrillation: Secondary | ICD-10-CM | POA: Diagnosis not present

## 2016-10-19 DIAGNOSIS — S728X9A Other fracture of unspecified femur, initial encounter for closed fracture: Secondary | ICD-10-CM | POA: Diagnosis not present

## 2016-10-19 DIAGNOSIS — M6281 Muscle weakness (generalized): Secondary | ICD-10-CM | POA: Diagnosis not present

## 2016-10-19 DIAGNOSIS — I10 Essential (primary) hypertension: Secondary | ICD-10-CM | POA: Diagnosis not present

## 2016-10-19 DIAGNOSIS — R41841 Cognitive communication deficit: Secondary | ICD-10-CM | POA: Diagnosis not present

## 2016-10-19 DIAGNOSIS — R4182 Altered mental status, unspecified: Secondary | ICD-10-CM | POA: Diagnosis not present

## 2016-10-19 DIAGNOSIS — J189 Pneumonia, unspecified organism: Secondary | ICD-10-CM | POA: Diagnosis not present

## 2016-10-19 DIAGNOSIS — R52 Pain, unspecified: Secondary | ICD-10-CM | POA: Diagnosis not present

## 2016-10-19 DIAGNOSIS — I5032 Chronic diastolic (congestive) heart failure: Secondary | ICD-10-CM | POA: Diagnosis not present

## 2016-10-19 DIAGNOSIS — K5900 Constipation, unspecified: Secondary | ICD-10-CM | POA: Diagnosis not present

## 2016-10-19 DIAGNOSIS — S72001A Fracture of unspecified part of neck of right femur, initial encounter for closed fracture: Secondary | ICD-10-CM | POA: Diagnosis not present

## 2016-10-19 DIAGNOSIS — R1030 Lower abdominal pain, unspecified: Secondary | ICD-10-CM | POA: Diagnosis not present

## 2016-10-19 DIAGNOSIS — R278 Other lack of coordination: Secondary | ICD-10-CM | POA: Diagnosis not present

## 2016-10-19 DIAGNOSIS — R109 Unspecified abdominal pain: Secondary | ICD-10-CM | POA: Diagnosis not present

## 2016-10-19 DIAGNOSIS — D649 Anemia, unspecified: Secondary | ICD-10-CM | POA: Diagnosis not present

## 2016-10-19 LAB — GLUCOSE, CAPILLARY
GLUCOSE-CAPILLARY: 129 mg/dL — AB (ref 65–99)
Glucose-Capillary: 148 mg/dL — ABNORMAL HIGH (ref 65–99)
Glucose-Capillary: 135 mg/dL — ABNORMAL HIGH (ref 65–99)

## 2016-10-19 LAB — CULTURE, BLOOD (ROUTINE X 2)
CULTURE: NO GROWTH
Culture: NO GROWTH
SPECIAL REQUESTS: ADEQUATE
Special Requests: ADEQUATE

## 2016-10-19 MED ORDER — DILTIAZEM HCL 60 MG PO TABS: 60.0000 mg | ORAL_TABLET | Freq: Four times a day (QID) | ORAL | 0 refills | Status: DC

## 2016-10-19 MED ORDER — AMIODARONE HCL 200 MG PO TABS: 200.0000 mg | ORAL_TABLET | Freq: Every day | ORAL | 0 refills | Status: AC

## 2016-10-19 NOTE — Progress Notes (Signed)
Pt has order to discharge to SNF. Family at bedside and aware. IVs removed/telemetry removed yesterday. Report called to [facility]. PTAR present at bedside. Patient stable.

## 2016-10-19 NOTE — Clinical Social Work Note (Signed)
Clinical Social Worker facilitated patient discharge including contacting patient family and facility to confirm patient discharge plans. Clinical information faxed to facility and family agreeable with plan. CSW arranged ambulance transport via [place] to [facility]. RN to call report to [phone] prior to discharge.  Clinical Social Worker will sign off for now as social work intervention is no longer needed. Please consult [place] again if new need arises.  Correen Bubolz B. Joline Maxcy Clinical Social Work Dept Weekend Social Worker [phone] 12:10 PM

## 2016-10-19 NOTE — Clinical Social Work Placement (Signed)
   CLINICAL SOCIAL WORK PLACEMENT  NOTE  Date:  10/19/2016  Patient Details  Name: SUPRIYA BEASTON MRN: [id number] Date of Birth: 03-19-1925  Clinical Social Work is seeking post-discharge placement for this patient at the [place] level of care (*CSW will initial, date and re-position this form in  chart as items are completed):  Yes   Patient/family provided with [place] Work Department's list of facilities offering this level of care within the geographic area requested by the patient (or if unable, by the patient's family).  Yes   Patient/family informed of their freedom to choose among providers that offer the needed level of care, that participate in Medicare, Medicaid or managed care program needed by the patient, have an available bed and are willing to accept the patient.  Yes   Patient/family informed of [facility]'s ownership interest in [facility] and [facility], as well as of the fact that they are under no obligation to receive care at these facilities.  PASRR submitted to EDS on       PASRR number received on 09/30/16     Existing PASRR number confirmed on 09/30/16     FL2 transmitted to all facilities in geographic area requested by pt/family on       FL2 transmitted to all facilities within larger geographic area on       Patient informed that his/her managed care company has contracts with or will negotiate with certain facilities, including the following:        Yes   Patient/family informed of bed offers received.  Patient chooses bed at  (Pt from [place])     Physician recommends and patient chooses bed at      Patient to be transferred to  (Pt from [place]) on 10/19/16.  Patient to be transferred to facility by  [facility]y Harold)     Patient family notified on 10/19/16 of transfer.  Name of family member notified:  Son and Dtr-in-Law     PHYSICIAN       Additional Comment:     _______________________________________________ Serafina Mitchell, [place] 10/19/2016, 12:14 PM

## 2016-10-19 NOTE — Discharge Summary (Addendum)
Physician Discharge Summary  Cynthia Wright ZOX:[id number] DOB: April 29, 1924 DOA: 10/14/2016  PCP: Aretta Nip, MD  Admit date: 10/14/2016 Discharge date: 10/19/2016  Admitted From:SNF Disposition:SNF  Recommendations for Outpatient Follow-up:  1. Follow up with PCP in 1-2 weeks 2. Please obtain BMP/CBC in one week   Home Health:SNF Equipment/Devices:none Discharge Condition:stable CODE STATUS:full code Diet recommendation:carb modified heart healthy  Brief/Interim Summary: 83 year old elderly female with history of paroxysmal atrial fibrillation on chronic anticoagulation, hypertension, dyslipidemia, chronic diastolic congestive heart failure, type 2 diabetes, dementia, recent hospitalization for right hip fracture admitted from rehabilitation facility for the evaluation of chest pain and marked dyspnea and found to have atrial fibrillation with RVR. Patient was also found to have atelectasis therefore started on antibiotics for presumed healthcare associated pneumonia/sepsis. clinically improved.   # A. fib with RVR:  -The medications adjusted by cardiologist. The dose of amiodarone increased to 200 mg and diltiazem to 60 mg. The heart rate improved. Patient is clinically improved. Discussed with the cardiologist today who recommended to discharge with the current medications. Patient will follow-up with her cardiologist as an outpatient. - Continue eliquis.  #Type 2 diabetes: Continue home medication and monitor blood sugar level. Follow up with PCP.  #Dyslipidemia: Continue statin.  #Iron deficiency anemia: Continue oral iron.  #Presumed healthcare associated pneumonia: Do not think patient has pneumonia. Discontinue doxycycline. Blood culture negative so far.  #Dementia with behavioral disturbance: Patient is calm and comfortable today. Discussed with the patient's brother at bedside. Patient will follow-up with PCP for the evaluation of dementia.  # Acute  urinary retention: Improved encourage ambulation  Discharge Diagnoses:  Active Problems:   Persistent atrial fibrillation (HCC)   Chronic diastolic CHF (congestive heart failure) (HCC)   Type 2 diabetes mellitus with hyperlipidemia (HCC)     Hyperlipidemia   Atrial fibrillation with RVR (HCC)   Healthcare-associated pneumonia, ruled out    Discharge Instructions  Discharge Instructions    (HEART FAILURE PATIENTS) Call MD:  Anytime you have any of the following symptoms: 1) 3 pound weight gain in 24 hours or 5 pounds in 1 week 2) shortness of breath, with or without a dry hacking cough 3) swelling in the hands, feet or stomach 4) if you have to sleep on extra pillows at night in order to breathe.    Complete by:  As directed    Call MD for:  difficulty breathing, headache or visual disturbances    Complete by:  As directed    Call MD for:  extreme fatigue    Complete by:  As directed    Call MD for:  hives    Complete by:  As directed    Call MD for:  persistant dizziness or light-headedness    Complete by:  As directed    Call MD for:  persistant nausea and vomiting    Complete by:  As directed    Call MD for:  severe uncontrolled pain    Complete by:  As directed    Call MD for:  temperature >100.4    Complete by:  As directed    Diet - low sodium heart healthy    Complete by:  As directed    Diet Carb Modified    Complete by:  As directed    Increase activity slowly    Complete by:  As directed      Allergies as of 10/19/2016      Reactions   Codeine Nausea And Vomiting  Lortab [hydrocodone-acetaminophen] Nausea And Vomiting      Medication List    STOP taking these medications   lisinopril 40 MG tablet Commonly known as:  PRINIVIL,ZESTRIL   risperiDONE 0.25 MG tablet Commonly known as:  RISPERDAL     TAKE these medications   amiodarone 200 MG tablet Commonly known as:  PACERONE Take 1 tablet (200 mg total) by mouth daily. What changed:  medication  strength  how much to take   CRESTOR 5 MG tablet Generic drug:  rosuvastatin Take 5 mg by mouth at bedtime.   diltiazem 60 MG tablet Commonly known as:  CARDIZEM Take 1 tablet (60 mg total) by mouth every 6 (six) hours. What changed:  medication strength  how much to take  when to take this   docusate sodium 100 MG capsule Commonly known as:  COLACE Take 200 mg by mouth daily.   ELIQUIS 2.5 MG Tabs tablet Generic drug:  apixaban TAKE 1 TABLET (2.5 MG TOTAL) BY MOUTH 2 (TWO) TIMES DAILY.   furosemide 20 MG tablet Commonly known as:  LASIX Take 20 mg by mouth daily as needed for edema.   iron polysaccharides 150 MG capsule Commonly known as:  NIFEREX Take 1 capsule (150 mg total) by mouth daily.   oxyCODONE 5 MG immediate release tablet Commonly known as:  Oxy IR/ROXICODONE Take 1-3 tablets (5-15 mg total) by mouth every 4 (four) hours as needed.   polyethylene glycol packet Commonly known as:  MIRALAX / GLYCOLAX Take 17 g by mouth daily.   potassium chloride SA 20 MEQ tablet Commonly known as:  KLOR-CON M20 Take 2 tablets (40 mEq total) by mouth every other day. What changed:  how much to take  when to take this   sitaGLIPtin 100 MG tablet Commonly known as:  JANUVIA Take 100 mg by mouth daily.      Follow-up Information    Rankins, Bill Salinas, MD. Schedule an appointment as soon as possible for a visit in 1 week(s).   Specialty:  Family Medicine Contact information: [place] [address] [phone]        Sueanne Margarita, MD. Schedule an appointment as soon as possible for a visit in 2 week(s).   Specialty:  Cardiology Contact information: [address]  [place] [address] [phone]          Allergies  Allergen Reactions  . Codeine Nausea And Vomiting  . Lortab [Hydrocodone-Acetaminophen] Nausea And Vomiting    Consultations: Cardiology  Procedures/Studies: None  Subjective: Seen and  examined at bedside. Reported feeling better. Denied headache, dizziness, nausea vomiting chest pain or shortness of breath. Patient's brother at bedside  Discharge Exam: Vitals:   10/18/16 2342 10/19/16 0428  BP: (!) 125/50 (!) 135/52  Pulse: 61 68  Resp:  18  Temp:  98.3 F (36.8 C)   Vitals:   10/18/16 1221 10/18/16 1911 10/18/16 2342 10/19/16 0428  BP: (!) 122/58 (!) 127/54 (!) 125/50 (!) 135/52  Pulse: 63 66 61 68  Resp: 18 18  18   Temp: 98.2 F (36.8 C) 97.7 F (36.5 C)  98.3 F (36.8 C)  TempSrc: Oral Oral  Oral  SpO2: 97% 98%  96%  Weight:    60.8 kg (134 lb)  Height:        General: Pt is alert, awake, not in acute distress Cardiovascular: RRR, S1/S2 +, no rubs, no gallops Respiratory: CTA bilaterally, no wheezing, no rhonchi Abdominal: Soft, NT, ND,  bowel sounds + Extremities: no edema, no cyanosis    The results of significant diagnostics from this hospitalization (including imaging, microbiology, ancillary and laboratory) are listed below for reference.     Microbiology: Recent Results (from the past 240 hour(s))  Blood culture (routine x 2)     Status: None (Preliminary result)   Collection Time: 10/14/16  4:10 PM  Result Value Ref Range Status   Specimen Description BLOOD LEFT ANTECUBITAL  Final   Special Requests   Final    BOTTLES DRAWN AEROBIC AND ANAEROBIC Blood Culture adequate volume   Culture NO GROWTH 4 DAYS  Final   Report Status PENDING  Incomplete  Blood culture (routine x 2)     Status: None (Preliminary result)   Collection Time: 10/14/16  4:18 PM  Result Value Ref Range Status   Specimen Description BLOOD RIGHT ANTECUBITAL  Final   Special Requests   Final    BOTTLES DRAWN AEROBIC AND ANAEROBIC Blood Culture adequate volume   Culture NO GROWTH 4 DAYS  Final   Report Status PENDING  Incomplete     Labs: BNP (last 3 results)  Recent Labs  10/14/16 1340  BNP 300.7*   Basic Metabolic Panel:  Recent Labs Lab 10/14/16 1345  10/14/16 1400 10/15/16 0425 10/16/16 0404 10/17/16 0403  NA 132* 133* 138 137 139  K 5.1 5.0 4.6 4.0 4.0  CL 98* 96* 104 103 104  CO2 27  --  26 27 26   GLUCOSE 211* 210* 167* 146* 149*  BUN 15 16 12 10 9   CREATININE 1.11* 1.00 0.97 0.88 0.91  CALCIUM 8.1*  --  8.3* 8.1* 8.3*  MG 2.5*  --   --   --   --    Liver Function Tests:  Recent Labs Lab 10/14/16 1345  AST 14*  ALT 11*  ALKPHOS 85  BILITOT 0.5  PROT 5.2*  ALBUMIN 2.5*   No results for input(s): LIPASE, AMYLASE in the last 168 hours. No results for input(s): AMMONIA in the last 168 hours. CBC:  Recent Labs Lab 10/14/16 1345 10/14/16 1400 10/15/16 0425 10/16/16 0404 10/17/16 0403  WBC 12.4*  --  9.2 8.8 7.7  NEUTROABS 10.8*  --   --   --   --   HGB 10.3* 9.9* 10.1* 9.5* 9.5*  HCT 32.7* 29.0* 31.8* 30.2* 30.4*  MCV 101.9*  --  102.6* 101.7* 102.0*  PLT 418*  --  379 336 315   Cardiac Enzymes: No results for input(s): CKTOTAL, CKMB, CKMBINDEX, TROPONINI in the last 168 hours. BNP: Invalid input(s): POCBNP CBG:  Recent Labs Lab 10/18/16 0736 10/18/16 1131 10/18/16 1639 10/19/16 0103 10/19/16 0726  GLUCAP 142* 148* 143* 129* 148*   D-Dimer No results for input(s): DDIMER in the last 72 hours. Hgb A1c No results for input(s): HGBA1C in the last 72 hours. Lipid Profile No results for input(s): CHOL, HDL, LDLCALC, TRIG, CHOLHDL, LDLDIRECT in the last 72 hours. Thyroid function studies No results for input(s): TSH, T4TOTAL, T3FREE, THYROIDAB in the last 72 hours.  Invalid input(s): FREET3 Anemia work up No results for input(s): VITAMINB12, FOLATE, FERRITIN, TIBC, IRON, RETICCTPCT in the last 72 hours. Urinalysis    Component Value Date/Time   COLORURINE YELLOW 10/14/2016 1510   APPEARANCEUR HAZY (A) 10/14/2016 1510   LABSPEC 1.013 10/14/2016 1510   PHURINE 5.0 10/14/2016 1510   GLUCOSEU NEGATIVE 10/14/2016 1510   HGBUR NEGATIVE 10/14/2016 1510   BILIRUBINUR NEGATIVE 10/14/2016 1510    KETONESUR NEGATIVE  10/14/2016 [place] 10/14/2016 1510   NITRITE NEGATIVE 10/14/2016 1510   LEUKOCYTESUR NEGATIVE 10/14/2016 1510   Sepsis Labs Invalid input(s): PROCALCITONIN,  WBC,  LACTICIDVEN Microbiology Recent Results (from the past 240 hour(s))  Blood culture (routine x 2)     Status: None (Preliminary result)   Collection Time: 10/14/16  4:10 PM  Result Value Ref Range Status   Specimen Description BLOOD LEFT ANTECUBITAL  Final   Special Requests   Final    BOTTLES DRAWN AEROBIC AND ANAEROBIC Blood Culture adequate volume   Culture NO GROWTH 4 DAYS  Final   Report Status PENDING  Incomplete  Blood culture (routine x 2)     Status: None (Preliminary result)   Collection Time: 10/14/16  4:18 PM  Result Value Ref Range Status   Specimen Description BLOOD RIGHT ANTECUBITAL  Final   Special Requests   Final    BOTTLES DRAWN AEROBIC AND ANAEROBIC Blood Culture adequate volume   Culture NO GROWTH 4 DAYS  Final   Report Status PENDING  Incomplete     Time coordinating discharge: 31 minutes  SIGNED:   Rosita Fire, MD  [facility] 10/19/2016, 10:39 AM  If 7PM-7AM, please contact night-coverage [email or web address] Password [email or web address]

## 2016-10-20 DIAGNOSIS — E119 Type 2 diabetes mellitus without complications: Secondary | ICD-10-CM | POA: Diagnosis not present

## 2016-10-20 DIAGNOSIS — I4891 Unspecified atrial fibrillation: Secondary | ICD-10-CM | POA: Diagnosis not present

## 2016-10-21 ENCOUNTER — Ambulatory Visit (INDEPENDENT_AMBULATORY_CARE_PROVIDER_SITE_OTHER): Payer: Medicare Other

## 2016-10-21 ENCOUNTER — Ambulatory Visit (INDEPENDENT_AMBULATORY_CARE_PROVIDER_SITE_OTHER): Payer: Medicare Other | Admitting: Orthopaedic Surgery

## 2016-10-21 DIAGNOSIS — S72001A Fracture of unspecified part of neck of right femur, initial encounter for closed fracture: Secondary | ICD-10-CM

## 2016-10-21 NOTE — Progress Notes (Signed)
Patient is 3 weeks status post right partial hip replacement for femoral neck fracture. She was readmitted for atrial fibrillation. She recently got out of the hospital and back into [facility]. On physical exam today her incision is healed. The staples were removed. X-ray show stable alignment of the partial hip replacement. Her range of motion is painless. Questions were encouraged and answered. Continue with aggressive physical therapy for mobilization and rehabilitation. Follow-up in 4 weeks with AP pelvis x-ray

## 2016-10-23 ENCOUNTER — Telehealth (INDEPENDENT_AMBULATORY_CARE_PROVIDER_SITE_OTHER): Payer: Self-pay | Admitting: Orthopaedic Surgery

## 2016-10-23 NOTE — Telephone Encounter (Signed)
[facility] faxed 10/23/16

## 2016-11-02 DIAGNOSIS — R1030 Lower abdominal pain, unspecified: Secondary | ICD-10-CM | POA: Diagnosis not present

## 2016-11-02 DIAGNOSIS — R110 Nausea: Secondary | ICD-10-CM | POA: Diagnosis not present

## 2016-11-05 ENCOUNTER — Ambulatory Visit: Payer: Medicare Other | Admitting: Cardiology

## 2016-11-07 DIAGNOSIS — E119 Type 2 diabetes mellitus without complications: Secondary | ICD-10-CM | POA: Diagnosis not present

## 2016-11-07 DIAGNOSIS — G301 Alzheimer's disease with late onset: Secondary | ICD-10-CM | POA: Diagnosis not present

## 2016-11-07 DIAGNOSIS — I4891 Unspecified atrial fibrillation: Secondary | ICD-10-CM | POA: Diagnosis not present

## 2016-11-07 DIAGNOSIS — D649 Anemia, unspecified: Secondary | ICD-10-CM | POA: Diagnosis not present

## 2016-11-18 ENCOUNTER — Encounter (INDEPENDENT_AMBULATORY_CARE_PROVIDER_SITE_OTHER): Payer: Self-pay | Admitting: Orthopaedic Surgery

## 2016-11-18 ENCOUNTER — Ambulatory Visit (INDEPENDENT_AMBULATORY_CARE_PROVIDER_SITE_OTHER): Payer: Medicare Other

## 2016-11-18 ENCOUNTER — Ambulatory Visit (INDEPENDENT_AMBULATORY_CARE_PROVIDER_SITE_OTHER): Payer: Medicare Other | Admitting: Orthopaedic Surgery

## 2016-11-18 DIAGNOSIS — S72001A Fracture of unspecified part of neck of right femur, initial encounter for closed fracture: Secondary | ICD-10-CM

## 2016-11-18 NOTE — Progress Notes (Signed)
Patient is 6 weeks status post right hemiarthroplasty. She is living at home now. Overall she is doing well. She complains of some hip pain with exertion that's muscular. Her surgical scars fully heal. She has good range of motion of her hip. She does have weakness and discomfort with hip flexion. X-ray show stable alignment of the partial hip replacement. Home health physical therapy prescription was given today. Follow-up in 2 months for recheck.

## 2016-11-26 ENCOUNTER — Encounter: Payer: Self-pay | Admitting: *Deleted

## 2016-12-03 DIAGNOSIS — L989 Disorder of the skin and subcutaneous tissue, unspecified: Secondary | ICD-10-CM | POA: Diagnosis not present

## 2016-12-03 DIAGNOSIS — Z23 Encounter for immunization: Secondary | ICD-10-CM | POA: Diagnosis not present

## 2016-12-03 DIAGNOSIS — I480 Paroxysmal atrial fibrillation: Secondary | ICD-10-CM | POA: Diagnosis not present

## 2016-12-03 DIAGNOSIS — E119 Type 2 diabetes mellitus without complications: Secondary | ICD-10-CM | POA: Diagnosis not present

## 2016-12-03 DIAGNOSIS — I10 Essential (primary) hypertension: Secondary | ICD-10-CM | POA: Diagnosis not present

## 2016-12-03 DIAGNOSIS — Z7984 Long term (current) use of oral hypoglycemic drugs: Secondary | ICD-10-CM | POA: Diagnosis not present

## 2016-12-03 DIAGNOSIS — F0390 Unspecified dementia without behavioral disturbance: Secondary | ICD-10-CM | POA: Diagnosis not present

## 2016-12-11 ENCOUNTER — Telehealth (INDEPENDENT_AMBULATORY_CARE_PROVIDER_SITE_OTHER): Payer: Self-pay | Admitting: *Deleted

## 2016-12-11 NOTE — Telephone Encounter (Signed)
Patient needs Rx (with medical code) for an RX for Diapers. Needs  medical reason & needs something dictated also.

## 2016-12-11 NOTE — Telephone Encounter (Signed)
REFAXED ORDER AGAIN 2ND REQUEST

## 2016-12-11 NOTE — Telephone Encounter (Signed)
Called Niece to advice

## 2016-12-11 NOTE — Telephone Encounter (Signed)
Pt niece called stating pt was suppose to have had In home Physical Therapy and has not heard from anyone. She stated that was here last with pt she was asked by assistant if she wanted the order to be faxed and someone was going to fax the order to [facility] that day. I do not see order for PT in workq. Was order faxed to [facility] and if so they have not made a visit to evaluate pt. Please advise.

## 2016-12-12 DIAGNOSIS — Z9181 History of falling: Secondary | ICD-10-CM | POA: Diagnosis not present

## 2016-12-12 DIAGNOSIS — I482 Chronic atrial fibrillation: Secondary | ICD-10-CM | POA: Diagnosis not present

## 2016-12-12 DIAGNOSIS — E785 Hyperlipidemia, unspecified: Secondary | ICD-10-CM | POA: Diagnosis not present

## 2016-12-12 DIAGNOSIS — Z7984 Long term (current) use of oral hypoglycemic drugs: Secondary | ICD-10-CM | POA: Diagnosis not present

## 2016-12-12 DIAGNOSIS — Z96641 Presence of right artificial hip joint: Secondary | ICD-10-CM | POA: Diagnosis not present

## 2016-12-12 DIAGNOSIS — F0390 Unspecified dementia without behavioral disturbance: Secondary | ICD-10-CM | POA: Diagnosis not present

## 2016-12-12 DIAGNOSIS — E119 Type 2 diabetes mellitus without complications: Secondary | ICD-10-CM | POA: Diagnosis not present

## 2016-12-12 DIAGNOSIS — S72001D Fracture of unspecified part of neck of right femur, subsequent encounter for closed fracture with routine healing: Secondary | ICD-10-CM | POA: Diagnosis not present

## 2016-12-12 DIAGNOSIS — R339 Retention of urine, unspecified: Secondary | ICD-10-CM | POA: Diagnosis not present

## 2016-12-12 DIAGNOSIS — I110 Hypertensive heart disease with heart failure: Secondary | ICD-10-CM | POA: Diagnosis not present

## 2016-12-12 DIAGNOSIS — D509 Iron deficiency anemia, unspecified: Secondary | ICD-10-CM | POA: Diagnosis not present

## 2016-12-12 DIAGNOSIS — Z7901 Long term (current) use of anticoagulants: Secondary | ICD-10-CM | POA: Diagnosis not present

## 2016-12-12 DIAGNOSIS — I5032 Chronic diastolic (congestive) heart failure: Secondary | ICD-10-CM | POA: Diagnosis not present

## 2016-12-12 NOTE — Telephone Encounter (Signed)
Rx diapers.  Dx is incontinence

## 2016-12-15 ENCOUNTER — Telehealth (INDEPENDENT_AMBULATORY_CARE_PROVIDER_SITE_OTHER): Payer: Self-pay

## 2016-12-15 NOTE — Telephone Encounter (Signed)
[place], Physical Therapist would like verbal orders for patient after Hip Replacement for 1 x week for 1 week and 2 x week for 4 weeks for patient.  Cb# is [phone].  Please advise.  Thank You.

## 2016-12-15 NOTE — Telephone Encounter (Signed)
Is this okay?

## 2016-12-15 NOTE — Telephone Encounter (Signed)
Tried to call niece no answer. Rx for Diapers with Dx Code ready for pick up unless she would like for me to fax somewhere. Will try again later.

## 2016-12-15 NOTE — Progress Notes (Signed)
Cardiology Office Note:    Date:  12/16/2016   ID:  Cynthia Wright, DOB Nov 29, 1924, MRN [id number]  PCP:  Cynthia Nip, MD  Cardiologist:  Cynthia Him, MD   Referring MD: Cynthia Nip, MD   Chief Complaint  Patient presents with  . Atrial Fibrillation  . Cardiomyopathy  . Congestive Heart Failure    History of Present Illness:    Cynthia Wright is a 83 y.o. 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 She also has had several episodes of syncope over the past year and neuro workup was negative. She was referred to EP for loop recorder but patient cancelled first appt with Dr. Lovena Le and then never showed up for second appt due to a family illness. She was hospitalized 10/14/2016 from rehab.  She had suffered a right hip fx and was in rehab and had CP and SOB and was found to be in afib with RVR.  She was dx with HCAP and treated with antbx.  She was started on Amio 200mg  daily for her afib as well as cardizem 60mg  q6 hours.    She is here today for followup and is doing well.  She denies any chest pain or pressure, SOB, DOE, PND, orthopnea, LE edema,  palpitations or syncope.  She occasionally will have some dizziness when sitting.  Her niece is with her today and would like to try to get her on a once daily dose of Cardizem.    Past Medical History:  Diagnosis Date  . Atrial fibrillation ([place])    s/p DCCV 07/2012  . Benign diastolic hypertension   . Chronic anticoagulation   . Chronic diastolic CHF (congestive heart failure) (HCC)    EF now 65% by echo 10/2012 with grade II diasotlid dysfunction  . Diabetes mellitus without complication ([place])   . Dilated cardiomyopathy secondary to tachycardia ([place]) 07/2012   2D echo 10/2012 showed EF 65% with grade II diastolic dysfunction  . Elevated cholesterol    . Hyperlipidemia   . Hypertension   . Liver cyst   . Memory loss     Past Surgical History:  Procedure Laterality Date  . ANTERIOR APPROACH HEMI HIP ARTHROPLASTY Right 09/28/2016   Procedure: ANTERIOR APPROACH HEMI HIP ARTHROPLASTY;  Surgeon: Leandrew Koyanagi, MD;  Location: [facility];  Service: Orthopedics;  Laterality: Right;  . APPENDECTOMY    . BACK SURGERY     herniated disc  . CARDIOVERSION N/A 08/09/2012   Procedure: CARDIOVERSION;  Surgeon: Sueanne Margarita, MD;  Location: [facility];  Service: Cardiovascular;  Laterality: N/A;  . CARDIOVERSION N/A 09/28/2014   Procedure: CARDIOVERSION;  Surgeon: Sueanne Margarita, MD;  Location: [facility] ENDOS[facility];  Service: Cardiovascular;  Laterality: N/A;  . CHOLECYSTECTOMY    . TEE WITHOUT CARDIOVERSION N/A 08/09/2012   Procedure: TRANSESOPHAGEAL ECHOCARDIOGRAM (TEE);  Surgeon: Sueanne Margarita, MD;  Location: [facility] ENDOSCOPY;  Service: Cardiovascular;  Laterality: N/A;  talk to angie in [place].  . TONSILLECTOMY      Current Medications: Current Meds  Medication Sig  . amiodarone (PACERONE) 200 MG tablet Take 1 tablet (200 mg total) by mouth daily.  [name] Kitchen docusate sodium (COLACE) 100 MG capsule Take 200 mg by mouth daily.  [name] Kitchen ELIQUIS 2.5 MG TABS tablet TAKE 1 TABLET (2.5 MG TOTAL) BY MOUTH 2 (TWO) TIMES DAILY.  [name] Kitchen  furosemide (LASIX) 20 MG tablet Take 20 mg by mouth daily.   . iron polysaccharides (NIFEREX) 150 MG capsule Take 1 capsule (150 mg total) by mouth daily.  [name] Kitchen oxyCODONE (OXY IR/ROXICODONE) 5 MG immediate release tablet Take 1-3 tablets (5-15 mg total) by mouth every 4 (four) hours as needed.  . polyethylene glycol (MIRALAX / GLYCOLAX) packet Take 17 g by mouth daily.  . potassium chloride SA (KLOR-CON M20) 20 MEQ tablet Take 2 tablets (40 mEq total) by mouth every other day. (Patient taking differently: Take 20 mEq by mouth 2 (two) times daily. )  . rosuvastatin (CRESTOR) 5 MG tablet Take 5 mg by mouth at bedtime.   . sitaGLIPtin (JANUVIA) 100 MG tablet  Take 100 mg by mouth daily.  . [DISCONTINUED] diltiazem (CARDIZEM) 60 MG tablet Take 1 tablet (60 mg total) by mouth every 6 (six) hours.     Allergies:   Codeine and Lortab [hydrocodone-acetaminophen]   Social History   Social History  . Marital status: Single    Spouse name: N/A  . Number of children: N/A  . Years of education: N/A   Social History Main Topics  . Smoking status: Former Research scientist (life sciences)  . Smokeless tobacco: Never Used  . Alcohol use 0.0 oz/week    1 - 2 Glasses of wine per week  . Drug use: No  . Sexual activity: Not Asked   Other Topics Concern  . None   Social History Narrative   Divorced, 3 children   Right handed   Some college   2 cups daily      Family History: The patient's family history includes CAD in her father and mother; Heart disease in her father and mother; Pancreatic cancer in her brother.  ROS:   Please see the history of present illness.     All other systems reviewed and are negative.  EKGs/Labs/Other Studies Reviewed:    The following studies were reviewed today: Hospital notes  EKG:  EKG is  ordered today and showed NSR with PACs, septal infarct  Recent Labs: 07/12/2016: TSH 3.414 10/14/2016: ALT 11; B Natriuretic Peptide 238.8; Magnesium 2.5 10/17/2016: BUN 9; Creatinine, Ser 0.91; Hemoglobin 9.5; Platelets 315; Potassium 4.0; Sodium 139   Recent Lipid Panel No results found for: CHOL, TRIG, HDL, CHOLHDL, VLDL, LDLCALC, LDLDIRECT  Physical Exam:    VS:  BP (!) 178/72   Pulse 85   Ht 5\' 5"  (1.651 m)   Wt 121 lb 12.8 oz (55.2 kg)   BMI 20.27 kg/m     Wt Readings from Last 3 Encounters:  12/16/16 121 lb 12.8 oz (55.2 kg)  10/19/16 134 lb (60.8 kg)  09/29/16 132 lb 4.4 oz (60 kg)     GEN:  Well nourished, well developed in no acute distress HEENT: Normal NECK: No JVD; No carotid bruits LYMPHATICS: No lymphadenopathy CARDIAC: RRR, no murmurs, rubs, gallops RESPIRATORY:  Clear to auscultation without rales, wheezing or  rhonchi  ABDOMEN: Soft, non-tender, non-distended MUSCULOSKELETAL:  No edema; No deformity  SKIN: Warm and dry NEUROLOGIC:  Alert and oriented x 3 PSYCHIATRIC:  Normal affect   ASSESSMENT:    1. Persistent atrial fibrillation ([place])   2. Benign essential HTN   3. Chronic diastolic CHF (congestive heart failure) ([place])   4. Syncope, unspecified syncope type   5. Dilated cardiomyopathy secondary to tachycardia ([place])    PLAN:    In order of problems listed above:  1. Persistent atrial fibrillation- she recently had a reoccurrence of  afib in the setting of HCAP and was loaded with Amio. She is maintaining NSR on Amio 200mg  daily. She will continue on Eliquis 2.5mg  BID (age > 14 and < 60kg).  She denies any bleeding problems with is compliant with her meds. She will continue Amio.  Per her Niece's request, will change short acting Cardizem to Cardizem CD 240mg  daily for easier dosing.  I will repeat TSH and LFTs. QTc stable on EKG today at 457msec.  2. HTN - her BP is elevated on exam today. I will change her Cardizem short acting to Cardizem CD 240mg  daily and followup in HTN clinic in 1 week.   3. Chronic diastolic CHF - she appears euvolemic on exam today. She will continue on Lasix 20mg  daily.  4. Syncope - she has not had any reoccurrence of syncope.   5. Dilated CM secondary to tachycardia - LVF has normalized on echo 2015 EF 60-65%.   Medication Adjustments/Labs and Tests Ordered: Current medicines are reviewed at length with the patient today.  Concerns regarding medicines are outlined above.  Orders Placed This Encounter  Procedures  . EKG 12-Lead   Meds ordered this encounter  Medications  . diltiazem (CARDIZEM CD) 240 MG 24 hr capsule    Sig: Take 1 capsule (240 mg total) by mouth daily.    Dispense:  30 capsule    Refill:  6    Signed, Cynthia Him, MD  12/16/2016 1:33 PM    [facility]

## 2016-12-16 ENCOUNTER — Ambulatory Visit (INDEPENDENT_AMBULATORY_CARE_PROVIDER_SITE_OTHER): Payer: Medicare Other | Admitting: Cardiology

## 2016-12-16 ENCOUNTER — Encounter: Payer: Self-pay | Admitting: Cardiology

## 2016-12-16 VITALS — BP 178/72 | HR 85 | Ht 65.0 in | Wt 121.8 lb

## 2016-12-16 DIAGNOSIS — I10 Essential (primary) hypertension: Secondary | ICD-10-CM

## 2016-12-16 DIAGNOSIS — R000 Tachycardia, unspecified: Secondary | ICD-10-CM

## 2016-12-16 DIAGNOSIS — I481 Persistent atrial fibrillation: Secondary | ICD-10-CM | POA: Diagnosis not present

## 2016-12-16 DIAGNOSIS — I43 Cardiomyopathy in diseases classified elsewhere: Secondary | ICD-10-CM

## 2016-12-16 DIAGNOSIS — R55 Syncope and collapse: Secondary | ICD-10-CM | POA: Diagnosis not present

## 2016-12-16 DIAGNOSIS — I4819 Other persistent atrial fibrillation: Secondary | ICD-10-CM

## 2016-12-16 DIAGNOSIS — I5032 Chronic diastolic (congestive) heart failure: Secondary | ICD-10-CM

## 2016-12-16 MED ORDER — DILTIAZEM HCL ER COATED BEADS 240 MG PO CP24: 240.0000 mg | ORAL_CAPSULE | Freq: Every day | ORAL | 6 refills | Status: AC

## 2016-12-16 NOTE — Telephone Encounter (Signed)
Called to advise.  

## 2016-12-16 NOTE — Patient Instructions (Addendum)
Stop Cardizem 60 mg   Start Cardizem CD 240 mg daily   Lab work today ( tsh,hepatic panel )   Schedule Hypertension Clinic appointment in 1 week    Wear compression stockings during daytime    Your physician recommends that you schedule a follow-up appointment in 3 months with Dr.Turner

## 2016-12-16 NOTE — Addendum Note (Signed)
Addended by: Velna Ochs on: 12/16/2016 01:51 PM   Modules accepted: Orders

## 2016-12-16 NOTE — Telephone Encounter (Signed)
yes

## 2016-12-17 ENCOUNTER — Telehealth: Payer: Self-pay

## 2016-12-17 DIAGNOSIS — I110 Hypertensive heart disease with heart failure: Secondary | ICD-10-CM | POA: Diagnosis not present

## 2016-12-17 DIAGNOSIS — I482 Chronic atrial fibrillation: Secondary | ICD-10-CM | POA: Diagnosis not present

## 2016-12-17 DIAGNOSIS — S72001D Fracture of unspecified part of neck of right femur, subsequent encounter for closed fracture with routine healing: Secondary | ICD-10-CM | POA: Diagnosis not present

## 2016-12-17 DIAGNOSIS — F0390 Unspecified dementia without behavioral disturbance: Secondary | ICD-10-CM | POA: Diagnosis not present

## 2016-12-17 DIAGNOSIS — E119 Type 2 diabetes mellitus without complications: Secondary | ICD-10-CM | POA: Diagnosis not present

## 2016-12-17 DIAGNOSIS — I5032 Chronic diastolic (congestive) heart failure: Secondary | ICD-10-CM | POA: Diagnosis not present

## 2016-12-17 LAB — HEPATIC FUNCTION PANEL
ALT: 21 IU/L (ref 0–32)
AST: 25 IU/L (ref 0–40)
Albumin: 4 g/dL (ref 3.5–4.7)
Alkaline Phosphatase: 177 IU/L — ABNORMAL HIGH (ref 39–117)
BILIRUBIN TOTAL: 0.3 mg/dL (ref 0.0–1.2)
BILIRUBIN, DIRECT: 0.11 mg/dL (ref 0.00–0.40)
TOTAL PROTEIN: 6.5 g/dL (ref 6.0–8.5)

## 2016-12-17 LAB — TSH: TSH: 5.06 u[IU]/mL — ABNORMAL HIGH (ref 0.450–4.500)

## 2016-12-17 NOTE — Telephone Encounter (Signed)
-----  Message from Sueanne Margarita, MD sent at 12/17/2016  9:44 AM EDT ----- Increased Alk phos and TSH - forward to PCP for further recommendations

## 2016-12-19 DIAGNOSIS — F0390 Unspecified dementia without behavioral disturbance: Secondary | ICD-10-CM | POA: Diagnosis not present

## 2016-12-19 DIAGNOSIS — E119 Type 2 diabetes mellitus without complications: Secondary | ICD-10-CM | POA: Diagnosis not present

## 2016-12-19 DIAGNOSIS — S72001D Fracture of unspecified part of neck of right femur, subsequent encounter for closed fracture with routine healing: Secondary | ICD-10-CM | POA: Diagnosis not present

## 2016-12-19 DIAGNOSIS — R5383 Other fatigue: Secondary | ICD-10-CM | POA: Diagnosis not present

## 2016-12-19 DIAGNOSIS — I110 Hypertensive heart disease with heart failure: Secondary | ICD-10-CM | POA: Diagnosis not present

## 2016-12-19 DIAGNOSIS — I10 Essential (primary) hypertension: Secondary | ICD-10-CM | POA: Diagnosis not present

## 2016-12-19 DIAGNOSIS — I5032 Chronic diastolic (congestive) heart failure: Secondary | ICD-10-CM | POA: Diagnosis not present

## 2016-12-19 DIAGNOSIS — I482 Chronic atrial fibrillation: Secondary | ICD-10-CM | POA: Diagnosis not present

## 2016-12-19 DIAGNOSIS — N390 Urinary tract infection, site not specified: Secondary | ICD-10-CM | POA: Diagnosis not present

## 2016-12-22 ENCOUNTER — Encounter (HOSPITAL_COMMUNITY): Payer: Self-pay | Admitting: Emergency Medicine

## 2016-12-22 ENCOUNTER — Inpatient Hospital Stay (HOSPITAL_COMMUNITY)
Admission: EM | Admit: 2016-12-22 | Discharge: 2016-12-26 | DRG: 291 | Disposition: A | Discharge status: Home Health | Payer: Medicare Other | Attending: Internal Medicine | Admitting: Internal Medicine

## 2016-12-22 ENCOUNTER — Emergency Department (HOSPITAL_COMMUNITY): Payer: Medicare Other

## 2016-12-22 DIAGNOSIS — R0902 Hypoxemia: Secondary | ICD-10-CM | POA: Diagnosis not present

## 2016-12-22 DIAGNOSIS — Z7901 Long term (current) use of anticoagulants: Secondary | ICD-10-CM

## 2016-12-22 DIAGNOSIS — R9431 Abnormal electrocardiogram [ECG] [EKG]: Secondary | ICD-10-CM | POA: Diagnosis not present

## 2016-12-22 DIAGNOSIS — F0390 Unspecified dementia without behavioral disturbance: Secondary | ICD-10-CM | POA: Diagnosis present

## 2016-12-22 DIAGNOSIS — I700 Atherosclerosis of aorta: Secondary | ICD-10-CM | POA: Diagnosis not present

## 2016-12-22 DIAGNOSIS — I110 Hypertensive heart disease with heart failure: Principal | ICD-10-CM | POA: Diagnosis present

## 2016-12-22 DIAGNOSIS — Z791 Long term (current) use of non-steroidal anti-inflammatories (NSAID): Secondary | ICD-10-CM | POA: Diagnosis not present

## 2016-12-22 DIAGNOSIS — I5032 Chronic diastolic (congestive) heart failure: Secondary | ICD-10-CM | POA: Diagnosis present

## 2016-12-22 DIAGNOSIS — I420 Dilated cardiomyopathy: Secondary | ICD-10-CM | POA: Diagnosis present

## 2016-12-22 DIAGNOSIS — Z885 Allergy status to narcotic agent status: Secondary | ICD-10-CM | POA: Diagnosis not present

## 2016-12-22 DIAGNOSIS — I480 Paroxysmal atrial fibrillation: Secondary | ICD-10-CM | POA: Diagnosis present

## 2016-12-22 DIAGNOSIS — E1165 Type 2 diabetes mellitus with hyperglycemia: Secondary | ICD-10-CM | POA: Diagnosis present

## 2016-12-22 DIAGNOSIS — R945 Abnormal results of liver function studies: Secondary | ICD-10-CM

## 2016-12-22 DIAGNOSIS — Z79899 Other long term (current) drug therapy: Secondary | ICD-10-CM | POA: Diagnosis not present

## 2016-12-22 DIAGNOSIS — E871 Hypo-osmolality and hyponatremia: Secondary | ICD-10-CM | POA: Diagnosis present

## 2016-12-22 DIAGNOSIS — R05 Cough: Secondary | ICD-10-CM | POA: Diagnosis not present

## 2016-12-22 DIAGNOSIS — E872 Acidosis: Secondary | ICD-10-CM | POA: Diagnosis not present

## 2016-12-22 DIAGNOSIS — I5033 Acute on chronic diastolic (congestive) heart failure: Secondary | ICD-10-CM | POA: Diagnosis present

## 2016-12-22 DIAGNOSIS — N390 Urinary tract infection, site not specified: Secondary | ICD-10-CM | POA: Diagnosis not present

## 2016-12-22 DIAGNOSIS — K7689 Other specified diseases of liver: Secondary | ICD-10-CM | POA: Diagnosis present

## 2016-12-22 DIAGNOSIS — J9690 Respiratory failure, unspecified, unspecified whether with hypoxia or hypercapnia: Secondary | ICD-10-CM | POA: Diagnosis not present

## 2016-12-22 DIAGNOSIS — R7989 Other specified abnormal findings of blood chemistry: Secondary | ICD-10-CM | POA: Diagnosis present

## 2016-12-22 DIAGNOSIS — E785 Hyperlipidemia, unspecified: Secondary | ICD-10-CM | POA: Diagnosis not present

## 2016-12-22 DIAGNOSIS — Z96641 Presence of right artificial hip joint: Secondary | ICD-10-CM | POA: Diagnosis present

## 2016-12-22 DIAGNOSIS — F03918 Unspecified dementia, unspecified severity, with other behavioral disturbance: Secondary | ICD-10-CM | POA: Diagnosis present

## 2016-12-22 DIAGNOSIS — Z87891 Personal history of nicotine dependence: Secondary | ICD-10-CM | POA: Diagnosis not present

## 2016-12-22 DIAGNOSIS — J189 Pneumonia, unspecified organism: Secondary | ICD-10-CM

## 2016-12-22 DIAGNOSIS — Q620 Congenital hydronephrosis: Secondary | ICD-10-CM

## 2016-12-22 DIAGNOSIS — F0391 Unspecified dementia with behavioral disturbance: Secondary | ICD-10-CM | POA: Diagnosis present

## 2016-12-22 DIAGNOSIS — J9601 Acute respiratory failure with hypoxia: Secondary | ICD-10-CM | POA: Diagnosis not present

## 2016-12-22 DIAGNOSIS — J181 Lobar pneumonia, unspecified organism: Secondary | ICD-10-CM

## 2016-12-22 DIAGNOSIS — J90 Pleural effusion, not elsewhere classified: Secondary | ICD-10-CM | POA: Diagnosis not present

## 2016-12-22 DIAGNOSIS — I340 Nonrheumatic mitral (valve) insufficiency: Secondary | ICD-10-CM | POA: Diagnosis present

## 2016-12-22 DIAGNOSIS — E1169 Type 2 diabetes mellitus with other specified complication: Secondary | ICD-10-CM | POA: Diagnosis not present

## 2016-12-22 DIAGNOSIS — N1330 Unspecified hydronephrosis: Secondary | ICD-10-CM | POA: Diagnosis not present

## 2016-12-22 DIAGNOSIS — S72001D Fracture of unspecified part of neck of right femur, subsequent encounter for closed fracture with routine healing: Secondary | ICD-10-CM | POA: Diagnosis not present

## 2016-12-22 DIAGNOSIS — E119 Type 2 diabetes mellitus without complications: Secondary | ICD-10-CM | POA: Diagnosis not present

## 2016-12-22 DIAGNOSIS — I482 Chronic atrial fibrillation: Secondary | ICD-10-CM | POA: Diagnosis not present

## 2016-12-22 DIAGNOSIS — R030 Elevated blood-pressure reading, without diagnosis of hypertension: Secondary | ICD-10-CM | POA: Diagnosis not present

## 2016-12-22 DIAGNOSIS — R0603 Acute respiratory distress: Secondary | ICD-10-CM

## 2016-12-22 DIAGNOSIS — I10 Essential (primary) hypertension: Secondary | ICD-10-CM | POA: Diagnosis present

## 2016-12-22 LAB — COMPREHENSIVE METABOLIC PANEL
ALT: 149 U/L — AB (ref 14–54)
AST: 145 U/L — AB (ref 15–41)
Albumin: 3 g/dL — ABNORMAL LOW (ref 3.5–5.0)
Alkaline Phosphatase: 297 U/L — ABNORMAL HIGH (ref 38–126)
Anion gap: 9 (ref 5–15)
BUN: 24 mg/dL — AB (ref 6–20)
CHLORIDE: 101 mmol/L (ref 101–111)
CO2: 24 mmol/L (ref 22–32)
CREATININE: 1.03 mg/dL — AB (ref 0.44–1.00)
Calcium: 8.3 mg/dL — ABNORMAL LOW (ref 8.9–10.3)
GFR calc Af Amer: 54 mL/min — ABNORMAL LOW (ref >60)
GFR, EST NON AFRICAN AMERICAN: 47 mL/min — AB (ref >60)
Glucose, Bld: 209 mg/dL — ABNORMAL HIGH (ref 65–99)
Potassium: 4 mmol/L (ref 3.5–5.1)
Sodium: 134 mmol/L — ABNORMAL LOW (ref 135–145)
Total Bilirubin: 0.6 mg/dL (ref 0.3–1.2)
Total Protein: 6.5 g/dL (ref 6.5–8.1)

## 2016-12-22 LAB — URINALYSIS, ROUTINE W REFLEX MICROSCOPIC
Bacteria, UA: NONE SEEN
Bilirubin Urine: NEGATIVE
Glucose, UA: NEGATIVE mg/dL
Hgb urine dipstick: NEGATIVE
Ketones, ur: NEGATIVE mg/dL
Nitrite: NEGATIVE
Protein, ur: 30 mg/dL — AB
SPECIFIC GRAVITY, URINE: 1.017 (ref 1.005–1.030)
pH: 5 (ref 5.0–8.0)

## 2016-12-22 LAB — CBC WITH DIFFERENTIAL/PLATELET
Basophils Absolute: 0 10*3/uL (ref 0.0–0.1)
Basophils Relative: 0 %
EOS ABS: 0.1 10*3/uL (ref 0.0–0.7)
EOS PCT: 1 %
HCT: 36 % (ref 36.0–46.0)
Hemoglobin: 11.9 g/dL — ABNORMAL LOW (ref 12.0–15.0)
LYMPHS ABS: 0.1 10*3/uL — AB (ref 0.7–4.0)
Lymphocytes Relative: 2 %
MCH: 32.8 pg (ref 26.0–34.0)
MCHC: 33.1 g/dL (ref 30.0–36.0)
MCV: 99.2 fL (ref 78.0–100.0)
MONO ABS: 0.3 10*3/uL (ref 0.1–1.0)
MONOS PCT: 3 %
Neutro Abs: 8.8 10*3/uL — ABNORMAL HIGH (ref 1.7–7.7)
Neutrophils Relative %: 94 %
PLATELETS: 210 10*3/uL (ref 150–400)
RBC: 3.63 MIL/uL — AB (ref 3.87–5.11)
RDW: 14.7 % (ref 11.5–15.5)
WBC: 9.4 10*3/uL (ref 4.0–10.5)

## 2016-12-22 LAB — GLUCOSE, CAPILLARY: GLUCOSE-CAPILLARY: 172 mg/dL — AB (ref 65–99)

## 2016-12-22 LAB — I-STAT CG4 LACTIC ACID, ED: LACTIC ACID, VENOUS: 1.02 mmol/L (ref 0.5–1.9)

## 2016-12-22 LAB — TROPONIN I

## 2016-12-22 MED ORDER — SODIUM CHLORIDE 0.9% FLUSH
3.0000 mL | INTRAVENOUS | Status: DC | PRN
  Administered 2016-12-23 – 2016-12-24 (×2): 3 mL via INTRAVENOUS
  Filled 2016-12-22 (×2): qty 3

## 2016-12-22 MED ORDER — SODIUM CHLORIDE 0.9 % IV BOLUS (SEPSIS): 1000.0000 mL | Freq: Once | INTRAVENOUS | Status: DC

## 2016-12-22 MED ORDER — AZITHROMYCIN 250 MG PO TABS
500.0000 mg | ORAL_TABLET | Freq: Once | ORAL | Status: AC
  Administered 2016-12-22: 500 mg via ORAL
  Filled 2016-12-22: qty 2

## 2016-12-22 MED ORDER — SODIUM CHLORIDE 0.9 % IV BOLUS (SEPSIS)
1000.0000 mL | Freq: Once | INTRAVENOUS | Status: AC
  Administered 2016-12-22: 1000 mL via INTRAVENOUS

## 2016-12-22 MED ORDER — ACETAMINOPHEN 325 MG PO TABS: 650.0000 mg | ORAL_TABLET | Freq: Four times a day (QID) | ORAL | Status: DC | PRN

## 2016-12-22 MED ORDER — POLYETHYLENE GLYCOL 3350 17 G PO PACK
17.0000 g | PACK | Freq: Every day | ORAL | Status: DC
  Administered 2016-12-23 – 2016-12-26 (×4): 17 g via ORAL
  Filled 2016-12-22 (×4): qty 1

## 2016-12-22 MED ORDER — DEXTROSE 5 % IV SOLN
1.0000 g | Freq: Once | INTRAVENOUS | Status: AC
  Administered 2016-12-22: 1 g via INTRAVENOUS
  Filled 2016-12-22: qty 10

## 2016-12-22 MED ORDER — INSULIN ASPART 100 UNIT/ML ~~LOC~~ SOLN
0.0000 [IU] | Freq: Three times a day (TID) | SUBCUTANEOUS | Status: DC
  Administered 2016-12-23 – 2016-12-24 (×3): 3 [IU] via SUBCUTANEOUS
  Administered 2016-12-25: 2 [IU] via SUBCUTANEOUS
  Administered 2016-12-25 – 2016-12-26 (×3): 3 [IU] via SUBCUTANEOUS

## 2016-12-22 MED ORDER — ONDANSETRON HCL 4 MG/2ML IJ SOLN: 4.0000 mg | Freq: Four times a day (QID) | INTRAMUSCULAR | Status: DC | PRN

## 2016-12-22 MED ORDER — POTASSIUM CHLORIDE CRYS ER 20 MEQ PO TBCR
20.0000 meq | EXTENDED_RELEASE_TABLET | Freq: Every day | ORAL | Status: DC
  Administered 2016-12-23 – 2016-12-26 (×4): 20 meq via ORAL
  Filled 2016-12-22 (×4): qty 1

## 2016-12-22 MED ORDER — INSULIN ASPART 100 UNIT/ML ~~LOC~~ SOLN: 0.0000 [IU] | Freq: Every day | SUBCUTANEOUS | Status: DC

## 2016-12-22 MED ORDER — MELATONIN 10 MG PO TABS: 30.0000 mg | ORAL_TABLET | Freq: Every day | ORAL | Status: DC

## 2016-12-22 MED ORDER — ROSUVASTATIN CALCIUM 5 MG PO TABS: 5.0000 mg | ORAL_TABLET | Freq: Every day | ORAL | Status: DC

## 2016-12-22 MED ORDER — ZOLPIDEM TARTRATE 5 MG PO TABS
5.0000 mg | ORAL_TABLET | Freq: Every day | ORAL | Status: DC
  Administered 2016-12-22 – 2016-12-24 (×2): 5 mg via ORAL
  Filled 2016-12-22 (×2): qty 1

## 2016-12-22 MED ORDER — DEXTROSE 5 % IV SOLN
1.0000 g | Freq: Two times a day (BID) | INTRAVENOUS | Status: DC
  Administered 2016-12-22 – 2016-12-23 (×2): 1 g via INTRAVENOUS
  Filled 2016-12-22 (×2): qty 1

## 2016-12-22 MED ORDER — SODIUM CHLORIDE 0.9% FLUSH
3.0000 mL | Freq: Two times a day (BID) | INTRAVENOUS | Status: DC
  Administered 2016-12-22 – 2016-12-25 (×6): 3 mL via INTRAVENOUS

## 2016-12-22 MED ORDER — POLYSACCHARIDE IRON COMPLEX 150 MG PO CAPS
150.0000 mg | ORAL_CAPSULE | Freq: Every day | ORAL | Status: DC
  Administered 2016-12-23 – 2016-12-26 (×4): 150 mg via ORAL
  Filled 2016-12-22 (×4): qty 1

## 2016-12-22 MED ORDER — AMIODARONE HCL 200 MG PO TABS
200.0000 mg | ORAL_TABLET | Freq: Every day | ORAL | Status: DC
  Administered 2016-12-23 – 2016-12-26 (×4): 200 mg via ORAL
  Filled 2016-12-22 (×4): qty 1

## 2016-12-22 MED ORDER — VANCOMYCIN HCL IN DEXTROSE 750-5 MG/150ML-% IV SOLN
750.0000 mg | Freq: Once | INTRAVENOUS | Status: AC
  Administered 2016-12-23: 750 mg via INTRAVENOUS
  Filled 2016-12-22: qty 150

## 2016-12-22 MED ORDER — HYDROCODONE-ACETAMINOPHEN 5-325 MG PO TABS: 1.0000 | ORAL_TABLET | Freq: Four times a day (QID) | ORAL | Status: DC | PRN

## 2016-12-22 MED ORDER — DOCUSATE SODIUM 100 MG PO CAPS
200.0000 mg | ORAL_CAPSULE | Freq: Every day | ORAL | Status: DC
  Administered 2016-12-22 – 2016-12-25 (×4): 200 mg via ORAL
  Filled 2016-12-22 (×4): qty 2

## 2016-12-22 MED ORDER — FUROSEMIDE 20 MG PO TABS
20.0000 mg | ORAL_TABLET | Freq: Every day | ORAL | Status: DC
  Administered 2016-12-23 – 2016-12-24 (×2): 20 mg via ORAL
  Filled 2016-12-22 (×2): qty 1

## 2016-12-22 MED ORDER — SODIUM CHLORIDE 0.9 % IV SOLN: 250.0000 mL | INTRAVENOUS | Status: DC | PRN

## 2016-12-22 MED ORDER — DILTIAZEM HCL ER COATED BEADS 240 MG PO CP24
240.0000 mg | ORAL_CAPSULE | Freq: Every day | ORAL | Status: DC
  Administered 2016-12-23 – 2016-12-26 (×4): 240 mg via ORAL
  Filled 2016-12-22 (×4): qty 1

## 2016-12-22 MED ORDER — APIXABAN 2.5 MG PO TABS
2.5000 mg | ORAL_TABLET | Freq: Two times a day (BID) | ORAL | Status: DC
  Administered 2016-12-22 – 2016-12-26 (×8): 2.5 mg via ORAL
  Filled 2016-12-22 (×8): qty 1

## 2016-12-22 NOTE — ED Notes (Signed)
Pt cannot use restroom at this time, aware urine specimen is needed.  

## 2016-12-22 NOTE — ED Notes (Signed)
While ambulating in hall PT 02 drop between 85-93% HR=106 RR=30. PT family member state when unable to lay flat at home

## 2016-12-22 NOTE — ED Triage Notes (Signed)
Per [facility] pt from home called out for re eval of UTI. Family states pt has began antibiotics, 3 pills missing from bottle.

## 2016-12-22 NOTE — ED Notes (Signed)
Bed: WA15 Expected date:  Expected time:  Means of arrival:  Comments: EMS-[facility] 

## 2016-12-22 NOTE — ED Provider Notes (Signed)
[facility] DEPT Provider Note   CSN: [id number] Arrival date & time: 12/22/16  [place]     History   Chief Complaint Chief Complaint  Patient presents with  . Urinary Tract Infection    HPI Cynthia Wright is a 83 y.o. female.  Pt presents to the ED today with a possible UTI.  The pt said that she has had an UTI and has been on abx (bactrim).  She has not been improving, so family called EMS.  Pt said that she does not feel well.  Pt's niece said pt has not had an appetite, she has not been acting her normal self.      Past Medical History:  Diagnosis Date  . Atrial fibrillation ([place])    s/p DCCV 07/2012  . Benign diastolic hypertension   . Chronic anticoagulation   . Chronic diastolic CHF (congestive heart failure) (HCC)    EF now 65% by echo 10/2012 with grade II diasotlid dysfunction  . Diabetes mellitus without complication ([place])   . Dilated cardiomyopathy secondary to tachycardia ([place]) 07/2012   2D echo 10/2012 showed EF 65% with grade II diastolic dysfunction  . Elevated cholesterol   . Hyperlipidemia   . Hypertension   . Liver cyst   . Memory loss     Patient Active Problem List   Diagnosis Date Noted  . HCAP (healthcare-associated pneumonia)   . Dementia with behavioral disturbance   . Hypokalemia   . Postoperative anemia   . Hyperlipidemia   . E. coli UTI   . Closed displaced fracture of right femoral neck ([place]) 09/28/2016  . Fracture of left leg 09/27/2016  . Fall 07/12/2016  . Leukocytosis 07/12/2016  . Type 2 diabetes mellitus with hyperlipidemia ([place]) 07/12/2016  . Acetabular fracture ([place]) 07/11/2016  . Dilated cardiomyopathy secondary to tachycardia ([place]) 12/14/2013  . Syncope 06/14/2013  . Bradycardia 05/11/2013  . Fatigue 05/11/2013  . Chronic diastolic CHF (congestive heart failure) ([place]) 01/06/2013  . Mitral regurgitation 08/06/2012  . PAF (paroxysmal atrial fibrillation) ([place]) 08/02/2012  . Benign essential HTN 08/02/2012  . Pure  hypercholesterolemia 08/02/2012  . Liver cyst 08/02/2012    Past Surgical History:  Procedure Laterality Date  . ANTERIOR APPROACH HEMI HIP ARTHROPLASTY Right 09/28/2016   Procedure: ANTERIOR APPROACH HEMI HIP ARTHROPLASTY;  Surgeon: Leandrew Koyanagi, MD;  Location: [facility];  Service: Orthopedics;  Laterality: Right;  . APPENDECTOMY    . BACK SURGERY     herniated disc  . CARDIOVERSION N/A 08/09/2012   Procedure: CARDIOVERSION;  Surgeon: Sueanne Margarita, MD;  Location: [facility];  Service: Cardiovascular;  Laterality: N/A;  . CARDIOVERSION N/A 09/28/2014   Procedure: CARDIOVERSION;  Surgeon: Sueanne Margarita, MD;  [facility];  Service: Cardiovascular;  Laterality: N/A;  . CHOLECYSTECTOMY    . TEE WITHOUT CARDIOVERSION N/A 08/09/2012   Procedure: TRANSESOPHAGEAL ECHOCARDIOGRAM (TEE);  Surgeon: Sueanne Margarita, MD;  Location[facility] ENDOSCOPY;  Service: Cardiovascular;  Laterality: N/A;  talk to angie in [place].  . TONSILLECTOMY      OB History    No data available       Home Medications    Prior to Admission medications   Medication Sig Start Date End Date Taking? Authorizing Provider  amiodarone (PACERONE) 200 MG tablet Take 1 tablet (200 mg total) by mouth daily. 10/20/16  Yes Rosita Fire, MD  diltiazem (CARDIZEM CD) 240 MG 24 hr capsule Take 1 capsule (240 mg total) by mouth daily. 12/16/16 03/16/17 Yes  Sueanne Margarita, MD  docusate sodium (COLACE) 100 MG capsule Take 200 mg by mouth at bedtime.    Yes [provider]  ELIQUIS 2.5 MG TABS tablet TAKE 1 TABLET (2.5 MG TOTAL) BY MOUTH 2 (TWO) TIMES DAILY. 04/21/16  Yes Turner, Eber Hong, MD  furosemide (LASIX) 20 MG tablet Take 20 mg by mouth daily.    Yes [provider]  ibuprofen (ADVIL,MOTRIN) 200 MG tablet Take 200 mg by mouth 2 (two) times daily.   Yes [provider]  iron polysaccharides (NIFEREX) 150 MG capsule Take 1 capsule (150 mg total) by mouth daily. 09/30/16  Yes Barton Dubois, MD    Melatonin 10 MG TABS Take 30 mg by mouth at bedtime.   Yes [provider]  polyethylene glycol (MIRALAX / GLYCOLAX) packet Take 17 g by mouth daily.   Yes [provider]  potassium chloride SA (KLOR-CON M20) 20 MEQ tablet Take 2 tablets (40 mEq total) by mouth every other day. Patient taking differently: Take 20 mEq by mouth 2 (two) times daily.  05/23/16  Yes Turner, Eber Hong, MD  rosuvastatin (CRESTOR) 5 MG tablet Take 5 mg by mouth at bedtime.    Yes [provider]  sitaGLIPtin (JANUVIA) 100 MG tablet Take 100 mg by mouth daily.   Yes [provider]  sulfamethoxazole-trimethoprim (BACTRIM DS,SEPTRA DS) 800-160 MG tablet Take 1 tablet by mouth 2 (two) times daily.   Yes [provider]  oxyCODONE (OXY IR/ROXICODONE) 5 MG immediate release tablet Take 1-3 tablets (5-15 mg total) by mouth every 4 (four) hours as needed. Patient not taking: Reported on 12/22/2016 09/28/16   Leandrew Koyanagi, MD    Family History Family History  Problem Relation Age of Onset  . Heart disease Mother   . CAD Mother   . Heart disease Father   . CAD Father   . Pancreatic cancer Brother     Social History Social History  Substance Use Topics  . Smoking status: Former Research scientist (life sciences)  . Smokeless tobacco: Never Used  . Alcohol use 0.0 oz/week    1 - 2 Glasses of wine per week     Allergies   Codeine and Lortab [hydrocodone-acetaminophen]   Review of Systems Review of Systems  Constitutional: Positive for fatigue.  All other systems reviewed and are negative.    Physical Exam Updated Vital Signs BP (!) 155/62   Pulse 79   Temp 97.9 F (36.6 C) (Oral)   Resp (!) 22   Ht 5\' 5"  (1.651 m)   Wt 54.9 kg (121 lb)   SpO2 93%   BMI 20.14 kg/m   Physical Exam  Constitutional: She is oriented to person, place, and time. She appears well-developed and well-nourished.  HENT:  Head: Normocephalic and atraumatic.  Right Ear: External ear normal.  Left Ear:  External ear normal.  Nose: Nose normal.  Mouth/Throat: Oropharynx is clear and moist.  Eyes: Pupils are equal, round, and reactive to light. Conjunctivae and EOM are normal.  Neck: Normal range of motion. Neck supple.  Cardiovascular: Normal rate, regular rhythm, normal heart sounds and intact distal pulses.   Pulmonary/Chest: Effort normal. She has rhonchi.  Abdominal: Soft. Bowel sounds are normal.  Musculoskeletal: Normal range of motion.  Neurological: She is alert and oriented to person, place, and time.  Skin: Skin is warm. Capillary refill takes less than 2 seconds.  Psychiatric: She has a normal mood and affect. Her behavior is normal. Judgment and thought content  normal.  Nursing note and vitals reviewed.    ED Treatments / Results  Labs (all labs ordered are listed, but only abnormal results are displayed) Labs Reviewed  COMPREHENSIVE METABOLIC PANEL - Abnormal; Notable for the following:       Result Value   Sodium 134 (*)    Glucose, Bld 209 (*)    BUN 24 (*)    Creatinine, Ser 1.03 (*)    Calcium 8.3 (*)    Albumin 3.0 (*)    AST 145 (*)    ALT 149 (*)    Alkaline Phosphatase 297 (*)    GFR calc non Af Amer 47 (*)    GFR calc Af Amer 54 (*)    All other components within normal limits  CBC WITH DIFFERENTIAL/PLATELET - Abnormal; Notable for the following:    RBC 3.63 (*)    Hemoglobin 11.9 (*)    Neutro Abs 8.8 (*)    Lymphs Abs 0.1 (*)    All other components within normal limits  URINALYSIS, ROUTINE W REFLEX MICROSCOPIC - Abnormal; Notable for the following:    APPearance HAZY (*)    Protein, ur 30 (*)    Leukocytes, UA TRACE (*)    Squamous Epithelial / LPF 0-5 (*)    All other components within normal limits  CULTURE, BLOOD (ROUTINE X 2)  CULTURE, BLOOD (ROUTINE X 2)  TROPONIN I  I-STAT CG4 LACTIC ACID, ED  I-STAT CG4 LACTIC ACID, ED    EKG  EKG Interpretation  Date/Time:  Monday December 22 2016 17:40:29 EDT Ventricular Rate:  73 PR  Interval:    QRS Duration: 98 QT Interval:  426 QTC Calculation: 470 R Axis:   78 Text Interpretation:  Sinus rhythm Probable anterolateral infarct, age indeterm Confirmed by Isla Pence [phone]) on 12/22/2016 5:45:38 PM       Radiology Dg Chest 2 View  Result Date: 12/22/2016 CLINICAL DATA:  Cough x3 days with fever. EXAM: CHEST  2 VIEW COMPARISON:  10/14/2016 CXR FINDINGS: The heart size and mediastinal contours are within normal limits. Mild-to-moderate atherosclerosis of the aortic arch. No aneurysm. Small to moderate layering pleural effusions left greater than right. Patchy atelectasis and/or pneumonia are believed to account for patchy airspace opacities at each lung base. 6 x 3 mm ovoid nodular appearing density in the right upper lobe may represent a small pulmonary nodule or vascular shadow. This was not apparent on the recent comparison study. IMPRESSION: 1. Left greater than right small to moderate pleural effusions. 2. Patchy airspace opacities at the lung bases suspicious for atelectasis and/or pneumonia. 3. Aortic atherosclerosis. Electronically Signed   By: Ashley Royalty M.D.   On: 12/22/2016 18:17    Procedures Procedures (including critical care time)  Medications Ordered in ED Medications  azithromycin (ZITHROMAX) tablet 500 mg (not administered)  sodium chloride 0.9 % bolus 1,000 mL (not administered)  sodium chloride 0.9 % bolus 1,000 mL (1,000 mLs Intravenous New Bag/Given 12/22/16 1726)  cefTRIAXone (ROCEPHIN) 1 g in dextrose 5 % 50 mL IVPB (1 g Intravenous New Bag/Given 12/22/16 2130)     Initial Impression / Assessment and Plan / ED Course  I have reviewed the triage vital signs and the nursing notes.  Pertinent labs & imaging results that were available during my care of the patient were reviewed by me and considered in my medical decision making (see chart for details).    When ambulating, O2 sat drops from 85-93% on RA.  Pt is not  on home O2.  She was d/w  Dr. Myna Hidalgo (triad) for admission.  Final Clinical Impressions(s) / ED Diagnoses   Final diagnoses:  Pneumonia of both lower lobes due to infectious organism [facility])  Hypoxia    New Prescriptions New Prescriptions   No medications on file     Isla Pence, MD 12/22/16 2211

## 2016-12-22 NOTE — H&P (Signed)
History and Physical    Cynthia Wright:[id number] DOB: 1924/12/28 DOA: 12/22/2016  PCP: Aretta Nip, MD   Patient coming from: Home  Chief Complaint: Cough, confusion, anorexia, fatigue    HPI: Cynthia Wright is a 83 y.o. female with medical history significant for dementia, atrial fibrillation on  Eliquis, chronic diastolic CHF,  type 2 diabetes mellitus, and hypertension, now presenting to the emergency department for evaluation of productive cough, anorexia, increased confusion, and fatigue. Patient is accompanied by family who assist with the history. She had reportedly been increasingly confused over the past several days, saw her PCP for this, was diagnosed with UTI, and started on Bactrim. She has also developed a cough over the past 3 days which worsened significantly today. She has been dyspneic with minimal exertion. She reports some associated nausea with couple episodes of vomiting. Denies fevers. Denies chest pain. Reports that her chronic bilateral lower extremity edema appears stable. She reports having orthopnea over the past few days.   ED Course: Upon arrival to the ED, patient is found to be afebrile, saturating adequately on room air while at rest, tachypneic, and with vitals otherwise stable. EKG features a sinus rhythm with nonspecific ST abnormality in the anterolateral leads. Chest x-ray is notable for small bilateral pleural effusions and patchy airspace opacities in the bases concerning for pneumonia versus atelectasis. Chemistry panel reveals a mild hyponatremia, stable creatinine of 1.03, glucose 209, and elevation in LFTs with alkaline phosphatase 297, AST 145, and ALT 149. LFTs were recently normal. CBC is unremarkable. Troponin is undetectable. Lactic acid is reassuring at 1.02. Blood cultures were collected and the patient was treated with 1 L of normal saline and empiric Rocephin and azithromycin. She remained hemodynamically stable and in no acute  distress while at rest, but desaturates to the mid 80s with minimal exertion, and will be admitted to the medical surgical unit for ongoing evaluation and management of HCAP.   Review of Systems:  All other systems reviewed and apart from HPI, are negative.  Past Medical History:  Diagnosis Date  . Atrial fibrillation ([place])    s/p DCCV 07/2012  . Benign diastolic hypertension   . Chronic anticoagulation   . Chronic diastolic CHF (congestive heart failure) (HCC)    EF now 65% by echo 10/2012 with grade II diasotlid dysfunction  . Diabetes mellitus without complication ([place])   . Dilated cardiomyopathy secondary to tachycardia ([place]) 07/2012   2D echo 10/2012 showed EF 65% with grade II diastolic dysfunction  . Elevated cholesterol   . Hyperlipidemia   . Hypertension   . Liver cyst   . Memory loss     Past Surgical History:  Procedure Laterality Date  . ANTERIOR APPROACH HEMI HIP ARTHROPLASTY Right 09/28/2016   Procedure: ANTERIOR APPROACH HEMI HIP ARTHROPLASTY;  Surgeon: Leandrew Koyanagi, MD;  Location: [facility];  Service: Orthopedics;  Laterality: Right;  . APPENDECTOMY    . BACK SURGERY     herniated disc  . CARDIOVERSION N/A 08/09/2012   Procedure: CARDIOVERSION;  Surgeon: Sueanne Margarita, MD;  Location: [place];  Service: Cardiovascular;  Laterality: N/A;  . CARDIOVERSION N/A 09/28/2014   Procedure: CARDIOVERSION;  Surgeon: Sueanne Margarita, MD;  Location: [facility] ENDOSCOPY;  Service: Cardiovascular;  Laterality: N/A;  . CHOLECYSTECTOMY    . TEE WITHOUT CARDIOVERSION N/A 08/09/2012   Procedure: TRANSESOPHAGEAL ECHOCARDIOGRAM (TEE);  Surgeon: Sueanne Margarita, MD;  Location[facility] ENDOSCOPY;  Service: Cardiovascular;  Laterality: N/A;  talk to  angie in [place].  . TONSILLECTOMY       reports that she has quit smoking. She has never used smokeless tobacco. She reports that she drinks alcohol. She reports that she does not use drugs.  Allergies  Allergen Reactions  . Codeine Nausea And Vomiting  .  Lortab [Hydrocodone-Acetaminophen] Nausea And Vomiting    Family History  Problem Relation Age of Onset  . Heart disease Mother   . CAD Mother   . Heart disease Father   . CAD Father   . Pancreatic cancer Brother      Prior to Admission medications   Medication Sig Start Date End Date Taking? Authorizing Provider  amiodarone (PACERONE) 200 MG tablet Take 1 tablet (200 mg total) by mouth daily. 10/20/16  Yes Rosita Fire, MD  diltiazem (CARDIZEM CD) 240 MG 24 hr capsule Take 1 capsule (240 mg total) by mouth daily. 12/16/16 03/16/17 Yes Turner, Eber Hong, MD  docusate sodium (COLACE) 100 MG capsule Take 200 mg by mouth at bedtime.    Yes [provider]  ELIQUIS 2.5 MG TABS tablet TAKE 1 TABLET (2.5 MG TOTAL) BY MOUTH 2 (TWO) TIMES DAILY. 04/21/16  Yes Turner, Eber Hong, MD  furosemide (LASIX) 20 MG tablet Take 20 mg by mouth daily.    Yes [provider]  ibuprofen (ADVIL,MOTRIN) 200 MG tablet Take 200 mg by mouth 2 (two) times daily.   Yes [provider]  iron polysaccharides (NIFEREX) 150 MG capsule Take 1 capsule (150 mg total) by mouth daily. 09/30/16  Yes Barton Dubois, MD  Melatonin 10 MG TABS Take 30 mg by mouth at bedtime.   Yes [provider]  polyethylene glycol (MIRALAX / GLYCOLAX) packet Take 17 g by mouth daily.   Yes [provider]  potassium chloride SA (KLOR-CON M20) 20 MEQ tablet Take 2 tablets (40 mEq total) by mouth every other day. Patient taking differently: Take 20 mEq by mouth 2 (two) times daily.  05/23/16  Yes Turner, Eber Hong, MD  rosuvastatin (CRESTOR) 5 MG tablet Take 5 mg by mouth at bedtime.    Yes [provider]  sitaGLIPtin (JANUVIA) 100 MG tablet Take 100 mg by mouth daily.   Yes [provider]  sulfamethoxazole-trimethoprim (BACTRIM DS,SEPTRA DS) 800-160 MG tablet Take 1 tablet by mouth 2 (two) times daily.   Yes [provider]    Physical Exam: Vitals:   12/22/16 1702  12/22/16 2044  BP: (!) 155/60 (!) 155/62  Pulse: 74 79  Resp: 16 (!) 22  Temp: 97.9 F (36.6 C)   TempSrc: Oral   SpO2: 94% 93%  Weight: 54.9 kg (121 lb)   Height: 5\' 5"  (1.651 m)       Constitutional: No pallor, no diaphoresis, calm Eyes: PERTLA, lids and conjunctivae normal ENMT: Mucous membranes are moist. Posterior pharynx clear of any exudate or lesions.   Neck: normal, supple, no masses, no thyromegaly Respiratory: Diminished breath sounds b/l with rhonchi at right base. Mild tachypnea. No accessory muscle use.  Cardiovascular: S1 & S2 heard, regular rate and rhythm. 1+ pretibial edema bilaterally. No significant JVD. Abdomen: No distension, no tenderness, no masses palpated. Bowel sounds normal.  Musculoskeletal: no clubbing / cyanosis. No joint deformity upper and lower extremities.    Skin: hyperpigmentation of bilateral lower legs in gaiter distribution. Warm, dry, well-perfused. Neurologic: CN 2-12 grossly intact. Sensation intact. Strength 5/5 in all 4 limbs.  Psychiatric: Alert and oriented to person, place, and situation. Labile  emotions, mild confusion.      Labs on Admission: I have personally reviewed following labs and imaging studies  CBC:  Recent Labs Lab 12/22/16 1717  WBC 9.4  NEUTROABS 8.8*  HGB 11.9*  HCT 36.0  MCV 99.2  PLT 462   Basic Metabolic Panel:  Recent Labs Lab 12/22/16 1717  NA 134*  K 4.0  CL 101  CO2 24  GLUCOSE 209*  BUN 24*  CREATININE 1.03*  CALCIUM 8.3*   GFR: Estimated Creatinine Clearance: 32.1 mL/min (A) (by C-G formula based on SCr of 1.03 mg/dL (H)). Liver Function Tests:  Recent Labs Lab 12/16/16 1351 12/22/16 1717  AST 25 145*  ALT 21 149*  ALKPHOS 177* 297*  BILITOT 0.3 0.6  PROT 6.5 6.5  ALBUMIN 4.0 3.0*   No results for input(s): LIPASE, AMYLASE in the last 168 hours. No results for input(s): AMMONIA in the last 168 hours. Coagulation Profile: No results for input(s): INR, PROTIME in the last  168 hours. Cardiac Enzymes:  Recent Labs Lab 12/22/16 1717  TROPONINI <0.03   BNP (last 3 results) No results for input(s): PROBNP in the last 8760 hours. HbA1C: No results for input(s): HGBA1C in the last 72 hours. CBG: No results for input(s): GLUCAP in the last 168 hours. Lipid Profile: No results for input(s): CHOL, HDL, LDLCALC, TRIG, CHOLHDL, LDLDIRECT in the last 72 hours. Thyroid Function Tests: No results for input(s): TSH, T4TOTAL, FREET4, T3FREE, THYROIDAB in the last 72 hours. Anemia Panel: No results for input(s): VITAMINB12, FOLATE, FERRITIN, TIBC, IRON, RETICCTPCT in the last 72 hours. Urine analysis:    Component Value Date/Time   COLORURINE YELLOW 12/22/2016 2036   APPEARANCEUR HAZY (A) 12/22/2016 2036   LABSPEC 1.017 12/22/2016 2036   PHURINE 5.0 12/22/2016 2036   GLUCOSEU NEGATIVE 12/22/2016 2036   HGBUR NEGATIVE 12/22/2016 2036   BILIRUBINUR NEGATIVE 12/22/2016 2036   [place] NEGATIVE 12/22/2016 2036   PROTEINUR 30 (A) 12/22/2016 2036   NITRITE NEGATIVE 12/22/2016 2036   LEUKOCYTESUR TRACE (A) 12/22/2016 2036   Sepsis Labs: @LABRCNTIP (procalcitonin:4,lacticidven:4) )No results found for this or any previous visit (from the past 240 hour(s)).   Radiological Exams on Admission: Dg Chest 2 View  Result Date: 12/22/2016 CLINICAL DATA:  Cough x3 days with fever. EXAM: CHEST  2 VIEW COMPARISON:  10/14/2016 CXR FINDINGS: The heart size and mediastinal contours are within normal limits. Mild-to-moderate atherosclerosis of the aortic arch. No aneurysm. Small to moderate layering pleural effusions left greater than right. Patchy atelectasis and/or pneumonia are believed to account for patchy airspace opacities at each lung base. 6 x 3 mm ovoid nodular appearing density in the right upper lobe may represent a small pulmonary nodule or vascular shadow. This was not apparent on the recent comparison study. IMPRESSION: 1. Left greater than right small to moderate  pleural effusions. 2. Patchy airspace opacities at the lung bases suspicious for atelectasis and/or pneumonia. 3. Aortic atherosclerosis. Electronically Signed   By: Ashley Royalty M.D.   On: 12/22/2016 18:17    EKG: Independently reviewed. Sinus rhythm, non-specific ST-abnormality in anterolateral leads.   Assessment/Plan  1. HCAP  - Pt presents with increased confusion, lethargy, and productive cough, and is found to desaturate with minimal exertion and have suspected basilar PNA on CXR  - Blood cultures were collected in ED, will add sputum culture and check strep pneumo antigen  - Continue empiric abx with vancomycin and cefepime - Continue supportive care with prn supplemental O2    2.  Elevated LFT's  - Transaminases elevated to 150-range, recently normal  - Pt denies abd pain and abd exam benign; she does report vomiting  - Check RUQ [place] and repeat CMP in am    3. Paroxysmal atrial fibrillation  - In a sinus rhythm on admission  - Continue amiodarone and diltiazem  - CHADS-VASc is 74 (age x2, gender, HTN, DM, CHF) - Continue Eliquis   4. Chronic diastolic CHF  - Pt appears roughly euvolemic on admission, reports recent orthopnea, states leg swelling is at baseline  - Treated in ED with 1 liter NS  - Plan to SLIV, follow daily wts and I/O's, continue Lasix 20 mg qD    5. Type II DM  - A1c was 6.1% in July '18  - Managed at home with Januvia, held on admission  - Check CBG with meals and qHS, start a low-intensity SSI with Novolog    DVT prophylaxis: Eliquis Code Status: Full  Family Communication: Niece updated at bedside  Disposition Plan: Admit to med-surg Consults called: None Admission status: Inpatient    Vianne Bulls, MD [facility] Pager [phone]  If 7PM-7AM, please contact night-coverage [email or web address] Password [facility]  12/22/2016, 10:18 PM

## 2016-12-23 ENCOUNTER — Inpatient Hospital Stay (HOSPITAL_COMMUNITY): Payer: Medicare Other

## 2016-12-23 DIAGNOSIS — E1169 Type 2 diabetes mellitus with other specified complication: Secondary | ICD-10-CM

## 2016-12-23 DIAGNOSIS — E785 Hyperlipidemia, unspecified: Secondary | ICD-10-CM

## 2016-12-23 LAB — COMPREHENSIVE METABOLIC PANEL
ALT: 142 U/L — ABNORMAL HIGH (ref 14–54)
AST: 130 U/L — ABNORMAL HIGH (ref 15–41)
Albumin: 2.7 g/dL — ABNORMAL LOW (ref 3.5–5.0)
Alkaline Phosphatase: 257 U/L — ABNORMAL HIGH (ref 38–126)
Anion gap: 10 (ref 5–15)
BILIRUBIN TOTAL: 0.7 mg/dL (ref 0.3–1.2)
BUN: 21 mg/dL — ABNORMAL HIGH (ref 6–20)
CHLORIDE: 102 mmol/L (ref 101–111)
CO2: 22 mmol/L (ref 22–32)
CREATININE: 0.97 mg/dL (ref 0.44–1.00)
Calcium: 8.1 mg/dL — ABNORMAL LOW (ref 8.9–10.3)
GFR, EST AFRICAN AMERICAN: 58 mL/min — AB (ref >60)
GFR, EST NON AFRICAN AMERICAN: 50 mL/min — AB (ref >60)
Glucose, Bld: 160 mg/dL — ABNORMAL HIGH (ref 65–99)
POTASSIUM: 4.2 mmol/L (ref 3.5–5.1)
Sodium: 134 mmol/L — ABNORMAL LOW (ref 135–145)
TOTAL PROTEIN: 6 g/dL — AB (ref 6.5–8.1)

## 2016-12-23 LAB — CBC WITH DIFFERENTIAL/PLATELET
BASOS ABS: 0 10*3/uL (ref 0.0–0.1)
Basophils Relative: 0 %
EOS ABS: 0.2 10*3/uL (ref 0.0–0.7)
EOS PCT: 2 %
HCT: 33.8 % — ABNORMAL LOW (ref 36.0–46.0)
HEMOGLOBIN: 11.2 g/dL — AB (ref 12.0–15.0)
LYMPHS ABS: 0.5 10*3/uL — AB (ref 0.7–4.0)
LYMPHS PCT: 6 %
MCH: 32.8 pg (ref 26.0–34.0)
MCHC: 33.1 g/dL (ref 30.0–36.0)
MCV: 99.1 fL (ref 78.0–100.0)
Monocytes Absolute: 0.6 10*3/uL (ref 0.1–1.0)
Monocytes Relative: 7 %
NEUTROS PCT: 85 %
Neutro Abs: 7.1 10*3/uL (ref 1.7–7.7)
PLATELETS: 230 10*3/uL (ref 150–400)
RBC: 3.41 MIL/uL — AB (ref 3.87–5.11)
RDW: 14.8 % (ref 11.5–15.5)
WBC: 8.3 10*3/uL (ref 4.0–10.5)

## 2016-12-23 LAB — GLUCOSE, CAPILLARY
GLUCOSE-CAPILLARY: 151 mg/dL — AB (ref 65–99)
GLUCOSE-CAPILLARY: 126 mg/dL — AB (ref 65–99)
GLUCOSE-CAPILLARY: 110 mg/dL — AB (ref 65–99)
Glucose-Capillary: 129 mg/dL — ABNORMAL HIGH (ref 65–99)

## 2016-12-23 MED ORDER — LORAZEPAM 1 MG PO TABS
1.0000 mg | ORAL_TABLET | Freq: Once | ORAL | Status: AC
  Administered 2016-12-23: 1 mg via ORAL
  Filled 2016-12-23: qty 1

## 2016-12-23 MED ORDER — VANCOMYCIN HCL IN DEXTROSE 750-5 MG/150ML-% IV SOLN: 750.0000 mg | INTRAVENOUS | Status: DC

## 2016-12-23 MED ORDER — CEFTRIAXONE SODIUM 1 G IJ SOLR
1.0000 g | INTRAMUSCULAR | Status: DC
Start: 2016-12-24 — End: 2016-12-25
  Administered 2016-12-24 – 2016-12-25 (×2): 1 g via INTRAVENOUS
  Filled 2016-12-23 (×2): qty 10

## 2016-12-23 MED ORDER — DEXTROSE 5 % IV SOLN: 1.0000 g | INTRAVENOUS | Status: DC

## 2016-12-23 MED ORDER — AZITHROMYCIN 250 MG PO TABS
500.0000 mg | ORAL_TABLET | Freq: Every day | ORAL | Status: DC
  Administered 2016-12-23 – 2016-12-25 (×3): 500 mg via ORAL
  Filled 2016-12-23 (×3): qty 2

## 2016-12-23 NOTE — Progress Notes (Signed)
Pharmacy Antibiotic Note  Cynthia Wright is a 83 y.o. female admitted on 12/22/2016 with pneumonia.  Pharmacy has been consulted for Vancomycin, cefepime dosing.  Plan: Vancomycin 750 iv q24hr Cefepime 1gm iv q12hr >> to q24hr based on renal function  Height: 5\' 5"  (165.1 cm) Weight: 121 lb (54.9 kg) IBW/kg (Calculated) : 57  Temp (24hrs), Avg:99.1 F (37.3 C), Min:97.9 F (36.6 C), Max:100 F (37.8 C)   Recent Labs Lab 12/22/16 1717 12/22/16 2049 12/23/16 0405  WBC 9.4  --  8.3  CREATININE 1.03*  --  0.97  LATICACIDVEN  --  1.02  --     Estimated Creatinine Clearance: 33.4 mL/min (by C-G formula based on SCr of 0.97 mg/dL).    Allergies  Allergen Reactions  . Codeine Nausea And Vomiting  . Lortab [Hydrocodone-Acetaminophen] Nausea And Vomiting    Antimicrobials this admission: 9/24 Azith/CTX x1 in ED 9/24 Vancomycin  >> 9/24 Cefepime >>   Dose adjustments this admission:  Microbiology results: 9/24 BCx: sent         Sputum: ordered         Strep pneumo/Legionella: ordered  Thank you for allowing pharmacy to be a part of this patient's care.  Minda Ditto PharmD Pager [phone] 12/23/2016, 10:02 AM

## 2016-12-23 NOTE — Progress Notes (Signed)
Pharmacy Antibiotic Note  Cynthia Wright is a 83 y.o. female admitted on 12/22/2016 with pneumonia.  Pharmacy has been consulted for Vancomycin, cefepime dosing.  Plan: Vancomycin 750 iv q24hr Cefepime 1gm iv q12hr    Height: 5\' 5"  (165.1 cm) Weight: 121 lb (54.9 kg) IBW/kg (Calculated) : 57  Temp (24hrs), Avg:99.1 F (37.3 C), Min:97.9 F (36.6 C), Max:100 F (37.8 C)   Recent Labs Lab 12/22/16 1717 12/22/16 2049 12/23/16 0405  WBC 9.4  --  8.3  CREATININE 1.03*  --  0.97  LATICACIDVEN  --  1.02  --     Estimated Creatinine Clearance: 33.4 mL/min (by C-G formula based on SCr of 0.97 mg/dL).    Allergies  Allergen Reactions  . Codeine Nausea And Vomiting  . Lortab [Hydrocodone-Acetaminophen] Nausea And Vomiting    Antimicrobials this admission: Vancomycin 12/22/2016 >> Cefepime 12/22/2016 >>    Dose adjustments this admission: -  Microbiology results: pending  Thank you for allowing pharmacy to be a part of this patient's care.  Nani Skillern [place] 12/23/2016 6:42 AM

## 2016-12-23 NOTE — Progress Notes (Signed)
PHARMACY NOTE -  CEFTRIAXONE  Pharmacy has been consulted to assist with dosing of Ceftriaxone for CAP.  Patient is known to pharmacy for Vancomycin and Cefepime dosing.  Vancomycin d/c'ed today and Cefepime d/c'ed this evening.  MD has ordered Azithromycin 500mg  po daily along with Ceftriaxone.  Last dose of Cefepime 1gm IV q24h was charted @ 9:45 today, so will begin Ceftriaxone 24 hr after last Cefepime dose.  PLAN: Ceftriaxone 1gm IV q24h  Need for further dosage adjustment appears unlikely at present.    Will sign off at this time.  Please reconsult if a change in clinical status warrants re-evaluation of dosage.  Leone Haven, PharmD 12/23/16 @ 18:30

## 2016-12-23 NOTE — Progress Notes (Signed)
[facility] Progress Note  Patient: Cynthia Wright EGB:[id number]   PCP: Aretta Nip, MD DOB: [birth year]   DOA: 12/22/2016   DOS: 12/23/2016   Date of Service: the patient was seen and examined on 12/23/2016  Subjective: Feeling better, no nausea no vomiting. Breathing is better as well. No fever.  Brief hospital course: Pt. with PMH of dementia, A. fib, CHF, type II DM, HTN; admitted on 12/22/2016, presented with complaint of fatigue and cough, was found to have healthcare associated pneumonia. Currently further plan is continue IV antibiotics.  Assessment and Plan: 1. HCAP  - Pt presents with increased confusion, lethargy, and productive cough, and is found to desaturate with minimal exertion and have suspected basilar PNA on CXR  - Blood cultures were collected in ED, will add sputum culture and check strep pneumo antigen  - Continue empiric abx with ceftriaxone azithromycin.  2. Elevated LFT's  - Transaminases elevated to 150-range, recently normal  - Pt denies abd pain and abd exam benign; she does report vomiting  -No acute abnormality on the ultrasound liver. LFTs trending down. Monitor.  3. Paroxysmal atrial fibrillation  - In a sinus rhythm on admission  - Continue amiodarone and diltiazem  - CHADS-VASc is 19 (age x2, gender, HTN, DM, CHF) - Continue Eliquis   4. Chronic diastolic CHF  - Pt appears roughly euvolemic on admission, reports recent orthopnea, states leg swelling is at baseline  - Treated in ED with 1 liter NS  - Plan to SLIV, follow daily wts and I/O's, continue Lasix 20 mg qD    5. Type II DM  - A1c was 6.1% in July '18  - Managed at home with Januvia, held on admission  - Check CBG with meals and qHS, start a low-intensity SSI with Novolog   Diet: Cardiac diet DVT Prophylaxis: on therapeutic anticoagulation.  Advance goals of care discussion: Full code  Family Communication: family was present at bedside, at the time of interview.  The pt provided permission to discuss medical plan with the family. Opportunity was given to ask question and all questions were answered satisfactorily.   Disposition:  Discharge to home.  Consultants: none Procedures: none  Antibiotics: Anti-infectives    Start     Dose/Rate Route Frequency Ordered Stop   12/24/16 1000  ceFEPIme (MAXIPIME) 1 g in dextrose 5 % 50 mL IVPB  Status:  Discontinued     1 g 100 mL/hr over 30 Minutes Intravenous Every 24 hours 12/23/16 1000 12/23/16 1119   12/23/16 2200  vancomycin (VANCOCIN) IVPB 750 mg/150 ml premix  Status:  Discontinued     750 mg 150 mL/hr over 60 Minutes Intravenous Every 24 hours 12/23/16 0641 12/23/16 1119   12/23/16 0000  vancomycin (VANCOCIN) IVPB 750 mg/150 ml premix     750 mg 150 mL/hr over 60 Minutes Intravenous  Once 12/22/16 2349 12/23/16 0100   12/22/16 2230  ceFEPIme (MAXIPIME) 1 g in dextrose 5 % 50 mL IVPB  Status:  Discontinued     1 g 100 mL/hr over 30 Minutes Intravenous Every 12 hours 12/22/16 2217 12/23/16 1000   12/22/16 2015  cefTRIAXone (ROCEPHIN) 1 g in dextrose 5 % 50 mL IVPB     1 g 100 mL/hr over 30 Minutes Intravenous  Once 12/22/16 2007 12/22/16 2200   12/22/16 2015  azithromycin (ZITHROMAX) tablet 500 mg     500 mg Oral  Once 12/22/16 2007 12/22/16 2323       Objective: Physical  Exam: Vitals:   12/22/16 2221 12/22/16 2300 12/23/16 0607 12/23/16 1430  BP: 135/72 (!) 176/60 (!) 176/55 (!) 180/61  Pulse: 80 80 89 84  Resp: (!) 24 (!) 22 (!) 21 20  Temp:  99.5 F (37.5 C) 100 F (37.8 C) 98.9 F (37.2 C)  TempSrc:  Oral Oral Oral  SpO2: 95% 98% 93% 94%  Weight:      Height:        Intake/Output Summary (Last 24 hours) at 12/23/16 1813 Last data filed at 12/23/16 1630  Gross per 24 hour  Intake               50 ml  Output             1220 ml  Net            -1170 ml   Filed Weights   12/22/16 1702  Weight: 54.9 kg (121 lb)   General: Alert, Awake and Oriented to Time, Place and  Person. Appear in mild distress, affect appropriate Eyes: PERRL, Conjunctiva normal ENT: Oral Mucosa clear moist. Neck: no JVD, no Abnormal Mass Or lumps Cardiovascular: S1 and S2 Present, no Murmur, Peripheral Pulses Present Respiratory: normal respiratory effort, Bilateral Air entry equal and Decreased, no use of accessory muscle, Clear to Auscultation, no Crackles, no wheezes Abdomen: Bowel Sound present, Soft and no tenderness, no hernia Skin: no redness, no Rash, no induration Extremities: no Pedal edema, no calf tenderness Neurologic: Grossly no focal neuro deficit. Bilaterally Equal motor strength  Data Reviewed: CBC:  Recent Labs Lab 12/22/16 1717 12/23/16 0405  WBC 9.4 8.3  NEUTROABS 8.8* 7.1  HGB 11.9* 11.2*  HCT 36.0 33.8*  MCV 99.2 99.1  PLT 210 643   Basic Metabolic Panel:  Recent Labs Lab 12/22/16 1717 12/23/16 0405  NA 134* 134*  K 4.0 4.2  CL 101 102  CO2 24 22  GLUCOSE 209* 160*  BUN 24* 21*  CREATININE 1.03* 0.97  CALCIUM 8.3* 8.1*    Liver Function Tests:  Recent Labs Lab 12/22/16 1717 12/23/16 0405  AST 145* 130*  ALT 149* 142*  ALKPHOS 297* 257*  BILITOT 0.6 0.7  PROT 6.5 6.0*  ALBUMIN 3.0* 2.7*   No results for input(s): LIPASE, AMYLASE in the last 168 hours. No results for input(s): AMMONIA in the last 168 hours. Coagulation Profile: No results for input(s): INR, PROTIME in the last 168 hours. Cardiac Enzymes:  Recent Labs Lab 12/22/16 1717  TROPONINI <0.03   BNP (last 3 results) No results for input(s): PROBNP in the last 8760 hours. CBG:  Recent Labs Lab 12/22/16 2321 12/23/16 0821 12/23/16 1146  GLUCAP 172* 151* 126*   Studies: US Abdomen Limited Ruq  Result Date: 12/23/2016 CLINICAL DATA:  Elevated liver function tests. EXAM: ULTRASOUND ABDOMEN LIMITED RIGHT UPPER QUADRANT COMPARISON:  December 16, 2012 FINDINGS: Gallbladder: Status post prior cholecystectomy. Common bile duct: Diameter: 7.3 mm. Liver: There  are multiple cystic areas identified throughout the liver unchanged compared to prior ultrasound. There is a 3.3 x 3.5 cm hyperechoic mass in the liver unchanged compared to prior ultrasound. Portal vein is patent on color Doppler imaging with normal direction of blood flow towards the liver. There is incidental finding of a right pleural effusion. There is incidental finding of right hydronephrosis. IMPRESSION: Stable cysts and stable hyperechoic mass in the liver unchanged compare with prior ultrasound of 2014. Incidental finding of right hydronephrosis and right pleural effusion. Prior cholecystectomy. Electronically Signed  By: Abelardo Diesel M.D.   On: 12/23/2016 17:42    Scheduled Meds: . amiodarone  200 mg Oral Daily  . apixaban  2.5 mg Oral BID  . diltiazem  240 mg Oral Daily  . docusate sodium  200 mg Oral QHS  . furosemide  20 mg Oral Daily  . insulin aspart  0-15 Units Subcutaneous TID WC  . insulin aspart  0-5 Units Subcutaneous QHS  . iron polysaccharides  150 mg Oral Daily  . polyethylene glycol  17 g Oral Daily  . potassium chloride SA  20 mEq Oral Daily  . sodium chloride flush  3 mL Intravenous Q12H  . zolpidem  5 mg Oral QHS   Continuous Infusions: . sodium chloride     PRN Meds: sodium chloride, acetaminophen, HYDROcodone-acetaminophen, ondansetron (ZOFRAN) IV, sodium chloride flush  Time spent: 35 minutes  Author: Berle Mull, MD [facility] Pager: [phone] 12/23/2016 6:13 PM  If 7PM-7AM, please contact night-coverage at [email or web address], password [facility]

## 2016-12-24 ENCOUNTER — Inpatient Hospital Stay (HOSPITAL_COMMUNITY): Payer: Medicare Other

## 2016-12-24 ENCOUNTER — Encounter (HOSPITAL_COMMUNITY): Payer: Self-pay | Admitting: Radiology

## 2016-12-24 LAB — COMPREHENSIVE METABOLIC PANEL
ALBUMIN: 2.8 g/dL — AB (ref 3.5–5.0)
ALK PHOS: 285 U/L — AB (ref 38–126)
ALT: 112 U/L — ABNORMAL HIGH (ref 14–54)
ANION GAP: 11 (ref 5–15)
AST: 57 U/L — AB (ref 15–41)
BUN: 20 mg/dL (ref 6–20)
CALCIUM: 8.4 mg/dL — AB (ref 8.9–10.3)
CO2: 23 mmol/L (ref 22–32)
Chloride: 101 mmol/L (ref 101–111)
Creatinine, Ser: 0.78 mg/dL (ref 0.44–1.00)
GFR calc non Af Amer: >60 mL/min (ref >60)
GLUCOSE: 154 mg/dL — AB (ref 65–99)
Potassium: 4.1 mmol/L (ref 3.5–5.1)
Sodium: 135 mmol/L (ref 135–145)
TOTAL PROTEIN: 6.1 g/dL — AB (ref 6.5–8.1)
Total Bilirubin: 0.6 mg/dL (ref 0.3–1.2)

## 2016-12-24 LAB — PROTIME-INR
INR: 1.11
Prothrombin Time: 14.2 seconds (ref 11.4–15.2)

## 2016-12-24 LAB — CBC WITH DIFFERENTIAL/PLATELET
BASOS ABS: 0 10*3/uL (ref 0.0–0.1)
BASOS PCT: 0 %
Eosinophils Absolute: 0.1 10*3/uL (ref 0.0–0.7)
Eosinophils Relative: 1 %
HEMATOCRIT: 36.2 % (ref 36.0–46.0)
HEMOGLOBIN: 11.8 g/dL — AB (ref 12.0–15.0)
Lymphocytes Relative: 7 %
Lymphs Abs: 0.7 10*3/uL (ref 0.7–4.0)
MCH: 32.5 pg (ref 26.0–34.0)
MCHC: 32.6 g/dL (ref 30.0–36.0)
MCV: 99.7 fL (ref 78.0–100.0)
Monocytes Absolute: 0.4 10*3/uL (ref 0.1–1.0)
Monocytes Relative: 4 %
NEUTROS ABS: 8.5 10*3/uL — AB (ref 1.7–7.7)
NEUTROS PCT: 88 %
Platelets: 245 10*3/uL (ref 150–400)
RBC: 3.63 MIL/uL — AB (ref 3.87–5.11)
RDW: 14.6 % (ref 11.5–15.5)
WBC: 9.6 10*3/uL (ref 4.0–10.5)

## 2016-12-24 LAB — GLUCOSE, CAPILLARY
Glucose-Capillary: 160 mg/dL — ABNORMAL HIGH (ref 65–99)
Glucose-Capillary: 160 mg/dL — ABNORMAL HIGH (ref 65–99)
Glucose-Capillary: 86 mg/dL (ref 65–99)
Glucose-Capillary: 135 mg/dL — ABNORMAL HIGH (ref 65–99)

## 2016-12-24 LAB — STREP PNEUMONIAE URINARY ANTIGEN: STREP PNEUMO URINARY ANTIGEN: NEGATIVE

## 2016-12-24 LAB — LACTIC ACID, PLASMA: LACTIC ACID, VENOUS: 0.9 mmol/L (ref 0.5–1.9)

## 2016-12-24 LAB — MAGNESIUM: MAGNESIUM: 1.9 mg/dL (ref 1.7–2.4)

## 2016-12-24 MED ORDER — FUROSEMIDE 10 MG/ML IJ SOLN
40.0000 mg | Freq: Once | INTRAMUSCULAR | Status: AC
  Administered 2016-12-24: 40 mg via INTRAVENOUS
  Filled 2016-12-24: qty 4

## 2016-12-24 MED ORDER — IOPAMIDOL (ISOVUE-370) INJECTION 76%
100.0000 mL | Freq: Once | INTRAVENOUS | Status: AC | PRN
  Administered 2016-12-24: 100 mL via INTRAVENOUS

## 2016-12-24 MED ORDER — ALBUTEROL SULFATE (2.5 MG/3ML) 0.083% IN NEBU
5.0000 mg | INHALATION_SOLUTION | Freq: Once | RESPIRATORY_TRACT | Status: AC
  Administered 2016-12-24: 5 mg via RESPIRATORY_TRACT

## 2016-12-24 MED ORDER — ALBUTEROL SULFATE (2.5 MG/3ML) 0.083% IN NEBU
INHALATION_SOLUTION | RESPIRATORY_TRACT | Status: AC
  Administered 2016-12-24: 5 mg via RESPIRATORY_TRACT
  Filled 2016-12-24: qty 3

## 2016-12-24 MED ORDER — ALBUTEROL SULFATE (2.5 MG/3ML) 0.083% IN NEBU
INHALATION_SOLUTION | RESPIRATORY_TRACT | Status: AC
  Administered 2016-12-24: 2.5 mg via RESPIRATORY_TRACT
  Filled 2016-12-24: qty 3

## 2016-12-24 MED ORDER — ALBUTEROL SULFATE (2.5 MG/3ML) 0.083% IN NEBU
2.5000 mg | INHALATION_SOLUTION | RESPIRATORY_TRACT | Status: DC | PRN
  Administered 2016-12-24: 2.5 mg via RESPIRATORY_TRACT

## 2016-12-24 MED ORDER — IPRATROPIUM BROMIDE 0.02 % IN SOLN
RESPIRATORY_TRACT | Status: AC
  Administered 2016-12-24: 0.5 mg via RESPIRATORY_TRACT
  Filled 2016-12-24: qty 2.5

## 2016-12-24 MED ORDER — IOPAMIDOL (ISOVUE-370) INJECTION 76%
INTRAVENOUS | Status: AC
  Filled 2016-12-24: qty 100

## 2016-12-24 MED ORDER — IPRATROPIUM BROMIDE 0.02 % IN SOLN
0.5000 mg | Freq: Once | RESPIRATORY_TRACT | Status: AC
  Administered 2016-12-24: 0.5 mg via RESPIRATORY_TRACT

## 2016-12-24 NOTE — Progress Notes (Addendum)
Rapid Response Event Note  Overview:  RRT called to pt room for pt having a decreased in O2 sats in the 80s, RR 30 with respiratory distressed and increased WOB at rest. Per RT wheezing was auscultated. Pt was given albuterol by RT and IV Lasix given prior to RRT arrival.     Initial Focused Assessment: Pt at rest, appears to be in no distress at this time. HR 85, RR 24, 93% on 3LNC, BP 152/43. Lungs auscultated were clear, possibly some fine crackles in the LUL.   Interventions: RRT allowed pt to rest. NP arrived at bedside and CXR was ordered for 0700, allowing pt to rest at this time.   Plan of Care (if not transferred): RN to notify RRT if pt begins showing signs of respiratory distress or increased WOB. RN to continue to assess oxygen saturation, RR, and for changes in pt mentation. CXR to be completed. Pt to remain in 1343 at this time.    Casimer Bilis

## 2016-12-24 NOTE — Progress Notes (Signed)
[facility]  Patient Status: Active (receiving services up to time of hospitalization)  [facility] is providing the following services: RN and PT  If patient discharges after hours, please call [phone].   Cynthia Wright 12/24/2016, 9:50 AM

## 2016-12-24 NOTE — H&P (Signed)
Called to pt bedside for pt assessment due to low O2 sats in the mid 80s.  Pt found on 5lnc, HR79, RR28-32, spo2 95%.  Respiratory distress/increased wob noted with dyspnea at rest.  Rapid response nurse has been called per RN.  Pt given albuterol/atrovent hhn emergently due to pt condition.  Pt tolerated tx well.  RT will continue to monitor and assess as needed.

## 2016-12-24 NOTE — Progress Notes (Signed)
PT Cancellation Note  Patient Details Name: Cynthia Wright MRN: [id number] DOB: [birth year]   Cancelled Treatment:    Reason Eval/Treat Not Completed: Patient at procedure or test/unavailable. Pt at CT. Will follow.    Blondell Reveal Kistler 12/24/2016, 2:50 PM [phone]

## 2016-12-24 NOTE — Progress Notes (Signed)
Pt was found to be in respiratory distress. O2 sats in the mid 80s. Called respiratory, RR and paged the MD. Albuterol and lasix were administered. Patient is now resting comfortably. Will continue to monitor

## 2016-12-24 NOTE — Progress Notes (Signed)
[facility] Progress Note  Patient: Cynthia Wright OZD:[id number]   PCP: Aretta Nip, MD DOB: [birth year]   DOA: 12/22/2016   DOS: 12/24/2016   Date of Service: the patient was seen and examined on 12/24/2016  Subjective: Overnight had more difficulty shortness of breathing. No nausea no vomiting. No chest pain. Cough is still present. Urinating okay.  Brief hospital course: Pt. with PMH of dementia, A. fib, CHF, type II DM, HTN; admitted on 12/22/2016, presented with complaint of fatigue and cough, was found to have healthcare associated pneumonia. Currently further plan is continue IV antibiotics.  Assessment and Plan: 1. HCAP  - Pt presents with increased confusion, lethargy, and productive cough, and is found to desaturate with minimal exertion and have suspected basilar PNA on CXR  - Blood cultures were collected in ED, will add sputum culture and check strep pneumo antigen  - Continue empiric abx with ceftriaxone azithromycin. CT PE negative, patient has moderate pleural effusion, will get thoracentesis tomorrow for further workup.  2. Elevated LFT's  - Transaminases elevated to 150-range, recently normal  - Pt denies abd pain and abd exam benign; she does report vomiting  -No acute abnormality on the ultrasound liver. LFTs trending down.  Ultrasound abdomen unremarkable..  3. Paroxysmal atrial fibrillation  - In a sinus rhythm on admission  - Continue amiodarone and diltiazem  - CHADS-VASc is 65 (age x2, gender, HTN, DM, CHF) - Continue Eliquis   4. Chronic diastolic CHF  - Pt appears roughly euvolemic on admission, reports recent orthopnea, states leg swelling is at baseline  - Treated in ED with 1 liter NS  - Plan to SLIV, follow daily wts and I/O's, continue Lasix 20 mg qD    5. Type II DM  - A1c was 6.1% in July '18  - Managed at home with Januvia, held on admission  - Check CBG with meals and qHS, start a low-intensity SSI with Novolog   6.  Right-sided hydronephrosis. We'll discuss with urology regarding further workup and treatment.  Diet: Cardiac diet DVT Prophylaxis: on therapeutic anticoagulation.  Advance goals of care discussion: Full code  Family Communication: family was present at bedside, at the time of interview. The pt provided permission to discuss medical plan with the family. Opportunity was given to ask question and all questions were answered satisfactorily.   Disposition:  Discharge to SNF.  Consultants: none Procedures: none  Antibiotics: Anti-infectives    Start     Dose/Rate Route Frequency Ordered Stop   12/24/16 1000  ceFEPIme (MAXIPIME) 1 g in dextrose 5 % 50 mL IVPB  Status:  Discontinued     1 g 100 mL/hr over 30 Minutes Intravenous Every 24 hours 12/23/16 1000 12/23/16 1119   12/24/16 1000  cefTRIAXone (ROCEPHIN) 1 g in dextrose 5 % 50 mL IVPB     1 g 100 mL/hr over 30 Minutes Intravenous Every 24 hours 12/23/16 1822     12/23/16 2200  vancomycin (VANCOCIN) IVPB 750 mg/150 ml premix  Status:  Discontinued     750 mg 150 mL/hr over 60 Minutes Intravenous Every 24 hours 12/23/16 0641 12/23/16 1119   12/23/16 2200  azithromycin (ZITHROMAX) tablet 500 mg     500 mg Oral Daily at bedtime 12/23/16 1817     12/23/16 0000  vancomycin (VANCOCIN) IVPB 750 mg/150 ml premix     750 mg 150 mL/hr over 60 Minutes Intravenous  Once 12/22/16 2349 12/23/16 0100   12/22/16 2230  ceFEPIme (MAXIPIME)  1 g in dextrose 5 % 50 mL IVPB  Status:  Discontinued     1 g 100 mL/hr over 30 Minutes Intravenous Every 12 hours 12/22/16 2217 12/23/16 1000   12/22/16 2015  cefTRIAXone (ROCEPHIN) 1 g in dextrose 5 % 50 mL IVPB     1 g 100 mL/hr over 30 Minutes Intravenous  Once 12/22/16 2007 12/22/16 2200   12/22/16 2015  azithromycin (ZITHROMAX) tablet 500 mg     500 mg Oral  Once 12/22/16 2007 12/22/16 2323       Objective: Physical Exam: Vitals:   12/23/16 2138 12/24/16 0359 12/24/16 0449 12/24/16 1500  BP:  134/75  (!) 171/48 (!) 158/74  Pulse: 80  78 74  Resp: 20  (!) 27 (!) 22  Temp: 97.8 F (36.6 C)  98.3 F (36.8 C) 98.2 F (36.8 C)  TempSrc: Oral  Axillary Oral  SpO2: 96% 95% 95% 95%  Weight:      Height:        Intake/Output Summary (Last 24 hours) at 12/24/16 1903 Last data filed at 12/24/16 1239  Gross per 24 hour  Intake              240 ml  Output             1200 ml  Net             -960 ml   Filed Weights   12/22/16 1702  Weight: 54.9 kg (121 lb)   General: Alert, Awake and Oriented to Time, Place and Person. Appear in mild distress, affect appropriate Eyes: PERRL, Conjunctiva normal ENT: Oral Mucosa clear moist. Neck: no JVD, no Abnormal Mass Or lumps Cardiovascular: S1 and S2 Present, no Murmur, Peripheral Pulses Present Respiratory: normal respiratory effort, Bilateral Air entry equal and Decreased, no use of accessory muscle, Clear to Auscultation, no Crackles, no wheezes Abdomen: Bowel Sound present, Soft and no tenderness, no hernia Skin: no redness, no Rash, no induration Extremities: no Pedal edema, no calf tenderness Neurologic: Grossly no focal neuro deficit. Bilaterally Equal motor strength  Data Reviewed: CBC:  Recent Labs Lab 12/22/16 1717 12/23/16 0405 12/24/16 0354  WBC 9.4 8.3 9.6  NEUTROABS 8.8* 7.1 8.5*  HGB 11.9* 11.2* 11.8*  HCT 36.0 33.8* 36.2  MCV 99.2 99.1 99.7  PLT 210 230 941   Basic Metabolic Panel:  Recent Labs Lab 12/22/16 1717 12/23/16 0405 12/24/16 0354  NA 134* 134* 135  K 4.0 4.2 4.1  CL 101 102 101  CO2 24 22 23   GLUCOSE 209* 160* 154*  BUN 24* 21* 20  CREATININE 1.03* 0.97 0.78  CALCIUM 8.3* 8.1* 8.4*  MG  --   --  1.9    Liver Function Tests:  Recent Labs Lab 12/22/16 1717 12/23/16 0405 12/24/16 0354  AST 145* 130* 57*  ALT 149* 142* 112*  ALKPHOS 297* 257* 285*  BILITOT 0.6 0.7 0.6  PROT 6.5 6.0* 6.1*  ALBUMIN 3.0* 2.7* 2.8*   No results for input(s): LIPASE, AMYLASE in the last 168  hours. No results for input(s): AMMONIA in the last 168 hours. Coagulation Profile:  Recent Labs Lab 12/24/16 0354  INR 1.11   Cardiac Enzymes:  Recent Labs Lab 12/22/16 1717  TROPONINI <0.03   BNP (last 3 results) No results for input(s): PROBNP in the last 8760 hours. CBG:  Recent Labs Lab 12/23/16 1736 12/23/16 2145 12/24/16 0731 12/24/16 1156 12/24/16 1813  GLUCAP 110* 129* 160* 160* 86   Studies:  Dg Chest 1 View  Result Date: 12/24/2016 CLINICAL DATA:  Respiratory distress EXAM: CHEST 1 VIEW COMPARISON:  12/22/2016 chest radiograph. FINDINGS: Stable cardiomediastinal silhouette with mild cardiomegaly and aortic atherosclerosis. No pneumothorax. Small bilateral pleural effusions, increased on the right and stable on left. Increased hazy and linear parahilar opacities in both lungs. Stable left retrocardiac opacity. IMPRESSION: 1. Stable mild cardiomegaly with increased hazy and linear bilateral parahilar lung opacities, favor worsening mild-to-moderate pulmonary edema due to congestive heart failure. 2. Small bilateral pleural effusions, increased on the right and stable on the left . 3. Stable left retrocardiac opacity, favor atelectasis. Electronically Signed   By: Ilona Sorrel M.D.   On: 12/24/2016 09:02   Ct Angio Chest Pe W Or Wo Contrast  Result Date: 12/24/2016 CLINICAL DATA:  Pleural effusion; Hypoxemia or resp failure, unknown cause EXAM: CT ANGIOGRAPHY CHEST WITH CONTRAST TECHNIQUE: Multidetector CT imaging of the chest was performed using the standard protocol during bolus administration of intravenous contrast. Multiplanar CT image reconstructions and MIPs were obtained to evaluate the vascular anatomy. CONTRAST:  100 mL of Isovue 370 intravenous contrast COMPARISON:  Current chest radiograph. FINDINGS: Cardiovascular: Satisfactory opacification of the pulmonary arteries to the segmental level. No evidence of pulmonary embolism. Heart is mildly enlarged. Moderate  three-vessel coronary artery calcifications. No pericardial effusion. Thoracic aortic atherosclerotic calcifications along the arch and descending portion. No evidence of a dissection or aneurysm. Mediastinum/Nodes: Thyroid mildly enlarged, most notably the right lobe. A nodular extension from the right lobe projects just below the sternal notch. No convincing neck base or axillary masses or enlarged lymph nodes. There are prominent mediastinal lymph nodes, none of which are pathologically enlarged by size criteria. No hilar masses or enlarged lymph nodes. The trachea is widely patent. Esophagus is unremarkable. Lungs/Pleura: Moderate right and moderate to large left pleural effusions. There are patchy bilateral areas of peribronchovascular and peripheral ground-glass type opacity, most evident in the upper lobes. Dependent opacity in the lower lobes adjacent to the pleural effusions is consistent with atelectasis. Some fluid tracks along the left oblique fissure. No pneumothorax. No lung mass or suspicious nodule. Upper Abdomen: Multiple low-density liver lesions are noted, largest in the left lobe lateral segment measuring 5.6 cm, with average Hounsfield units less than 10. These are all likely cysts. No acute findings in the visible upper abdomen. Musculoskeletal: There are mild compression fractures of T7 and T8 present on a lateral chest radiograph dated 12/22/2016, but new since a lateral chest radiograph dated 12/28/2012. No other fractures. No osteoblastic or osteolytic lesions. Review of the MIP images confirms the above findings. IMPRESSION: 1. No evidence of a pulmonary embolism. 2. Moderate to large left and moderate right pleural effusions with significant associated dependent lower lobe atelectasis. Mild cardiomegaly. 3. Patchy areas of predominantly peribronchovascular and peripheral ground-glass opacity. Possible etiologies include infectious and inflammatory. Patchy pulmonary edema from congestive  heart failure is also possible. Aortic Atherosclerosis (ICD10-I70.0). Electronically Signed   By: Lajean Manes M.D.   On: 12/24/2016 15:39   US Renal  Result Date: 12/24/2016 CLINICAL DATA:  Follow-up right-sided hydronephrosis demonstrated on abdominal ultrasound of December 23, 2016. EXAM: RENAL / URINARY TRACT ULTRASOUND COMPLETE COMPARISON:  Abdominal ultrasound of December 23, 2016 FINDINGS: Right Kidney: Length: 10.6 cm. There is moderate hydronephrosis. The renal cortical echotexture is increased and is greater than that of the adjacent liver. No calcified stones are observed. Left Kidney: Length: 8.3 cm. The renal cortical echotexture is similar to that of the  right kidney. There is minimal hydronephrosis on the left. No stones are evident. Bladder: The partially distended urinary bladder is normal. A right ureteral jet is observed. No definite left ureteral jet was demonstrated. IMPRESSION: Moderate right-sided hydronephrosis and minimal left-sided hydronephrosis. Increased renal cortical echotexture bilaterally compatible with medical renal disease. Electronically Signed   By: David  Martinique M.D.   On: 12/24/2016 13:00    Scheduled Meds: . amiodarone  200 mg Oral Daily  . apixaban  2.5 mg Oral BID  . azithromycin  500 mg Oral QHS  . diltiazem  240 mg Oral Daily  . docusate sodium  200 mg Oral QHS  . furosemide  20 mg Oral Daily  . insulin aspart  0-15 Units Subcutaneous TID WC  . insulin aspart  0-5 Units Subcutaneous QHS  . iopamidol      . iron polysaccharides  150 mg Oral Daily  . polyethylene glycol  17 g Oral Daily  . potassium chloride SA  20 mEq Oral Daily  . sodium chloride flush  3 mL Intravenous Q12H  . zolpidem  5 mg Oral QHS   Continuous Infusions: . sodium chloride    . cefTRIAXone (ROCEPHIN)  IV Stopped (12/24/16 1032)   PRN Meds: sodium chloride, acetaminophen, albuterol, HYDROcodone-acetaminophen, ondansetron (ZOFRAN) IV, sodium chloride flush  Time spent: 35  minutes  Author: Berle Mull, MD [facility] Pager: [phone] 12/24/2016 7:03 PM  If 7PM-7AM, please contact night-coverage at [email or web address], password [facility]

## 2016-12-24 NOTE — Progress Notes (Signed)
PT Cancellation Note  Patient Details Name: Cynthia Wright MRN: [id number] DOB: 1924-06-12   Cancelled Treatment:    Reason Eval/Treat Not Completed: Patient at procedure or test/unavailable. Will follow.    Blondell Reveal Kistler 12/24/2016, 12:08 PM [phone]

## 2016-12-25 LAB — CBC WITH DIFFERENTIAL/PLATELET
BASOS ABS: 0 10*3/uL (ref 0.0–0.1)
Basophils Relative: 0 %
EOS PCT: 1 %
Eosinophils Absolute: 0.1 10*3/uL (ref 0.0–0.7)
HEMATOCRIT: 33.8 % — AB (ref 36.0–46.0)
Hemoglobin: 11.1 g/dL — ABNORMAL LOW (ref 12.0–15.0)
LYMPHS ABS: 1.2 10*3/uL (ref 0.7–4.0)
LYMPHS PCT: 13 %
MCH: 32.4 pg (ref 26.0–34.0)
MCHC: 32.8 g/dL (ref 30.0–36.0)
MCV: 98.5 fL (ref 78.0–100.0)
MONO ABS: 0.8 10*3/uL (ref 0.1–1.0)
Monocytes Relative: 10 %
NEUTROS ABS: 6.6 10*3/uL (ref 1.7–7.7)
Neutrophils Relative %: 76 %
Platelets: 274 10*3/uL (ref 150–400)
RBC: 3.43 MIL/uL — AB (ref 3.87–5.11)
RDW: 14.9 % (ref 11.5–15.5)
WBC: 8.7 10*3/uL (ref 4.0–10.5)

## 2016-12-25 LAB — COMPREHENSIVE METABOLIC PANEL
ALBUMIN: 2.5 g/dL — AB (ref 3.5–5.0)
ALT: 80 U/L — ABNORMAL HIGH (ref 14–54)
ANION GAP: 10 (ref 5–15)
AST: 46 U/L — AB (ref 15–41)
Alkaline Phosphatase: 219 U/L — ABNORMAL HIGH (ref 38–126)
BILIRUBIN TOTAL: 0.7 mg/dL (ref 0.3–1.2)
BUN: 18 mg/dL (ref 6–20)
CHLORIDE: 101 mmol/L (ref 101–111)
CO2: 26 mmol/L (ref 22–32)
Calcium: 7.9 mg/dL — ABNORMAL LOW (ref 8.9–10.3)
Creatinine, Ser: 0.84 mg/dL (ref 0.44–1.00)
GFR calc Af Amer: >60 mL/min (ref >60)
GFR calc non Af Amer: 59 mL/min — ABNORMAL LOW (ref >60)
GLUCOSE: 111 mg/dL — AB (ref 65–99)
POTASSIUM: 3.8 mmol/L (ref 3.5–5.1)
SODIUM: 137 mmol/L (ref 135–145)
TOTAL PROTEIN: 5.4 g/dL — AB (ref 6.5–8.1)

## 2016-12-25 LAB — GLUCOSE, CAPILLARY
GLUCOSE-CAPILLARY: 160 mg/dL — AB (ref 65–99)
GLUCOSE-CAPILLARY: 157 mg/dL — AB (ref 65–99)
Glucose-Capillary: 124 mg/dL — ABNORMAL HIGH (ref 65–99)
Glucose-Capillary: 111 mg/dL — ABNORMAL HIGH (ref 65–99)

## 2016-12-25 MED ORDER — FUROSEMIDE 40 MG PO TABS
40.0000 mg | ORAL_TABLET | Freq: Every day | ORAL | Status: DC
  Administered 2016-12-26: 40 mg via ORAL
  Filled 2016-12-25: qty 1

## 2016-12-25 MED ORDER — FUROSEMIDE 10 MG/ML IJ SOLN
40.0000 mg | Freq: Every day | INTRAMUSCULAR | Status: DC
  Administered 2016-12-25: 40 mg via INTRAVENOUS
  Filled 2016-12-25: qty 4

## 2016-12-25 NOTE — Evaluation (Signed)
Physical Therapy Evaluation Patient Details Name: Cynthia Wright MRN: [id number] DOB: 1924-04-23 Today's Date: 12/25/2016   History of Present Illness  83 y.o. female with medical history significant for dementia, atrial fibrillation on  Eliquis, chronic diastolic CHF,  type 2 diabetes mellitus, and hypertension, now presenting to the emergency department for evaluation of productive cough, anorexia, increased confusion, and fatigue. Dx of PNA.  Clinical Impression  Pt admitted with above diagnosis. Pt currently with functional limitations due to the deficits listed below (see PT Problem List). Pt ambulated 160' with RW, no loss of balance, SaO2 89-90% on room air walking, HR 105. She appears to be near baseline with mobility. Her niece will care for her upon DC home.  Pt will benefit from skilled PT to increase their independence and safety with mobility to allow discharge to the venue listed below.       Follow Up Recommendations No PT follow up    Equipment Recommendations  None recommended by PT    Recommendations for Other Services       Precautions / Restrictions Precautions Precautions: Fall Precaution Comments: pt reports 2 falls in past 1 year Restrictions Weight Bearing Restrictions: No      Mobility  Bed Mobility Overal bed mobility: Needs Assistance Bed Mobility: Supine to Sit     Supine to sit: Min assist     General bed mobility comments: min A to raise trunk  Transfers Overall transfer level: Needs assistance Equipment used: Rolling walker (2 wheeled) Transfers: Sit to/from Stand Sit to Stand: Min guard         General transfer comment: steady  Ambulation/Gait Ambulation/Gait assistance: Min guard Ambulation Distance (Feet): 160 Feet Assistive device: Rolling walker (2 wheeled) Gait Pattern/deviations: Step-through pattern;Decreased stride length   Gait velocity interpretation: at or above normal speed for age/gender General Gait Details:  steady, no LOB, SaO2 89-90% on room air walking, HR 105, no dyspnea  Stairs            Wheelchair Mobility    Modified Rankin (Stroke Patients Only)       Balance Overall balance assessment: History of Falls;Needs assistance   Sitting balance-Leahy Scale: Good       Standing balance-Leahy Scale: Fair Standing balance comment: steady with RW with walking; h/o 2 falls in past 1 year                             Pertinent Vitals/Pain Pain Assessment: No/denies pain    Home Living Family/patient expects to be discharged to:: Private residence Living Arrangements: Alone Available Help at Discharge: Family;Available PRN/intermittently (niece to stay with patient) Type of Home: House Home Access: Level entry     Home Layout: One level Home Equipment: Wheelchair - manual;Bedside commode;Walker - 4 wheels;Cane - single point;Shower seat      Prior Function Level of Independence: Independent with assistive device(s)         Comments: pt reports she was independent with RW and with ADLs     Hand Dominance   Dominant Hand: Right    Extremity/Trunk Assessment   Upper Extremity Assessment Upper Extremity Assessment: Overall WFL for tasks assessed    Lower Extremity Assessment Lower Extremity Assessment: Overall WFL for tasks assessed    Cervical / Trunk Assessment Cervical / Trunk Assessment: Normal  Communication   Communication: No difficulties  Cognition Arousal/Alertness: Awake/alert Behavior During Therapy: WFL for tasks assessed/performed Overall Cognitive Status: No family/caregiver  present to determine baseline cognitive functioning                                 General Comments: oriented, can follow commands, noted h/o dementia in chart, seems to have some short term memory deficits      General Comments      Exercises     Assessment/Plan    PT Assessment Patient needs continued PT services  PT Problem List  Decreased mobility;Decreased activity tolerance       PT Treatment Interventions Gait training;DME instruction;Functional mobility training;Therapeutic exercise;Patient/family education;Therapeutic activities    PT Goals (Current goals can be found in the Care Plan section)  Acute Rehab PT Goals Patient Stated Goal: return home PT Goal Formulation: With patient Time For Goal Achievement: 01/08/17 Potential to Achieve Goals: Good    Frequency Min 3X/week   Barriers to discharge        Co-evaluation               AM-PAC PT "6 Clicks" Daily Activity  Outcome Measure Difficulty turning over in bed (including adjusting bedclothes, sheets and blankets)?: A Little Difficulty moving from lying on back to sitting on the side of the bed? : Unable Difficulty sitting down on and standing up from a chair with arms (e.g., wheelchair, bedside commode, etc,.)?: A Little Help needed moving to and from a bed to chair (including a wheelchair)?: A Little Help needed walking in hospital room?: None Help needed climbing 3-5 steps with a railing? : A Little 6 Click Score: 17    End of Session Equipment Utilized During Treatment: Gait belt Activity Tolerance: Patient tolerated treatment well Patient left: in chair;with call bell/phone within reach;with chair alarm set (video monitor in room) Nurse Communication: Mobility status PT Visit Diagnosis: History of falling (Z91.81);Difficulty in walking, not elsewhere classified (R26.2)    Time: 6579-0383 PT Time Calculation (min) (ACUTE ONLY): 24 min   Charges:   PT Evaluation $PT Eval Low Complexity: 1 Low PT Treatments $Gait Training: 8-22 mins   PT G Codes:          Philomena Doheny 12/25/2016, 10:26 AM [phone]

## 2016-12-25 NOTE — Consult Note (Signed)
H&P Physician requesting consult: Berle Mull, MD  Chief Complaint: right hydronephrosis  History of Present Illness: 83 year old female with a history of dementia who is currently admitted with healthcare associated pneumonia. She was noted to have elevated LFTs. This prompted a abdominal ultrasound. This revealed incidental right-sided hydronephrosis. Prior to admission, she had an episode of right-sided flank pain that quickly resolved on its own. She denies any voiding complaints. She denies hematuria or dysuria. She denies any flank pain currently.Creatinine is normal  Past Medical History:  Diagnosis Date  . Atrial fibrillation ([place])    s/p DCCV 07/2012  . Benign diastolic hypertension   . Chronic anticoagulation   . Chronic diastolic CHF (congestive heart failure) ([facility])    EF now 65% by echo 10/2012 with grade II diasotlid dysfunction  . Diabetes mellitus without complication ([place])   . Dilated cardiomyopathy secondary to tachycardia ([place]) 07/2012   2D echo 10/2012 showed EF 65% with grade II diastolic dysfunction  . Elevated cholesterol   . Hyperlipidemia   . Hypertension   . Liver cyst   . Memory loss    Past Surgical History:  Procedure Laterality Date  . ANTERIOR APPROACH HEMI HIP ARTHROPLASTY Right 09/28/2016   Procedure: ANTERIOR APPROACH HEMI HIP ARTHROPLASTY;  Surgeon: Leandrew Koyanagi, MD;  Location: [facility];  Service: Orthopedics;  Laterality: Right;  . APPENDECTOMY    . BACK SURGERY     herniated disc  . CARDIOVERSION N/A 08/09/2012   Procedure: CARDIOVERSION;  Surgeon: Sueanne Margarita, MD;  Location: [facility];  Service: Cardiovascular;  Laterality: N/A;  . CARDIOVERSION N/A 09/28/2014   Procedure: CARDIOVERSION;  Surgeon: Sueanne Margarita, MD;  Location: [facility];  Service: Cardiovascular;  Laterality: N/A;  . CHOLECYSTECTOMY    . TEE WITHOUT CARDIOVERSION N/A 08/09/2012   Procedure: TRANSESOPHAGEAL ECHOCARDIOGRAM (TEE);  Surgeon: Sueanne Margarita, MD;  Location: [facility]  ENDOSCOPY;  Service: Cardiovascular;  Laterality: N/A;  talk to angie in [place].  . TONSILLECTOMY      Home Medications:  Prescriptions Prior to Admission  Medication Sig Dispense Refill Last Dose  . amiodarone (PACERONE) 200 MG tablet Take 1 tablet (200 mg total) by mouth daily. 30 tablet 0 12/22/2016 at Unknown time  . diltiazem (CARDIZEM CD) 240 MG 24 hr capsule Take 1 capsule (240 mg total) by mouth daily. 30 capsule 6 12/22/2016 at Unknown time  . docusate sodium (COLACE) 100 MG capsule Take 200 mg by mouth at bedtime.    12/21/2016 at Unknown time  . donepezil (ARICEPT) 10 MG tablet Take 10 mg by mouth at bedtime.   Past Week at Unknown time  . ELIQUIS 2.5 MG TABS tablet TAKE 1 TABLET (2.5 MG TOTAL) BY MOUTH 2 (TWO) TIMES DAILY. 60 tablet 5 12/22/2016 at 0900  . furosemide (LASIX) 20 MG tablet Take 20 mg by mouth daily.    Past Week at Unknown time  . ibuprofen (ADVIL,MOTRIN) 200 MG tablet Take 200 mg by mouth 2 (two) times daily.   12/22/2016 at Unknown time  . iron polysaccharides (NIFEREX) 150 MG capsule Take 1 capsule (150 mg total) by mouth daily.   Past Week at Unknown time  . Melatonin 10 MG TABS Take 30 mg by mouth at bedtime.   12/21/2016 at Unknown time  . polyethylene glycol (MIRALAX / GLYCOLAX) packet Take 17 g by mouth daily.   12/22/2016 at Unknown time  . potassium chloride SA (KLOR-CON M20) 20 MEQ tablet Take 2 tablets (40 mEq total) by mouth  every other day. (Patient taking differently: Take 20 mEq by mouth 2 (two) times daily. ) 30 tablet 9 12/22/2016 at Unknown time  . rosuvastatin (CRESTOR) 5 MG tablet Take 5 mg by mouth at bedtime.    12/21/2016 at Unknown time  . sitaGLIPtin (JANUVIA) 100 MG tablet Take 100 mg by mouth daily.   12/22/2016 at Unknown time  . sulfamethoxazole-trimethoprim (BACTRIM DS,SEPTRA DS) 800-160 MG tablet Take 1 tablet by mouth 2 (two) times daily.   12/22/2016 at Unknown time   Allergies:  Allergies  Allergen Reactions  . Codeine Nausea And Vomiting  .  Lortab [Hydrocodone-Acetaminophen] Nausea And Vomiting    Family History  Problem Relation Age of Onset  . Heart disease Mother   . CAD Mother   . Heart disease Father   . CAD Father   . Pancreatic cancer Brother    Social History:  reports that she has quit smoking. She has never used smokeless tobacco. She reports that she drinks alcohol. She reports that she does not use drugs.  ROS: A complete review of systems was performed.  All systems are negative except for pertinent findings as noted. ROS   Physical Exam:  Vital signs in last 24 hours: Temp:  [98 F (36.7 C)-98.4 F (36.9 C)] 98 F (36.7 C) (09/27 1605) Pulse Rate:  [72-92] 72 (09/27 1605) Resp:  [18-20] 18 (09/27 1605) BP: (152-175)/(64-75) 152/64 (09/27 1605) SpO2:  [95 %-98 %] 95 % (09/27 1605)  General:  Alert and oriented, No acute distress HEENT: Normocephalic, atraumatic Cardiovascular: adequate peripheral perfusion Lungs: Regular rate and effort Abdomen: Soft, nontender, nondistended, no abdominal masses Back: No CVA tenderness Extremities: No edema Neurologic: Grossly intact  Laboratory Data:  Results for orders placed or performed during the hospital encounter of 12/22/16 (from the past 24 hour(s))  Glucose, capillary     Status: Abnormal   Collection Time: 12/24/16  9:16 PM  Result Value Ref Range   Glucose-Capillary 135 (H) 65 - 99 mg/dL  Comprehensive metabolic panel     Status: Abnormal   Collection Time: 12/25/16  4:22 AM  Result Value Ref Range   Sodium 137 135 - 145 mmol/L   Potassium 3.8 3.5 - 5.1 mmol/L   Chloride 101 101 - 111 mmol/L   CO2 26 22 - 32 mmol/L   Glucose, Bld 111 (H) 65 - 99 mg/dL   BUN 18 6 - 20 mg/dL   Creatinine, Ser 0.84 0.44 - 1.00 mg/dL   Calcium 7.9 (L) 8.9 - 10.3 mg/dL   Total Protein 5.4 (L) 6.5 - 8.1 g/dL   Albumin 2.5 (L) 3.5 - 5.0 g/dL   AST 46 (H) 15 - 41 U/L   ALT 80 (H) 14 - 54 U/L   Alkaline Phosphatase 219 (H) 38 - 126 U/L   Total Bilirubin 0.7 0.3  - 1.2 mg/dL   GFR calc non Af Amer 59 (L) >60 mL/min   GFR calc Af Amer >60 >60 mL/min   Anion gap 10 5 - 15  CBC with Differential/Platelet     Status: Abnormal   Collection Time: 12/25/16  4:22 AM  Result Value Ref Range   WBC 8.7 4.0 - 10.5 K/uL   RBC 3.43 (L) 3.87 - 5.11 MIL/uL   Hemoglobin 11.1 (L) 12.0 - 15.0 g/dL   HCT 33.8 (L) 36.0 - 46.0 %   MCV 98.5 78.0 - 100.0 fL   MCH 32.4 26.0 - 34.0 pg   MCHC 32.8 30.0 - 36.0  g/dL   RDW 14.9 11.5 - 15.5 %   Platelets 274 150 - 400 K/uL   Neutrophils Relative % 76 %   Neutro Abs 6.6 1.7 - 7.7 K/uL   Lymphocytes Relative 13 %   Lymphs Abs 1.2 0.7 - 4.0 K/uL   Monocytes Relative 10 %   Monocytes Absolute 0.8 0.1 - 1.0 K/uL   Eosinophils Relative 1 %   Eosinophils Absolute 0.1 0.0 - 0.7 K/uL   Basophils Relative 0 %   Basophils Absolute 0.0 0.0 - 0.1 K/uL  Glucose, capillary     Status: Abnormal   Collection Time: 12/25/16  7:44 AM  Result Value Ref Range   Glucose-Capillary 124 (H) 65 - 99 mg/dL   Comment 1 Notify RN    Comment 2 Document in Chart   Glucose, capillary     Status: Abnormal   Collection Time: 12/25/16 12:31 PM  Result Value Ref Range   Glucose-Capillary 160 (H) 65 - 99 mg/dL   Comment 1 Notify RN    Comment 2 Document in Chart   Glucose, capillary     Status: Abnormal   Collection Time: 12/25/16  5:39 PM  Result Value Ref Range   Glucose-Capillary 157 (H) 65 - 99 mg/dL   Comment 1 Notify RN    Comment 2 Document in Chart    Recent Results (from the past 240 hour(s))  Blood culture (routine x 2)     Status: None (Preliminary result)   Collection Time: 12/22/16  5:26 PM  Result Value Ref Range Status   Specimen Description BLOOD LEFT WRIST  Final   Special Requests   Final    BOTTLES DRAWN AEROBIC AND ANAEROBIC Blood Culture adequate volume   Culture   Final    NO GROWTH 2 DAYS Performed at [facility], [place] [address], [place], [place] [address]    Report Status PENDING  Incomplete  Blood  culture (routine x 2)     Status: None (Preliminary result)   Collection Time: 12/22/16  8:42 PM  Result Value Ref Range Status   Specimen Description BLOOD RIGHT ANTECUBITAL  Final   Special Requests   Final    BOTTLES DRAWN AEROBIC AND ANAEROBIC Blood Culture adequate volume   Culture   Final    NO GROWTH 2 DAYS Performed at [facility], [place] [address] [place], [place] [address]    Report Status PENDING  Incomplete   Creatinine:  Recent Labs  12/22/16 1717 12/23/16 0405 12/24/16 0354 12/25/16 0422  CREATININE 1.03* 0.97 0.78 0.84   Imaging: renal US personally reviewed from 9/26 FINDINGS: Right Kidney:  Length: 10.6 cm. There is moderate hydronephrosis. The renal cortical echotexture is increased and is greater than that of the adjacent liver. No calcified stones are observed.  Left Kidney:  Length: 8.3 cm. The renal cortical echotexture is similar to that of the right kidney. There is minimal hydronephrosis on the left. No stones are evident.  Bladder:  The partially distended urinary bladder is normal. A right ureteral jet is observed. No definite left ureteral jet was demonstrated.  IMPRESSION: Moderate right-sided hydronephrosis and minimal left-sided hydronephrosis. Increased renal cortical echotexture bilaterally compatible with medical renal disease.  Impression/Assessment:  Right hydronephrosis  Plan:  Patient is asymptomatic with normal creatinine. No urgent intervention necessary. However would recommend noncontrast CT of the abdomen and pelvis to rule out obstructive cause such as ureteral calculus.  Marton Redwood, III 12/25/2016, 6:35 PM

## 2016-12-25 NOTE — Progress Notes (Signed)
[facility] Progress Note  Patient: Cynthia Wright STM:[id number]   PCP: Aretta Nip, MD DOB: Oct 25, 1924   DOA: 12/22/2016   DOS: 12/25/2016   Date of Service: the patient was seen and examined on 12/25/2016  Subjective: breathing and cough has improved, no chest pain, urinating well.  Brief hospital course: Pt. with PMH of dementia, A. fib, CHF, type II DM, HTN; admitted on 12/22/2016, presented with complaint of fatigue and cough, was found to have healthcare associated pneumonia. Currently further plan is continue IV antibiotics.  Assessment and Plan: 1. HCAP  - Pt presents with increased confusion, lethargy, and productive cough, and is found to desaturate with minimal exertion and have suspected basilar PNA on CXR  - Blood cultures no growth in 2 days, - Continue empiric abx with azithromycin. Change to Ceftin.  - CT PE negative, patient has moderate pleural effusion.  2. Elevated LFT's  - Transaminases elevated to 150-range, recently normal  - Pt denies abd pain and abd exam benign; she does report vomiting  - No acute abnormality on the ultrasound liver. LFTs trending down.  Ultrasound abdomen unremarkable..  3. Paroxysmal atrial fibrillation  - In a sinus rhythm on admission  - Continue amiodarone and diltiazem  - CHADS-VASc is 10 (age x2, gender, HTN, DM, CHF) - Continue Eliquis   4. Acute on Chronic diastolic CHF  Appears to have acute on chronic CHF as a cause of her cough and acute hypoxia, Getting better with lasix, will continue for today.   5. Type II DM  - A1c was 6.1% in July '18  - Managed at home with Januvia, held on admission  - Check CBG with meals and qHS, start a low-intensity SSI with Novolog   6. Right-sided hydronephrosis.  Urinating well, and no renal failure no flank pain,  Pt will follow up with urology as outpatient for establish care and for further work up   Diet: Cardiac diet DVT Prophylaxis: on therapeutic  anticoagulation.  Advance goals of care discussion: Full code  Family Communication: family was present at bedside, at the time of interview. The pt provided permission to discuss medical plan with the family. Opportunity was given to ask question and all questions were answered satisfactorily.   Disposition:  Discharge to home likely tomorrow.  Consultants: none Procedures: none  Antibiotics: Anti-infectives    Start     Dose/Rate Route Frequency Ordered Stop   12/24/16 1000  ceFEPIme (MAXIPIME) 1 g in dextrose 5 % 50 mL IVPB  Status:  Discontinued     1 g 100 mL/hr over 30 Minutes Intravenous Every 24 hours 12/23/16 1000 12/23/16 1119   12/24/16 1000  cefTRIAXone (ROCEPHIN) 1 g in dextrose 5 % 50 mL IVPB  Status:  Discontinued     1 g 100 mL/hr over 30 Minutes Intravenous Every 24 hours 12/23/16 1822 12/25/16 1608   12/23/16 2200  vancomycin (VANCOCIN) IVPB 750 mg/150 ml premix  Status:  Discontinued     750 mg 150 mL/hr over 60 Minutes Intravenous Every 24 hours 12/23/16 0641 12/23/16 1119   12/23/16 2200  azithromycin (ZITHROMAX) tablet 500 mg     500 mg Oral Daily at bedtime 12/23/16 1817     12/23/16 0000  vancomycin (VANCOCIN) IVPB 750 mg/150 ml premix     750 mg 150 mL/hr over 60 Minutes Intravenous  Once 12/22/16 2349 12/23/16 0100   12/22/16 2230  ceFEPIme (MAXIPIME) 1 g in dextrose 5 % 50 mL IVPB  Status:  Discontinued     1 g 100 mL/hr over 30 Minutes Intravenous Every 12 hours 12/22/16 2217 12/23/16 1000   12/22/16 2015  cefTRIAXone (ROCEPHIN) 1 g in dextrose 5 % 50 mL IVPB     1 g 100 mL/hr over 30 Minutes Intravenous  Once 12/22/16 2007 12/22/16 2200   12/22/16 2015  azithromycin (ZITHROMAX) tablet 500 mg     500 mg Oral  Once 12/22/16 2007 12/22/16 2323       Objective: Physical Exam: Vitals:   12/24/16 1500 12/24/16 2000 12/25/16 0500 12/25/16 1605  BP: (!) 158/74 (!) 157/75 (!) 175/69 (!) 152/64  Pulse: 74 79 92 72  Resp: (!) 22 20 20 18   Temp: 98.2 F  (36.8 C) 98.4 F (36.9 C) 98.1 F (36.7 C) 98 F (36.7 C)  TempSrc: Oral Oral Oral Oral  SpO2: 95% 97% 98% 95%  Weight:      Height:        Intake/Output Summary (Last 24 hours) at 12/25/16 1609 Last data filed at 12/25/16 1245  Gross per 24 hour  Intake              550 ml  Output             1600 ml  Net            -1050 ml   Filed Weights   12/22/16 1702  Weight: 54.9 kg (121 lb)   General: Alert, Awake and Oriented to Time, Place and Person. Appear in mild distress, affect appropriate Eyes: PERRL, Conjunctiva normal ENT: Oral Mucosa clear moist. Neck: no JVD, no Abnormal Mass Or lumps Cardiovascular: S1 and S2 Present, no Murmur, Peripheral Pulses Present Respiratory: normal respiratory effort, Bilateral Air entry equal and Decreased, no use of accessory muscle, Clear to Auscultation, no Crackles, no wheezes Abdomen: Bowel Sound present, Soft and no tenderness, no hernia Skin: no redness, no Rash, no induration Extremities: no Pedal edema, no calf tenderness Neurologic: Grossly no focal neuro deficit. Bilaterally Equal motor strength  Data Reviewed: CBC:  Recent Labs Lab 12/22/16 1717 12/23/16 0405 12/24/16 0354 12/25/16 0422  WBC 9.4 8.3 9.6 8.7  NEUTROABS 8.8* 7.1 8.5* 6.6  HGB 11.9* 11.2* 11.8* 11.1*  HCT 36.0 33.8* 36.2 33.8*  MCV 99.2 99.1 99.7 98.5  PLT 210 230 245 106   Basic Metabolic Panel:  Recent Labs Lab 12/22/16 1717 12/23/16 0405 12/24/16 0354 12/25/16 0422  NA 134* 134* 135 137  K 4.0 4.2 4.1 3.8  CL 101 102 101 101  CO2 24 22 23 26   GLUCOSE 209* 160* 154* 111*  BUN 24* 21* 20 18  CREATININE 1.03* 0.97 0.78 0.84  CALCIUM 8.3* 8.1* 8.4* 7.9*  MG  --   --  1.9  --     Liver Function Tests:  Recent Labs Lab 12/22/16 1717 12/23/16 0405 12/24/16 0354 12/25/16 0422  AST 145* 130* 57* 46*  ALT 149* 142* 112* 80*  ALKPHOS 297* 257* 285* 219*  BILITOT 0.6 0.7 0.6 0.7  PROT 6.5 6.0* 6.1* 5.4*  ALBUMIN 3.0* 2.7* 2.8* 2.5*   No  results for input(s): LIPASE, AMYLASE in the last 168 hours. No results for input(s): AMMONIA in the last 168 hours. Coagulation Profile:  Recent Labs Lab 12/24/16 0354  INR 1.11   Cardiac Enzymes:  Recent Labs Lab 12/22/16 1717  TROPONINI <0.03   BNP (last 3 results) No results for input(s): PROBNP in the last 8760 hours. CBG:  Recent Labs Lab  12/24/16 1156 12/24/16 1813 12/24/16 2116 12/25/16 0744 12/25/16 1231  GLUCAP 160* 86 135* 124* 160*   Studies: No results found.  Scheduled Meds: . amiodarone  200 mg Oral Daily  . apixaban  2.5 mg Oral BID  . azithromycin  500 mg Oral QHS  . diltiazem  240 mg Oral Daily  . docusate sodium  200 mg Oral QHS  . furosemide  40 mg Intravenous Daily  . insulin aspart  0-15 Units Subcutaneous TID WC  . insulin aspart  0-5 Units Subcutaneous QHS  . iron polysaccharides  150 mg Oral Daily  . polyethylene glycol  17 g Oral Daily  . potassium chloride SA  20 mEq Oral Daily  . sodium chloride flush  3 mL Intravenous Q12H  . zolpidem  5 mg Oral QHS   Continuous Infusions: . sodium chloride     PRN Meds: sodium chloride, acetaminophen, albuterol, HYDROcodone-acetaminophen, ondansetron (ZOFRAN) IV, sodium chloride flush  Time spent: 35 minutes  Author: Berle Mull, MD [facility] Pager: [phone] 12/25/2016 4:09 PM  If 7PM-7AM, please contact night-coverage at [email or web address], password [facility]

## 2016-12-26 ENCOUNTER — Encounter (HOSPITAL_COMMUNITY): Payer: Self-pay | Admitting: Radiology

## 2016-12-26 ENCOUNTER — Inpatient Hospital Stay (HOSPITAL_COMMUNITY): Payer: Medicare Other

## 2016-12-26 LAB — BASIC METABOLIC PANEL
ANION GAP: 9 (ref 5–15)
BUN: 15 mg/dL (ref 6–20)
CO2: 28 mmol/L (ref 22–32)
Calcium: 8.1 mg/dL — ABNORMAL LOW (ref 8.9–10.3)
Chloride: 102 mmol/L (ref 101–111)
Creatinine, Ser: 0.69 mg/dL (ref 0.44–1.00)
GFR calc Af Amer: >60 mL/min (ref >60)
GFR calc non Af Amer: >60 mL/min (ref >60)
GLUCOSE: 133 mg/dL — AB (ref 65–99)
POTASSIUM: 3.5 mmol/L (ref 3.5–5.1)
Sodium: 139 mmol/L (ref 135–145)

## 2016-12-26 LAB — CBC
HEMATOCRIT: 32.5 % — AB (ref 36.0–46.0)
Hemoglobin: 10.7 g/dL — ABNORMAL LOW (ref 12.0–15.0)
MCH: 32.3 pg (ref 26.0–34.0)
MCHC: 32.9 g/dL (ref 30.0–36.0)
MCV: 98.2 fL (ref 78.0–100.0)
Platelets: 301 10*3/uL (ref 150–400)
RBC: 3.31 MIL/uL — AB (ref 3.87–5.11)
RDW: 14.6 % (ref 11.5–15.5)
WBC: 7.6 10*3/uL (ref 4.0–10.5)

## 2016-12-26 LAB — GLUCOSE, CAPILLARY: Glucose-Capillary: 126 mg/dL — ABNORMAL HIGH (ref 65–99)

## 2016-12-26 LAB — MAGNESIUM: Magnesium: 1.7 mg/dL (ref 1.7–2.4)

## 2016-12-26 LAB — LEGIONELLA PNEUMOPHILA SEROGP 1 UR AG: L. pneumophila Serogp 1 Ur Ag: NEGATIVE

## 2016-12-26 MED ORDER — IBUPROFEN 200 MG PO TABS: 200.0000 mg | ORAL_TABLET | Freq: Three times a day (TID) | ORAL | 0 refills | Status: DC | PRN

## 2016-12-26 MED ORDER — FUROSEMIDE 40 MG PO TABS: 40.0000 mg | ORAL_TABLET | Freq: Every day | ORAL | 0 refills | Status: AC

## 2016-12-26 NOTE — Discharge Instructions (Signed)
For Heart failure patients -  Check your Weight same time everyday If you gain over 2 pounds in 1 day or 5 Lbs in 2 days, or you develop in leg swelling, experience more shortness of breath or chest pain, call your Primary MD immediately.  Avoid adding extra salt in the food, food high in salt and Follow 1.5 lit/day fluid restriction.

## 2016-12-26 NOTE — Progress Notes (Signed)
Physical Therapy Treatment Patient Details Name: Cynthia Wright MRN: [id number] DOB: May 15, 1924 Today's Date: 12/26/2016    History of Present Illness 83 y.o. female with medical history significant for dementia, atrial fibrillation on  Eliquis, chronic diastolic CHF,  type 2 diabetes mellitus, and hypertension, now presenting to the emergency department for evaluation of productive cough, anorexia, increased confusion, and fatigue. Dx of PNA.    PT Comments    Pt progressing; may need incr supervision at home and HHPT if family feels pt not at baseline  Follow Up Recommendations  No PT follow up;Home health PT (vs --depending if pt at baseline(?))24hr supervision     Equipment Recommendations  None recommended by PT    Recommendations for Other Services       Precautions / Restrictions Precautions Precautions: Fall Precaution Comments: pt reports 2 falls in past 1 year Restrictions Weight Bearing Restrictions: No    Mobility  Bed Mobility Overal bed mobility: Needs Assistance Bed Mobility: Supine to Sit     Supine to sit: Min guard     General bed mobility comments: facilitation and verbal cues to complete task  Transfers   Equipment used: Rolling walker (2 wheeled) Transfers: Sit to/from Stand Sit to Stand: Min guard         General transfer comment: from bed and toilet, cues for safety/hand placement  Ambulation/Gait Ambulation/Gait assistance: Min guard Ambulation Distance (Feet): 15 Feet (x2) Assistive device: Rolling walker (2 wheeled) Gait Pattern/deviations: Step-through pattern;Decreased stride length     General Gait Details: no LOB although mildly unsteady initially; distance limited d/t pt requesting to amb to bathroom, then breakfast arrived   Stairs            Wheelchair Mobility    Modified Rankin (Stroke Patients Only)       Balance Overall balance assessment: History of Falls;Needs assistance Sitting-balance support:  Feet supported;No upper extremity supported       Standing balance support: No upper extremity supported;During functional activity Standing balance-Leahy Scale: Fair Standing balance comment: able to stand, perform pericare after toileting without LOB                            Cognition Arousal/Alertness: Awake/alert Behavior During Therapy: WFL for tasks assessed/performed Overall Cognitive Status: No family/caregiver present to determine baseline cognitive functioning (hx of dementia) Area of Impairment: Memory                     Memory: Decreased short-term memory                Exercises      General Comments        Pertinent Vitals/Pain      Home Living                      Prior Function            PT Goals (current goals can now be found in the care plan section) Acute Rehab PT Goals Patient Stated Goal: return home PT Goal Formulation: With patient Time For Goal Achievement: 01/08/17 Potential to Achieve Goals: Good Progress towards PT goals: Progressing toward goals    Frequency    Min 3X/week      PT Plan Current plan remains appropriate    Co-evaluation              AM-PAC PT "6 Clicks" Daily Activity  Outcome  Measure  Difficulty turning over in bed (including adjusting bedclothes, sheets and blankets)?: A Little Difficulty moving from lying on back to sitting on the side of the bed? : A Little Difficulty sitting down on and standing up from a chair with arms (e.g., wheelchair, bedside commode, etc,.)?: A Little Help needed moving to and from a bed to chair (including a wheelchair)?: A Little Help needed walking in hospital room?: A Little Help needed climbing 3-5 steps with a railing? : A Little 6 Click Score: 18    End of Session Equipment Utilized During Treatment: Gait belt Activity Tolerance: Patient tolerated treatment well Patient left: in chair;with call bell/phone within reach;with chair  alarm set;Other (comment) (telesys monitor) Nurse Communication: Mobility status PT Visit Diagnosis: History of falling (Z91.81);Difficulty in walking, not elsewhere classified (R26.2)     Time: 5053-9767 PT Time Calculation (min) (ACUTE ONLY): 21 min  Charges:  $Gait Training: 8-22 mins                    G CodesKenyon Ana, PT Pager: [phone] 12/26/2016    Kenyon Ana 12/26/2016, 10:40 AM

## 2016-12-26 NOTE — Progress Notes (Signed)
This CM met with pt at bedside to confirm DC plan. Pt states she plans to dc with niece to her home. Pt states she would like to resume home health services with [facility]. MD to write resumption orders and [facility] alerted of DC plan. Marney Doctor RN,BSN,NCM [phone]

## 2016-12-28 DIAGNOSIS — I482 Chronic atrial fibrillation: Secondary | ICD-10-CM | POA: Diagnosis not present

## 2016-12-28 DIAGNOSIS — E119 Type 2 diabetes mellitus without complications: Secondary | ICD-10-CM | POA: Diagnosis not present

## 2016-12-28 DIAGNOSIS — I110 Hypertensive heart disease with heart failure: Secondary | ICD-10-CM | POA: Diagnosis not present

## 2016-12-28 DIAGNOSIS — S72001D Fracture of unspecified part of neck of right femur, subsequent encounter for closed fracture with routine healing: Secondary | ICD-10-CM | POA: Diagnosis not present

## 2016-12-28 DIAGNOSIS — F0390 Unspecified dementia without behavioral disturbance: Secondary | ICD-10-CM | POA: Diagnosis not present

## 2016-12-28 DIAGNOSIS — I5032 Chronic diastolic (congestive) heart failure: Secondary | ICD-10-CM | POA: Diagnosis not present

## 2016-12-28 LAB — CULTURE, BLOOD (ROUTINE X 2)
Culture: NO GROWTH
Culture: NO GROWTH
SPECIAL REQUESTS: ADEQUATE
Special Requests: ADEQUATE

## 2016-12-29 NOTE — Discharge Summary (Signed)
[facility] Discharge Summary   Patient: Cynthia Wright:[id number]   PCP: Aretta Nip, MD DOB: [birth year]   Date of admission: 12/22/2016   Date of discharge: 12/26/2016   Discharge Diagnoses:  Principal Problem:   Type 2 diabetes mellitus with hyperlipidemia ([place]) Active Problems:   PAF (paroxysmal atrial fibrillation) ([facility])   Benign essential HTN   Chronic diastolic CHF (congestive heart failure) ([place])   Dementia with behavioral disturbance   HCAP (healthcare-associated pneumonia)   Elevated LFTs  Admitted From: home Disposition:  Home with home health  Recommendations for Outpatient Follow-up:  1. Please follow-up with PCP in 1-2 weeks   Follow-up Information    [facility].   Why:  Home health physical therapy and nursing services. Contact information: [address] [place] [address] [phone]        Aretta Nip, MD. Schedule an appointment as soon as possible for a visit in 1 week(s).   Specialty:  Family Medicine Contact information: [place] [address] [phone]        [place]. Schedule an appointment as soon as possible for a visit in 2 week(s).   Why:  new pt. discussed with Dr Gloriann Loan and Dr Tresa Moore. thanks.  Contact information: [place] [phone]         Diet recommendation: cardiac diet carb modified  Activity: The patient is advised to gradually reintroduce usual activities.  Discharge Condition: good  Code Status: full code  History of present illness: As per the H and P dictated on admission, "Cynthia Wright is a 83 y.o. female with medical history significant for dementia, atrial fibrillation on  Eliquis, chronic diastolic CHF,  type 2 diabetes mellitus, and hypertension, now presenting to the emergency department for evaluation of productive cough, anorexia, increased confusion,  and fatigue. Patient is accompanied by family who assist with the history. She had reportedly been increasingly confused over the past several days, saw her PCP for this, was diagnosed with UTI, and started on Bactrim. She has also developed a cough over the past 3 days which worsened significantly today. She has been dyspneic with minimal exertion. She reports some associated nausea with couple episodes of vomiting. Denies fevers. Denies chest pain. Reports that her chronic bilateral lower extremity edema appears stable. She reports having orthopnea over the past few days.   ED Course: Upon arrival to the ED, patient is found to be afebrile, saturating adequately on room air while at rest, tachypneic, and with vitals otherwise stable. EKG features a sinus rhythm with nonspecific ST abnormality in the anterolateral leads. Chest x-ray is notable for small bilateral pleural effusions and patchy airspace opacities in the bases concerning for pneumonia versus atelectasis. Chemistry panel reveals a mild hyponatremia, stable creatinine of 1.03, glucose 209, and elevation in LFTs with alkaline phosphatase 297, AST 145, and ALT 149. LFTs were recently normal. CBC is unremarkable. Troponin is undetectable. Lactic acid is reassuring at 1.02. Blood cultures were collected and the patient was treated with 1 L of normal saline and empiric Rocephin and azithromycin. She remained hemodynamically stable and in no acute distress while at rest, but desaturates to the mid 80s with minimal exertion, and will be admitted to the medical surgical unit for ongoing evaluation and management of HCAP. "  Hospital Course:  Summary of her active problems in the hospital is as following. 1. HCAP ruled out -  Pt presents with increased confusion, lethargy, and productive cough, and is found to desaturate with minimal exertion and have suspected basilar PNA on CXR  - Blood cultures no growth in 2 days, - treated with broad-spectrum IV  antibiotics.althoughwith no evidence of active infection after that no further antibiotics are indicated as her symptoms are explained by acute on chronic CHF - CT PE negative, patient has moderate pleural effusion.  2. Elevated LFT's  - Transaminases elevated to 150-range, recently normal  - Pt denies abd pain and abd exam benign; she does report vomiting  - No acute abnormality on the ultrasound liver. LFTs trending down.  Ultrasound abdomen unremarkable..  3. Paroxysmal atrial fibrillation  - In a sinus rhythm on admission  - Continue amiodarone and diltiazem  - CHADS-VASc is 6 (age x2, gender, HTN, DM, CHF) - Continue Eliquis   4. Acute on Chronic diastolic CHF  Appears to have acute on chronic CHF as a cause of her cough and acute hypoxia, Getting better with lasix, will continue oral Lasix  5. Type II DM  - A1c was 6.1% in July '18  - Managed at home with Januvia, held on admission  - Check CBG with meals and qHS, start a low-intensity SSI with Novolog   6. Right-sided hydronephrosis.appears congenital Urinating well, and no renal failure no flank pain,  Pt will follow up with urology as outpatient for establish care and for further work up   All other chronic medical condition were stable during the hospitalization.  Patient was seen by physical therapy, who recommended home health, which was arranged by Education officer, museum and case Freight forwarder. On the day of the discharge the patient's vitals were stable, and no other acute medical condition were reported by patient. the patient was felt safe to be discharge at home with home health.  Procedures and Results:  none   Consultations:  nonoe  DISCHARGE MEDICATION: Discharge Medication List as of 12/26/2016 12:13 PM    CONTINUE these medications which have CHANGED   Details  furosemide (LASIX) 40 MG tablet Take 1 tablet (40 mg total) by mouth daily., Starting Sat 12/27/2016, Normal    ibuprofen (ADVIL,MOTRIN) 200 MG tablet  Take 1 tablet (200 mg total) by mouth every 8 (eight) hours as needed., Starting Fri 12/26/2016, Normal      CONTINUE these medications which have NOT CHANGED   Details  amiodarone (PACERONE) 200 MG tablet Take 1 tablet (200 mg total) by mouth daily., Starting Mon 10/20/2016, Print    diltiazem (CARDIZEM CD) 240 MG 24 hr capsule Take 1 capsule (240 mg total) by mouth daily., Starting Tue 12/16/2016, Until Mon 03/16/2017, Normal    docusate sodium (COLACE) 100 MG capsule Take 200 mg by mouth at bedtime. , Historical Med    donepezil (ARICEPT) 10 MG tablet Take 10 mg by mouth at bedtime., Historical Med    ELIQUIS 2.5 MG TABS tablet TAKE 1 TABLET (2.5 MG TOTAL) BY MOUTH 2 (TWO) TIMES DAILY., Starting Mon 04/21/2016, Normal    iron polysaccharides (NIFEREX) 150 MG capsule Take 1 capsule (150 mg total) by mouth daily., Starting Tue 09/30/2016, No Print    Melatonin 10 MG TABS Take 30 mg by mouth at bedtime., Historical Med    polyethylene glycol (MIRALAX / GLYCOLAX) packet Take 17 g by mouth daily., Historical Med    potassium chloride SA (KLOR-CON M20) 20 MEQ tablet Take 2 tablets (40 mEq total) by mouth every other day., Starting Fri 05/23/2016, Normal  rosuvastatin (CRESTOR) 5 MG tablet Take 5 mg by mouth at bedtime. , Historical Med    sitaGLIPtin (JANUVIA) 100 MG tablet Take 100 mg by mouth daily., Historical Med      STOP taking these medications     sulfamethoxazole-trimethoprim (BACTRIM DS,SEPTRA DS) 800-160 MG tablet        Allergies  Allergen Reactions  . Codeine Nausea And Vomiting  . Lortab [Hydrocodone-Acetaminophen] Nausea And Vomiting   Discharge Instructions    Ambulatory referral to Urology    Complete by:  As directed    Diet - low sodium heart healthy    Complete by:  As directed    Discharge instructions    Complete by:  As directed    It is important that you read following instructions as well as go over your medication list with RN to help you understand  your care after this hospitalization.  Discharge Instructions: Please follow-up with PCP in one week  Please request your primary care physician to go over all Hospital Tests and Procedure/Radiological results at the follow up,  Please get all Hospital records sent to your PCP by signing hospital release before you go home.   Do not take more than prescribed Pain, Sleep and Anxiety Medications. You were cared for by a hospitalist during your hospital stay. If you have any questions about your discharge medications or the care you received while you were in the hospital after you are discharged, you can call the unit and ask to speak with the hospitalist on call if the hospitalist that took care of you is not available.  Once you are discharged, your primary care physician will handle any further medical issues. Please note that NO REFILLS for any discharge medications will be authorized once you are discharged, as it is imperative that you return to your primary care physician (or establish a relationship with a primary care physician if you do not have one) for your aftercare needs so that they can reassess your need for medications and monitor your lab values. You Must read complete instructions/literature along with all the possible adverse reactions/side effects for all the Medicines you take and that have been prescribed to you. Take any new Medicines after you have completely understood and accept all the possible adverse reactions/side effects. Wear Seat belts while driving. If you have smoked or chewed Tobacco in the last 2 yrs please stop smoking and/or stop any Recreational drug use.   Increase activity slowly    Complete by:  As directed      Discharge Exam: Filed Weights   12/22/16 1702  Weight: 54.9 kg (121 lb)   Vitals:   12/25/16 1953 12/26/16 0538  BP: (!) 154/84 (!) 141/79  Pulse: 92 89  Resp: 18 18  Temp: 97.7 F (36.5 C) 98.4 F (36.9 C)  SpO2: 96% 95%   General:  Appear in no distress, no Rash; Oral Mucosa moist Cardiovascular: S1 and S2 Present, aortic systolic Murmur, no JVD Respiratory: Bilateral Air entry present and Clear to Auscultation, no Crackles, no wheezes Abdomen: Bowel Sound present, Soft and no tenderness Extremities: no Pedal edema, no calf tenderness Neurology: Grossly no focal neuro deficit.  The results of significant diagnostics from this hospitalization (including imaging, microbiology, ancillary and laboratory) are listed below for reference.    Significant Diagnostic Studies: Dg Chest 1 View  Result Date: 12/24/2016 CLINICAL DATA:  Respiratory distress EXAM: CHEST 1 VIEW COMPARISON:  12/22/2016 chest radiograph. FINDINGS: Stable cardiomediastinal silhouette with  mild cardiomegaly and aortic atherosclerosis. No pneumothorax. Small bilateral pleural effusions, increased on the right and stable on left. Increased hazy and linear parahilar opacities in both lungs. Stable left retrocardiac opacity. IMPRESSION: 1. Stable mild cardiomegaly with increased hazy and linear bilateral parahilar lung opacities, favor worsening mild-to-moderate pulmonary edema due to congestive heart failure. 2. Small bilateral pleural effusions, increased on the right and stable on the left . 3. Stable left retrocardiac opacity, favor atelectasis. Electronically Signed   By: Ilona Sorrel M.D.   On: 12/24/2016 09:02   Dg Chest 2 View  Result Date: 12/22/2016 CLINICAL DATA:  Cough x3 days with fever. EXAM: CHEST  2 VIEW COMPARISON:  10/14/2016 CXR FINDINGS: The heart size and mediastinal contours are within normal limits. Mild-to-moderate atherosclerosis of the aortic arch. No aneurysm. Small to moderate layering pleural effusions left greater than right. Patchy atelectasis and/or pneumonia are believed to account for patchy airspace opacities at each lung base. 6 x 3 mm ovoid nodular appearing density in the right upper lobe may represent a small pulmonary nodule or  vascular shadow. This was not apparent on the recent comparison study. IMPRESSION: 1. Left greater than right small to moderate pleural effusions. 2. Patchy airspace opacities at the lung bases suspicious for atelectasis and/or pneumonia. 3. Aortic atherosclerosis. Electronically Signed   By: Ashley Royalty M.D.   On: 12/22/2016 18:17   Ct Angio Chest Pe W Or Wo Contrast  Result Date: 12/24/2016 CLINICAL DATA:  Pleural effusion; Hypoxemia or resp failure, unknown cause EXAM: CT ANGIOGRAPHY CHEST WITH CONTRAST TECHNIQUE: Multidetector CT imaging of the chest was performed using the standard protocol during bolus administration of intravenous contrast. Multiplanar CT image reconstructions and MIPs were obtained to evaluate the vascular anatomy. CONTRAST:  100 mL of Isovue 370 intravenous contrast COMPARISON:  Current chest radiograph. FINDINGS: Cardiovascular: Satisfactory opacification of the pulmonary arteries to the segmental level. No evidence of pulmonary embolism. Heart is mildly enlarged. Moderate three-vessel coronary artery calcifications. No pericardial effusion. Thoracic aortic atherosclerotic calcifications along the arch and descending portion. No evidence of a dissection or aneurysm. Mediastinum/Nodes: Thyroid mildly enlarged, most notably the right lobe. A nodular extension from the right lobe projects just below the sternal notch. No convincing neck base or axillary masses or enlarged lymph nodes. There are prominent mediastinal lymph nodes, none of which are pathologically enlarged by size criteria. No hilar masses or enlarged lymph nodes. The trachea is widely patent. Esophagus is unremarkable. Lungs/Pleura: Moderate right and moderate to large left pleural effusions. There are patchy bilateral areas of peribronchovascular and peripheral ground-glass type opacity, most evident in the upper lobes. Dependent opacity in the lower lobes adjacent to the pleural effusions is consistent with atelectasis.  Some fluid tracks along the left oblique fissure. No pneumothorax. No lung mass or suspicious nodule. Upper Abdomen: Multiple low-density liver lesions are noted, largest in the left lobe lateral segment measuring 5.6 cm, with average Hounsfield units less than 10. These are all likely cysts. No acute findings in the visible upper abdomen. Musculoskeletal: There are mild compression fractures of T7 and T8 present on a lateral chest radiograph dated 12/22/2016, but new since a lateral chest radiograph dated 12/28/2012. No other fractures. No osteoblastic or osteolytic lesions. Review of the MIP images confirms the above findings. IMPRESSION: 1. No evidence of a pulmonary embolism. 2. Moderate to large left and moderate right pleural effusions with significant associated dependent lower lobe atelectasis. Mild cardiomegaly. 3. Patchy areas of predominantly peribronchovascular and peripheral  ground-glass opacity. Possible etiologies include infectious and inflammatory. Patchy pulmonary edema from congestive heart failure is also possible. Aortic Atherosclerosis (ICD10-I70.0). Electronically Signed   By: Lajean Manes M.D.   On: 12/24/2016 15:39   US Renal  Result Date: 12/24/2016 CLINICAL DATA:  Follow-up right-sided hydronephrosis demonstrated on abdominal ultrasound of December 23, 2016. EXAM: RENAL / URINARY TRACT ULTRASOUND COMPLETE COMPARISON:  Abdominal ultrasound of December 23, 2016 FINDINGS: Right Kidney: Length: 10.6 cm. There is moderate hydronephrosis. The renal cortical echotexture is increased and is greater than that of the adjacent liver. No calcified stones are observed. Left Kidney: Length: 8.3 cm. The renal cortical echotexture is similar to that of the right kidney. There is minimal hydronephrosis on the left. No stones are evident. Bladder: The partially distended urinary bladder is normal. A right ureteral jet is observed. No definite left ureteral jet was demonstrated. IMPRESSION: Moderate  right-sided hydronephrosis and minimal left-sided hydronephrosis. Increased renal cortical echotexture bilaterally compatible with medical renal disease. Electronically Signed   By: David  Martinique M.D.   On: 12/24/2016 13:00   Ct Renal Stone Study  Result Date: 12/26/2016 CLINICAL DATA:  Right-sided hydronephrosis with pain EXAM: CT ABDOMEN AND PELVIS WITHOUT CONTRAST TECHNIQUE: Multidetector CT imaging of the abdomen and pelvis was performed following the standard protocol without oral or intravenous contrast maternal administration. COMPARISON:  Renal ultrasound December 24, 2016 FINDINGS: Lower chest: There are moderate pleural effusions bilaterally with bibasilar lung consolidation. There are foci of coronary artery calcification present. Hepatobiliary: There are multiple cysts throughout the liver, ranging in size from less than 1 cm to as large as 5.4 x 4.1 cm. A slight amount of associated calcification is noted adjacent to cysts in the left lobe of the liver. There are a few small scattered liver calcifications, likely granulomas. No distortion of hepatic architecture is appreciable on noncontrast enhanced study. Gallbladder is absent. There is no biliary duct dilatation. Pancreas: There is no appreciable pancreatic mass or inflammatory focus. Spleen: No splenic lesions are evident. Adrenals/Urinary Tract: Adrenals appear within normal limits. There is moderately severe hydronephrosis on the right. There is no hydronephrosis on the left. No renal masses are appreciable. There is no demonstrable renal or ureteral calculus on either side. The right ureter does not appear appreciably dilated. Urinary bladder is midline with wall thickness within normal limits. Stomach/Bowel: Rectum is distended with stool. There is rectal wall thickening as well as stranding in the perirectal fat. There is fairly diffuse stool throughout the colon overall. Apart from the rectum, there is no appreciable bowel wall  thickening. No mesenteric thickening appreciable. No bowel obstruction. No free air or portal venous air. VascularLlymphatic: There is atherosclerotic calcification in the aorta and iliac arteries. No aneurysm is appreciable. Major mesenteric vessels appear patent on this noncontrast enhanced study. No adenopathy is appreciable in the abdomen pelvis. There is a small presumed calcified mesenteric lymph node in the mid left pelvis. Reproductive:  Uterus absent.  No evident pelvic mass. Other: Appendix appears normal. No abscess. There is rather minimal ascites in the pelvis. No ascites elsewhere evident. Musculoskeletal: There is degenerative change in the lumbar spine with vacuum phenomenon at L4-5 and L5-S1. There is marked disc space narrowing at L5-S1. There is moderate spinal stenosis at L4-5 due to diffuse disc protrusion and bony hypertrophy. There is an old healed fracture of the right ischium. There is a total hip replacement on the right. Prosthesis appears well-seated. 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 No blastic or lytic bone lesions. No intramuscular or abdominal wall lesion. IMPRESSION: 1. Moderately severe hydronephrosis on the right without right-sided ureterectasis. No renal or ureteral calculus on the right. Question focal stenosis at the right ureteropelvic junction. Pyelonephritis potentially could be a consideration in this case. No renal abscess noted, however. This finding may warrant retrograde pyelography to further evaluate the right ureteropelvic junction region. 2. Rather marked distention of the rectum with rectal wall thickening and perirectal fat and soft tissue stranding. This appearance is felt to be due to stercoral proctitis. Note that there is fairly diffuse stool elsewhere in the colon. No bowel obstruction. 3. No abscess in the abdomen or pelvis. Rather minimal ascites in the pelvis of uncertain etiology. 4. Aortoiliac atherosclerosis. There are foci of  coronary artery calcification. 5.  Multiple liver cysts which have a benign appearance. 6. Moderate pleural effusions bilaterally with bibasilar atelectasis/consolidation. 7.  Moderate spinal stenosis at L4-5, multifactorial. 8.  Total hip replacement on the right. 9.  Gallbladder and uterus absent. Aortic Atherosclerosis (ICD10-I70.0). Electronically Signed   By: Lowella Grip III M.D.   On: 12/26/2016 08:39   US Abdomen Limited Ruq  Result Date: 12/23/2016 CLINICAL DATA:  Elevated liver function tests. EXAM: ULTRASOUND ABDOMEN LIMITED RIGHT UPPER QUADRANT COMPARISON:  December 16, 2012 FINDINGS: Gallbladder: Status post prior cholecystectomy. Common bile duct: Diameter: 7.3 mm. Liver: There are multiple cystic areas identified throughout the liver unchanged compared to prior ultrasound. There is a 3.3 x 3.5 cm hyperechoic mass in the liver unchanged compared to prior ultrasound. Portal vein is patent on color Doppler imaging with normal direction of blood flow towards the liver. There is incidental finding of a right pleural effusion. There is incidental finding of right hydronephrosis. IMPRESSION: Stable cysts and stable hyperechoic mass in the liver unchanged compare with prior ultrasound of 2014. Incidental finding of right hydronephrosis and right pleural effusion. Prior cholecystectomy. Electronically Signed   By: Abelardo Diesel M.D.   On: 12/23/2016 17:42    Microbiology: Recent Results (from the past 240 hour(s))  Blood culture (routine x 2)     Status: None   Collection Time: 12/22/16  5:26 PM  Result Value Ref Range Status   Specimen Description BLOOD LEFT WRIST  Final   Special Requests   Final    BOTTLES DRAWN AEROBIC AND ANAEROBIC Blood Culture adequate volume   Culture   Final    NO GROWTH 5 DAYS Performed at [facility], [address] [place], [place] [address]    Report Status 12/28/2016 FINAL  Final  Blood culture (routine x 2)     Status: None   Collection Time:  12/22/16  8:42 PM  Result Value Ref Range Status   Specimen Description BLOOD RIGHT ANTECUBITAL  Final   Special Requests   Final    BOTTLES DRAWN AEROBIC AND ANAEROBIC Blood Culture adequate volume   Culture   Final    NO GROWTH 5 DAYS Performed at [facility], [place] [address] [place], [place] [address]    Report Status 12/28/2016 FINAL  Final     Labs: CBC:  Recent Labs Lab 12/22/16 1717 12/23/16 0405 12/24/16 0354 12/25/16 0422 12/26/16 0347  WBC 9.4 8.3 9.6 8.7 7.6  NEUTROABS 8.8* 7.1 8.5* 6.6  --   HGB 11.9* 11.2* 11.8* 11.1* 10.7*  HCT 36.0 33.8* 36.2 33.8* 32.5*  MCV 99.2 99.1 99.7 98.5 98.2  PLT 210 230 245 274 301  Basic Metabolic Panel:  Recent Labs Lab 12/22/16 1717 12/23/16 0405 12/24/16 0354 12/25/16 0422 12/26/16 0347  NA 134* 134* 135 137 139  K 4.0 4.2 4.1 3.8 3.5  CL 101 102 101 101 102  CO2 24 22 23 26 28   GLUCOSE 209* 160* 154* 111* 133*  BUN 24* 21* 20 18 15   CREATININE 1.03* 0.97 0.78 0.84 0.69  CALCIUM 8.3* 8.1* 8.4* 7.9* 8.1*  MG  --   --  1.9  --  1.7   Liver Function Tests:  Recent Labs Lab 12/22/16 1717 12/23/16 0405 12/24/16 0354 12/25/16 0422  AST 145* 130* 57* 46*  ALT 149* 142* 112* 80*  ALKPHOS 297* 257* 285* 219*  BILITOT 0.6 0.7 0.6 0.7  PROT 6.5 6.0* 6.1* 5.4*  ALBUMIN 3.0* 2.7* 2.8* 2.5*   No results for input(s): LIPASE, AMYLASE in the last 168 hours. No results for input(s): AMMONIA in the last 168 hours. Cardiac Enzymes:  Recent Labs Lab 12/22/16 1717  TROPONINI <0.03   BNP (last 3 results)  Recent Labs  10/14/16 1340  BNP 238.8*   CBG:  Recent Labs Lab 12/25/16 0744 12/25/16 1231 12/25/16 1739 12/25/16 2141 12/26/16 0859  GLUCAP 124* 160* 157* 111* 126*   Time spent: 35 minutes  Signed:  Kailen Name  [facility] 12/26/2016 , 8:24 AM

## 2016-12-31 ENCOUNTER — Telehealth (INDEPENDENT_AMBULATORY_CARE_PROVIDER_SITE_OTHER): Payer: Self-pay

## 2016-12-31 DIAGNOSIS — F0390 Unspecified dementia without behavioral disturbance: Secondary | ICD-10-CM | POA: Diagnosis not present

## 2016-12-31 DIAGNOSIS — E119 Type 2 diabetes mellitus without complications: Secondary | ICD-10-CM | POA: Diagnosis not present

## 2016-12-31 DIAGNOSIS — I110 Hypertensive heart disease with heart failure: Secondary | ICD-10-CM | POA: Diagnosis not present

## 2016-12-31 DIAGNOSIS — I482 Chronic atrial fibrillation: Secondary | ICD-10-CM | POA: Diagnosis not present

## 2016-12-31 DIAGNOSIS — I5032 Chronic diastolic (congestive) heart failure: Secondary | ICD-10-CM | POA: Diagnosis not present

## 2016-12-31 DIAGNOSIS — S72001D Fracture of unspecified part of neck of right femur, subsequent encounter for closed fracture with routine healing: Secondary | ICD-10-CM | POA: Diagnosis not present

## 2016-12-31 NOTE — Telephone Encounter (Signed)
Patty with [facility] called and wanted to report that patient fell this morning. She was trying to back up to her chair, missed the chair and fell on her buttock. Having some right side weakness and slow to lift leg. Has hx of surgery on right hip. Contact for Patty [phone]

## 2017-01-01 DIAGNOSIS — S72001D Fracture of unspecified part of neck of right femur, subsequent encounter for closed fracture with routine healing: Secondary | ICD-10-CM | POA: Diagnosis not present

## 2017-01-01 DIAGNOSIS — F0390 Unspecified dementia without behavioral disturbance: Secondary | ICD-10-CM | POA: Diagnosis not present

## 2017-01-01 DIAGNOSIS — I5032 Chronic diastolic (congestive) heart failure: Secondary | ICD-10-CM | POA: Diagnosis not present

## 2017-01-01 DIAGNOSIS — E119 Type 2 diabetes mellitus without complications: Secondary | ICD-10-CM | POA: Diagnosis not present

## 2017-01-01 DIAGNOSIS — I482 Chronic atrial fibrillation: Secondary | ICD-10-CM | POA: Diagnosis not present

## 2017-01-01 DIAGNOSIS — I110 Hypertensive heart disease with heart failure: Secondary | ICD-10-CM | POA: Diagnosis not present

## 2017-01-01 NOTE — Telephone Encounter (Signed)
See message below °

## 2017-01-01 NOTE — Telephone Encounter (Signed)
Is she able to weight bear and walk.  If so, implant should be fine and not broken.  Would treat it with tylenol or advil.

## 2017-01-01 NOTE — Telephone Encounter (Signed)
Called Cynthia Wright to advise. Pt is able to weightbear.

## 2017-01-02 ENCOUNTER — Ambulatory Visit: Payer: Medicare Other

## 2017-01-02 DIAGNOSIS — I110 Hypertensive heart disease with heart failure: Secondary | ICD-10-CM | POA: Diagnosis not present

## 2017-01-02 DIAGNOSIS — F0390 Unspecified dementia without behavioral disturbance: Secondary | ICD-10-CM | POA: Diagnosis not present

## 2017-01-02 DIAGNOSIS — S72001D Fracture of unspecified part of neck of right femur, subsequent encounter for closed fracture with routine healing: Secondary | ICD-10-CM | POA: Diagnosis not present

## 2017-01-02 DIAGNOSIS — I482 Chronic atrial fibrillation: Secondary | ICD-10-CM | POA: Diagnosis not present

## 2017-01-02 DIAGNOSIS — I5032 Chronic diastolic (congestive) heart failure: Secondary | ICD-10-CM | POA: Diagnosis not present

## 2017-01-02 DIAGNOSIS — E119 Type 2 diabetes mellitus without complications: Secondary | ICD-10-CM | POA: Diagnosis not present

## 2017-01-05 DIAGNOSIS — R5381 Other malaise: Secondary | ICD-10-CM | POA: Diagnosis not present

## 2017-01-05 DIAGNOSIS — E440 Moderate protein-calorie malnutrition: Secondary | ICD-10-CM | POA: Diagnosis not present

## 2017-01-05 DIAGNOSIS — F331 Major depressive disorder, recurrent, moderate: Secondary | ICD-10-CM | POA: Diagnosis not present

## 2017-01-05 DIAGNOSIS — I509 Heart failure, unspecified: Secondary | ICD-10-CM | POA: Diagnosis not present

## 2017-01-06 DIAGNOSIS — F0390 Unspecified dementia without behavioral disturbance: Secondary | ICD-10-CM | POA: Diagnosis not present

## 2017-01-06 DIAGNOSIS — I5032 Chronic diastolic (congestive) heart failure: Secondary | ICD-10-CM | POA: Diagnosis not present

## 2017-01-06 DIAGNOSIS — I110 Hypertensive heart disease with heart failure: Secondary | ICD-10-CM | POA: Diagnosis not present

## 2017-01-06 DIAGNOSIS — E119 Type 2 diabetes mellitus without complications: Secondary | ICD-10-CM | POA: Diagnosis not present

## 2017-01-06 DIAGNOSIS — S72001D Fracture of unspecified part of neck of right femur, subsequent encounter for closed fracture with routine healing: Secondary | ICD-10-CM | POA: Diagnosis not present

## 2017-01-06 DIAGNOSIS — I482 Chronic atrial fibrillation: Secondary | ICD-10-CM | POA: Diagnosis not present

## 2017-01-08 DIAGNOSIS — I5032 Chronic diastolic (congestive) heart failure: Secondary | ICD-10-CM | POA: Diagnosis not present

## 2017-01-08 DIAGNOSIS — S72001D Fracture of unspecified part of neck of right femur, subsequent encounter for closed fracture with routine healing: Secondary | ICD-10-CM | POA: Diagnosis not present

## 2017-01-08 DIAGNOSIS — F0390 Unspecified dementia without behavioral disturbance: Secondary | ICD-10-CM | POA: Diagnosis not present

## 2017-01-08 DIAGNOSIS — I110 Hypertensive heart disease with heart failure: Secondary | ICD-10-CM | POA: Diagnosis not present

## 2017-01-08 DIAGNOSIS — I482 Chronic atrial fibrillation: Secondary | ICD-10-CM | POA: Diagnosis not present

## 2017-01-08 DIAGNOSIS — E119 Type 2 diabetes mellitus without complications: Secondary | ICD-10-CM | POA: Diagnosis not present

## 2017-01-09 DIAGNOSIS — I110 Hypertensive heart disease with heart failure: Secondary | ICD-10-CM | POA: Diagnosis not present

## 2017-01-09 DIAGNOSIS — I5032 Chronic diastolic (congestive) heart failure: Secondary | ICD-10-CM | POA: Diagnosis not present

## 2017-01-09 DIAGNOSIS — F0390 Unspecified dementia without behavioral disturbance: Secondary | ICD-10-CM | POA: Diagnosis not present

## 2017-01-09 DIAGNOSIS — I482 Chronic atrial fibrillation: Secondary | ICD-10-CM | POA: Diagnosis not present

## 2017-01-09 DIAGNOSIS — S72001D Fracture of unspecified part of neck of right femur, subsequent encounter for closed fracture with routine healing: Secondary | ICD-10-CM | POA: Diagnosis not present

## 2017-01-09 DIAGNOSIS — E119 Type 2 diabetes mellitus without complications: Secondary | ICD-10-CM | POA: Diagnosis not present

## 2017-01-13 DIAGNOSIS — Z111 Encounter for screening for respiratory tuberculosis: Secondary | ICD-10-CM | POA: Diagnosis not present

## 2017-01-13 DIAGNOSIS — I509 Heart failure, unspecified: Secondary | ICD-10-CM | POA: Diagnosis not present

## 2017-01-13 DIAGNOSIS — Z022 Encounter for examination for admission to residential institution: Secondary | ICD-10-CM | POA: Diagnosis not present

## 2017-01-13 DIAGNOSIS — I480 Paroxysmal atrial fibrillation: Secondary | ICD-10-CM | POA: Diagnosis not present

## 2017-01-13 DIAGNOSIS — F331 Major depressive disorder, recurrent, moderate: Secondary | ICD-10-CM | POA: Diagnosis not present

## 2017-01-13 DIAGNOSIS — E119 Type 2 diabetes mellitus without complications: Secondary | ICD-10-CM | POA: Diagnosis not present

## 2017-01-14 DIAGNOSIS — F0390 Unspecified dementia without behavioral disturbance: Secondary | ICD-10-CM | POA: Diagnosis not present

## 2017-01-14 DIAGNOSIS — S72001D Fracture of unspecified part of neck of right femur, subsequent encounter for closed fracture with routine healing: Secondary | ICD-10-CM | POA: Diagnosis not present

## 2017-01-14 DIAGNOSIS — I5032 Chronic diastolic (congestive) heart failure: Secondary | ICD-10-CM | POA: Diagnosis not present

## 2017-01-14 DIAGNOSIS — E119 Type 2 diabetes mellitus without complications: Secondary | ICD-10-CM | POA: Diagnosis not present

## 2017-01-14 DIAGNOSIS — I482 Chronic atrial fibrillation: Secondary | ICD-10-CM | POA: Diagnosis not present

## 2017-01-14 DIAGNOSIS — I110 Hypertensive heart disease with heart failure: Secondary | ICD-10-CM | POA: Diagnosis not present

## 2017-01-15 DIAGNOSIS — S72001D Fracture of unspecified part of neck of right femur, subsequent encounter for closed fracture with routine healing: Secondary | ICD-10-CM | POA: Diagnosis not present

## 2017-01-15 DIAGNOSIS — E119 Type 2 diabetes mellitus without complications: Secondary | ICD-10-CM | POA: Diagnosis not present

## 2017-01-15 DIAGNOSIS — F0390 Unspecified dementia without behavioral disturbance: Secondary | ICD-10-CM | POA: Diagnosis not present

## 2017-01-15 DIAGNOSIS — I482 Chronic atrial fibrillation: Secondary | ICD-10-CM | POA: Diagnosis not present

## 2017-01-15 DIAGNOSIS — I110 Hypertensive heart disease with heart failure: Secondary | ICD-10-CM | POA: Diagnosis not present

## 2017-01-15 DIAGNOSIS — I5032 Chronic diastolic (congestive) heart failure: Secondary | ICD-10-CM | POA: Diagnosis not present

## 2017-01-16 DIAGNOSIS — I482 Chronic atrial fibrillation: Secondary | ICD-10-CM | POA: Diagnosis not present

## 2017-01-16 DIAGNOSIS — S72001D Fracture of unspecified part of neck of right femur, subsequent encounter for closed fracture with routine healing: Secondary | ICD-10-CM | POA: Diagnosis not present

## 2017-01-16 DIAGNOSIS — F0390 Unspecified dementia without behavioral disturbance: Secondary | ICD-10-CM | POA: Diagnosis not present

## 2017-01-16 DIAGNOSIS — I110 Hypertensive heart disease with heart failure: Secondary | ICD-10-CM | POA: Diagnosis not present

## 2017-01-16 DIAGNOSIS — E119 Type 2 diabetes mellitus without complications: Secondary | ICD-10-CM | POA: Diagnosis not present

## 2017-01-16 DIAGNOSIS — I5032 Chronic diastolic (congestive) heart failure: Secondary | ICD-10-CM | POA: Diagnosis not present

## 2017-01-20 ENCOUNTER — Ambulatory Visit (INDEPENDENT_AMBULATORY_CARE_PROVIDER_SITE_OTHER): Payer: Medicare Other | Admitting: Orthopaedic Surgery

## 2017-01-21 DIAGNOSIS — F0390 Unspecified dementia without behavioral disturbance: Secondary | ICD-10-CM | POA: Diagnosis not present

## 2017-01-21 DIAGNOSIS — I5032 Chronic diastolic (congestive) heart failure: Secondary | ICD-10-CM | POA: Diagnosis not present

## 2017-01-21 DIAGNOSIS — I110 Hypertensive heart disease with heart failure: Secondary | ICD-10-CM | POA: Diagnosis not present

## 2017-01-21 DIAGNOSIS — S72001D Fracture of unspecified part of neck of right femur, subsequent encounter for closed fracture with routine healing: Secondary | ICD-10-CM | POA: Diagnosis not present

## 2017-01-21 DIAGNOSIS — I482 Chronic atrial fibrillation: Secondary | ICD-10-CM | POA: Diagnosis not present

## 2017-01-21 DIAGNOSIS — E119 Type 2 diabetes mellitus without complications: Secondary | ICD-10-CM | POA: Diagnosis not present

## 2017-01-22 DIAGNOSIS — I482 Chronic atrial fibrillation: Secondary | ICD-10-CM | POA: Diagnosis not present

## 2017-01-22 DIAGNOSIS — I5032 Chronic diastolic (congestive) heart failure: Secondary | ICD-10-CM | POA: Diagnosis not present

## 2017-01-22 DIAGNOSIS — E119 Type 2 diabetes mellitus without complications: Secondary | ICD-10-CM | POA: Diagnosis not present

## 2017-01-22 DIAGNOSIS — F0390 Unspecified dementia without behavioral disturbance: Secondary | ICD-10-CM | POA: Diagnosis not present

## 2017-01-22 DIAGNOSIS — S72001D Fracture of unspecified part of neck of right femur, subsequent encounter for closed fracture with routine healing: Secondary | ICD-10-CM | POA: Diagnosis not present

## 2017-01-22 DIAGNOSIS — I110 Hypertensive heart disease with heart failure: Secondary | ICD-10-CM | POA: Diagnosis not present

## 2017-01-27 DIAGNOSIS — S72001D Fracture of unspecified part of neck of right femur, subsequent encounter for closed fracture with routine healing: Secondary | ICD-10-CM | POA: Diagnosis not present

## 2017-01-27 DIAGNOSIS — I5032 Chronic diastolic (congestive) heart failure: Secondary | ICD-10-CM | POA: Diagnosis not present

## 2017-01-27 DIAGNOSIS — I482 Chronic atrial fibrillation: Secondary | ICD-10-CM | POA: Diagnosis not present

## 2017-01-27 DIAGNOSIS — I110 Hypertensive heart disease with heart failure: Secondary | ICD-10-CM | POA: Diagnosis not present

## 2017-01-27 DIAGNOSIS — E119 Type 2 diabetes mellitus without complications: Secondary | ICD-10-CM | POA: Diagnosis not present

## 2017-01-27 DIAGNOSIS — F0390 Unspecified dementia without behavioral disturbance: Secondary | ICD-10-CM | POA: Diagnosis not present

## 2017-01-29 DIAGNOSIS — I482 Chronic atrial fibrillation: Secondary | ICD-10-CM | POA: Diagnosis not present

## 2017-01-29 DIAGNOSIS — F0390 Unspecified dementia without behavioral disturbance: Secondary | ICD-10-CM | POA: Diagnosis not present

## 2017-01-29 DIAGNOSIS — E119 Type 2 diabetes mellitus without complications: Secondary | ICD-10-CM | POA: Diagnosis not present

## 2017-01-29 DIAGNOSIS — I110 Hypertensive heart disease with heart failure: Secondary | ICD-10-CM | POA: Diagnosis not present

## 2017-01-29 DIAGNOSIS — I5032 Chronic diastolic (congestive) heart failure: Secondary | ICD-10-CM | POA: Diagnosis not present

## 2017-01-29 DIAGNOSIS — S72001D Fracture of unspecified part of neck of right femur, subsequent encounter for closed fracture with routine healing: Secondary | ICD-10-CM | POA: Diagnosis not present

## 2017-01-30 DIAGNOSIS — I10 Essential (primary) hypertension: Secondary | ICD-10-CM | POA: Diagnosis not present

## 2017-01-30 DIAGNOSIS — E785 Hyperlipidemia, unspecified: Secondary | ICD-10-CM | POA: Diagnosis not present

## 2017-01-30 DIAGNOSIS — R32 Unspecified urinary incontinence: Secondary | ICD-10-CM | POA: Diagnosis not present

## 2017-01-30 DIAGNOSIS — I482 Chronic atrial fibrillation: Secondary | ICD-10-CM | POA: Diagnosis not present

## 2017-01-30 DIAGNOSIS — E46 Unspecified protein-calorie malnutrition: Secondary | ICD-10-CM | POA: Diagnosis not present

## 2017-01-30 DIAGNOSIS — I5030 Unspecified diastolic (congestive) heart failure: Secondary | ICD-10-CM | POA: Diagnosis not present

## 2017-01-30 DIAGNOSIS — F339 Major depressive disorder, recurrent, unspecified: Secondary | ICD-10-CM | POA: Diagnosis not present

## 2017-01-30 DIAGNOSIS — R269 Unspecified abnormalities of gait and mobility: Secondary | ICD-10-CM | POA: Diagnosis not present

## 2017-01-30 DIAGNOSIS — F0150 Vascular dementia without behavioral disturbance: Secondary | ICD-10-CM | POA: Diagnosis not present

## 2017-01-30 DIAGNOSIS — R296 Repeated falls: Secondary | ICD-10-CM | POA: Diagnosis not present

## 2017-01-30 DIAGNOSIS — K5900 Constipation, unspecified: Secondary | ICD-10-CM | POA: Diagnosis not present

## 2017-01-30 DIAGNOSIS — E119 Type 2 diabetes mellitus without complications: Secondary | ICD-10-CM | POA: Diagnosis not present

## 2017-02-02 ENCOUNTER — Encounter (INDEPENDENT_AMBULATORY_CARE_PROVIDER_SITE_OTHER): Payer: Self-pay | Admitting: Orthopaedic Surgery

## 2017-02-02 ENCOUNTER — Ambulatory Visit (INDEPENDENT_AMBULATORY_CARE_PROVIDER_SITE_OTHER): Payer: Medicare Other | Admitting: Orthopaedic Surgery

## 2017-02-02 ENCOUNTER — Ambulatory Visit (INDEPENDENT_AMBULATORY_CARE_PROVIDER_SITE_OTHER): Payer: Medicare Other

## 2017-02-02 DIAGNOSIS — S72001A Fracture of unspecified part of neck of right femur, initial encounter for closed fracture: Secondary | ICD-10-CM

## 2017-02-02 NOTE — Progress Notes (Signed)
Office Visit Note   Patient: Cynthia Wright           Date of Birth: 1924-11-24           MRN: [id number] Visit Date: 02/02/2017              Requested by: Aretta Nip, [place], [place] [address] PCP: Aretta Nip, MD   Assessment & Plan: Visit Diagnoses:  1. Closed displaced fracture of right femoral neck ([place])     Plan: Patient is 4 months status post right partial hip replacement and doing well.  Continue with physical therapy for strengthening and gait training.  Questions encouraged and answered.  Follow-up as needed.  Follow-Up Instructions: Return if symptoms worsen or fail to improve.   Orders:  Orders Placed This Encounter  Procedures  . XR Pelvis 1-2 Views   No orders of the defined types were placed in this encounter.     Procedures: No procedures performed   Clinical Data: No additional findings.   Subjective: Chief Complaint  Patient presents with  . Right Hip - Pain    Patient is 4 months status post right partial hip replacement.  She is living at [facility].  She is walking with a walker and often without one.  Denies any pain.    Review of Systems   Objective: Vital Signs: There were no vitals taken for this visit.  Physical Exam  Ortho Exam Right hip exam shows a fully healed surgical scar.  Leg lengths are equal. Specialty Comments:  No specialty comments available.  Imaging: Xr Pelvis 1-2 Views  Result Date: 02/02/2017 Stable partial hip replacement    PMFS History: Patient Active Problem List   Diagnosis Date Noted  . Elevated LFTs 12/22/2016  . HCAP (healthcare-associated pneumonia)   . Dementia with behavioral disturbance   . Hypokalemia   . Postoperative anemia   . Hyperlipidemia   . E. coli UTI   . Closed displaced fracture of right femoral neck ([place]) 09/28/2016  . Fracture of left leg 09/27/2016  . Fall 07/12/2016  . Leukocytosis 07/12/2016  . Type 2 diabetes mellitus with  hyperlipidemia ([place]) 07/12/2016  . Acetabular fracture ([place]) 07/11/2016  . Dilated cardiomyopathy secondary to tachycardia ([place]) 12/14/2013  . Syncope 06/14/2013  . Bradycardia 05/11/2013  . Fatigue 05/11/2013  . Chronic diastolic CHF (congestive heart failure) ([place]) 01/06/2013  . Mitral regurgitation 08/06/2012  . PAF (paroxysmal atrial fibrillation) ([place]) 08/02/2012  . Benign essential HTN 08/02/2012  . Pure hypercholesterolemia 08/02/2012  . Liver cyst 08/02/2012   Past Medical History:  Diagnosis Date  . Atrial fibrillation ([place])    s/p DCCV 07/2012  . Benign diastolic hypertension   . Chronic anticoagulation   . Chronic diastolic CHF (congestive heart failure) (H[facility])    EF now 65% by echo 10/2012 with grade II diasotlid dysfunction  . Diabetes mellitus without complication ([place])   . Dilated cardiomyopathy secondary to tachycardia ([place]) 07/2012   2D echo 10/2012 showed EF 65% with grade II diastolic dysfunction  . Elevated cholesterol   . Hyperlipidemia   . Hypertension   . Liver cyst   . Memory loss     Family History  Problem Relation Age of Onset  . Heart disease Mother   . CAD Mother   . Heart disease Father   . CAD Father   . Pancreatic cancer Brother     Past Surgical History:  Procedure Laterality Date  . APPENDECTOMY    .  BACK SURGERY     herniated disc  . CHOLECYSTECTOMY    . TONSILLECTOMY     Social History   Occupational History  . Not on file  Tobacco Use  . Smoking status: Former Research scientist (life sciences)  . Smokeless tobacco: Never Used  Substance and Sexual Activity  . Alcohol use: Yes    Alcohol/week: 0.0 oz    Types: 1 - 2 Glasses of wine per week  . Drug use: No  . Sexual activity: Not on file

## 2017-02-05 DIAGNOSIS — I5032 Chronic diastolic (congestive) heart failure: Secondary | ICD-10-CM | POA: Diagnosis not present

## 2017-02-05 DIAGNOSIS — E119 Type 2 diabetes mellitus without complications: Secondary | ICD-10-CM | POA: Diagnosis not present

## 2017-02-05 DIAGNOSIS — Z7984 Long term (current) use of oral hypoglycemic drugs: Secondary | ICD-10-CM | POA: Diagnosis not present

## 2017-02-05 DIAGNOSIS — F0390 Unspecified dementia without behavioral disturbance: Secondary | ICD-10-CM | POA: Diagnosis not present

## 2017-02-05 DIAGNOSIS — R262 Difficulty in walking, not elsewhere classified: Secondary | ICD-10-CM | POA: Diagnosis not present

## 2017-02-05 DIAGNOSIS — Z9181 History of falling: Secondary | ICD-10-CM | POA: Diagnosis not present

## 2017-02-05 DIAGNOSIS — I110 Hypertensive heart disease with heart failure: Secondary | ICD-10-CM | POA: Diagnosis not present

## 2017-02-05 DIAGNOSIS — I480 Paroxysmal atrial fibrillation: Secondary | ICD-10-CM | POA: Diagnosis not present

## 2017-02-05 DIAGNOSIS — Z87440 Personal history of urinary (tract) infections: Secondary | ICD-10-CM | POA: Diagnosis not present

## 2017-02-05 DIAGNOSIS — M6281 Muscle weakness (generalized): Secondary | ICD-10-CM | POA: Diagnosis not present

## 2017-02-05 DIAGNOSIS — F329 Major depressive disorder, single episode, unspecified: Secondary | ICD-10-CM | POA: Diagnosis not present

## 2017-02-06 DIAGNOSIS — I5032 Chronic diastolic (congestive) heart failure: Secondary | ICD-10-CM | POA: Diagnosis not present

## 2017-02-06 DIAGNOSIS — R262 Difficulty in walking, not elsewhere classified: Secondary | ICD-10-CM | POA: Diagnosis not present

## 2017-02-06 DIAGNOSIS — I480 Paroxysmal atrial fibrillation: Secondary | ICD-10-CM | POA: Diagnosis not present

## 2017-02-06 DIAGNOSIS — M6281 Muscle weakness (generalized): Secondary | ICD-10-CM | POA: Diagnosis not present

## 2017-02-06 DIAGNOSIS — I110 Hypertensive heart disease with heart failure: Secondary | ICD-10-CM | POA: Diagnosis not present

## 2017-02-06 DIAGNOSIS — E119 Type 2 diabetes mellitus without complications: Secondary | ICD-10-CM | POA: Diagnosis not present

## 2017-02-09 DIAGNOSIS — R262 Difficulty in walking, not elsewhere classified: Secondary | ICD-10-CM | POA: Diagnosis not present

## 2017-02-09 DIAGNOSIS — I480 Paroxysmal atrial fibrillation: Secondary | ICD-10-CM | POA: Diagnosis not present

## 2017-02-09 DIAGNOSIS — I110 Hypertensive heart disease with heart failure: Secondary | ICD-10-CM | POA: Diagnosis not present

## 2017-02-09 DIAGNOSIS — E119 Type 2 diabetes mellitus without complications: Secondary | ICD-10-CM | POA: Diagnosis not present

## 2017-02-09 DIAGNOSIS — I5032 Chronic diastolic (congestive) heart failure: Secondary | ICD-10-CM | POA: Diagnosis not present

## 2017-02-09 DIAGNOSIS — M6281 Muscle weakness (generalized): Secondary | ICD-10-CM | POA: Diagnosis not present

## 2017-02-10 DIAGNOSIS — F419 Anxiety disorder, unspecified: Secondary | ICD-10-CM | POA: Diagnosis not present

## 2017-02-10 DIAGNOSIS — F339 Major depressive disorder, recurrent, unspecified: Secondary | ICD-10-CM | POA: Diagnosis not present

## 2017-02-11 DIAGNOSIS — F339 Major depressive disorder, recurrent, unspecified: Secondary | ICD-10-CM | POA: Diagnosis not present

## 2017-02-11 DIAGNOSIS — I5032 Chronic diastolic (congestive) heart failure: Secondary | ICD-10-CM | POA: Diagnosis not present

## 2017-02-11 DIAGNOSIS — I110 Hypertensive heart disease with heart failure: Secondary | ICD-10-CM | POA: Diagnosis not present

## 2017-02-11 DIAGNOSIS — E46 Unspecified protein-calorie malnutrition: Secondary | ICD-10-CM | POA: Diagnosis not present

## 2017-02-11 DIAGNOSIS — I482 Chronic atrial fibrillation: Secondary | ICD-10-CM | POA: Diagnosis not present

## 2017-02-11 DIAGNOSIS — R269 Unspecified abnormalities of gait and mobility: Secondary | ICD-10-CM | POA: Diagnosis not present

## 2017-02-11 DIAGNOSIS — E119 Type 2 diabetes mellitus without complications: Secondary | ICD-10-CM | POA: Diagnosis not present

## 2017-02-11 DIAGNOSIS — M6281 Muscle weakness (generalized): Secondary | ICD-10-CM | POA: Diagnosis not present

## 2017-02-11 DIAGNOSIS — R32 Unspecified urinary incontinence: Secondary | ICD-10-CM | POA: Diagnosis not present

## 2017-02-11 DIAGNOSIS — I10 Essential (primary) hypertension: Secondary | ICD-10-CM | POA: Diagnosis not present

## 2017-02-11 DIAGNOSIS — I5030 Unspecified diastolic (congestive) heart failure: Secondary | ICD-10-CM | POA: Diagnosis not present

## 2017-02-11 DIAGNOSIS — R296 Repeated falls: Secondary | ICD-10-CM | POA: Diagnosis not present

## 2017-02-11 DIAGNOSIS — K5900 Constipation, unspecified: Secondary | ICD-10-CM | POA: Diagnosis not present

## 2017-02-11 DIAGNOSIS — I480 Paroxysmal atrial fibrillation: Secondary | ICD-10-CM | POA: Diagnosis not present

## 2017-02-11 DIAGNOSIS — E785 Hyperlipidemia, unspecified: Secondary | ICD-10-CM | POA: Diagnosis not present

## 2017-02-11 DIAGNOSIS — F0150 Vascular dementia without behavioral disturbance: Secondary | ICD-10-CM | POA: Diagnosis not present

## 2017-02-11 DIAGNOSIS — R262 Difficulty in walking, not elsewhere classified: Secondary | ICD-10-CM | POA: Diagnosis not present

## 2017-02-12 DIAGNOSIS — I110 Hypertensive heart disease with heart failure: Secondary | ICD-10-CM | POA: Diagnosis not present

## 2017-02-12 DIAGNOSIS — R262 Difficulty in walking, not elsewhere classified: Secondary | ICD-10-CM | POA: Diagnosis not present

## 2017-02-12 DIAGNOSIS — M6281 Muscle weakness (generalized): Secondary | ICD-10-CM | POA: Diagnosis not present

## 2017-02-12 DIAGNOSIS — E119 Type 2 diabetes mellitus without complications: Secondary | ICD-10-CM | POA: Diagnosis not present

## 2017-02-12 DIAGNOSIS — I480 Paroxysmal atrial fibrillation: Secondary | ICD-10-CM | POA: Diagnosis not present

## 2017-02-12 DIAGNOSIS — I5032 Chronic diastolic (congestive) heart failure: Secondary | ICD-10-CM | POA: Diagnosis not present

## 2017-02-13 DIAGNOSIS — F331 Major depressive disorder, recurrent, moderate: Secondary | ICD-10-CM | POA: Diagnosis not present

## 2017-02-13 DIAGNOSIS — F0390 Unspecified dementia without behavioral disturbance: Secondary | ICD-10-CM | POA: Diagnosis not present

## 2017-02-15 ENCOUNTER — Emergency Department (HOSPITAL_COMMUNITY)
Admission: EM | Admit: 2017-02-15 | Discharge: 2017-02-16 | Disposition: A | Discharge status: Home / Self Care | Payer: Medicare Other | Attending: Emergency Medicine | Admitting: Emergency Medicine

## 2017-02-15 ENCOUNTER — Encounter (HOSPITAL_COMMUNITY): Payer: Self-pay | Admitting: Emergency Medicine

## 2017-02-15 DIAGNOSIS — Y92122 Bedroom in nursing home as the place of occurrence of the external cause: Secondary | ICD-10-CM | POA: Diagnosis not present

## 2017-02-15 DIAGNOSIS — Z87891 Personal history of nicotine dependence: Secondary | ICD-10-CM | POA: Diagnosis not present

## 2017-02-15 DIAGNOSIS — Z7984 Long term (current) use of oral hypoglycemic drugs: Secondary | ICD-10-CM | POA: Diagnosis not present

## 2017-02-15 DIAGNOSIS — I5032 Chronic diastolic (congestive) heart failure: Secondary | ICD-10-CM | POA: Diagnosis not present

## 2017-02-15 DIAGNOSIS — I110 Hypertensive heart disease with heart failure: Secondary | ICD-10-CM | POA: Insufficient documentation

## 2017-02-15 DIAGNOSIS — S098XXA Other specified injuries of head, initial encounter: Secondary | ICD-10-CM | POA: Diagnosis not present

## 2017-02-15 DIAGNOSIS — E119 Type 2 diabetes mellitus without complications: Secondary | ICD-10-CM | POA: Insufficient documentation

## 2017-02-15 DIAGNOSIS — Z79899 Other long term (current) drug therapy: Secondary | ICD-10-CM | POA: Insufficient documentation

## 2017-02-15 DIAGNOSIS — W01190A Fall on same level from slipping, tripping and stumbling with subsequent striking against furniture, initial encounter: Secondary | ICD-10-CM | POA: Insufficient documentation

## 2017-02-15 DIAGNOSIS — Y9389 Activity, other specified: Secondary | ICD-10-CM | POA: Insufficient documentation

## 2017-02-15 DIAGNOSIS — S0990XA Unspecified injury of head, initial encounter: Secondary | ICD-10-CM

## 2017-02-15 DIAGNOSIS — Z7901 Long term (current) use of anticoagulants: Secondary | ICD-10-CM | POA: Diagnosis not present

## 2017-02-15 DIAGNOSIS — I480 Paroxysmal atrial fibrillation: Secondary | ICD-10-CM | POA: Diagnosis not present

## 2017-02-15 DIAGNOSIS — Y999 Unspecified external cause status: Secondary | ICD-10-CM | POA: Diagnosis not present

## 2017-02-15 DIAGNOSIS — F0390 Unspecified dementia without behavioral disturbance: Secondary | ICD-10-CM | POA: Insufficient documentation

## 2017-02-15 DIAGNOSIS — S0181XA Laceration without foreign body of other part of head, initial encounter: Secondary | ICD-10-CM | POA: Diagnosis not present

## 2017-02-15 NOTE — Discharge Instructions (Signed)
Ice, tylenol as needed ER for severe headache, vomiting or numbness / weakness, difficulty walking / speaking or changes in vision

## 2017-02-15 NOTE — ED Triage Notes (Signed)
Patient arrives post mechanical fall this evening. States she tripped and struck her head on her a bedside book shelf. Small injury to right side of head, bleeding controlled. Patient takes Eliquis.

## 2017-02-15 NOTE — ED Provider Notes (Signed)
[facility] EMERGENCY DEPARTMENT Provider Note   CSN: [id number] Arrival date & time: 02/15/17  2137     History   Chief Complaint Chief Complaint  Patient presents with  . Fall    HPI Cynthia Wright is a 83 y.o. female.  HPI  The pt is a 83 y/o female with hx of Afib, on eliquis - and hx of Chronic CHF(asymptomatic lately), Htn and Hyperlipidemia.    The patient was in her usual state of health this evening when she had an accidental fall when she bent over she accidentally struck the right parietal scalp on the edge of a bookshelf, she fell to the ground, she reports that it is because of her right leg which is chronically weak.  Someone was visiting with her at the time and told her to stay on the ground, the staff at her assisted care facility called for the paramedic transport who helped her to her feet, she reports that she has no headache, no nausea, no blurred vision, no numbness weakness nausea vomiting chest pain shortness of breath neck pain or any other symptoms.  This occurred a short time ago, the symptoms were very mild and have since resolved.  Past Medical History:  Diagnosis Date  . Atrial fibrillation ([place])    s/p DCCV 07/2012  . Benign diastolic hypertension   . Chronic anticoagulation   . Chronic diastolic CHF (congestive heart failure) (HCC)    EF now 65% by echo 10/2012 with grade II diasotlid dysfunction  . Diabetes mellitus without complication ([place])   . Dilated cardiomyopathy secondary to tachycardia ([place]) 07/2012   2D echo 10/2012 showed EF 65% with grade II diastolic dysfunction  . Elevated cholesterol   . Hyperlipidemia   . Hypertension   . Liver cyst   . Memory loss     Patient Active Problem List   Diagnosis Date Noted  . Elevated LFTs 12/22/2016  . HCAP (healthcare-associated pneumonia)   . Dementia with behavioral disturbance   . Hypokalemia   . Postoperative anemia   . Hyperlipidemia   . E. coli UTI   . Closed  displaced fracture of right femoral neck ([place]) 09/28/2016  . Fracture of left leg 09/27/2016  . Fall 07/12/2016  . Leukocytosis 07/12/2016  . Type 2 diabetes mellitus with hyperlipidemia ([place]) 07/12/2016  . Acetabular fracture ([place]) 07/11/2016  . Dilated cardiomyopathy secondary to tachycardia ([place]) 12/14/2013  . Syncope 06/14/2013  . Bradycardia 05/11/2013  . Fatigue 05/11/2013  . Chronic diastolic CHF (congestive heart failure) ([place]) 01/06/2013  . Mitral regurgitation 08/06/2012  . PAF (paroxysmal atrial fibrillation) ([place]) 08/02/2012  . Benign essential HTN 08/02/2012  . Pure hypercholesterolemia 08/02/2012  . Liver cyst 08/02/2012    Past Surgical History:  Procedure Laterality Date  . ANTERIOR APPROACH HEMI HIP ARTHROPLASTY Right 09/28/2016   Performed by Leandrew Koyanagi, MD at [facility]    . BACK SURGERY     herniated disc  . CARDIOVERSION N/A 09/28/2014   Performed by Sueanne Margarita, MD at [facility] ENDOSCOPY  . CARDIOVERSION N/A 08/09/2012   Performed by Sueanne Margarita, MD at [facility] ENDOSCOPY  . CHOLECYSTECTOMY    . TONSILLECTOMY    . TRANSESOPHAGEAL ECHOCARDIOGRAM (TEE) N/A 08/09/2012   Performed by Sueanne Margarita, MD at [facility] ENDOSCOPY    OB History    No data available       Home Medications    Prior to Admission medications  Medication Sig Start Date End Date Taking? Authorizing Provider  amiodarone (PACERONE) 200 MG tablet Take 1 tablet (200 mg total) by mouth daily. 10/20/16  Yes Rosita Fire, MD  buPROPion [facility] SR) 150 MG 12 hr tablet Take 150 mg 2 (two) times daily by mouth.   Yes [provider]  diltiazem (CARDIZEM CD) 240 MG 24 hr capsule Take 1 capsule (240 mg total) by mouth daily. 12/16/16 03/16/17 Yes Turner, Eber Hong, MD  docusate sodium (COLACE) 100 MG capsule Take 200 mg by mouth at bedtime.    Yes [provider]  donepezil (ARICEPT) 10 MG tablet Take 10 mg by mouth at bedtime.   Yes [provider]    ELIQUIS 2.5 MG TABS tablet TAKE 1 TABLET (2.5 MG TOTAL) BY MOUTH 2 (TWO) TIMES DAILY. 04/21/16  Yes Turner, Eber Hong, MD  furosemide (LASIX) 40 MG tablet Take 1 tablet (40 mg total) by mouth daily. 12/27/16  Yes Lavina Hamman, MD  iron polysaccharides (NIFEREX) 150 MG capsule Take 1 capsule (150 mg total) by mouth daily. 09/30/16  Yes Barton Dubois, MD  Melatonin 10 MG TABS Take 30 mg by mouth at bedtime.   Yes [provider]  neomycin-bacitracin-polymyxin (NEOSPORIN) [phone] ointment Apply 1 application daily topically. Left leg after dressing change.   Yes [provider]  polyethylene glycol (MIRALAX / GLYCOLAX) packet Take 17 g by mouth daily.   Yes [provider]  potassium chloride SA (KLOR-CON M20) 20 MEQ tablet Take 2 tablets (40 mEq total) by mouth every other day. Patient taking differently: Take 20 mEq daily by mouth.  05/23/16  Yes Turner, Traci R, MD  rosuvastatin (CRESTOR) 5 MG tablet Take 5 mg by mouth at bedtime.    Yes [provider]  sitaGLIPtin (JANUVIA) 100 MG tablet Take 100 mg by mouth daily.   Yes [provider]  ibuprofen (ADVIL,MOTRIN) 200 MG tablet Take 1 tablet (200 mg total) by mouth every 8 (eight) hours as needed. Patient not taking: Reported on 02/15/2017 12/26/16   Lavina Hamman, MD    Family History Family History  Problem Relation Age of Onset  . Heart disease Mother   . CAD Mother   . Heart disease Father   . CAD Father   . Pancreatic cancer Brother     Social History Social History   Tobacco Use  . Smoking status: Former Research scientist (life sciences)  . Smokeless tobacco: Never Used  Substance Use Topics  . Alcohol use: Yes    Alcohol/week: 0.0 oz    Types: 1 - 2 Glasses of wine per week  . Drug use: No     Allergies   Codeine and Lortab [hydrocodone-acetaminophen]   Review of Systems Review of Systems  All other systems reviewed and are negative.    Physical Exam Updated Vital Signs BP (!) 196/58    Pulse 61   Temp 97.8 F (36.6 C)   Resp 16   SpO2 100%   Physical Exam  Constitutional: She appears well-developed and well-nourished. No distress.  HENT:  Head: Normocephalic.  Mouth/Throat: Oropharynx is clear and moist. No oropharyngeal exudate.  Sub centimeter abrasion with tiny bruise - no hematoma, no laceration, no hemotympanum, no malocclusion, no raccoon eyes, no battle sign  Eyes: Conjunctivae and EOM are normal. Pupils are equal, round, and reactive to light. Right eye exhibits no discharge. Left eye exhibits no discharge. No scleral icterus.  Neck: Normal range of motion. Neck supple. No JVD present.  No thyromegaly present.  Nontender neck, very supple with full range of motion  Cardiovascular: Normal rate, normal heart sounds and intact distal pulses. Exam reveals no gallop and no friction rub.  No murmur heard. Slightly irregularly irregular rhythm  Pulmonary/Chest: Effort normal and breath sounds normal. No respiratory distress. She has no wheezes. She has no rales.  Abdominal: Soft. Bowel sounds are normal. She exhibits no distension and no mass. There is no tenderness.  Musculoskeletal: Normal range of motion. She exhibits no edema or tenderness.  Lymphadenopathy:    She has no cervical adenopathy.  Neurological: She is alert. Coordination normal.  The patient is able to move all 4 extremities with normal strength and sensation, normal cranial nerves III through XII, normal memory.  No facial droop.  Clears speech.  Skin: Skin is warm and dry. No rash noted. No erythema.  Abrasion as noted to the right parietal scalp  Psychiatric: She has a normal mood and affect. Her behavior is normal.  Nursing note and vitals reviewed.    ED Treatments / Results  Labs (all labs ordered are listed, but only abnormal results are displayed) Labs Reviewed - No data to display   Radiology No results found.  Procedures Procedures (including critical care time)  Medications  Ordered in ED Medications - No data to display   Initial Impression / Assessment and Plan / ED Course  I have reviewed the triage vital signs and the nursing notes.  Pertinent labs & imaging results that were available during my care of the patient were reviewed by me and considered in my medical decision making (see chart for details).     The patient has an unremarkable exam except for a very small abrasion.  I do not believe that this necessitates CT scan for further imaging.  No headache, no symptoms, no nausea, no neurologic complaints and a very normal exam.  The family members on the way, we will discuss this when he arrives.  Anticipate discharge  Patient continues to look well on repeat exam at 10:40 PM.  She has normal mental status.  I discussed the care with her son who is her emergency contact and he is in agreement that the patient can be safely discharged.  The patient is well-appearing and requesting discharge and I feel that that is appropriate at this time.  Final Clinical Impressions(s) / ED Diagnoses   Final diagnoses:  Injury of head, initial encounter    ED Discharge Orders    None       Noemi Chapel, MD 02/15/17 2243

## 2017-02-16 DIAGNOSIS — E119 Type 2 diabetes mellitus without complications: Secondary | ICD-10-CM | POA: Diagnosis not present

## 2017-02-16 DIAGNOSIS — I5032 Chronic diastolic (congestive) heart failure: Secondary | ICD-10-CM | POA: Diagnosis not present

## 2017-02-16 DIAGNOSIS — I110 Hypertensive heart disease with heart failure: Secondary | ICD-10-CM | POA: Diagnosis not present

## 2017-02-16 DIAGNOSIS — I480 Paroxysmal atrial fibrillation: Secondary | ICD-10-CM | POA: Diagnosis not present

## 2017-02-16 DIAGNOSIS — S0990XA Unspecified injury of head, initial encounter: Secondary | ICD-10-CM | POA: Diagnosis not present

## 2017-02-16 DIAGNOSIS — R262 Difficulty in walking, not elsewhere classified: Secondary | ICD-10-CM | POA: Diagnosis not present

## 2017-02-16 DIAGNOSIS — M6281 Muscle weakness (generalized): Secondary | ICD-10-CM | POA: Diagnosis not present

## 2017-02-16 DIAGNOSIS — R4182 Altered mental status, unspecified: Secondary | ICD-10-CM | POA: Diagnosis not present

## 2017-02-16 NOTE — ED Notes (Signed)
Pt departed in NAD, and in care of [facility].  

## 2017-02-17 DIAGNOSIS — I110 Hypertensive heart disease with heart failure: Secondary | ICD-10-CM | POA: Diagnosis not present

## 2017-02-17 DIAGNOSIS — M6281 Muscle weakness (generalized): Secondary | ICD-10-CM | POA: Diagnosis not present

## 2017-02-17 DIAGNOSIS — I480 Paroxysmal atrial fibrillation: Secondary | ICD-10-CM | POA: Diagnosis not present

## 2017-02-17 DIAGNOSIS — E119 Type 2 diabetes mellitus without complications: Secondary | ICD-10-CM | POA: Diagnosis not present

## 2017-02-17 DIAGNOSIS — R262 Difficulty in walking, not elsewhere classified: Secondary | ICD-10-CM | POA: Diagnosis not present

## 2017-02-17 DIAGNOSIS — I5032 Chronic diastolic (congestive) heart failure: Secondary | ICD-10-CM | POA: Diagnosis not present

## 2017-02-18 DIAGNOSIS — M6281 Muscle weakness (generalized): Secondary | ICD-10-CM | POA: Diagnosis not present

## 2017-02-18 DIAGNOSIS — E119 Type 2 diabetes mellitus without complications: Secondary | ICD-10-CM | POA: Diagnosis not present

## 2017-02-18 DIAGNOSIS — I110 Hypertensive heart disease with heart failure: Secondary | ICD-10-CM | POA: Diagnosis not present

## 2017-02-18 DIAGNOSIS — I480 Paroxysmal atrial fibrillation: Secondary | ICD-10-CM | POA: Diagnosis not present

## 2017-02-18 DIAGNOSIS — R262 Difficulty in walking, not elsewhere classified: Secondary | ICD-10-CM | POA: Diagnosis not present

## 2017-02-18 DIAGNOSIS — I5032 Chronic diastolic (congestive) heart failure: Secondary | ICD-10-CM | POA: Diagnosis not present

## 2017-02-23 DIAGNOSIS — E119 Type 2 diabetes mellitus without complications: Secondary | ICD-10-CM | POA: Diagnosis not present

## 2017-02-23 DIAGNOSIS — I5032 Chronic diastolic (congestive) heart failure: Secondary | ICD-10-CM | POA: Diagnosis not present

## 2017-02-23 DIAGNOSIS — I110 Hypertensive heart disease with heart failure: Secondary | ICD-10-CM | POA: Diagnosis not present

## 2017-02-23 DIAGNOSIS — M6281 Muscle weakness (generalized): Secondary | ICD-10-CM | POA: Diagnosis not present

## 2017-02-23 DIAGNOSIS — R262 Difficulty in walking, not elsewhere classified: Secondary | ICD-10-CM | POA: Diagnosis not present

## 2017-02-23 DIAGNOSIS — I480 Paroxysmal atrial fibrillation: Secondary | ICD-10-CM | POA: Diagnosis not present

## 2017-02-24 DIAGNOSIS — F419 Anxiety disorder, unspecified: Secondary | ICD-10-CM | POA: Diagnosis not present

## 2017-02-24 DIAGNOSIS — F331 Major depressive disorder, recurrent, moderate: Secondary | ICD-10-CM | POA: Diagnosis not present

## 2017-02-26 DIAGNOSIS — M6281 Muscle weakness (generalized): Secondary | ICD-10-CM | POA: Diagnosis not present

## 2017-02-26 DIAGNOSIS — E785 Hyperlipidemia, unspecified: Secondary | ICD-10-CM | POA: Diagnosis not present

## 2017-02-26 DIAGNOSIS — I5032 Chronic diastolic (congestive) heart failure: Secondary | ICD-10-CM | POA: Diagnosis not present

## 2017-02-26 DIAGNOSIS — I5030 Unspecified diastolic (congestive) heart failure: Secondary | ICD-10-CM | POA: Diagnosis not present

## 2017-02-26 DIAGNOSIS — I10 Essential (primary) hypertension: Secondary | ICD-10-CM | POA: Diagnosis not present

## 2017-02-26 DIAGNOSIS — E46 Unspecified protein-calorie malnutrition: Secondary | ICD-10-CM | POA: Diagnosis not present

## 2017-02-26 DIAGNOSIS — F339 Major depressive disorder, recurrent, unspecified: Secondary | ICD-10-CM | POA: Diagnosis not present

## 2017-02-26 DIAGNOSIS — I110 Hypertensive heart disease with heart failure: Secondary | ICD-10-CM | POA: Diagnosis not present

## 2017-02-26 DIAGNOSIS — I480 Paroxysmal atrial fibrillation: Secondary | ICD-10-CM | POA: Diagnosis not present

## 2017-02-26 DIAGNOSIS — K5900 Constipation, unspecified: Secondary | ICD-10-CM | POA: Diagnosis not present

## 2017-02-26 DIAGNOSIS — R262 Difficulty in walking, not elsewhere classified: Secondary | ICD-10-CM | POA: Diagnosis not present

## 2017-02-26 DIAGNOSIS — F0150 Vascular dementia without behavioral disturbance: Secondary | ICD-10-CM | POA: Diagnosis not present

## 2017-02-26 DIAGNOSIS — I482 Chronic atrial fibrillation: Secondary | ICD-10-CM | POA: Diagnosis not present

## 2017-02-26 DIAGNOSIS — E119 Type 2 diabetes mellitus without complications: Secondary | ICD-10-CM | POA: Diagnosis not present

## 2017-02-27 DIAGNOSIS — E46 Unspecified protein-calorie malnutrition: Secondary | ICD-10-CM | POA: Diagnosis not present

## 2017-02-27 DIAGNOSIS — F0150 Vascular dementia without behavioral disturbance: Secondary | ICD-10-CM | POA: Diagnosis not present

## 2017-02-27 DIAGNOSIS — E119 Type 2 diabetes mellitus without complications: Secondary | ICD-10-CM | POA: Diagnosis not present

## 2017-02-27 DIAGNOSIS — R269 Unspecified abnormalities of gait and mobility: Secondary | ICD-10-CM | POA: Diagnosis not present

## 2017-02-27 DIAGNOSIS — R296 Repeated falls: Secondary | ICD-10-CM | POA: Diagnosis not present

## 2017-02-27 DIAGNOSIS — E785 Hyperlipidemia, unspecified: Secondary | ICD-10-CM | POA: Diagnosis not present

## 2017-02-27 DIAGNOSIS — I482 Chronic atrial fibrillation: Secondary | ICD-10-CM | POA: Diagnosis not present

## 2017-02-27 DIAGNOSIS — R32 Unspecified urinary incontinence: Secondary | ICD-10-CM | POA: Diagnosis not present

## 2017-02-27 DIAGNOSIS — I10 Essential (primary) hypertension: Secondary | ICD-10-CM | POA: Diagnosis not present

## 2017-02-27 DIAGNOSIS — K5900 Constipation, unspecified: Secondary | ICD-10-CM | POA: Diagnosis not present

## 2017-02-27 DIAGNOSIS — F339 Major depressive disorder, recurrent, unspecified: Secondary | ICD-10-CM | POA: Diagnosis not present

## 2017-02-27 DIAGNOSIS — I5030 Unspecified diastolic (congestive) heart failure: Secondary | ICD-10-CM | POA: Diagnosis not present

## 2017-03-03 DIAGNOSIS — I110 Hypertensive heart disease with heart failure: Secondary | ICD-10-CM | POA: Diagnosis not present

## 2017-03-03 DIAGNOSIS — I480 Paroxysmal atrial fibrillation: Secondary | ICD-10-CM | POA: Diagnosis not present

## 2017-03-03 DIAGNOSIS — R262 Difficulty in walking, not elsewhere classified: Secondary | ICD-10-CM | POA: Diagnosis not present

## 2017-03-03 DIAGNOSIS — I5032 Chronic diastolic (congestive) heart failure: Secondary | ICD-10-CM | POA: Diagnosis not present

## 2017-03-03 DIAGNOSIS — E119 Type 2 diabetes mellitus without complications: Secondary | ICD-10-CM | POA: Diagnosis not present

## 2017-03-03 DIAGNOSIS — M6281 Muscle weakness (generalized): Secondary | ICD-10-CM | POA: Diagnosis not present

## 2017-03-05 DIAGNOSIS — I5032 Chronic diastolic (congestive) heart failure: Secondary | ICD-10-CM | POA: Diagnosis not present

## 2017-03-05 DIAGNOSIS — E119 Type 2 diabetes mellitus without complications: Secondary | ICD-10-CM | POA: Diagnosis not present

## 2017-03-05 DIAGNOSIS — M6281 Muscle weakness (generalized): Secondary | ICD-10-CM | POA: Diagnosis not present

## 2017-03-05 DIAGNOSIS — I110 Hypertensive heart disease with heart failure: Secondary | ICD-10-CM | POA: Diagnosis not present

## 2017-03-05 DIAGNOSIS — R262 Difficulty in walking, not elsewhere classified: Secondary | ICD-10-CM | POA: Diagnosis not present

## 2017-03-05 DIAGNOSIS — I480 Paroxysmal atrial fibrillation: Secondary | ICD-10-CM | POA: Diagnosis not present

## 2017-03-10 DIAGNOSIS — F419 Anxiety disorder, unspecified: Secondary | ICD-10-CM | POA: Diagnosis not present

## 2017-03-10 DIAGNOSIS — F331 Major depressive disorder, recurrent, moderate: Secondary | ICD-10-CM | POA: Diagnosis not present

## 2017-03-11 DIAGNOSIS — R262 Difficulty in walking, not elsewhere classified: Secondary | ICD-10-CM | POA: Diagnosis not present

## 2017-03-11 DIAGNOSIS — E119 Type 2 diabetes mellitus without complications: Secondary | ICD-10-CM | POA: Diagnosis not present

## 2017-03-11 DIAGNOSIS — M6281 Muscle weakness (generalized): Secondary | ICD-10-CM | POA: Diagnosis not present

## 2017-03-11 DIAGNOSIS — I110 Hypertensive heart disease with heart failure: Secondary | ICD-10-CM | POA: Diagnosis not present

## 2017-03-11 DIAGNOSIS — I5032 Chronic diastolic (congestive) heart failure: Secondary | ICD-10-CM | POA: Diagnosis not present

## 2017-03-11 DIAGNOSIS — I480 Paroxysmal atrial fibrillation: Secondary | ICD-10-CM | POA: Diagnosis not present

## 2017-03-17 DIAGNOSIS — F331 Major depressive disorder, recurrent, moderate: Secondary | ICD-10-CM | POA: Diagnosis not present

## 2017-03-17 DIAGNOSIS — F419 Anxiety disorder, unspecified: Secondary | ICD-10-CM | POA: Diagnosis not present

## 2017-03-18 DIAGNOSIS — I480 Paroxysmal atrial fibrillation: Secondary | ICD-10-CM | POA: Diagnosis not present

## 2017-03-18 DIAGNOSIS — M6281 Muscle weakness (generalized): Secondary | ICD-10-CM | POA: Diagnosis not present

## 2017-03-18 DIAGNOSIS — R262 Difficulty in walking, not elsewhere classified: Secondary | ICD-10-CM | POA: Diagnosis not present

## 2017-03-18 DIAGNOSIS — I5032 Chronic diastolic (congestive) heart failure: Secondary | ICD-10-CM | POA: Diagnosis not present

## 2017-03-18 DIAGNOSIS — E119 Type 2 diabetes mellitus without complications: Secondary | ICD-10-CM | POA: Diagnosis not present

## 2017-03-18 DIAGNOSIS — I110 Hypertensive heart disease with heart failure: Secondary | ICD-10-CM | POA: Diagnosis not present

## 2017-04-03 DIAGNOSIS — I10 Essential (primary) hypertension: Secondary | ICD-10-CM | POA: Diagnosis not present

## 2017-04-03 DIAGNOSIS — R296 Repeated falls: Secondary | ICD-10-CM | POA: Diagnosis not present

## 2017-04-03 DIAGNOSIS — E119 Type 2 diabetes mellitus without complications: Secondary | ICD-10-CM | POA: Diagnosis not present

## 2017-04-03 DIAGNOSIS — K5900 Constipation, unspecified: Secondary | ICD-10-CM | POA: Diagnosis not present

## 2017-04-03 DIAGNOSIS — R269 Unspecified abnormalities of gait and mobility: Secondary | ICD-10-CM | POA: Diagnosis not present

## 2017-04-03 DIAGNOSIS — E785 Hyperlipidemia, unspecified: Secondary | ICD-10-CM | POA: Diagnosis not present

## 2017-04-03 DIAGNOSIS — I482 Chronic atrial fibrillation: Secondary | ICD-10-CM | POA: Diagnosis not present

## 2017-04-03 DIAGNOSIS — R32 Unspecified urinary incontinence: Secondary | ICD-10-CM | POA: Diagnosis not present

## 2017-04-03 DIAGNOSIS — E46 Unspecified protein-calorie malnutrition: Secondary | ICD-10-CM | POA: Diagnosis not present

## 2017-04-03 DIAGNOSIS — I5030 Unspecified diastolic (congestive) heart failure: Secondary | ICD-10-CM | POA: Diagnosis not present

## 2017-04-07 DIAGNOSIS — F331 Major depressive disorder, recurrent, moderate: Secondary | ICD-10-CM | POA: Diagnosis not present

## 2017-04-07 DIAGNOSIS — F419 Anxiety disorder, unspecified: Secondary | ICD-10-CM | POA: Diagnosis not present

## 2017-04-12 ENCOUNTER — Emergency Department (HOSPITAL_COMMUNITY): Payer: Medicare Other

## 2017-04-12 ENCOUNTER — Other Ambulatory Visit: Payer: Self-pay

## 2017-04-12 ENCOUNTER — Emergency Department (HOSPITAL_COMMUNITY)
Admission: EM | Admit: 2017-04-12 | Discharge: 2017-04-12 | Disposition: A | Discharge status: Home / Self Care | Payer: Medicare Other | Attending: Emergency Medicine | Admitting: Emergency Medicine

## 2017-04-12 DIAGNOSIS — I110 Hypertensive heart disease with heart failure: Secondary | ICD-10-CM | POA: Diagnosis not present

## 2017-04-12 DIAGNOSIS — Z87891 Personal history of nicotine dependence: Secondary | ICD-10-CM | POA: Diagnosis not present

## 2017-04-12 DIAGNOSIS — M79604 Pain in right leg: Secondary | ICD-10-CM | POA: Diagnosis not present

## 2017-04-12 DIAGNOSIS — I5032 Chronic diastolic (congestive) heart failure: Secondary | ICD-10-CM | POA: Insufficient documentation

## 2017-04-12 DIAGNOSIS — Z471 Aftercare following joint replacement surgery: Secondary | ICD-10-CM | POA: Diagnosis not present

## 2017-04-12 DIAGNOSIS — Z79899 Other long term (current) drug therapy: Secondary | ICD-10-CM | POA: Diagnosis not present

## 2017-04-12 DIAGNOSIS — M25551 Pain in right hip: Secondary | ICD-10-CM | POA: Insufficient documentation

## 2017-04-12 DIAGNOSIS — Z96641 Presence of right artificial hip joint: Secondary | ICD-10-CM | POA: Diagnosis not present

## 2017-04-12 MED ORDER — IBUPROFEN 800 MG PO TABS: 800.0000 mg | ORAL_TABLET | Freq: Three times a day (TID) | ORAL | 0 refills | Status: AC | PRN

## 2017-04-12 MED ORDER — TRAMADOL HCL 50 MG PO TABS: 50.0000 mg | ORAL_TABLET | Freq: Four times a day (QID) | ORAL | 0 refills | Status: DC | PRN

## 2017-04-12 NOTE — Discharge Instructions (Signed)
You will need to see your orthopedist.  Your x-rays did not show any abnormality today.

## 2017-04-12 NOTE — ED Triage Notes (Signed)
Per EMS: Pt is coming from [facility]. Pt had a hip fracture approximately 6 months ago. Pt has been having leg pain since then. Pt reports it is from knee to hip.  Pt Is requesting an x-ray.

## 2017-04-12 NOTE — ED Notes (Signed)
Patient called out for restroom assistance. Brought walker to patient's room to assist with ambulation. Patient's visitors arrived and patient reports she will "call later for assistance." Call bell within reach.

## 2017-04-12 NOTE — ED Provider Notes (Signed)
[facility] DEPT Provider Note   CSN: [id number] Arrival date & time: 04/12/17  1255     History   Chief Complaint Chief Complaint  Patient presents with  . Leg Pain    HPI Cynthia Wright is a 84 y.o. female.  HPI Patient presents to the emergency department with right hip pain that has been ongoing for the last several months.  The patient states she had a hip surgery following a fracture 6 months ago.  She states that she was given rehab for this.  She states that she has been having problems with increasing pain especially over the last couple of weeks.  Patient states that nothing seems to make the condition better movements make the pain worse.  Patient states she has had no recent falls or injuries.  Patient denies leg swelling nausea vomiting fever weakness dizziness headache back pain or syncope Past Medical History:  Diagnosis Date  . Atrial fibrillation ([place])    s/p DCCV 07/2012  . Benign diastolic hypertension   . Chronic anticoagulation   . Chronic diastolic CHF (congestive heart failure) (HCC)    EF now 65% by echo 10/2012 with grade II diasotlid dysfunction  . Diabetes mellitus without complication ([place])   . Dilated cardiomyopathy secondary to tachycardia ([place]) 07/2012   2D echo 10/2012 showed EF 65% with grade II diastolic dysfunction  . Elevated cholesterol   . Hyperlipidemia   . Hypertension   . Liver cyst   . Memory loss     Patient Active Problem List   Diagnosis Date Noted  . Elevated LFTs 12/22/2016  . HCAP (healthcare-associated pneumonia)   . Dementia with behavioral disturbance   . Hypokalemia   . Postoperative anemia   . Hyperlipidemia   . E. coli UTI   . Closed displaced fracture of right femoral neck ([place]) 09/28/2016  . Fracture of left leg 09/27/2016  . Fall 07/12/2016  . Leukocytosis 07/12/2016  . Type 2 diabetes mellitus with hyperlipidemia ([place]) 07/12/2016  . Acetabular fracture ([place]) 07/11/2016  .  Dilated cardiomyopathy secondary to tachycardia ([place]) 12/14/2013  . Syncope 06/14/2013  . Bradycardia 05/11/2013  . Fatigue 05/11/2013  . Chronic diastolic CHF (congestive heart failure) ([place]) 01/06/2013  . Mitral regurgitation 08/06/2012  . PAF (paroxysmal atrial fibrillation) ([place]) 08/02/2012  . Benign essential HTN 08/02/2012  . Pure hypercholesterolemia 08/02/2012  . Liver cyst 08/02/2012    Past Surgical History:  Procedure Laterality Date  . ANTERIOR APPROACH HEMI HIP ARTHROPLASTY Right 09/28/2016   Procedure: ANTERIOR APPROACH HEMI HIP ARTHROPLASTY;  Surgeon: Leandrew Koyanagi, MD;  Location: [facility];  Service: Orthopedics;  Laterality: Right;  . APPENDECTOMY    . BACK SURGERY     herniated disc  . CARDIOVERSION N/A 08/09/2012   Procedure: CARDIOVERSION;  Surgeon: Sueanne Margarita, MD;  Location: [facility];  Service: Cardiovascular;  Laterality: N/A;  . CARDIOVERSION N/A 09/28/2014   Procedure: CARDIOVERSION;  Surgeon: Sueanne Margarita, MD;  Location: [facility] ENDOSCOPY;  Service: Cardiovascular;  Laterality: N/A;  . CHOLECYSTECTOMY    . TEE WITHOUT CARDIOVERSION N/A 08/09/2012   Procedure: TRANSESOPHAGEAL ECHOCARDIOGRAM (TEE);  Surgeon: Sueanne Margarita, MD;  Location: [facility] ENDOSCOPY;  Service: Cardiovascular;  Laterality: N/A;  talk to angie in [place].  . TONSILLECTOMY      OB History    No data available       Home Medications    Prior to Admission medications   Medication Sig Start Date End Date Taking?  Authorizing Provider  amiodarone (PACERONE) 200 MG tablet Take 1 tablet (200 mg total) by mouth daily. 10/20/16   Rosita Fire, MD  buPROPion [facility] SR) 150 MG 12 hr tablet Take 150 mg 2 (two) times daily by mouth.    [provider]  diltiazem (CARDIZEM CD) 240 MG 24 hr capsule Take 1 capsule (240 mg total) by mouth daily. 12/16/16 03/16/17  Sueanne Margarita, MD  docusate sodium (COLACE) 100 MG capsule Take 200 mg by mouth at bedtime.     [provider]    donepezil (ARICEPT) 10 MG tablet Take 10 mg by mouth at bedtime.    [provider]  ELIQUIS 2.5 MG TABS tablet TAKE 1 TABLET (2.5 MG TOTAL) BY MOUTH 2 (TWO) TIMES DAILY. 04/21/16   Sueanne Margarita, MD  furosemide (LASIX) 40 MG tablet Take 1 tablet (40 mg total) by mouth daily. 12/27/16   Lavina Hamman, MD  ibuprofen (ADVIL,MOTRIN) 200 MG tablet Take 1 tablet (200 mg total) by mouth every 8 (eight) hours as needed. Patient not taking: Reported on 02/15/2017 12/26/16   Lavina Hamman, MD  iron polysaccharides (NIFEREX) 150 MG capsule Take 1 capsule (150 mg total) by mouth daily. 09/30/16   Barton Dubois, MD  Melatonin 10 MG TABS Take 30 mg by mouth at bedtime.    [provider]  neomycin-bacitracin-polymyxin (NEOSPORIN) [phone] ointment Apply 1 application daily topically. Left leg after dressing change.    [provider]  polyethylene glycol (MIRALAX / GLYCOLAX) packet Take 17 g by mouth daily.    [provider]  potassium chloride SA (KLOR-CON M20) 20 MEQ tablet Take 2 tablets (40 mEq total) by mouth every other day. Patient taking differently: Take 20 mEq daily by mouth.  05/23/16   Sueanne Margarita, MD  rosuvastatin (CRESTOR) 5 MG tablet Take 5 mg by mouth at bedtime.     [provider]  sitaGLIPtin (JANUVIA) 100 MG tablet Take 100 mg by mouth daily.    [provider]    Family History Family History  Problem Relation Age of Onset  . Heart disease Mother   . CAD Mother   . Heart disease Father   . CAD Father   . Pancreatic cancer Brother     Social History Social History   Tobacco Use  . Smoking status: Former Research scientist (life sciences)  . Smokeless tobacco: Never Used  Substance Use Topics  . Alcohol use: Yes    Alcohol/week: 0.0 oz    Types: 1 - 2 Glasses of wine per week  . Drug use: No     Allergies   Codeine and Lortab [hydrocodone-acetaminophen]   Review of Systems Review of Systems All other systems negative except as  documented in the HPI. All pertinent positives and negatives as reviewed in the HPI.  Physical Exam Updated Vital Signs BP (!) 159/78 (BP Location: Right Arm)   Pulse 63   Temp 97.8 F (36.6 C) (Oral)   SpO2 100%   Physical Exam  Constitutional: She is oriented to person, place, and time. She appears well-developed and well-nourished. No distress.  HENT:  Head: Normocephalic and atraumatic.  Eyes: Pupils are equal, round, and reactive to light.  Pulmonary/Chest: Effort normal.  Musculoskeletal:       Right hip: She exhibits tenderness. She exhibits normal range of motion, normal strength, no bony tenderness, no swelling, no crepitus and no deformity.       Lumbar back: Normal. She exhibits normal  range of motion, no tenderness, no bony tenderness, no swelling, no deformity, no pain and no spasm.       Legs: Neurological: She is alert and oriented to person, place, and time.  Skin: Skin is warm and dry.  Psychiatric: She has a normal mood and affect.  Nursing note and vitals reviewed.    ED Treatments / Results  Labs (all labs ordered are listed, but only abnormal results are displayed) Labs Reviewed - No data to display  EKG  EKG Interpretation None       Radiology Dg Hip Unilat W Or Wo Pelvis 2-3 Views Right  Result Date: 04/12/2017 CLINICAL DATA:  Right hip and proximal leg pain following a hip replacement approximately 6 months ago. EXAM: DG HIP (WITH OR WITHOUT PELVIS) 2-3V RIGHT COMPARISON:  09/28/2016. FINDINGS: Again demonstrated is a right hip prosthesis in satisfactory position and alignment. No acute fracture or periprosthetic lucency. Heterotopic bone formation medially and laterally. Old, healed right inferior and superior pubic ramus fractures. IMPRESSION: Satisfactory appearance of a right hip prosthesis with interval extensive lateral and medial heterotopic bone formation. Electronically Signed   By: Claudie Revering M.D.   On: 04/12/2017 13:48     Procedures Procedures (including critical care time)  Medications Ordered in ED Medications - No data to display   Initial Impression / Assessment and Plan / ED Course  I have reviewed the triage vital signs and the nursing notes.  Pertinent labs & imaging results that were available during my care of the patient were reviewed by me and considered in my medical decision making (see chart for details).     Patient will be referred back to her orthopedist.  I advised her to use heat over the area will give some mild pain control as well.  The x-rays do not show any significant abnormalities at this time.  Final Clinical Impressions(s) / ED Diagnoses   Final diagnoses:  None    ED Discharge Orders    None       Dalia Heading, PA-C 04/12/17 1456    Virgel Manifold, MD 04/12/17 [phone]

## 2017-04-12 NOTE — ED Notes (Signed)
Bed: WA02 Expected date:  Expected time:  Means of arrival:  Comments: 84 yo leg pain

## 2017-04-14 DIAGNOSIS — F331 Major depressive disorder, recurrent, moderate: Secondary | ICD-10-CM | POA: Diagnosis not present

## 2017-04-14 DIAGNOSIS — F419 Anxiety disorder, unspecified: Secondary | ICD-10-CM | POA: Diagnosis not present

## 2017-04-17 DIAGNOSIS — Z0000 Encounter for general adult medical examination without abnormal findings: Secondary | ICD-10-CM | POA: Diagnosis not present

## 2017-04-21 DIAGNOSIS — F419 Anxiety disorder, unspecified: Secondary | ICD-10-CM | POA: Diagnosis not present

## 2017-04-21 DIAGNOSIS — F331 Major depressive disorder, recurrent, moderate: Secondary | ICD-10-CM | POA: Diagnosis not present

## 2017-04-22 ENCOUNTER — Ambulatory Visit: Payer: Medicare Other | Admitting: Cardiology

## 2017-04-23 DIAGNOSIS — F331 Major depressive disorder, recurrent, moderate: Secondary | ICD-10-CM | POA: Diagnosis not present

## 2017-04-23 DIAGNOSIS — F5105 Insomnia due to other mental disorder: Secondary | ICD-10-CM | POA: Diagnosis not present

## 2017-04-23 DIAGNOSIS — G301 Alzheimer's disease with late onset: Secondary | ICD-10-CM | POA: Diagnosis not present

## 2017-04-23 DIAGNOSIS — F0280 Dementia in other diseases classified elsewhere without behavioral disturbance: Secondary | ICD-10-CM | POA: Diagnosis not present

## 2017-04-24 DIAGNOSIS — M25551 Pain in right hip: Secondary | ICD-10-CM | POA: Diagnosis not present

## 2017-04-28 DIAGNOSIS — F419 Anxiety disorder, unspecified: Secondary | ICD-10-CM | POA: Diagnosis not present

## 2017-04-28 DIAGNOSIS — F331 Major depressive disorder, recurrent, moderate: Secondary | ICD-10-CM | POA: Diagnosis not present

## 2017-04-29 DIAGNOSIS — F339 Major depressive disorder, recurrent, unspecified: Secondary | ICD-10-CM | POA: Diagnosis not present

## 2017-04-29 DIAGNOSIS — I10 Essential (primary) hypertension: Secondary | ICD-10-CM | POA: Diagnosis not present

## 2017-04-29 DIAGNOSIS — K5900 Constipation, unspecified: Secondary | ICD-10-CM | POA: Diagnosis not present

## 2017-04-29 DIAGNOSIS — I482 Chronic atrial fibrillation: Secondary | ICD-10-CM | POA: Diagnosis not present

## 2017-04-29 DIAGNOSIS — E785 Hyperlipidemia, unspecified: Secondary | ICD-10-CM | POA: Diagnosis not present

## 2017-04-29 DIAGNOSIS — I5030 Unspecified diastolic (congestive) heart failure: Secondary | ICD-10-CM | POA: Diagnosis not present

## 2017-04-29 DIAGNOSIS — E119 Type 2 diabetes mellitus without complications: Secondary | ICD-10-CM | POA: Diagnosis not present

## 2017-04-29 DIAGNOSIS — F0390 Unspecified dementia without behavioral disturbance: Secondary | ICD-10-CM | POA: Diagnosis not present

## 2017-04-29 DIAGNOSIS — F331 Major depressive disorder, recurrent, moderate: Secondary | ICD-10-CM | POA: Diagnosis not present

## 2017-04-29 DIAGNOSIS — F0150 Vascular dementia without behavioral disturbance: Secondary | ICD-10-CM | POA: Diagnosis not present

## 2017-04-29 DIAGNOSIS — E46 Unspecified protein-calorie malnutrition: Secondary | ICD-10-CM | POA: Diagnosis not present

## 2017-05-05 ENCOUNTER — Ambulatory Visit (INDEPENDENT_AMBULATORY_CARE_PROVIDER_SITE_OTHER): Payer: Medicare Other | Admitting: Orthopaedic Surgery

## 2017-05-05 DIAGNOSIS — F419 Anxiety disorder, unspecified: Secondary | ICD-10-CM | POA: Diagnosis not present

## 2017-05-05 DIAGNOSIS — F331 Major depressive disorder, recurrent, moderate: Secondary | ICD-10-CM | POA: Diagnosis not present

## 2017-05-12 DIAGNOSIS — F419 Anxiety disorder, unspecified: Secondary | ICD-10-CM | POA: Diagnosis not present

## 2017-05-12 DIAGNOSIS — F331 Major depressive disorder, recurrent, moderate: Secondary | ICD-10-CM | POA: Diagnosis not present

## 2017-05-15 DIAGNOSIS — E46 Unspecified protein-calorie malnutrition: Secondary | ICD-10-CM | POA: Diagnosis not present

## 2017-05-15 DIAGNOSIS — K5900 Constipation, unspecified: Secondary | ICD-10-CM | POA: Diagnosis not present

## 2017-05-15 DIAGNOSIS — I5030 Unspecified diastolic (congestive) heart failure: Secondary | ICD-10-CM | POA: Diagnosis not present

## 2017-05-15 DIAGNOSIS — R296 Repeated falls: Secondary | ICD-10-CM | POA: Diagnosis not present

## 2017-05-15 DIAGNOSIS — E785 Hyperlipidemia, unspecified: Secondary | ICD-10-CM | POA: Diagnosis not present

## 2017-05-15 DIAGNOSIS — R269 Unspecified abnormalities of gait and mobility: Secondary | ICD-10-CM | POA: Diagnosis not present

## 2017-05-15 DIAGNOSIS — I482 Chronic atrial fibrillation: Secondary | ICD-10-CM | POA: Diagnosis not present

## 2017-05-15 DIAGNOSIS — I10 Essential (primary) hypertension: Secondary | ICD-10-CM | POA: Diagnosis not present

## 2017-05-15 DIAGNOSIS — R32 Unspecified urinary incontinence: Secondary | ICD-10-CM | POA: Diagnosis not present

## 2017-05-15 DIAGNOSIS — M25551 Pain in right hip: Secondary | ICD-10-CM | POA: Diagnosis not present

## 2017-05-15 DIAGNOSIS — E119 Type 2 diabetes mellitus without complications: Secondary | ICD-10-CM | POA: Diagnosis not present

## 2017-05-19 DIAGNOSIS — F331 Major depressive disorder, recurrent, moderate: Secondary | ICD-10-CM | POA: Diagnosis not present

## 2017-05-19 DIAGNOSIS — F419 Anxiety disorder, unspecified: Secondary | ICD-10-CM | POA: Diagnosis not present

## 2017-05-26 DIAGNOSIS — F419 Anxiety disorder, unspecified: Secondary | ICD-10-CM | POA: Diagnosis not present

## 2017-05-26 DIAGNOSIS — F331 Major depressive disorder, recurrent, moderate: Secondary | ICD-10-CM | POA: Diagnosis not present

## 2017-05-27 DIAGNOSIS — E785 Hyperlipidemia, unspecified: Secondary | ICD-10-CM | POA: Diagnosis not present

## 2017-05-27 DIAGNOSIS — E46 Unspecified protein-calorie malnutrition: Secondary | ICD-10-CM | POA: Diagnosis not present

## 2017-05-27 DIAGNOSIS — I482 Chronic atrial fibrillation: Secondary | ICD-10-CM | POA: Diagnosis not present

## 2017-05-27 DIAGNOSIS — I10 Essential (primary) hypertension: Secondary | ICD-10-CM | POA: Diagnosis not present

## 2017-05-27 DIAGNOSIS — I5030 Unspecified diastolic (congestive) heart failure: Secondary | ICD-10-CM | POA: Diagnosis not present

## 2017-05-27 DIAGNOSIS — E119 Type 2 diabetes mellitus without complications: Secondary | ICD-10-CM | POA: Diagnosis not present

## 2017-06-02 DIAGNOSIS — F419 Anxiety disorder, unspecified: Secondary | ICD-10-CM | POA: Diagnosis not present

## 2017-06-02 DIAGNOSIS — F331 Major depressive disorder, recurrent, moderate: Secondary | ICD-10-CM | POA: Diagnosis not present

## 2017-06-04 DIAGNOSIS — F0280 Dementia in other diseases classified elsewhere without behavioral disturbance: Secondary | ICD-10-CM | POA: Diagnosis not present

## 2017-06-04 DIAGNOSIS — F331 Major depressive disorder, recurrent, moderate: Secondary | ICD-10-CM | POA: Diagnosis not present

## 2017-06-04 DIAGNOSIS — F5105 Insomnia due to other mental disorder: Secondary | ICD-10-CM | POA: Diagnosis not present

## 2017-06-04 DIAGNOSIS — G301 Alzheimer's disease with late onset: Secondary | ICD-10-CM | POA: Diagnosis not present

## 2017-06-09 DIAGNOSIS — F331 Major depressive disorder, recurrent, moderate: Secondary | ICD-10-CM | POA: Diagnosis not present

## 2017-06-09 DIAGNOSIS — F419 Anxiety disorder, unspecified: Secondary | ICD-10-CM | POA: Diagnosis not present

## 2017-06-16 DIAGNOSIS — F331 Major depressive disorder, recurrent, moderate: Secondary | ICD-10-CM | POA: Diagnosis not present

## 2017-06-16 DIAGNOSIS — F419 Anxiety disorder, unspecified: Secondary | ICD-10-CM | POA: Diagnosis not present

## 2017-06-18 DIAGNOSIS — I10 Essential (primary) hypertension: Secondary | ICD-10-CM | POA: Diagnosis not present

## 2017-06-18 DIAGNOSIS — M25551 Pain in right hip: Secondary | ICD-10-CM | POA: Diagnosis not present

## 2017-06-18 DIAGNOSIS — R269 Unspecified abnormalities of gait and mobility: Secondary | ICD-10-CM | POA: Diagnosis not present

## 2017-06-18 DIAGNOSIS — Z9181 History of falling: Secondary | ICD-10-CM | POA: Diagnosis not present

## 2017-06-18 DIAGNOSIS — I5032 Chronic diastolic (congestive) heart failure: Secondary | ICD-10-CM | POA: Diagnosis not present

## 2017-06-18 DIAGNOSIS — E785 Hyperlipidemia, unspecified: Secondary | ICD-10-CM | POA: Diagnosis not present

## 2017-06-18 DIAGNOSIS — R32 Unspecified urinary incontinence: Secondary | ICD-10-CM | POA: Diagnosis not present

## 2017-06-18 DIAGNOSIS — K5900 Constipation, unspecified: Secondary | ICD-10-CM | POA: Diagnosis not present

## 2017-06-18 DIAGNOSIS — E119 Type 2 diabetes mellitus without complications: Secondary | ICD-10-CM | POA: Diagnosis not present

## 2017-06-18 DIAGNOSIS — E46 Unspecified protein-calorie malnutrition: Secondary | ICD-10-CM | POA: Diagnosis not present

## 2017-06-18 DIAGNOSIS — I482 Chronic atrial fibrillation: Secondary | ICD-10-CM | POA: Diagnosis not present

## 2017-06-19 DIAGNOSIS — E46 Unspecified protein-calorie malnutrition: Secondary | ICD-10-CM | POA: Diagnosis not present

## 2017-06-19 DIAGNOSIS — M25551 Pain in right hip: Secondary | ICD-10-CM | POA: Diagnosis not present

## 2017-06-19 DIAGNOSIS — I10 Essential (primary) hypertension: Secondary | ICD-10-CM | POA: Diagnosis not present

## 2017-06-19 DIAGNOSIS — I482 Chronic atrial fibrillation: Secondary | ICD-10-CM | POA: Diagnosis not present

## 2017-06-19 DIAGNOSIS — K5900 Constipation, unspecified: Secondary | ICD-10-CM | POA: Diagnosis not present

## 2017-06-19 DIAGNOSIS — R296 Repeated falls: Secondary | ICD-10-CM | POA: Diagnosis not present

## 2017-06-19 DIAGNOSIS — R269 Unspecified abnormalities of gait and mobility: Secondary | ICD-10-CM | POA: Diagnosis not present

## 2017-06-19 DIAGNOSIS — I5030 Unspecified diastolic (congestive) heart failure: Secondary | ICD-10-CM | POA: Diagnosis not present

## 2017-06-19 DIAGNOSIS — R32 Unspecified urinary incontinence: Secondary | ICD-10-CM | POA: Diagnosis not present

## 2017-06-19 DIAGNOSIS — E785 Hyperlipidemia, unspecified: Secondary | ICD-10-CM | POA: Diagnosis not present

## 2017-06-19 DIAGNOSIS — E119 Type 2 diabetes mellitus without complications: Secondary | ICD-10-CM | POA: Diagnosis not present

## 2017-06-23 DIAGNOSIS — F331 Major depressive disorder, recurrent, moderate: Secondary | ICD-10-CM | POA: Diagnosis not present

## 2017-06-23 DIAGNOSIS — R32 Unspecified urinary incontinence: Secondary | ICD-10-CM | POA: Diagnosis not present

## 2017-06-23 DIAGNOSIS — F419 Anxiety disorder, unspecified: Secondary | ICD-10-CM | POA: Diagnosis not present

## 2017-07-03 DIAGNOSIS — F5105 Insomnia due to other mental disorder: Secondary | ICD-10-CM | POA: Diagnosis not present

## 2017-07-03 DIAGNOSIS — M545 Low back pain: Secondary | ICD-10-CM | POA: Diagnosis not present

## 2017-07-03 DIAGNOSIS — R296 Repeated falls: Secondary | ICD-10-CM | POA: Diagnosis not present

## 2017-07-03 DIAGNOSIS — G301 Alzheimer's disease with late onset: Secondary | ICD-10-CM | POA: Diagnosis not present

## 2017-07-03 DIAGNOSIS — F331 Major depressive disorder, recurrent, moderate: Secondary | ICD-10-CM | POA: Diagnosis not present

## 2017-07-03 DIAGNOSIS — F0280 Dementia in other diseases classified elsewhere without behavioral disturbance: Secondary | ICD-10-CM | POA: Diagnosis not present

## 2017-07-03 DIAGNOSIS — M546 Pain in thoracic spine: Secondary | ICD-10-CM | POA: Diagnosis not present

## 2017-07-10 DIAGNOSIS — R2681 Unsteadiness on feet: Secondary | ICD-10-CM | POA: Diagnosis not present

## 2017-07-10 DIAGNOSIS — N390 Urinary tract infection, site not specified: Secondary | ICD-10-CM | POA: Diagnosis not present

## 2017-07-10 DIAGNOSIS — M6281 Muscle weakness (generalized): Secondary | ICD-10-CM | POA: Diagnosis not present

## 2017-07-10 DIAGNOSIS — M545 Low back pain: Secondary | ICD-10-CM | POA: Diagnosis not present

## 2017-07-10 DIAGNOSIS — R296 Repeated falls: Secondary | ICD-10-CM | POA: Diagnosis not present

## 2017-07-13 DIAGNOSIS — F0150 Vascular dementia without behavioral disturbance: Secondary | ICD-10-CM | POA: Diagnosis not present

## 2017-07-15 DIAGNOSIS — I110 Hypertensive heart disease with heart failure: Secondary | ICD-10-CM | POA: Diagnosis not present

## 2017-07-15 DIAGNOSIS — Z7901 Long term (current) use of anticoagulants: Secondary | ICD-10-CM | POA: Diagnosis not present

## 2017-07-15 DIAGNOSIS — F329 Major depressive disorder, single episode, unspecified: Secondary | ICD-10-CM | POA: Diagnosis not present

## 2017-07-15 DIAGNOSIS — E119 Type 2 diabetes mellitus without complications: Secondary | ICD-10-CM | POA: Diagnosis not present

## 2017-07-15 DIAGNOSIS — Z87440 Personal history of urinary (tract) infections: Secondary | ICD-10-CM | POA: Diagnosis not present

## 2017-07-15 DIAGNOSIS — R2681 Unsteadiness on feet: Secondary | ICD-10-CM | POA: Diagnosis not present

## 2017-07-15 DIAGNOSIS — I5032 Chronic diastolic (congestive) heart failure: Secondary | ICD-10-CM | POA: Diagnosis not present

## 2017-07-15 DIAGNOSIS — F0390 Unspecified dementia without behavioral disturbance: Secondary | ICD-10-CM | POA: Diagnosis not present

## 2017-07-15 DIAGNOSIS — Z9181 History of falling: Secondary | ICD-10-CM | POA: Diagnosis not present

## 2017-07-15 DIAGNOSIS — Z7984 Long term (current) use of oral hypoglycemic drugs: Secondary | ICD-10-CM | POA: Diagnosis not present

## 2017-07-15 DIAGNOSIS — I4891 Unspecified atrial fibrillation: Secondary | ICD-10-CM | POA: Diagnosis not present

## 2017-07-15 DIAGNOSIS — R531 Weakness: Secondary | ICD-10-CM | POA: Diagnosis not present

## 2017-07-18 DIAGNOSIS — I110 Hypertensive heart disease with heart failure: Secondary | ICD-10-CM | POA: Diagnosis not present

## 2017-07-18 DIAGNOSIS — I5032 Chronic diastolic (congestive) heart failure: Secondary | ICD-10-CM | POA: Diagnosis not present

## 2017-07-18 DIAGNOSIS — R2681 Unsteadiness on feet: Secondary | ICD-10-CM | POA: Diagnosis not present

## 2017-07-18 DIAGNOSIS — R531 Weakness: Secondary | ICD-10-CM | POA: Diagnosis not present

## 2017-07-18 DIAGNOSIS — E119 Type 2 diabetes mellitus without complications: Secondary | ICD-10-CM | POA: Diagnosis not present

## 2017-07-18 DIAGNOSIS — F0390 Unspecified dementia without behavioral disturbance: Secondary | ICD-10-CM | POA: Diagnosis not present

## 2017-07-20 DIAGNOSIS — R531 Weakness: Secondary | ICD-10-CM | POA: Diagnosis not present

## 2017-07-20 DIAGNOSIS — I5032 Chronic diastolic (congestive) heart failure: Secondary | ICD-10-CM | POA: Diagnosis not present

## 2017-07-20 DIAGNOSIS — I110 Hypertensive heart disease with heart failure: Secondary | ICD-10-CM | POA: Diagnosis not present

## 2017-07-20 DIAGNOSIS — E119 Type 2 diabetes mellitus without complications: Secondary | ICD-10-CM | POA: Diagnosis not present

## 2017-07-20 DIAGNOSIS — F0390 Unspecified dementia without behavioral disturbance: Secondary | ICD-10-CM | POA: Diagnosis not present

## 2017-07-20 DIAGNOSIS — R2681 Unsteadiness on feet: Secondary | ICD-10-CM | POA: Diagnosis not present

## 2017-07-21 DIAGNOSIS — F0390 Unspecified dementia without behavioral disturbance: Secondary | ICD-10-CM | POA: Diagnosis not present

## 2017-07-21 DIAGNOSIS — R531 Weakness: Secondary | ICD-10-CM | POA: Diagnosis not present

## 2017-07-21 DIAGNOSIS — I110 Hypertensive heart disease with heart failure: Secondary | ICD-10-CM | POA: Diagnosis not present

## 2017-07-21 DIAGNOSIS — I5032 Chronic diastolic (congestive) heart failure: Secondary | ICD-10-CM | POA: Diagnosis not present

## 2017-07-21 DIAGNOSIS — E119 Type 2 diabetes mellitus without complications: Secondary | ICD-10-CM | POA: Diagnosis not present

## 2017-07-21 DIAGNOSIS — R2681 Unsteadiness on feet: Secondary | ICD-10-CM | POA: Diagnosis not present

## 2017-07-23 DIAGNOSIS — E119 Type 2 diabetes mellitus without complications: Secondary | ICD-10-CM | POA: Diagnosis not present

## 2017-07-23 DIAGNOSIS — I5032 Chronic diastolic (congestive) heart failure: Secondary | ICD-10-CM | POA: Diagnosis not present

## 2017-07-23 DIAGNOSIS — F0390 Unspecified dementia without behavioral disturbance: Secondary | ICD-10-CM | POA: Diagnosis not present

## 2017-07-23 DIAGNOSIS — R531 Weakness: Secondary | ICD-10-CM | POA: Diagnosis not present

## 2017-07-23 DIAGNOSIS — R2681 Unsteadiness on feet: Secondary | ICD-10-CM | POA: Diagnosis not present

## 2017-07-23 DIAGNOSIS — I110 Hypertensive heart disease with heart failure: Secondary | ICD-10-CM | POA: Diagnosis not present

## 2017-07-24 DIAGNOSIS — M25551 Pain in right hip: Secondary | ICD-10-CM | POA: Diagnosis not present

## 2017-07-24 DIAGNOSIS — I482 Chronic atrial fibrillation: Secondary | ICD-10-CM | POA: Diagnosis not present

## 2017-07-24 DIAGNOSIS — R269 Unspecified abnormalities of gait and mobility: Secondary | ICD-10-CM | POA: Diagnosis not present

## 2017-07-24 DIAGNOSIS — E46 Unspecified protein-calorie malnutrition: Secondary | ICD-10-CM | POA: Diagnosis not present

## 2017-07-24 DIAGNOSIS — R32 Unspecified urinary incontinence: Secondary | ICD-10-CM | POA: Diagnosis not present

## 2017-07-24 DIAGNOSIS — I10 Essential (primary) hypertension: Secondary | ICD-10-CM | POA: Diagnosis not present

## 2017-07-24 DIAGNOSIS — I5030 Unspecified diastolic (congestive) heart failure: Secondary | ICD-10-CM | POA: Diagnosis not present

## 2017-07-24 DIAGNOSIS — E785 Hyperlipidemia, unspecified: Secondary | ICD-10-CM | POA: Diagnosis not present

## 2017-07-24 DIAGNOSIS — R296 Repeated falls: Secondary | ICD-10-CM | POA: Diagnosis not present

## 2017-07-24 DIAGNOSIS — K5900 Constipation, unspecified: Secondary | ICD-10-CM | POA: Diagnosis not present

## 2017-07-24 DIAGNOSIS — E119 Type 2 diabetes mellitus without complications: Secondary | ICD-10-CM | POA: Diagnosis not present

## 2017-07-27 DIAGNOSIS — I110 Hypertensive heart disease with heart failure: Secondary | ICD-10-CM | POA: Diagnosis not present

## 2017-07-27 DIAGNOSIS — R2681 Unsteadiness on feet: Secondary | ICD-10-CM | POA: Diagnosis not present

## 2017-07-27 DIAGNOSIS — E119 Type 2 diabetes mellitus without complications: Secondary | ICD-10-CM | POA: Diagnosis not present

## 2017-07-27 DIAGNOSIS — F0390 Unspecified dementia without behavioral disturbance: Secondary | ICD-10-CM | POA: Diagnosis not present

## 2017-07-27 DIAGNOSIS — R531 Weakness: Secondary | ICD-10-CM | POA: Diagnosis not present

## 2017-07-27 DIAGNOSIS — I5032 Chronic diastolic (congestive) heart failure: Secondary | ICD-10-CM | POA: Diagnosis not present

## 2017-07-28 DIAGNOSIS — F419 Anxiety disorder, unspecified: Secondary | ICD-10-CM | POA: Diagnosis not present

## 2017-07-28 DIAGNOSIS — F331 Major depressive disorder, recurrent, moderate: Secondary | ICD-10-CM | POA: Diagnosis not present

## 2017-07-29 DIAGNOSIS — R2681 Unsteadiness on feet: Secondary | ICD-10-CM | POA: Diagnosis not present

## 2017-07-29 DIAGNOSIS — E119 Type 2 diabetes mellitus without complications: Secondary | ICD-10-CM | POA: Diagnosis not present

## 2017-07-29 DIAGNOSIS — F0390 Unspecified dementia without behavioral disturbance: Secondary | ICD-10-CM | POA: Diagnosis not present

## 2017-07-29 DIAGNOSIS — R531 Weakness: Secondary | ICD-10-CM | POA: Diagnosis not present

## 2017-07-29 DIAGNOSIS — I110 Hypertensive heart disease with heart failure: Secondary | ICD-10-CM | POA: Diagnosis not present

## 2017-07-29 DIAGNOSIS — I5032 Chronic diastolic (congestive) heart failure: Secondary | ICD-10-CM | POA: Diagnosis not present

## 2017-07-30 DIAGNOSIS — I110 Hypertensive heart disease with heart failure: Secondary | ICD-10-CM | POA: Diagnosis not present

## 2017-07-30 DIAGNOSIS — I5032 Chronic diastolic (congestive) heart failure: Secondary | ICD-10-CM | POA: Diagnosis not present

## 2017-07-30 DIAGNOSIS — F0390 Unspecified dementia without behavioral disturbance: Secondary | ICD-10-CM | POA: Diagnosis not present

## 2017-07-30 DIAGNOSIS — E119 Type 2 diabetes mellitus without complications: Secondary | ICD-10-CM | POA: Diagnosis not present

## 2017-07-30 DIAGNOSIS — R2681 Unsteadiness on feet: Secondary | ICD-10-CM | POA: Diagnosis not present

## 2017-07-30 DIAGNOSIS — R531 Weakness: Secondary | ICD-10-CM | POA: Diagnosis not present

## 2017-07-31 DIAGNOSIS — F5105 Insomnia due to other mental disorder: Secondary | ICD-10-CM | POA: Diagnosis not present

## 2017-07-31 DIAGNOSIS — F0280 Dementia in other diseases classified elsewhere without behavioral disturbance: Secondary | ICD-10-CM | POA: Diagnosis not present

## 2017-07-31 DIAGNOSIS — G301 Alzheimer's disease with late onset: Secondary | ICD-10-CM | POA: Diagnosis not present

## 2017-07-31 DIAGNOSIS — F331 Major depressive disorder, recurrent, moderate: Secondary | ICD-10-CM | POA: Diagnosis not present

## 2017-08-03 DIAGNOSIS — I5032 Chronic diastolic (congestive) heart failure: Secondary | ICD-10-CM | POA: Diagnosis not present

## 2017-08-03 DIAGNOSIS — E119 Type 2 diabetes mellitus without complications: Secondary | ICD-10-CM | POA: Diagnosis not present

## 2017-08-03 DIAGNOSIS — R531 Weakness: Secondary | ICD-10-CM | POA: Diagnosis not present

## 2017-08-03 DIAGNOSIS — I110 Hypertensive heart disease with heart failure: Secondary | ICD-10-CM | POA: Diagnosis not present

## 2017-08-03 DIAGNOSIS — R2681 Unsteadiness on feet: Secondary | ICD-10-CM | POA: Diagnosis not present

## 2017-08-03 DIAGNOSIS — F0390 Unspecified dementia without behavioral disturbance: Secondary | ICD-10-CM | POA: Diagnosis not present

## 2017-08-05 DIAGNOSIS — I5032 Chronic diastolic (congestive) heart failure: Secondary | ICD-10-CM | POA: Diagnosis not present

## 2017-08-05 DIAGNOSIS — I110 Hypertensive heart disease with heart failure: Secondary | ICD-10-CM | POA: Diagnosis not present

## 2017-08-05 DIAGNOSIS — E119 Type 2 diabetes mellitus without complications: Secondary | ICD-10-CM | POA: Diagnosis not present

## 2017-08-05 DIAGNOSIS — F0390 Unspecified dementia without behavioral disturbance: Secondary | ICD-10-CM | POA: Diagnosis not present

## 2017-08-05 DIAGNOSIS — R2681 Unsteadiness on feet: Secondary | ICD-10-CM | POA: Diagnosis not present

## 2017-08-05 DIAGNOSIS — R531 Weakness: Secondary | ICD-10-CM | POA: Diagnosis not present

## 2017-08-06 DIAGNOSIS — F0390 Unspecified dementia without behavioral disturbance: Secondary | ICD-10-CM | POA: Diagnosis not present

## 2017-08-06 DIAGNOSIS — R2681 Unsteadiness on feet: Secondary | ICD-10-CM | POA: Diagnosis not present

## 2017-08-06 DIAGNOSIS — I5032 Chronic diastolic (congestive) heart failure: Secondary | ICD-10-CM | POA: Diagnosis not present

## 2017-08-06 DIAGNOSIS — E119 Type 2 diabetes mellitus without complications: Secondary | ICD-10-CM | POA: Diagnosis not present

## 2017-08-06 DIAGNOSIS — I110 Hypertensive heart disease with heart failure: Secondary | ICD-10-CM | POA: Diagnosis not present

## 2017-08-06 DIAGNOSIS — R531 Weakness: Secondary | ICD-10-CM | POA: Diagnosis not present

## 2017-08-11 DIAGNOSIS — R2681 Unsteadiness on feet: Secondary | ICD-10-CM | POA: Diagnosis not present

## 2017-08-11 DIAGNOSIS — E119 Type 2 diabetes mellitus without complications: Secondary | ICD-10-CM | POA: Diagnosis not present

## 2017-08-11 DIAGNOSIS — I110 Hypertensive heart disease with heart failure: Secondary | ICD-10-CM | POA: Diagnosis not present

## 2017-08-11 DIAGNOSIS — R531 Weakness: Secondary | ICD-10-CM | POA: Diagnosis not present

## 2017-08-11 DIAGNOSIS — I5032 Chronic diastolic (congestive) heart failure: Secondary | ICD-10-CM | POA: Diagnosis not present

## 2017-08-11 DIAGNOSIS — F0390 Unspecified dementia without behavioral disturbance: Secondary | ICD-10-CM | POA: Diagnosis not present

## 2017-08-11 DIAGNOSIS — F331 Major depressive disorder, recurrent, moderate: Secondary | ICD-10-CM | POA: Diagnosis not present

## 2017-08-11 DIAGNOSIS — F419 Anxiety disorder, unspecified: Secondary | ICD-10-CM | POA: Diagnosis not present

## 2017-08-13 DIAGNOSIS — F0390 Unspecified dementia without behavioral disturbance: Secondary | ICD-10-CM | POA: Diagnosis not present

## 2017-08-13 DIAGNOSIS — R2681 Unsteadiness on feet: Secondary | ICD-10-CM | POA: Diagnosis not present

## 2017-08-13 DIAGNOSIS — R531 Weakness: Secondary | ICD-10-CM | POA: Diagnosis not present

## 2017-08-13 DIAGNOSIS — I110 Hypertensive heart disease with heart failure: Secondary | ICD-10-CM | POA: Diagnosis not present

## 2017-08-13 DIAGNOSIS — E119 Type 2 diabetes mellitus without complications: Secondary | ICD-10-CM | POA: Diagnosis not present

## 2017-08-13 DIAGNOSIS — I5032 Chronic diastolic (congestive) heart failure: Secondary | ICD-10-CM | POA: Diagnosis not present

## 2017-08-14 DIAGNOSIS — F331 Major depressive disorder, recurrent, moderate: Secondary | ICD-10-CM | POA: Diagnosis not present

## 2017-08-14 DIAGNOSIS — F0280 Dementia in other diseases classified elsewhere without behavioral disturbance: Secondary | ICD-10-CM | POA: Diagnosis not present

## 2017-08-14 DIAGNOSIS — G301 Alzheimer's disease with late onset: Secondary | ICD-10-CM | POA: Diagnosis not present

## 2017-08-14 DIAGNOSIS — F5105 Insomnia due to other mental disorder: Secondary | ICD-10-CM | POA: Diagnosis not present

## 2017-08-17 DIAGNOSIS — R531 Weakness: Secondary | ICD-10-CM | POA: Diagnosis not present

## 2017-08-17 DIAGNOSIS — R2681 Unsteadiness on feet: Secondary | ICD-10-CM | POA: Diagnosis not present

## 2017-08-17 DIAGNOSIS — E119 Type 2 diabetes mellitus without complications: Secondary | ICD-10-CM | POA: Diagnosis not present

## 2017-08-17 DIAGNOSIS — I110 Hypertensive heart disease with heart failure: Secondary | ICD-10-CM | POA: Diagnosis not present

## 2017-08-17 DIAGNOSIS — F0390 Unspecified dementia without behavioral disturbance: Secondary | ICD-10-CM | POA: Diagnosis not present

## 2017-08-17 DIAGNOSIS — I5032 Chronic diastolic (congestive) heart failure: Secondary | ICD-10-CM | POA: Diagnosis not present

## 2017-08-18 DIAGNOSIS — M79662 Pain in left lower leg: Secondary | ICD-10-CM | POA: Diagnosis not present

## 2017-08-18 DIAGNOSIS — R4182 Altered mental status, unspecified: Secondary | ICD-10-CM | POA: Diagnosis not present

## 2017-08-18 DIAGNOSIS — R531 Weakness: Secondary | ICD-10-CM | POA: Diagnosis not present

## 2017-08-18 DIAGNOSIS — I110 Hypertensive heart disease with heart failure: Secondary | ICD-10-CM | POA: Diagnosis not present

## 2017-08-18 DIAGNOSIS — E119 Type 2 diabetes mellitus without complications: Secondary | ICD-10-CM | POA: Diagnosis not present

## 2017-08-18 DIAGNOSIS — R2681 Unsteadiness on feet: Secondary | ICD-10-CM | POA: Diagnosis not present

## 2017-08-18 DIAGNOSIS — F0390 Unspecified dementia without behavioral disturbance: Secondary | ICD-10-CM | POA: Diagnosis not present

## 2017-08-18 DIAGNOSIS — I5032 Chronic diastolic (congestive) heart failure: Secondary | ICD-10-CM | POA: Diagnosis not present

## 2017-08-19 DIAGNOSIS — R2681 Unsteadiness on feet: Secondary | ICD-10-CM | POA: Diagnosis not present

## 2017-08-19 DIAGNOSIS — R531 Weakness: Secondary | ICD-10-CM | POA: Diagnosis not present

## 2017-08-19 DIAGNOSIS — I110 Hypertensive heart disease with heart failure: Secondary | ICD-10-CM | POA: Diagnosis not present

## 2017-08-19 DIAGNOSIS — I5032 Chronic diastolic (congestive) heart failure: Secondary | ICD-10-CM | POA: Diagnosis not present

## 2017-08-19 DIAGNOSIS — E119 Type 2 diabetes mellitus without complications: Secondary | ICD-10-CM | POA: Diagnosis not present

## 2017-08-19 DIAGNOSIS — F0390 Unspecified dementia without behavioral disturbance: Secondary | ICD-10-CM | POA: Diagnosis not present

## 2017-08-21 DIAGNOSIS — M25551 Pain in right hip: Secondary | ICD-10-CM | POA: Diagnosis not present

## 2017-08-21 DIAGNOSIS — R2681 Unsteadiness on feet: Secondary | ICD-10-CM | POA: Diagnosis not present

## 2017-08-21 DIAGNOSIS — I5030 Unspecified diastolic (congestive) heart failure: Secondary | ICD-10-CM | POA: Diagnosis not present

## 2017-08-21 DIAGNOSIS — I5032 Chronic diastolic (congestive) heart failure: Secondary | ICD-10-CM | POA: Diagnosis not present

## 2017-08-21 DIAGNOSIS — E46 Unspecified protein-calorie malnutrition: Secondary | ICD-10-CM | POA: Diagnosis not present

## 2017-08-21 DIAGNOSIS — R32 Unspecified urinary incontinence: Secondary | ICD-10-CM | POA: Diagnosis not present

## 2017-08-21 DIAGNOSIS — F0390 Unspecified dementia without behavioral disturbance: Secondary | ICD-10-CM | POA: Diagnosis not present

## 2017-08-21 DIAGNOSIS — I482 Chronic atrial fibrillation: Secondary | ICD-10-CM | POA: Diagnosis not present

## 2017-08-21 DIAGNOSIS — R269 Unspecified abnormalities of gait and mobility: Secondary | ICD-10-CM | POA: Diagnosis not present

## 2017-08-21 DIAGNOSIS — I110 Hypertensive heart disease with heart failure: Secondary | ICD-10-CM | POA: Diagnosis not present

## 2017-08-21 DIAGNOSIS — I10 Essential (primary) hypertension: Secondary | ICD-10-CM | POA: Diagnosis not present

## 2017-08-21 DIAGNOSIS — E119 Type 2 diabetes mellitus without complications: Secondary | ICD-10-CM | POA: Diagnosis not present

## 2017-08-21 DIAGNOSIS — K5900 Constipation, unspecified: Secondary | ICD-10-CM | POA: Diagnosis not present

## 2017-08-21 DIAGNOSIS — R531 Weakness: Secondary | ICD-10-CM | POA: Diagnosis not present

## 2017-08-21 DIAGNOSIS — R296 Repeated falls: Secondary | ICD-10-CM | POA: Diagnosis not present

## 2017-08-21 DIAGNOSIS — E785 Hyperlipidemia, unspecified: Secondary | ICD-10-CM | POA: Diagnosis not present

## 2017-08-23 ENCOUNTER — Encounter (HOSPITAL_COMMUNITY): Payer: Self-pay

## 2017-08-23 ENCOUNTER — Emergency Department (HOSPITAL_COMMUNITY)
Admission: EM | Admit: 2017-08-23 | Discharge: 2017-08-23 | Disposition: A | Discharge status: Home / Self Care | Payer: Medicare Other | Attending: Emergency Medicine | Admitting: Emergency Medicine

## 2017-08-23 ENCOUNTER — Emergency Department (HOSPITAL_COMMUNITY): Payer: Medicare Other

## 2017-08-23 DIAGNOSIS — Z96641 Presence of right artificial hip joint: Secondary | ICD-10-CM | POA: Insufficient documentation

## 2017-08-23 DIAGNOSIS — M2550 Pain in unspecified joint: Secondary | ICD-10-CM | POA: Diagnosis not present

## 2017-08-23 DIAGNOSIS — M25551 Pain in right hip: Secondary | ICD-10-CM

## 2017-08-23 DIAGNOSIS — M79605 Pain in left leg: Secondary | ICD-10-CM | POA: Diagnosis not present

## 2017-08-23 DIAGNOSIS — Z7401 Bed confinement status: Secondary | ICD-10-CM | POA: Diagnosis not present

## 2017-08-23 DIAGNOSIS — Z7901 Long term (current) use of anticoagulants: Secondary | ICD-10-CM | POA: Diagnosis not present

## 2017-08-23 DIAGNOSIS — M25552 Pain in left hip: Secondary | ICD-10-CM | POA: Diagnosis not present

## 2017-08-23 DIAGNOSIS — I110 Hypertensive heart disease with heart failure: Secondary | ICD-10-CM | POA: Diagnosis not present

## 2017-08-23 DIAGNOSIS — E119 Type 2 diabetes mellitus without complications: Secondary | ICD-10-CM | POA: Insufficient documentation

## 2017-08-23 DIAGNOSIS — R030 Elevated blood-pressure reading, without diagnosis of hypertension: Secondary | ICD-10-CM | POA: Diagnosis not present

## 2017-08-23 DIAGNOSIS — Z7984 Long term (current) use of oral hypoglycemic drugs: Secondary | ICD-10-CM | POA: Insufficient documentation

## 2017-08-23 DIAGNOSIS — I83813 Varicose veins of bilateral lower extremities with pain: Secondary | ICD-10-CM | POA: Insufficient documentation

## 2017-08-23 DIAGNOSIS — M79606 Pain in leg, unspecified: Secondary | ICD-10-CM | POA: Diagnosis not present

## 2017-08-23 DIAGNOSIS — F0390 Unspecified dementia without behavioral disturbance: Secondary | ICD-10-CM | POA: Diagnosis not present

## 2017-08-23 DIAGNOSIS — Z79899 Other long term (current) drug therapy: Secondary | ICD-10-CM | POA: Diagnosis not present

## 2017-08-23 DIAGNOSIS — Z471 Aftercare following joint replacement surgery: Secondary | ICD-10-CM | POA: Diagnosis not present

## 2017-08-23 DIAGNOSIS — M79604 Pain in right leg: Secondary | ICD-10-CM | POA: Diagnosis not present

## 2017-08-23 DIAGNOSIS — Z87891 Personal history of nicotine dependence: Secondary | ICD-10-CM | POA: Diagnosis not present

## 2017-08-23 DIAGNOSIS — I5032 Chronic diastolic (congestive) heart failure: Secondary | ICD-10-CM | POA: Insufficient documentation

## 2017-08-23 MED ORDER — TRAMADOL HCL 50 MG PO TABS
50.0000 mg | ORAL_TABLET | Freq: Once | ORAL | Status: AC
  Administered 2017-08-23: 50 mg via ORAL
  Filled 2017-08-23: qty 1

## 2017-08-23 NOTE — ED Provider Notes (Signed)
[facility] DEPT Provider Note   CSN: [id number] Arrival date & time: 08/23/17  0737     History   Chief Complaint Chief Complaint  Patient presents with  . Hip Pain    HPI Cynthia Wright is a 84 y.o. female history of A. Fib on eliquis, hypertension, hyperlipidemia, CHF here presenting with bilateral leg pain.  Patient is from a nursing home and has a history of dementia at baseline.  Patient states that she has worsening bilateral leg pain for the last several weeks.  She denies any falls or injuries but this morning the pain got so bad so that she called EMS.  Patient is taking tramadol as needed but she states that she has not been getting them not much.  Patient also is on Eliquis and she states that she has some bruising on bilateral legs but denies any trauma or injury.  Denies any numbness or tingling of the legs or back pain.  The history is provided by the patient.    Past Medical History:  Diagnosis Date  . Atrial fibrillation ([place])    s/p DCCV 07/2012  . Benign diastolic hypertension   . Chronic anticoagulation   . Chronic diastolic CHF (congestive heart failure) (HCC)    EF now 65% by echo 10/2012 with grade II diasotlid dysfunction  . Diabetes mellitus without complication ([place])   . Dilated cardiomyopathy secondary to tachycardia ([place]) 07/2012   2D echo 10/2012 showed EF 65% with grade II diastolic dysfunction  . Elevated cholesterol   . Hyperlipidemia   . Hypertension   . Liver cyst   . Memory loss     Patient Active Problem List   Diagnosis Date Noted  . Elevated LFTs 12/22/2016  . HCAP (healthcare-associated pneumonia)   . Dementia with behavioral disturbance   . Hypokalemia   . Postoperative anemia   . Hyperlipidemia   . E. coli UTI   . Closed displaced fracture of right femoral neck ([place]) 09/28/2016  . Fracture of left leg 09/27/2016  . Fall 07/12/2016  . Leukocytosis 07/12/2016  . Type 2 diabetes mellitus with  hyperlipidemia ([place]) 07/12/2016  . Acetabular fracture ([place]) 07/11/2016  . Dilated cardiomyopathy secondary to tachycardia ([place]) 12/14/2013  . Syncope 06/14/2013  . Bradycardia 05/11/2013  . Fatigue 05/11/2013  . Chronic diastolic CHF (congestive heart failure) ([place]) 01/06/2013  . Mitral regurgitation 08/06/2012  . PAF (paroxysmal atrial fibrillation) ([place]) 08/02/2012  . Benign essential HTN 08/02/2012  . Pure hypercholesterolemia 08/02/2012  . Liver cyst 08/02/2012    Past Surgical History:  Procedure Laterality Date  . ANTERIOR APPROACH HEMI HIP ARTHROPLASTY Right 09/28/2016   Procedure: ANTERIOR APPROACH HEMI HIP ARTHROPLASTY;  Surgeon: Leandrew Koyanagi, MD;  Location: [facility];  Service: Orthopedics;  Laterality: Right;  . APPENDECTOMY    . BACK SURGERY     herniated disc  . CARDIOVERSION N/A 08/09/2012   Procedure: CARDIOVERSION;  Surgeon: Sueanne Margarita, MD;  Location: [facility];  Service: Cardiovascular;  Laterality: N/A;  . CARDIOVERSION N/A 09/28/2014   Procedure: CARDIOVERSION;  Surgeon: Sueanne Margarita, MD;  Location: [facility] ENDOSCOPY;  Service: Cardiovascular;  Laterality: N/A;  . CHOLECYSTECTOMY    . TEE WITHOUT CARDIOVERSION N/A 08/09/2012   Procedure: TRANSESOPHAGEAL ECHOCARDIOGRAM (TEE);  Surgeon: Sueanne Margarita, MD;  Location[facility] ENDOSCOPY;  Service: Cardiovascular;  Laterality: N/A;  talk to angie in [place].  . TONSILLECTOMY       OB History   None  Home Medications    Prior to Admission medications   Medication Sig Start Date End Date Taking? Authorizing Provider  acetaminophen (TYLENOL) 500 MG tablet Take 500 mg by mouth 2 (two) times daily. Take 500mg  by mouth two times a day for pain   Yes [provider]  amiodarone (PACERONE) 200 MG tablet Take 1 tablet (200 mg total) by mouth daily. 10/20/16  Yes Rosita Fire, MD  buPROPion [facility] SR) 150 MG 12 hr tablet Take 150 mg 2 (two) times daily by mouth.   Yes [provider]  busPIRone  (BUSPAR) 7.5 MG tablet Take 7.5 mg by mouth 2 (two) times daily. 08/14/17  Yes [provider]  diltiazem (CARDIZEM CD) 240 MG 24 hr capsule Take 1 capsule (240 mg total) by mouth daily. 12/16/16 08/23/17 Yes Turner, Eber Hong, MD  docusate sodium (COLACE) 100 MG capsule Take 200 mg by mouth at bedtime.    Yes [provider]  donepezil (ARICEPT) 10 MG tablet Take 10 mg by mouth at bedtime.   Yes [provider]  ELIQUIS 2.5 MG TABS tablet TAKE 1 TABLET (2.5 MG TOTAL) BY MOUTH 2 (TWO) TIMES DAILY. 04/21/16  Yes Turner, Eber Hong, MD  furosemide (LASIX) 40 MG tablet Take 1 tablet (40 mg total) by mouth daily. 12/27/16  Yes Lavina Hamman, MD  iron polysaccharides (NIFEREX) 150 MG capsule Take 1 capsule (150 mg total) by mouth daily. 09/30/16  Yes Barton Dubois, MD  KLOR-CON 20 MEQ packet Take 20 mEq by mouth daily. With food 07/31/17  Yes [provider]  lisinopril (PRINIVIL,ZESTRIL) 10 MG tablet Take 20 mg by mouth daily. 08/17/17  Yes [provider]  Melatonin 10 MG TABS Take 10 mg by mouth at bedtime.    Yes [provider]  memantine (NAMENDA) 5 MG tablet Take 5 mg by mouth daily.   Yes [provider]  polyethylene glycol (MIRALAX / GLYCOLAX) packet Take 17 g by mouth daily.   Yes [provider]  rosuvastatin (CRESTOR) 5 MG tablet Take 5 mg by mouth at bedtime.    Yes [provider]  sertraline (ZOLOFT) 25 MG tablet Take 25 mg by mouth daily. 08/14/17  Yes [provider]  sitaGLIPtin (JANUVIA) 100 MG tablet Take 100 mg by mouth daily.   Yes [provider]  traMADol (ULTRAM) 50 MG tablet Take 50 mg by mouth 2 (two) times daily. Take 50mg  by mouth twice a day for pain   Yes [provider]  ibuprofen (ADVIL,MOTRIN) 800 MG tablet Take 1 tablet (800 mg total) by mouth every 8 (eight) hours as needed. 04/12/17   Lawyer, Harrell Gave, PA-C  neomycin-bacitracin-polymyxin (NEOSPORIN) [phone] ointment  Apply 1 application daily topically. Left leg after dressing change.    [provider]  potassium chloride SA (KLOR-CON M20) 20 MEQ tablet Take 2 tablets (40 mEq total) by mouth every other day. Patient taking differently: Take 20 mEq daily by mouth.  05/23/16   Turner, Eber Hong, MD  traMADol (ULTRAM) 50 MG tablet Take 1 tablet (50 mg total) by mouth every 6 (six) hours as needed for severe pain. Patient not taking: Reported on 08/23/2017 04/12/17   Dalia Heading, PA-C    Family History Family History  Problem Relation Age of Onset  . Heart disease Mother   . CAD Mother   . Heart disease Father   . CAD Father   . Pancreatic cancer Brother     Social History Social History  Tobacco Use  . Smoking status: Former Research scientist (life sciences)  . Smokeless tobacco: Never Used  Substance Use Topics  . Alcohol use: Yes    Alcohol/week: 0.0 oz    Types: 1 - 2 Glasses of wine per week  . Drug use: No     Allergies   Codeine; Lortab [hydrocodone-acetaminophen]; and Hydrocodone   Review of Systems Review of Systems  Musculoskeletal:       Bilateral leg pain   All other systems reviewed and are negative.    Physical Exam Updated Vital Signs BP (!) 195/72 (BP Location: Right Arm)   Pulse 62   Temp 98.5 F (36.9 C) (Oral)   Resp 17   SpO2 100%   Physical Exam  Constitutional:  Chronically ill   HENT:  Head: Normocephalic.  Mouth/Throat: Oropharynx is clear and moist.  Eyes: Pupils are equal, round, and reactive to light. Conjunctivae and EOM are normal.  Neck: Normal range of motion. Neck supple.  Cardiovascular: Normal rate, regular rhythm and normal heart sounds.  Pulmonary/Chest: Effort normal and breath sounds normal. No stridor. No respiratory distress.  Abdominal: Soft. Bowel sounds are normal. She exhibits no distension. There is no tenderness.  Musculoskeletal:  Bilateral varicose veins on the legs. Mild diffuse tenderness throughout but no obvious bony deformity. 2+  pulses. Able to move bilateral hips and knees.   Neurological: She is alert.  Skin: Skin is warm.  Psychiatric: She has a normal mood and affect.  Nursing note and vitals reviewed.    ED Treatments / Results  Labs (all labs ordered are listed, but only abnormal results are displayed) Labs Reviewed - No data to display  EKG None  Radiology Dg Pelvis 1-2 Views  Result Date: 08/23/2017 CLINICAL DATA:  Bilateral hip pain for the past 2 months, radiating to the ankles. EXAM: PELVIS - 1-2 VIEW COMPARISON:  02/02/2017. FINDINGS: Stable right hip prosthesis. Progressive heterotopic bone formation laterally on the right with interval bridging between the greater trochanter and iliac bone. There is also progressive heterotopic bone formation medially at and inferior to the lesser trochanter. Old, healed right pubic bone fractures. Diffuse osteopenia. Unremarkable left hip. IMPRESSION: 1. No acute abnormality. 2. Progressive heterotopic bone formation on the right with interval bony bridging between the greater trochanter and iliac bone. Electronically Signed   By: Claudie Revering M.D.   On: 08/23/2017 09:16   Dg Tibia/fibula Left  Result Date: 08/23/2017 CLINICAL DATA:  Chronic left leg pain. EXAM: LEFT TIBIA AND FIBULA - 2 VIEW COMPARISON:  None. FINDINGS: There is no evidence of fracture or other focal bone lesions. Soft tissues are unremarkable. IMPRESSION: Normal left tibia and fibula. Electronically Signed   By: Marijo Conception, M.D.   On: 08/23/2017 09:19   Dg Tibia/fibula Right  Result Date: 08/23/2017 CLINICAL DATA:  Chronic right leg pain. EXAM: RIGHT TIBIA AND FIBULA - 2 VIEW COMPARISON:  None. FINDINGS: There is no evidence of fracture or other focal bone lesions. Soft tissues are unremarkable. IMPRESSION: Normal right tibia fibula. Electronically Signed   By: Marijo Conception, M.D.   On: 08/23/2017 09:20   Dg Femur Min 2 Views Left  Result Date: 08/23/2017 CLINICAL DATA:  Bilateral hip  and leg pain for the past 2 months. EXAM: LEFT FEMUR 2 VIEWS COMPARISON:  09/27/2016. FINDINGS: Diffuse osteopenia. Otherwise, normal appearing left femur. Small amount of arterial calcification. IMPRESSION: No acute abnormality. Electronically Signed   By: Claudie Revering M.D.   On: 08/23/2017 09:17  Dg Femur Min 2 Views Right  Result Date: 08/23/2017 CLINICAL DATA:  Bilateral hip pain. EXAM: RIGHT FEMUR 2 VIEWS COMPARISON:  Right hip radiographs 09/27/2016. FINDINGS: Right hip hemiarthroplasty is in place. The hip is located. Extensive heterotopic ossification is present about the hip. There is heterotopic ossification about the lesser trochanter as well as extensive ossification extending to the iliac bone. Remote healed right superior and inferior pubic rami fractures are present. IMPRESSION: 1. Right hip hemiarthroplasty.  The hip is located. 2. Extensive heterotopic ossification may restricted range of motion or cause pain. Heterotopic ossification extends from the greater trochanter to the iliac bone. 3. Distal femur is within normal limits. 4. Remote pelvic fractures. Electronically Signed   By: San Morelle M.D.   On: 08/23/2017 09:22    Procedures Procedures (including critical care time)  Medications Ordered in ED Medications  traMADol (ULTRAM) tablet 50 mg (50 mg Oral Given 08/23/17 0757)     Initial Impression / Assessment and Plan / ED Course  I have reviewed the triage vital signs and the nursing notes.  Pertinent labs & imaging results that were available during my care of the patient were reviewed by me and considered in my medical decision making (see chart for details).     Cynthia Wright is a 84 y.o. female here with bilateral leg pain with varicose veins. She is on eliquis. Has no obvious trauma. Will get xrays. Suspect pain either from arthritis or varicose veins or pain from post R hip replacement.   9:37 AM xrays showed arthritis. Ambulated well in the ED.  She had previous R hip replacement by Dr. Erlinda Hong, will refer back to [place]. She can continue tramadol for pain. Patient hypertensive in the ED but denies any chest pain or abdominal pain and has hx of HTN.   Final Clinical Impressions(s) / ED Diagnoses   Final diagnoses:  None    ED Discharge Orders    None       Drenda Freeze, MD 08/23/17 [id number]

## 2017-08-23 NOTE — ED Notes (Signed)
Bed: ID56 Expected date:  Expected time:  Means of arrival:  Comments: 84 yo non-traumatic hip pain

## 2017-08-23 NOTE — ED Notes (Signed)
[facility] call to transport patient back to [facility].

## 2017-08-23 NOTE — Discharge Instructions (Addendum)
Continue your tramadol and tylenol as prescribed by your doctor.   Your hip replacement was done by Dr. Erlinda Hong. Please call [facility] ortho for follow up appointment   Return to ER if you have worse hip pain, leg pain, unable to walk

## 2017-08-23 NOTE — ED Triage Notes (Signed)
Pt from [facility] on [address] c/o bilateral hip that is non-traumatic. Pt sts that pain radiates to bilateral feet. Pt has hx of dementia. Staff reports hip pain is chronic in nature. Pt in NAD

## 2017-08-25 DIAGNOSIS — E119 Type 2 diabetes mellitus without complications: Secondary | ICD-10-CM | POA: Diagnosis not present

## 2017-08-25 DIAGNOSIS — F419 Anxiety disorder, unspecified: Secondary | ICD-10-CM | POA: Diagnosis not present

## 2017-08-25 DIAGNOSIS — I5032 Chronic diastolic (congestive) heart failure: Secondary | ICD-10-CM | POA: Diagnosis not present

## 2017-08-25 DIAGNOSIS — F331 Major depressive disorder, recurrent, moderate: Secondary | ICD-10-CM | POA: Diagnosis not present

## 2017-08-25 DIAGNOSIS — I110 Hypertensive heart disease with heart failure: Secondary | ICD-10-CM | POA: Diagnosis not present

## 2017-08-25 DIAGNOSIS — F0390 Unspecified dementia without behavioral disturbance: Secondary | ICD-10-CM | POA: Diagnosis not present

## 2017-08-25 DIAGNOSIS — R2681 Unsteadiness on feet: Secondary | ICD-10-CM | POA: Diagnosis not present

## 2017-08-25 DIAGNOSIS — R531 Weakness: Secondary | ICD-10-CM | POA: Diagnosis not present

## 2017-08-27 DIAGNOSIS — E119 Type 2 diabetes mellitus without complications: Secondary | ICD-10-CM | POA: Diagnosis not present

## 2017-08-27 DIAGNOSIS — F331 Major depressive disorder, recurrent, moderate: Secondary | ICD-10-CM | POA: Diagnosis not present

## 2017-08-27 DIAGNOSIS — F0280 Dementia in other diseases classified elsewhere without behavioral disturbance: Secondary | ICD-10-CM | POA: Diagnosis not present

## 2017-08-27 DIAGNOSIS — R2681 Unsteadiness on feet: Secondary | ICD-10-CM | POA: Diagnosis not present

## 2017-08-27 DIAGNOSIS — G301 Alzheimer's disease with late onset: Secondary | ICD-10-CM | POA: Diagnosis not present

## 2017-08-27 DIAGNOSIS — I5032 Chronic diastolic (congestive) heart failure: Secondary | ICD-10-CM | POA: Diagnosis not present

## 2017-08-27 DIAGNOSIS — F5105 Insomnia due to other mental disorder: Secondary | ICD-10-CM | POA: Diagnosis not present

## 2017-08-27 DIAGNOSIS — R531 Weakness: Secondary | ICD-10-CM | POA: Diagnosis not present

## 2017-08-27 DIAGNOSIS — F0390 Unspecified dementia without behavioral disturbance: Secondary | ICD-10-CM | POA: Diagnosis not present

## 2017-08-27 DIAGNOSIS — I110 Hypertensive heart disease with heart failure: Secondary | ICD-10-CM | POA: Diagnosis not present

## 2017-08-28 ENCOUNTER — Ambulatory Visit (INDEPENDENT_AMBULATORY_CARE_PROVIDER_SITE_OTHER): Payer: Medicare Other | Admitting: Orthopaedic Surgery

## 2017-08-28 DIAGNOSIS — R531 Weakness: Secondary | ICD-10-CM | POA: Diagnosis not present

## 2017-08-28 DIAGNOSIS — I5032 Chronic diastolic (congestive) heart failure: Secondary | ICD-10-CM | POA: Diagnosis not present

## 2017-08-28 DIAGNOSIS — F0390 Unspecified dementia without behavioral disturbance: Secondary | ICD-10-CM | POA: Diagnosis not present

## 2017-08-28 DIAGNOSIS — I110 Hypertensive heart disease with heart failure: Secondary | ICD-10-CM | POA: Diagnosis not present

## 2017-08-28 DIAGNOSIS — R2681 Unsteadiness on feet: Secondary | ICD-10-CM | POA: Diagnosis not present

## 2017-08-28 DIAGNOSIS — E119 Type 2 diabetes mellitus without complications: Secondary | ICD-10-CM | POA: Diagnosis not present

## 2017-08-31 DIAGNOSIS — R2681 Unsteadiness on feet: Secondary | ICD-10-CM | POA: Diagnosis not present

## 2017-08-31 DIAGNOSIS — R531 Weakness: Secondary | ICD-10-CM | POA: Diagnosis not present

## 2017-08-31 DIAGNOSIS — I110 Hypertensive heart disease with heart failure: Secondary | ICD-10-CM | POA: Diagnosis not present

## 2017-08-31 DIAGNOSIS — I5032 Chronic diastolic (congestive) heart failure: Secondary | ICD-10-CM | POA: Diagnosis not present

## 2017-08-31 DIAGNOSIS — F0390 Unspecified dementia without behavioral disturbance: Secondary | ICD-10-CM | POA: Diagnosis not present

## 2017-08-31 DIAGNOSIS — E119 Type 2 diabetes mellitus without complications: Secondary | ICD-10-CM | POA: Diagnosis not present

## 2017-09-01 DIAGNOSIS — E119 Type 2 diabetes mellitus without complications: Secondary | ICD-10-CM | POA: Diagnosis not present

## 2017-09-01 DIAGNOSIS — I5032 Chronic diastolic (congestive) heart failure: Secondary | ICD-10-CM | POA: Diagnosis not present

## 2017-09-01 DIAGNOSIS — I110 Hypertensive heart disease with heart failure: Secondary | ICD-10-CM | POA: Diagnosis not present

## 2017-09-01 DIAGNOSIS — F0390 Unspecified dementia without behavioral disturbance: Secondary | ICD-10-CM | POA: Diagnosis not present

## 2017-09-01 DIAGNOSIS — R531 Weakness: Secondary | ICD-10-CM | POA: Diagnosis not present

## 2017-09-01 DIAGNOSIS — R2681 Unsteadiness on feet: Secondary | ICD-10-CM | POA: Diagnosis not present

## 2017-09-02 DIAGNOSIS — R2681 Unsteadiness on feet: Secondary | ICD-10-CM | POA: Diagnosis not present

## 2017-09-02 DIAGNOSIS — F0390 Unspecified dementia without behavioral disturbance: Secondary | ICD-10-CM | POA: Diagnosis not present

## 2017-09-02 DIAGNOSIS — E119 Type 2 diabetes mellitus without complications: Secondary | ICD-10-CM | POA: Diagnosis not present

## 2017-09-02 DIAGNOSIS — I110 Hypertensive heart disease with heart failure: Secondary | ICD-10-CM | POA: Diagnosis not present

## 2017-09-02 DIAGNOSIS — R531 Weakness: Secondary | ICD-10-CM | POA: Diagnosis not present

## 2017-09-02 DIAGNOSIS — I5032 Chronic diastolic (congestive) heart failure: Secondary | ICD-10-CM | POA: Diagnosis not present

## 2017-09-07 DIAGNOSIS — E1159 Type 2 diabetes mellitus with other circulatory complications: Secondary | ICD-10-CM | POA: Diagnosis not present

## 2017-09-07 DIAGNOSIS — E119 Type 2 diabetes mellitus without complications: Secondary | ICD-10-CM | POA: Diagnosis not present

## 2017-09-07 DIAGNOSIS — I739 Peripheral vascular disease, unspecified: Secondary | ICD-10-CM | POA: Diagnosis not present

## 2017-09-07 DIAGNOSIS — F0390 Unspecified dementia without behavioral disturbance: Secondary | ICD-10-CM | POA: Diagnosis not present

## 2017-09-07 DIAGNOSIS — I5032 Chronic diastolic (congestive) heart failure: Secondary | ICD-10-CM | POA: Diagnosis not present

## 2017-09-07 DIAGNOSIS — R531 Weakness: Secondary | ICD-10-CM | POA: Diagnosis not present

## 2017-09-07 DIAGNOSIS — R2681 Unsteadiness on feet: Secondary | ICD-10-CM | POA: Diagnosis not present

## 2017-09-07 DIAGNOSIS — I110 Hypertensive heart disease with heart failure: Secondary | ICD-10-CM | POA: Diagnosis not present

## 2017-09-08 DIAGNOSIS — E119 Type 2 diabetes mellitus without complications: Secondary | ICD-10-CM | POA: Diagnosis not present

## 2017-09-08 DIAGNOSIS — I5032 Chronic diastolic (congestive) heart failure: Secondary | ICD-10-CM | POA: Diagnosis not present

## 2017-09-08 DIAGNOSIS — R2681 Unsteadiness on feet: Secondary | ICD-10-CM | POA: Diagnosis not present

## 2017-09-08 DIAGNOSIS — F331 Major depressive disorder, recurrent, moderate: Secondary | ICD-10-CM | POA: Diagnosis not present

## 2017-09-08 DIAGNOSIS — I110 Hypertensive heart disease with heart failure: Secondary | ICD-10-CM | POA: Diagnosis not present

## 2017-09-08 DIAGNOSIS — F0390 Unspecified dementia without behavioral disturbance: Secondary | ICD-10-CM | POA: Diagnosis not present

## 2017-09-08 DIAGNOSIS — F419 Anxiety disorder, unspecified: Secondary | ICD-10-CM | POA: Diagnosis not present

## 2017-09-08 DIAGNOSIS — R531 Weakness: Secondary | ICD-10-CM | POA: Diagnosis not present

## 2017-09-09 DIAGNOSIS — R531 Weakness: Secondary | ICD-10-CM | POA: Diagnosis not present

## 2017-09-09 DIAGNOSIS — I110 Hypertensive heart disease with heart failure: Secondary | ICD-10-CM | POA: Diagnosis not present

## 2017-09-09 DIAGNOSIS — F0390 Unspecified dementia without behavioral disturbance: Secondary | ICD-10-CM | POA: Diagnosis not present

## 2017-09-09 DIAGNOSIS — E119 Type 2 diabetes mellitus without complications: Secondary | ICD-10-CM | POA: Diagnosis not present

## 2017-09-09 DIAGNOSIS — I5032 Chronic diastolic (congestive) heart failure: Secondary | ICD-10-CM | POA: Diagnosis not present

## 2017-09-09 DIAGNOSIS — R2681 Unsteadiness on feet: Secondary | ICD-10-CM | POA: Diagnosis not present

## 2017-09-11 DIAGNOSIS — F5105 Insomnia due to other mental disorder: Secondary | ICD-10-CM | POA: Diagnosis not present

## 2017-09-11 DIAGNOSIS — F331 Major depressive disorder, recurrent, moderate: Secondary | ICD-10-CM | POA: Diagnosis not present

## 2017-09-11 DIAGNOSIS — F0280 Dementia in other diseases classified elsewhere without behavioral disturbance: Secondary | ICD-10-CM | POA: Diagnosis not present

## 2017-09-11 DIAGNOSIS — G301 Alzheimer's disease with late onset: Secondary | ICD-10-CM | POA: Diagnosis not present

## 2017-09-18 DIAGNOSIS — E46 Unspecified protein-calorie malnutrition: Secondary | ICD-10-CM | POA: Diagnosis not present

## 2017-09-18 DIAGNOSIS — R296 Repeated falls: Secondary | ICD-10-CM | POA: Diagnosis not present

## 2017-09-18 DIAGNOSIS — E785 Hyperlipidemia, unspecified: Secondary | ICD-10-CM | POA: Diagnosis not present

## 2017-09-18 DIAGNOSIS — E119 Type 2 diabetes mellitus without complications: Secondary | ICD-10-CM | POA: Diagnosis not present

## 2017-09-18 DIAGNOSIS — R269 Unspecified abnormalities of gait and mobility: Secondary | ICD-10-CM | POA: Diagnosis not present

## 2017-09-18 DIAGNOSIS — K5900 Constipation, unspecified: Secondary | ICD-10-CM | POA: Diagnosis not present

## 2017-09-18 DIAGNOSIS — R32 Unspecified urinary incontinence: Secondary | ICD-10-CM | POA: Diagnosis not present

## 2017-09-18 DIAGNOSIS — I482 Chronic atrial fibrillation: Secondary | ICD-10-CM | POA: Diagnosis not present

## 2017-09-18 DIAGNOSIS — M25551 Pain in right hip: Secondary | ICD-10-CM | POA: Diagnosis not present

## 2017-09-18 DIAGNOSIS — I10 Essential (primary) hypertension: Secondary | ICD-10-CM | POA: Diagnosis not present

## 2017-09-18 DIAGNOSIS — I5030 Unspecified diastolic (congestive) heart failure: Secondary | ICD-10-CM | POA: Diagnosis not present

## 2017-09-21 DIAGNOSIS — F5105 Insomnia due to other mental disorder: Secondary | ICD-10-CM | POA: Diagnosis not present

## 2017-09-21 DIAGNOSIS — G301 Alzheimer's disease with late onset: Secondary | ICD-10-CM | POA: Diagnosis not present

## 2017-09-21 DIAGNOSIS — F0280 Dementia in other diseases classified elsewhere without behavioral disturbance: Secondary | ICD-10-CM | POA: Diagnosis not present

## 2017-09-21 DIAGNOSIS — F331 Major depressive disorder, recurrent, moderate: Secondary | ICD-10-CM | POA: Diagnosis not present

## 2017-09-22 DIAGNOSIS — F331 Major depressive disorder, recurrent, moderate: Secondary | ICD-10-CM | POA: Diagnosis not present

## 2017-09-22 DIAGNOSIS — F419 Anxiety disorder, unspecified: Secondary | ICD-10-CM | POA: Diagnosis not present

## 2017-10-01 DIAGNOSIS — M79669 Pain in unspecified lower leg: Secondary | ICD-10-CM | POA: Diagnosis not present

## 2017-10-01 DIAGNOSIS — G894 Chronic pain syndrome: Secondary | ICD-10-CM | POA: Diagnosis not present

## 2017-10-05 DIAGNOSIS — K5900 Constipation, unspecified: Secondary | ICD-10-CM | POA: Diagnosis not present

## 2017-10-05 DIAGNOSIS — M25551 Pain in right hip: Secondary | ICD-10-CM | POA: Diagnosis not present

## 2017-10-05 DIAGNOSIS — R32 Unspecified urinary incontinence: Secondary | ICD-10-CM | POA: Diagnosis not present

## 2017-10-05 DIAGNOSIS — E46 Unspecified protein-calorie malnutrition: Secondary | ICD-10-CM | POA: Diagnosis not present

## 2017-10-05 DIAGNOSIS — E119 Type 2 diabetes mellitus without complications: Secondary | ICD-10-CM | POA: Diagnosis not present

## 2017-10-05 DIAGNOSIS — R296 Repeated falls: Secondary | ICD-10-CM | POA: Diagnosis not present

## 2017-10-05 DIAGNOSIS — I482 Chronic atrial fibrillation: Secondary | ICD-10-CM | POA: Diagnosis not present

## 2017-10-05 DIAGNOSIS — E785 Hyperlipidemia, unspecified: Secondary | ICD-10-CM | POA: Diagnosis not present

## 2017-10-05 DIAGNOSIS — I5030 Unspecified diastolic (congestive) heart failure: Secondary | ICD-10-CM | POA: Diagnosis not present

## 2017-10-05 DIAGNOSIS — R269 Unspecified abnormalities of gait and mobility: Secondary | ICD-10-CM | POA: Diagnosis not present

## 2017-10-05 DIAGNOSIS — I10 Essential (primary) hypertension: Secondary | ICD-10-CM | POA: Diagnosis not present

## 2017-10-06 ENCOUNTER — Encounter (HOSPITAL_COMMUNITY): Payer: Self-pay

## 2017-10-06 ENCOUNTER — Emergency Department (HOSPITAL_COMMUNITY)
Admission: EM | Admit: 2017-10-06 | Discharge: 2017-10-06 | Disposition: A | Discharge status: Home / Self Care | Payer: Medicare Other | Attending: Emergency Medicine | Admitting: Emergency Medicine

## 2017-10-06 ENCOUNTER — Emergency Department (HOSPITAL_COMMUNITY): Payer: Medicare Other

## 2017-10-06 DIAGNOSIS — Y998 Other external cause status: Secondary | ICD-10-CM | POA: Insufficient documentation

## 2017-10-06 DIAGNOSIS — R9431 Abnormal electrocardiogram [ECG] [EKG]: Secondary | ICD-10-CM | POA: Diagnosis not present

## 2017-10-06 DIAGNOSIS — R609 Edema, unspecified: Secondary | ICD-10-CM | POA: Diagnosis not present

## 2017-10-06 DIAGNOSIS — E119 Type 2 diabetes mellitus without complications: Secondary | ICD-10-CM | POA: Diagnosis not present

## 2017-10-06 DIAGNOSIS — W19XXXA Unspecified fall, initial encounter: Secondary | ICD-10-CM | POA: Diagnosis not present

## 2017-10-06 DIAGNOSIS — Z7984 Long term (current) use of oral hypoglycemic drugs: Secondary | ICD-10-CM | POA: Insufficient documentation

## 2017-10-06 DIAGNOSIS — Z7901 Long term (current) use of anticoagulants: Secondary | ICD-10-CM | POA: Diagnosis not present

## 2017-10-06 DIAGNOSIS — W0110XA Fall on same level from slipping, tripping and stumbling with subsequent striking against unspecified object, initial encounter: Secondary | ICD-10-CM | POA: Diagnosis not present

## 2017-10-06 DIAGNOSIS — F419 Anxiety disorder, unspecified: Secondary | ICD-10-CM | POA: Diagnosis not present

## 2017-10-06 DIAGNOSIS — S0990XA Unspecified injury of head, initial encounter: Secondary | ICD-10-CM | POA: Diagnosis not present

## 2017-10-06 DIAGNOSIS — Y92129 Unspecified place in nursing home as the place of occurrence of the external cause: Secondary | ICD-10-CM | POA: Insufficient documentation

## 2017-10-06 DIAGNOSIS — I959 Hypotension, unspecified: Secondary | ICD-10-CM | POA: Diagnosis not present

## 2017-10-06 DIAGNOSIS — S0181XA Laceration without foreign body of other part of head, initial encounter: Secondary | ICD-10-CM | POA: Diagnosis not present

## 2017-10-06 DIAGNOSIS — Z23 Encounter for immunization: Secondary | ICD-10-CM | POA: Insufficient documentation

## 2017-10-06 DIAGNOSIS — S0081XA Abrasion of other part of head, initial encounter: Secondary | ICD-10-CM

## 2017-10-06 DIAGNOSIS — S199XXA Unspecified injury of neck, initial encounter: Secondary | ICD-10-CM | POA: Diagnosis not present

## 2017-10-06 DIAGNOSIS — S0993XA Unspecified injury of face, initial encounter: Secondary | ICD-10-CM | POA: Diagnosis present

## 2017-10-06 DIAGNOSIS — Y9389 Activity, other specified: Secondary | ICD-10-CM | POA: Insufficient documentation

## 2017-10-06 DIAGNOSIS — I5032 Chronic diastolic (congestive) heart failure: Secondary | ICD-10-CM | POA: Insufficient documentation

## 2017-10-06 DIAGNOSIS — Z9049 Acquired absence of other specified parts of digestive tract: Secondary | ICD-10-CM | POA: Diagnosis not present

## 2017-10-06 DIAGNOSIS — S00511A Abrasion of lip, initial encounter: Secondary | ICD-10-CM | POA: Insufficient documentation

## 2017-10-06 DIAGNOSIS — Z743 Need for continuous supervision: Secondary | ICD-10-CM | POA: Diagnosis not present

## 2017-10-06 DIAGNOSIS — F0390 Unspecified dementia without behavioral disturbance: Secondary | ICD-10-CM | POA: Diagnosis not present

## 2017-10-06 DIAGNOSIS — I110 Hypertensive heart disease with heart failure: Secondary | ICD-10-CM | POA: Diagnosis not present

## 2017-10-06 DIAGNOSIS — F331 Major depressive disorder, recurrent, moderate: Secondary | ICD-10-CM | POA: Diagnosis not present

## 2017-10-06 DIAGNOSIS — Z87891 Personal history of nicotine dependence: Secondary | ICD-10-CM | POA: Insufficient documentation

## 2017-10-06 DIAGNOSIS — Z79899 Other long term (current) drug therapy: Secondary | ICD-10-CM | POA: Insufficient documentation

## 2017-10-06 DIAGNOSIS — I10 Essential (primary) hypertension: Secondary | ICD-10-CM | POA: Diagnosis not present

## 2017-10-06 DIAGNOSIS — R296 Repeated falls: Secondary | ICD-10-CM | POA: Diagnosis not present

## 2017-10-06 DIAGNOSIS — R279 Unspecified lack of coordination: Secondary | ICD-10-CM | POA: Diagnosis not present

## 2017-10-06 LAB — BASIC METABOLIC PANEL
ANION GAP: 14 (ref 5–15)
BUN: 10 mg/dL (ref 8–23)
CO2: 26 mmol/L (ref 22–32)
Calcium: 8.8 mg/dL — ABNORMAL LOW (ref 8.9–10.3)
Chloride: 101 mmol/L (ref 98–111)
Creatinine, Ser: 1 mg/dL (ref 0.44–1.00)
GFR calc non Af Amer: 48 mL/min — ABNORMAL LOW (ref >60)
GFR, EST AFRICAN AMERICAN: 56 mL/min — AB (ref >60)
Glucose, Bld: 162 mg/dL — ABNORMAL HIGH (ref 70–99)
POTASSIUM: 3.6 mmol/L (ref 3.5–5.1)
Sodium: 141 mmol/L (ref 135–145)

## 2017-10-06 LAB — CBC WITH DIFFERENTIAL/PLATELET
ABS IMMATURE GRANULOCYTES: 0 10*3/uL (ref 0.0–0.1)
BASOS PCT: 0 %
Basophils Absolute: 0 10*3/uL (ref 0.0–0.1)
EOS ABS: 0 10*3/uL (ref 0.0–0.7)
Eosinophils Relative: 0 %
HCT: 40.1 % (ref 36.0–46.0)
Hemoglobin: 13 g/dL (ref 12.0–15.0)
IMMATURE GRANULOCYTES: 0 %
LYMPHS ABS: 1.1 10*3/uL (ref 0.7–4.0)
Lymphocytes Relative: 19 %
MCH: 33.5 pg (ref 26.0–34.0)
MCHC: 32.4 g/dL (ref 30.0–36.0)
MCV: 103.4 fL — AB (ref 78.0–100.0)
Monocytes Absolute: 0.5 10*3/uL (ref 0.1–1.0)
Monocytes Relative: 8 %
NEUTROS ABS: 4.1 10*3/uL (ref 1.7–7.7)
NEUTROS PCT: 73 %
Platelets: 181 10*3/uL (ref 150–400)
RBC: 3.88 MIL/uL (ref 3.87–5.11)
RDW: 12.7 % (ref 11.5–15.5)
WBC: 5.7 10*3/uL (ref 4.0–10.5)

## 2017-10-06 LAB — PROTIME-INR
INR: 1.12
PROTHROMBIN TIME: 14.4 s (ref 11.4–15.2)

## 2017-10-06 LAB — TROPONIN I

## 2017-10-06 MED ORDER — LIDOCAINE HCL 2 % IJ SOLN: 10.0000 mL | Freq: Once | INTRAMUSCULAR | Status: DC

## 2017-10-06 MED ORDER — BUPIVACAINE-EPINEPHRINE (PF) 0.25% -1:200000 IJ SOLN
1.7000 mL | Freq: Once | INTRAMUSCULAR | Status: DC
  Filled 2017-10-06: qty 30

## 2017-10-06 MED ORDER — BACITRACIN ZINC 500 UNIT/GM EX OINT: 1.0000 "application " | TOPICAL_OINTMENT | Freq: Two times a day (BID) | CUTANEOUS | 0 refills | Status: AC

## 2017-10-06 MED ORDER — TETANUS-DIPHTH-ACELL PERTUSSIS 5-2.5-18.5 LF-MCG/0.5 IM SUSP
0.5000 mL | Freq: Once | INTRAMUSCULAR | Status: AC
  Administered 2017-10-06: 0.5 mL via INTRAMUSCULAR
  Filled 2017-10-06: qty 0.5

## 2017-10-06 NOTE — ED Notes (Signed)
Patient transported to CT 

## 2017-10-06 NOTE — Discharge Instructions (Addendum)
Return to the Er immediately if you develop new or worsening headache, confusion, or other symptoms

## 2017-10-06 NOTE — ED Notes (Signed)
Pt back from [place]

## 2017-10-06 NOTE — ED Notes (Signed)
Patient transported to X-ray 

## 2017-10-06 NOTE — ED Provider Notes (Signed)
[facility] EMERGENCY DEPARTMENT Provider Note   CSN: [id number] Arrival date & time: 10/06/17  1443     History   Chief Complaint Chief Complaint  Patient presents with  . Fall    HPI Cynthia Wright is a 84 y.o. female.  HPI   84 year old female with past medical history as below including dementia, A. fib on blood thinners, here with fall.  Patient initially reports that she cannot remember what happened.  However, after further questioning, the patient does state that she remembers taking a shower, going into the other room, then tripping.  She does not remember what happened after that.  Per family report via telephone, the patient has a history of severe short-term memory dysfunction due to her dementia.  Currently, she denies any pain or complaints.  She does not know why she is here and is requesting to leave.  She does not complain of any headache or focal numbness or weakness.  She does not complain of any chest pain or shortness of breath.  Remainder of history limited due to dementia.  Past Medical History:  Diagnosis Date  . Atrial fibrillation ([place])    s/p DCCV 07/2012  . Benign diastolic hypertension   . Chronic anticoagulation   . Chronic diastolic CHF (congestive heart failure) (H[facility])    EF now 65% by echo 10/2012 with grade II diasotlid dysfunction  . Diabetes mellitus without complication ([place])   . Dilated cardiomyopathy secondary to tachycardia ([place]) 07/2012   2D echo 10/2012 showed EF 65% with grade II diastolic dysfunction  . Elevated cholesterol   . Hyperlipidemia   . Hypertension   . Liver cyst   . Memory loss     Patient Active Problem List   Diagnosis Date Noted  . Elevated LFTs 12/22/2016  . HCAP (healthcare-associated pneumonia)   . Dementia with behavioral disturbance   . Hypokalemia   . Postoperative anemia   . Hyperlipidemia   . E. coli UTI   . Closed displaced fracture of right femoral neck ([place]) 09/28/2016  . Fracture  of left leg 09/27/2016  . Fall 07/12/2016  . Leukocytosis 07/12/2016  . Type 2 diabetes mellitus with hyperlipidemia ([place]) 07/12/2016  . Acetabular fracture ([place]) 07/11/2016  . Dilated cardiomyopathy secondary to tachycardia ([place]) 12/14/2013  . Syncope 06/14/2013  . Bradycardia 05/11/2013  . Fatigue 05/11/2013  . Chronic diastolic CHF (congestive heart failure) ([place]) 01/06/2013  . Mitral regurgitation 08/06/2012  . PAF (paroxysmal atrial fibrillation) ([place]) 08/02/2012  . Benign essential HTN 08/02/2012  . Pure hypercholesterolemia 08/02/2012  . Liver cyst 08/02/2012    Past Surgical History:  Procedure Laterality Date  . ANTERIOR APPROACH HEMI HIP ARTHROPLASTY Right 09/28/2016   Procedure: ANTERIOR APPROACH HEMI HIP ARTHROPLASTY;  Surgeon: Leandrew Koyanagi, MD;  Location: [facility];  Service: Orthopedics;  Laterality: Right;  . APPENDECTOMY    . BACK SURGERY     herniated disc  . CARDIOVERSION N/A 08/09/2012   Procedure: CARDIOVERSION;  Surgeon: Sueanne Margarita, MD;  Location: [facility];  Service: Cardiovascular;  Laterality: N/A;  . CARDIOVERSION N/A 09/28/2014   Procedure: CARDIOVERSION;  Surgeon: Sueanne Margarita, MD;  Location: [facility];  Service: Cardiovascular;  Laterality: N/A;  . CHOLECYSTECTOMY    . TEE WITHOUT CARDIOVERSION N/A 08/09/2012   Procedure: TRANSESOPHAGEAL ECHOCARDIOGRAM (TEE);  Surgeon: Sueanne Margarita, MD;  Location[facility] ENDOSCOPY;  Service: Cardiovascular;  Laterality: N/A;  talk to angie in [place].  . TONSILLECTOMY  OB History   None      Home Medications    Prior to Admission medications   Medication Sig Start Date End Date Taking? Authorizing Provider  acetaminophen (TYLENOL) 500 MG tablet Take 500 mg by mouth 2 (two) times daily. Take 500mg  by mouth two times a day for pain    [provider]  amiodarone (PACERONE) 200 MG tablet Take 1 tablet (200 mg total) by mouth daily. 10/20/16   Rosita Fire, MD  bacitracin ointment Apply 1  application topically 2 (two) times daily. For 10 days or until healed 10/06/17   Duffy Bruce, MD  buPROPion [facility] SR) 150 MG 12 hr tablet Take 150 mg 2 (two) times daily by mouth.    [provider]  busPIRone (BUSPAR) 7.5 MG tablet Take 7.5 mg by mouth 2 (two) times daily. 08/14/17   [provider]  diltiazem (CARDIZEM CD) 240 MG 24 hr capsule Take 1 capsule (240 mg total) by mouth daily. 12/16/16 08/23/17  Sueanne Margarita, MD  docusate sodium (COLACE) 100 MG capsule Take 200 mg by mouth at bedtime.     [provider]  donepezil (ARICEPT) 10 MG tablet Take 10 mg by mouth at bedtime.    [provider]  ELIQUIS 2.5 MG TABS tablet TAKE 1 TABLET (2.5 MG TOTAL) BY MOUTH 2 (TWO) TIMES DAILY. 04/21/16   Sueanne Margarita, MD  furosemide (LASIX) 40 MG tablet Take 1 tablet (40 mg total) by mouth daily. 12/27/16   Lavina Hamman, MD  ibuprofen (ADVIL,MOTRIN) 800 MG tablet Take 1 tablet (800 mg total) by mouth every 8 (eight) hours as needed. 04/12/17   Lawyer, Harrell Gave, PA-C  iron polysaccharides (NIFEREX) 150 MG capsule Take 1 capsule (150 mg total) by mouth daily. 09/30/16   Barton Dubois, MD  KLOR-CON 20 MEQ packet Take 20 mEq by mouth daily. With food 07/31/17   [provider]  lisinopril (PRINIVIL,ZESTRIL) 10 MG tablet Take 20 mg by mouth daily. 08/17/17   [provider]  Melatonin 10 MG TABS Take 10 mg by mouth at bedtime.     [provider]  memantine (NAMENDA) 5 MG tablet Take 5 mg by mouth daily.    [provider]  neomycin-bacitracin-polymyxin (NEOSPORIN) [phone] ointment Apply 1 application daily topically. Left leg after dressing change.    [provider]  polyethylene glycol (MIRALAX / GLYCOLAX) packet Take 17 g by mouth daily.    [provider]  potassium chloride SA (KLOR-CON M20) 20 MEQ tablet Take 2 tablets (40 mEq total) by mouth every other day. Patient taking differently: Take 20 mEq  daily by mouth.  05/23/16   Sueanne Margarita, MD  rosuvastatin (CRESTOR) 5 MG tablet Take 5 mg by mouth at bedtime.     [provider]  sertraline (ZOLOFT) 25 MG tablet Take 25 mg by mouth daily. 08/14/17   [provider]  sitaGLIPtin (JANUVIA) 100 MG tablet Take 100 mg by mouth daily.    [provider]  traMADol (ULTRAM) 50 MG tablet Take 1 tablet (50 mg total) by mouth every 6 (six) hours as needed for severe pain. Patient not taking: Reported on 08/23/2017 04/12/17   Dalia Heading, PA-C  traMADol (ULTRAM) 50 MG tablet Take 50 mg by mouth 2 (two) times daily. Take 50mg  by mouth twice a day for pain    [provider]    Family History Family History  Problem Relation Age of Onset  .  Heart disease Mother   . CAD Mother   . Heart disease Father   . CAD Father   . Pancreatic cancer Brother     Social History Social History   Tobacco Use  . Smoking status: Former Research scientist (life sciences)  . Smokeless tobacco: Never Used  Substance Use Topics  . Alcohol use: Yes    Alcohol/week: 0.6 - 1.2 oz    Types: 1 - 2 Glasses of wine per week  . Drug use: No     Allergies   Codeine; Lortab [hydrocodone-acetaminophen]; and Hydrocodone   Review of Systems Review of Systems  Unable to perform ROS: Dementia     Physical Exam Updated Vital Signs BP (!) 180/63 (BP Location: Right Arm)   Pulse (!) 57   Temp 98.4 F (36.9 C) (Oral)   Resp 18   SpO2 97%   Physical Exam  Constitutional: She appears well-developed and well-nourished. No distress.  HENT:  Head: Normocephalic and atraumatic.  Superficial abrasion to the right lip.  There is a small, less than 5 mm superficial laceration along the mucosal surface of the right buccal surface of the lip.  No extension to the external surface or involvement of the vermilion border.  Small contusion to the right periorbital area without orbital involvement.  Eyes: Conjunctivae are normal.  Neck: Neck supple.    Cardiovascular: Normal rate, regular rhythm and normal heart sounds. Exam reveals no friction rub.  No murmur heard. Pulmonary/Chest: Effort normal and breath sounds normal. No respiratory distress. She has no wheezes. She has no rales.  Abdominal: She exhibits no distension.  Musculoskeletal: She exhibits no edema.  Neurological: She is alert. She exhibits normal muscle tone.  Oriented to person, place, and year.  Does not know date.  Short-term memory impaired.  Moves all extremities with 5 out of 5 strength.  Cranial nerves intact.  Normal sensation light touch.  Skin: Skin is warm. Capillary refill takes less than 2 seconds.  Psychiatric: She has a normal mood and affect.  Nursing note and vitals reviewed.    ED Treatments / Results  Labs (all labs ordered are listed, but only abnormal results are displayed) Labs Reviewed  CBC WITH DIFFERENTIAL/PLATELET - Abnormal; Notable for the following components:      Result Value   MCV 103.4 (*)    All other components within normal limits  BASIC METABOLIC PANEL - Abnormal; Notable for the following components:   Glucose, Bld 162 (*)    Calcium 8.8 (*)    GFR calc non Af Amer 48 (*)    GFR calc Af Amer 56 (*)    All other components within normal limits  TROPONIN I  PROTIME-INR  URINALYSIS, ROUTINE W REFLEX MICROSCOPIC    EKG EKG Interpretation  Date/Time:  Tuesday October 06 2017 14:58:39 EDT Ventricular Rate:  60 PR Interval:  192 QRS Duration: 102 QT Interval:  430 QTC Calculation: 430 R Axis:   79 Text Interpretation:  Normal sinus rhythm Minimal voltage criteria for LVH, may be normal variant Anteroseptal infarct , age undetermined T wave abnormality, consider lateral ischemia Abnormal ECG Since last EKG, TWI more evident in inferolateral leads Confirmed by Duffy Bruce [phone]) on 10/06/2017 3:03:24 PM   Radiology Dg Chest 2 View  Result Date: 10/06/2017 CLINICAL DATA:  The patient has suffered 2 recent falls. EXAM:  CHEST - 2 VIEW COMPARISON:  PA and lateral chest 12/28/2012.  CT chest 12/24/2016. FINDINGS: The lungs are clear. The chest is hyperexpanded with  enlargement of the retrosternal airspace and flattening of the hemidiaphragms. Heart size is normal. Aortic atherosclerosis noted. IMPRESSION: No acute disease. The lungs appear emphysematous. Atherosclerosis. Electronically Signed   By: Inge Rise M.D.   On: 10/06/2017 18:35   Ct Head Wo Contrast  Result Date: 10/06/2017 CLINICAL DATA:  Unwitnessed fall.  Laceration EXAM: CT HEAD WITHOUT CONTRAST CT MAXILLOFACIAL WITHOUT CONTRAST CT CERVICAL SPINE WITHOUT CONTRAST TECHNIQUE: Multidetector CT imaging of the head, cervical spine, and maxillofacial structures were performed using the standard protocol without intravenous contrast. Multiplanar CT image reconstructions of the cervical spine and maxillofacial structures were also generated. COMPARISON:  None. FINDINGS: CT HEAD FINDINGS Brain: No intracranial hemorrhage. No parenchymal contusion. No midline shift or mass effect. Basilar cisterns are patent. No skull base fracture. No fluid in the paranasal sinuses or mastoid air cells. Orbits are normal. There are periventricular and subcortical white matter hypodensities. Generalized cortical atrophy. Vascular: No hyperdense vessel or unexpected calcification. Skull: Normal. Negative for fracture or focal lesion. Sinuses/Orbits: Paranasal sinuses and mastoid air cells are clear. Orbits are clear. Other: None. CT MAXILLOFACIAL FINDINGS Osseous: Zygomatic arches are intact. Orbital walls are intact. No fracture of the maxillary sinus walls. Pterygoid plates intact. No nasal bone fracture. Mandibular condyles are located. No mandibular fracture. Orbits: Globes are normal. No proptosis. Intraconal contents are clear. Sinuses: No fluid in the paranasal sinuses. Soft tissues: Unremarkable CT CERVICAL SPINE FINDINGS Alignment: Normal alignment of the cervical vertebral  bodies. Skull base and vertebrae: Normal craniocervical junction. No loss of vertebral body height or disc height. Normal facet articulation. No evidence of fracture. Soft tissues and spinal canal: No prevertebral soft tissue swelling. No perispinal or epidural hematoma. Disc levels:  Unremarkable Upper chest: Clear Other: Nodule enlargement of the RIGHT lobe of thyroid gland 3.5 by 2.3 cm. Nodule enlargement of the LEFT gland lesser degree. IMPRESSION: 1. No intracranial trauma. 2. Atrophy and white matter microvascular disease. 3. No facial bone fracture. 4. No cervical spine fracture. 5. Enlargement of the thyroid gland is likely benign goiter. Electronically Signed   By: Suzy Bouchard M.D.   On: 10/06/2017 17:01   Ct Cervical Spine Wo Contrast  Result Date: 10/06/2017 CLINICAL DATA:  Unwitnessed fall.  Laceration EXAM: CT HEAD WITHOUT CONTRAST CT MAXILLOFACIAL WITHOUT CONTRAST CT CERVICAL SPINE WITHOUT CONTRAST TECHNIQUE: Multidetector CT imaging of the head, cervical spine, and maxillofacial structures were performed using the standard protocol without intravenous contrast. Multiplanar CT image reconstructions of the cervical spine and maxillofacial structures were also generated. COMPARISON:  None. FINDINGS: CT HEAD FINDINGS Brain: No intracranial hemorrhage. No parenchymal contusion. No midline shift or mass effect. Basilar cisterns are patent. No skull base fracture. No fluid in the paranasal sinuses or mastoid air cells. Orbits are normal. There are periventricular and subcortical white matter hypodensities. Generalized cortical atrophy. Vascular: No hyperdense vessel or unexpected calcification. Skull: Normal. Negative for fracture or focal lesion. Sinuses/Orbits: Paranasal sinuses and mastoid air cells are clear. Orbits are clear. Other: None. CT MAXILLOFACIAL FINDINGS Osseous: Zygomatic arches are intact. Orbital walls are intact. No fracture of the maxillary sinus walls. Pterygoid plates intact.  No nasal bone fracture. Mandibular condyles are located. No mandibular fracture. Orbits: Globes are normal. No proptosis. Intraconal contents are clear. Sinuses: No fluid in the paranasal sinuses. Soft tissues: Unremarkable CT CERVICAL SPINE FINDINGS Alignment: Normal alignment of the cervical vertebral bodies. Skull base and vertebrae: Normal craniocervical junction. No loss of vertebral body height or disc height. Normal facet articulation. No  evidence of fracture. Soft tissues and spinal canal: No prevertebral soft tissue swelling. No perispinal or epidural hematoma. Disc levels:  Unremarkable Upper chest: Clear Other: Nodule enlargement of the RIGHT lobe of thyroid gland 3.5 by 2.3 cm. Nodule enlargement of the LEFT gland lesser degree. IMPRESSION: 1. No intracranial trauma. 2. Atrophy and white matter microvascular disease. 3. No facial bone fracture. 4. No cervical spine fracture. 5. Enlargement of the thyroid gland is likely benign goiter. Electronically Signed   By: Suzy Bouchard M.D.   On: 10/06/2017 17:01   Ct Maxillofacial Wo Contrast  Result Date: 10/06/2017 CLINICAL DATA:  Unwitnessed fall.  Laceration EXAM: CT HEAD WITHOUT CONTRAST CT MAXILLOFACIAL WITHOUT CONTRAST CT CERVICAL SPINE WITHOUT CONTRAST TECHNIQUE: Multidetector CT imaging of the head, cervical spine, and maxillofacial structures were performed using the standard protocol without intravenous contrast. Multiplanar CT image reconstructions of the cervical spine and maxillofacial structures were also generated. COMPARISON:  None. FINDINGS: CT HEAD FINDINGS Brain: No intracranial hemorrhage. No parenchymal contusion. No midline shift or mass effect. Basilar cisterns are patent. No skull base fracture. No fluid in the paranasal sinuses or mastoid air cells. Orbits are normal. There are periventricular and subcortical white matter hypodensities. Generalized cortical atrophy. Vascular: No hyperdense vessel or unexpected calcification. Skull:  Normal. Negative for fracture or focal lesion. Sinuses/Orbits: Paranasal sinuses and mastoid air cells are clear. Orbits are clear. Other: None. CT MAXILLOFACIAL FINDINGS Osseous: Zygomatic arches are intact. Orbital walls are intact. No fracture of the maxillary sinus walls. Pterygoid plates intact. No nasal bone fracture. Mandibular condyles are located. No mandibular fracture. Orbits: Globes are normal. No proptosis. Intraconal contents are clear. Sinuses: No fluid in the paranasal sinuses. Soft tissues: Unremarkable CT CERVICAL SPINE FINDINGS Alignment: Normal alignment of the cervical vertebral bodies. Skull base and vertebrae: Normal craniocervical junction. No loss of vertebral body height or disc height. Normal facet articulation. No evidence of fracture. Soft tissues and spinal canal: No prevertebral soft tissue swelling. No perispinal or epidural hematoma. Disc levels:  Unremarkable Upper chest: Clear Other: Nodule enlargement of the RIGHT lobe of thyroid gland 3.5 by 2.3 cm. Nodule enlargement of the LEFT gland lesser degree. IMPRESSION: 1. No intracranial trauma. 2. Atrophy and white matter microvascular disease. 3. No facial bone fracture. 4. No cervical spine fracture. 5. Enlargement of the thyroid gland is likely benign goiter. Electronically Signed   By: Suzy Bouchard M.D.   On: 10/06/2017 17:01    Procedures Procedures (including critical care time)  Medications Ordered in ED Medications  lidocaine (XYLOCAINE) 2 % (with pres) injection 200 mg (has no administration in time range)  bupivacaine-epinephrine (MARCAINE W/ EPI) 0.25% -1:200000 injection 1.7 mL (has no administration in time range)  Tdap (BOOSTRIX) injection 0.5 mL (has no administration in time range)     Initial Impression / Assessment and Plan / ED Course  I have reviewed the triage vital signs and the nursing notes.  Pertinent labs & imaging results that were available during my care of the patient were reviewed by  me and considered in my medical decision making (see chart for details).   84 year old female with history of dementia here with suspected mechanical fall.  According to report from the facility, patient was at baseline then they heard a fall in the other room, with laceration.  On exam, patient is superficial laceration along the mucosal surface of the lip that does not involve the vermilion border and does not need closure at this time.  Patient  in agreement.  Otherwise, screening lab work obtained given unclear history, and is unremarkable.  EKG without arrhythmia or ischemia.  I called the patient's son, who confirms that she is at her baseline mental status.  Given known history of recurrent mechanical falls with similar history and no concerning lab work or arrhythmia in the ED, feels reasonable to discharge her home.  The patient would like to return to her facility, and son is in agreement.  Final Clinical Impressions(s) / ED Diagnoses   Final diagnoses:  Fall, initial encounter  Abrasion of face, initial encounter    ED Discharge Orders        Ordered    bacitracin ointment  2 times daily     10/06/17 2044       Duffy Bruce, MD 10/06/17 2046

## 2017-10-06 NOTE — ED Notes (Signed)
ED Provider at bedside. Ellender Hose

## 2017-10-06 NOTE — ED Notes (Signed)
ED Provider at bedside cleaning pt lip laceration

## 2017-10-06 NOTE — ED Triage Notes (Signed)
Pt brought in by [facility] from [place] facility for an unwitnessed fall. Per EMS staff heard pt fall, found her on her right side. Small laceration noted to pt upper up, swelling noted to pt right cheek. Pt denies neck or back pain, denies LOC. Per EMS pt has hx of dementia and is A+Ox3 at baseline, is disoriented to time. Per EMS pt on blood thinners. Pt in NAD on arrival.

## 2017-10-09 DIAGNOSIS — R296 Repeated falls: Secondary | ICD-10-CM | POA: Diagnosis not present

## 2017-10-09 DIAGNOSIS — R2681 Unsteadiness on feet: Secondary | ICD-10-CM | POA: Diagnosis not present

## 2017-10-16 DIAGNOSIS — R634 Abnormal weight loss: Secondary | ICD-10-CM | POA: Diagnosis not present

## 2017-10-20 DIAGNOSIS — F331 Major depressive disorder, recurrent, moderate: Secondary | ICD-10-CM | POA: Diagnosis not present

## 2017-10-20 DIAGNOSIS — F419 Anxiety disorder, unspecified: Secondary | ICD-10-CM | POA: Diagnosis not present

## 2017-10-23 DIAGNOSIS — K5900 Constipation, unspecified: Secondary | ICD-10-CM | POA: Diagnosis not present

## 2017-10-23 DIAGNOSIS — I10 Essential (primary) hypertension: Secondary | ICD-10-CM | POA: Diagnosis not present

## 2017-10-23 DIAGNOSIS — F5105 Insomnia due to other mental disorder: Secondary | ICD-10-CM | POA: Diagnosis not present

## 2017-10-23 DIAGNOSIS — F0280 Dementia in other diseases classified elsewhere without behavioral disturbance: Secondary | ICD-10-CM | POA: Diagnosis not present

## 2017-10-23 DIAGNOSIS — I482 Chronic atrial fibrillation: Secondary | ICD-10-CM | POA: Diagnosis not present

## 2017-10-23 DIAGNOSIS — F339 Major depressive disorder, recurrent, unspecified: Secondary | ICD-10-CM | POA: Diagnosis not present

## 2017-10-23 DIAGNOSIS — I5030 Unspecified diastolic (congestive) heart failure: Secondary | ICD-10-CM | POA: Diagnosis not present

## 2017-10-23 DIAGNOSIS — F0150 Vascular dementia without behavioral disturbance: Secondary | ICD-10-CM | POA: Diagnosis not present

## 2017-10-23 DIAGNOSIS — F0390 Unspecified dementia without behavioral disturbance: Secondary | ICD-10-CM | POA: Diagnosis not present

## 2017-10-23 DIAGNOSIS — E785 Hyperlipidemia, unspecified: Secondary | ICD-10-CM | POA: Diagnosis not present

## 2017-10-23 DIAGNOSIS — F419 Anxiety disorder, unspecified: Secondary | ICD-10-CM | POA: Diagnosis not present

## 2017-10-23 DIAGNOSIS — E46 Unspecified protein-calorie malnutrition: Secondary | ICD-10-CM | POA: Diagnosis not present

## 2017-10-23 DIAGNOSIS — M6281 Muscle weakness (generalized): Secondary | ICD-10-CM | POA: Diagnosis not present

## 2017-10-23 DIAGNOSIS — E119 Type 2 diabetes mellitus without complications: Secondary | ICD-10-CM | POA: Diagnosis not present

## 2017-10-23 DIAGNOSIS — G301 Alzheimer's disease with late onset: Secondary | ICD-10-CM | POA: Diagnosis not present

## 2017-10-23 DIAGNOSIS — F331 Major depressive disorder, recurrent, moderate: Secondary | ICD-10-CM | POA: Diagnosis not present

## 2017-10-30 DIAGNOSIS — R2681 Unsteadiness on feet: Secondary | ICD-10-CM | POA: Diagnosis not present

## 2017-10-30 DIAGNOSIS — K5900 Constipation, unspecified: Secondary | ICD-10-CM | POA: Diagnosis not present

## 2017-10-30 DIAGNOSIS — R269 Unspecified abnormalities of gait and mobility: Secondary | ICD-10-CM | POA: Diagnosis not present

## 2017-10-30 DIAGNOSIS — R634 Abnormal weight loss: Secondary | ICD-10-CM | POA: Diagnosis not present

## 2017-10-30 DIAGNOSIS — R296 Repeated falls: Secondary | ICD-10-CM | POA: Diagnosis not present

## 2017-10-30 DIAGNOSIS — R32 Unspecified urinary incontinence: Secondary | ICD-10-CM | POA: Diagnosis not present

## 2017-10-30 DIAGNOSIS — I482 Chronic atrial fibrillation: Secondary | ICD-10-CM | POA: Diagnosis not present

## 2017-10-30 DIAGNOSIS — E46 Unspecified protein-calorie malnutrition: Secondary | ICD-10-CM | POA: Diagnosis not present

## 2017-10-30 DIAGNOSIS — R627 Adult failure to thrive: Secondary | ICD-10-CM | POA: Diagnosis not present

## 2017-10-30 DIAGNOSIS — I5030 Unspecified diastolic (congestive) heart failure: Secondary | ICD-10-CM | POA: Diagnosis not present

## 2017-10-30 DIAGNOSIS — I10 Essential (primary) hypertension: Secondary | ICD-10-CM | POA: Diagnosis not present

## 2017-10-30 DIAGNOSIS — E119 Type 2 diabetes mellitus without complications: Secondary | ICD-10-CM | POA: Diagnosis not present

## 2017-11-03 DIAGNOSIS — F419 Anxiety disorder, unspecified: Secondary | ICD-10-CM | POA: Diagnosis not present

## 2017-11-03 DIAGNOSIS — F331 Major depressive disorder, recurrent, moderate: Secondary | ICD-10-CM | POA: Diagnosis not present

## 2017-11-04 DIAGNOSIS — Z5181 Encounter for therapeutic drug level monitoring: Secondary | ICD-10-CM | POA: Diagnosis not present

## 2017-11-05 ENCOUNTER — Emergency Department (HOSPITAL_COMMUNITY): Payer: Medicare Other

## 2017-11-05 ENCOUNTER — Encounter (HOSPITAL_COMMUNITY): Payer: Self-pay

## 2017-11-05 ENCOUNTER — Emergency Department (HOSPITAL_COMMUNITY)
Admission: EM | Admit: 2017-11-05 | Discharge: 2017-11-05 | Disposition: A | Discharge status: Home / Self Care | Payer: Medicare Other | Attending: Emergency Medicine | Admitting: Emergency Medicine

## 2017-11-05 DIAGNOSIS — Z7901 Long term (current) use of anticoagulants: Secondary | ICD-10-CM | POA: Insufficient documentation

## 2017-11-05 DIAGNOSIS — E119 Type 2 diabetes mellitus without complications: Secondary | ICD-10-CM | POA: Diagnosis not present

## 2017-11-05 DIAGNOSIS — S22030A Wedge compression fracture of third thoracic vertebra, initial encounter for closed fracture: Secondary | ICD-10-CM | POA: Diagnosis not present

## 2017-11-05 DIAGNOSIS — R220 Localized swelling, mass and lump, head: Secondary | ICD-10-CM | POA: Diagnosis not present

## 2017-11-05 DIAGNOSIS — I110 Hypertensive heart disease with heart failure: Secondary | ICD-10-CM | POA: Diagnosis not present

## 2017-11-05 DIAGNOSIS — S0990XA Unspecified injury of head, initial encounter: Secondary | ICD-10-CM | POA: Diagnosis not present

## 2017-11-05 DIAGNOSIS — S0101XA Laceration without foreign body of scalp, initial encounter: Secondary | ICD-10-CM | POA: Diagnosis not present

## 2017-11-05 DIAGNOSIS — Y929 Unspecified place or not applicable: Secondary | ICD-10-CM | POA: Diagnosis not present

## 2017-11-05 DIAGNOSIS — Z96641 Presence of right artificial hip joint: Secondary | ICD-10-CM | POA: Insufficient documentation

## 2017-11-05 DIAGNOSIS — Y999 Unspecified external cause status: Secondary | ICD-10-CM | POA: Diagnosis not present

## 2017-11-05 DIAGNOSIS — R51 Headache: Secondary | ICD-10-CM | POA: Diagnosis not present

## 2017-11-05 DIAGNOSIS — I10 Essential (primary) hypertension: Secondary | ICD-10-CM | POA: Insufficient documentation

## 2017-11-05 DIAGNOSIS — M7989 Other specified soft tissue disorders: Secondary | ICD-10-CM | POA: Diagnosis not present

## 2017-11-05 DIAGNOSIS — Y9301 Activity, walking, marching and hiking: Secondary | ICD-10-CM | POA: Insufficient documentation

## 2017-11-05 DIAGNOSIS — Z79899 Other long term (current) drug therapy: Secondary | ICD-10-CM | POA: Insufficient documentation

## 2017-11-05 DIAGNOSIS — S199XXA Unspecified injury of neck, initial encounter: Secondary | ICD-10-CM | POA: Diagnosis not present

## 2017-11-05 DIAGNOSIS — W19XXXA Unspecified fall, initial encounter: Secondary | ICD-10-CM | POA: Diagnosis not present

## 2017-11-05 DIAGNOSIS — W0110XA Fall on same level from slipping, tripping and stumbling with subsequent striking against unspecified object, initial encounter: Secondary | ICD-10-CM | POA: Insufficient documentation

## 2017-11-05 DIAGNOSIS — I5032 Chronic diastolic (congestive) heart failure: Secondary | ICD-10-CM | POA: Insufficient documentation

## 2017-11-05 DIAGNOSIS — S22000A Wedge compression fracture of unspecified thoracic vertebra, initial encounter for closed fracture: Secondary | ICD-10-CM | POA: Diagnosis not present

## 2017-11-05 DIAGNOSIS — Z794 Long term (current) use of insulin: Secondary | ICD-10-CM | POA: Insufficient documentation

## 2017-11-05 DIAGNOSIS — S3992XA Unspecified injury of lower back, initial encounter: Secondary | ICD-10-CM | POA: Diagnosis not present

## 2017-11-05 DIAGNOSIS — S299XXA Unspecified injury of thorax, initial encounter: Secondary | ICD-10-CM | POA: Diagnosis not present

## 2017-11-05 MED ORDER — LIDOCAINE 5 % EX PTCH
1.0000 | MEDICATED_PATCH | CUTANEOUS | Status: DC
  Administered 2017-11-05: 1 via TRANSDERMAL
  Filled 2017-11-05: qty 1

## 2017-11-05 MED ORDER — LISINOPRIL 20 MG PO TABS
20.0000 mg | ORAL_TABLET | Freq: Once | ORAL | Status: AC
  Administered 2017-11-05: 20 mg via ORAL
  Filled 2017-11-05: qty 1

## 2017-11-05 NOTE — ED Notes (Signed)
Cleansed pt's hair/ and lac to posterior head. Bleeding controlled. Pt tolerated well

## 2017-11-05 NOTE — ED Provider Notes (Signed)
[facility] EMERGENCY DEPARTMENT Provider Note   CSN: [id number] Arrival date & time: 11/05/17  1015     History   Chief Complaint Chief Complaint  Patient presents with  . Fall    HPI Cynthia Wright is a 84 y.o. female.  HPI  Patient is a 84 year old female with a history of atrial fibrillation (on Eliquis), diastolic CHF, diabetes mellitus, dilated cardiomyopathy, HTN, HLD presenting for mechanical fall and head laceration. Patient reports that she was trying to get back to her bed this morning when got caught on her clothing, fell backwards, and hit her head on furniture in her room. Per EMS report, staff found patient in her room. Patient denies LOC, headache, weakness, numbness, dizziness, lightheadedness, or shortness of breath. Patient unable to provide any further history about what medications were given to her this morning.  Past Medical History:  Diagnosis Date  . Atrial fibrillation ([place])    s/p DCCV 07/2012  . Benign diastolic hypertension   . Chronic anticoagulation   . Chronic diastolic CHF (congestive heart failure) (HCC)    EF now 65% by echo 10/2012 with grade II diasotlid dysfunction  . Diabetes mellitus without complication ([place])   . Dilated cardiomyopathy secondary to tachycardia ([place]) 07/2012   2D echo 10/2012 showed EF 65% with grade II diastolic dysfunction  . Elevated cholesterol   . Hyperlipidemia   . Hypertension   . Liver cyst   . Memory loss     Patient Active Problem List   Diagnosis Date Noted  . Elevated LFTs 12/22/2016  . HCAP (healthcare-associated pneumonia)   . Dementia with behavioral disturbance   . Hypokalemia   . Postoperative anemia   . Hyperlipidemia   . E. coli UTI   . Closed displaced fracture of right femoral neck ([place]) 09/28/2016  . Fracture of left leg 09/27/2016  . Fall 07/12/2016  . Leukocytosis 07/12/2016  . Type 2 diabetes mellitus with hyperlipidemia ([place]) 07/12/2016  . Acetabular fracture ([place])  07/11/2016  . Dilated cardiomyopathy secondary to tachycardia ([place]) 12/14/2013  . Syncope 06/14/2013  . Bradycardia 05/11/2013  . Fatigue 05/11/2013  . Chronic diastolic CHF (congestive heart failure) ([place]) 01/06/2013  . Mitral regurgitation 08/06/2012  . PAF (paroxysmal atrial fibrillation) ([place]) 08/02/2012  . Benign essential HTN 08/02/2012  . Pure hypercholesterolemia 08/02/2012  . Liver cyst 08/02/2012    Past Surgical History:  Procedure Laterality Date  . ANTERIOR APPROACH HEMI HIP ARTHROPLASTY Right 09/28/2016   Procedure: ANTERIOR APPROACH HEMI HIP ARTHROPLASTY;  Surgeon: Leandrew Koyanagi, MD;  Location: [facility];  Service: Orthopedics;  Laterality: Right;  . APPENDECTOMY    . BACK SURGERY     herniated disc  . CARDIOVERSION N/A 08/09/2012   Procedure: CARDIOVERSION;  Surgeon: Sueanne Margarita, MD;  Location: [facility];  Service: Cardiovascular;  Laterality: N/A;  . CARDIOVERSION N/A 09/28/2014   Procedure: CARDIOVERSION;  Surgeon: Sueanne Margarita, MD;  Location: [facility] ENDOSCOPY;  Service: Cardiovascular;  Laterality: N/A;  . CHOLECYSTECTOMY    . TEE WITHOUT CARDIOVERSION N/A 08/09/2012   Procedure: TRANSESOPHAGEAL ECHOCARDIOGRAM (TEE);  Surgeon: Sueanne Margarita, MD;  Location: [facility] ENDOSCOPY;  Service: Cardiovascular;  Laterality: N/A;  talk to angie in [place].  . TONSILLECTOMY       OB History   None      Home Medications    Prior to Admission medications   Medication Sig Start Date End Date Taking? Authorizing Provider  acetaminophen (TYLENOL) 500 MG tablet Take 500  mg by mouth 2 (two) times daily. Take 500mg  by mouth two times a day for pain   Yes [provider]  amiodarone (PACERONE) 200 MG tablet Take 1 tablet (200 mg total) by mouth daily. Patient taking differently: Take 100 mg by mouth daily.  10/20/16  Yes Rosita Fire, MD  buPROPion [facility] SR) 150 MG 12 hr tablet Take 150 mg 2 (two) times daily by mouth.   Yes [provider]  busPIRone  (BUSPAR) 7.5 MG tablet Take 7.5 mg by mouth 2 (two) times daily. 08/14/17  Yes [provider]  diltiazem (CARDIZEM CD) 240 MG 24 hr capsule Take 1 capsule (240 mg total) by mouth daily. 12/16/16 11/05/17 Yes Turner, Eber Hong, MD  divalproex (DEPAKOTE SPRINKLE) 125 MG capsule Take 125 mg by mouth 2 (two) times daily. 10/23/17  Yes [provider]  docusate sodium (COLACE) 100 MG capsule Take 200 mg by mouth at bedtime.    Yes [provider]  donepezil (ARICEPT) 10 MG tablet Take 10 mg by mouth at bedtime.   Yes [provider]  ELIQUIS 2.5 MG TABS tablet TAKE 1 TABLET (2.5 MG TOTAL) BY MOUTH 2 (TWO) TIMES DAILY. 04/21/16  Yes Turner, Eber Hong, MD  ENSURE (ENSURE) Take 237 mLs by mouth 3 (three) times daily after meals.   Yes [provider]  furosemide (LASIX) 40 MG tablet Take 1 tablet (40 mg total) by mouth daily. 12/27/16  Yes Lavina Hamman, MD  iron polysaccharides (NIFEREX) 150 MG capsule Take 1 capsule (150 mg total) by mouth daily. 09/30/16  Yes Barton Dubois, MD  KLOR-CON 20 MEQ packet Take 20 mEq by mouth daily. With food 07/31/17  Yes [provider]  lisinopril (PRINIVIL,ZESTRIL) 10 MG tablet Take 20 mg by mouth daily. 08/17/17  Yes [provider]  LORazepam (ATIVAN) 0.5 MG tablet Take 0.25 mg by mouth 2 (two) times daily. Additional 0.25mg  every 24 hours if needed for anxiety.   Yes [provider]  Melatonin 10 MG TABS Take 10 mg by mouth at bedtime.    Yes [provider]  memantine (NAMENDA) 5 MG tablet Take 5 mg by mouth 2 (two) times daily.    Yes [provider]  polyethylene glycol (MIRALAX / GLYCOLAX) packet Take 17 g by mouth daily.   Yes [provider]  rosuvastatin (CRESTOR) 5 MG tablet Take 5 mg by mouth at bedtime.    Yes [provider]  sitaGLIPtin (JANUVIA) 100 MG tablet Take 100 mg by mouth daily.   Yes [provider]  traMADol (ULTRAM) 50 MG tablet Take 50 mg by  mouth 2 (two) times daily. Take 50mg  by mouth twice a day for pain   Yes [provider]  bacitracin ointment Apply 1 application topically 2 (two) times daily. For 10 days or until healed Patient not taking: Reported on 11/05/2017 10/06/17   Duffy Bruce, MD  ibuprofen (ADVIL,MOTRIN) 800 MG tablet Take 1 tablet (800 mg total) by mouth every 8 (eight) hours as needed. Patient not taking: Reported on 11/05/2017 04/12/17   Dalia Heading, PA-C  potassium chloride SA (KLOR-CON M20) 20 MEQ tablet Take 2 tablets (40 mEq total) by mouth every other day. Patient not taking: Reported on 11/05/2017 05/23/16   Sueanne Margarita, MD  traMADol (ULTRAM) 50 MG tablet Take 1 tablet (50 mg total) by mouth every 6 (six) hours as needed for severe pain. Patient not taking: Reported on 08/23/2017 04/12/17   Lawyer,  Harrell Gave, PA-C    Family History Family History  Problem Relation Age of Onset  . Heart disease Mother   . CAD Mother   . Heart disease Father   . CAD Father   . Pancreatic cancer Brother     Social History Social History   Tobacco Use  . Smoking status: Former Research scientist (life sciences)  . Smokeless tobacco: Never Used  Substance Use Topics  . Alcohol use: Yes    Alcohol/week: 1.0 - 2.0 standard drinks    Types: 1 - 2 Glasses of wine per week  . Drug use: No     Allergies   Codeine; Lortab [hydrocodone-acetaminophen]; and Hydrocodone   Review of Systems Review of Systems  Constitutional: Negative for chills, diaphoresis and fever.  Respiratory: Negative for shortness of breath.   Cardiovascular: Negative for chest pain.  Musculoskeletal: Negative for gait problem.  Skin: Positive for wound. Negative for color change.  Neurological: Negative for dizziness, tremors, light-headedness, numbness and headaches.  All other systems reviewed and are negative.    Physical Exam Updated Vital Signs BP (!) 190/81 (BP Location: Left Arm)   Pulse 79   Resp 16   SpO2 100%   Physical Exam    Constitutional:  Thin and frail appearing; pleasantly confused and cooperative.  HENT:  Head: Normocephalic.  Mouth/Throat: Oropharynx is clear and moist.  1 cm laceration of left occipital region.  Eyes: Pupils are equal, round, and reactive to light. Conjunctivae and EOM are normal.  Neck: Normal range of motion. Neck supple.  Cardiovascular: Normal rate, regular rhythm, S1 normal and S2 normal.  No murmur heard. Pulmonary/Chest: Effort normal and breath sounds normal. She has no wheezes. She has no rales.  Abdominal: Soft. She exhibits no distension. There is no tenderness. There is no guarding.  Musculoskeletal:  No midline TTP of cervical spine. Midline TTP of upper thoracic spine. No midline TTP of lumbar spine.  No TTP of b/l shoulders, elbows, wrists, ribs, b/l hips, knees, or ankles.   Lymphadenopathy:    She has no cervical adenopathy.  Neurological: She is alert.  Oriented to person and place. Normal phonation with goal-directed speech. Follows 2-step commands without difficulty. Cranial nerves grossly intact. Strength 5/5 upper and lower extremities.  Patient moves extremities symmetrically and with good coordination. Normal gait with 1-person assist, per pt baseline.   Skin: Skin is warm and dry. No rash noted. There is erythema.  Ecchymosis and rubor of b/l LEs.   Psychiatric: She has a normal mood and affect. Her behavior is normal. Judgment and thought content normal.  Nursing note and vitals reviewed.    ED Treatments / Results  Labs (all labs ordered are listed, but only abnormal results are displayed) Labs Reviewed - No data to display  EKG None  Radiology Dg Lumbar Spine Complete  Result Date: 11/05/2017 CLINICAL DATA:  84 year old female with a history fall EXAM: LUMBAR SPINE - COMPLETE 4+ VIEW COMPARISON:  None. FINDINGS: Lumbar Spine: Osteopenia. Lumbar vertebral elements maintain normal alignment without evidence of anterolisthesis, retrolisthesis,  subluxation. No acute fracture line identified. Vertebral body heights maintained. Mild disc space narrowing, most pronounced at L4-L5 and L5-S1. Oblique images demonstrate no displaced pars defect. Facet hypertrophy is most pronounced at L4-L5 and L5-S1. Calcifications of the abdominal aorta. IMPRESSION: Negative for acute fracture or malalignment of the lumbar spine. Mild disc disease and facet disease of L4-S1. Electronically Signed   By: Corrie Mckusick D.O.   On: 11/05/2017 11:34  Procedures Procedures (including critical care time)  Medications Ordered in ED Medications - No data to display   Initial Impression / Assessment and Plan / ED Course  I have reviewed the triage vital signs and the nursing notes.  Pertinent labs & imaging results that were available during my care of the patient were reviewed by me and considered in my medical decision making (see chart for details).  Clinical Course as of Nov 06 1550  Thu Nov 05, 2017  1323 Noted. Will give pt BP meds in dept.  BP(!): 201/70 [AM]  1523 BP(!): 201/70 [AM]  1550 Observed patient ambulate with assistance.  No complaints.   [AM]  1551 Presumed new T3 compression fracture. Old rib fracture. None acute noted.   CT Chest Wo Contrast [AM]    Clinical Course User Index [AM] Albesa Seen, PA-C    Patient well appearing and neurologically intact. Suspect mechanical fall. Patient remained neurologically intact with no evidence of change in neuro status x 5 hours in ED. Patient mental status per baseline per family at bedside in ED.   Workup significant for presumed new moderate T3 compression fracture with 40% height loss and 2 mm retropulsion. Pt w/ pain controlled in ED and referred to neurosurgery. CT head, cervical spine, and xray lumbar spine w/o acute traumatic findings.   1 cm superficial laceration of occipital region repaired with hair apposition. Well aligned. Tdap updated earlier in July 2019.  Return  precautions given to family and care facility for any change in neurologic status of LEs, weakness, numbness, increasing pain. Patient's daughter in law expressed that patient will soon be moving into a memory unit.   This is a shared visit with Dr. Virgel Manifold. Patient was independently evaluated by this attending physician. Attending physician consulted in evaluation and discharge management.  Final Clinical Impressions(s) / ED Diagnoses   Final diagnoses:  Fall, initial encounter  Elevated blood pressure reading with diagnosis of hypertension  Closed wedge compression fracture of third thoracic vertebra, initial encounter [facility])    ED Discharge Orders    None       Tamala Julian 11/06/17 0018    Virgel Manifold, MD 11/06/17 [phone]

## 2017-11-05 NOTE — ED Triage Notes (Signed)
Pt arrived via [facility]; pt from [facility] on [address] ([id number]); pt had fall attempting to get in bed and states that her feet slipped underneath her. Pt c/o back pain and laceration to occipital lobe; hematoma. Denies any other complaints; pt on blood thinner; A/Ox4; 180/80 BP ; 75 HR; 134 CBG; 14-16 R; 4/10 pain

## 2017-11-05 NOTE — ED Notes (Signed)
Pt ambulated in hallway with a walker and performed toileting ADLs with SBA and verbal cues to perform steps. Pt denied dizziness while ambulating, denied increased pain while ambulating

## 2017-11-05 NOTE — ED Notes (Signed)
Pt transported to CT ?

## 2017-11-05 NOTE — Discharge Instructions (Addendum)
Cynthia Wright was diagnosed with a compression fracture that is new in the T3 vertebrae.  This will need follow-up with [place] neurosurgery.  She does not need to wear a brace at present.  She should be immediately referred back to the emergency department if she develops any worsening pain, complaints of weakness or numbness in her legs, numbness in groin, loss of bowel or bladder control.   For pain, I recommend Tylenol, 650 mg every 6 hours as needed for pain.  Cynthia Wright was given her lisinopril today.  She does not need this medication dosed again today.  Thank you for allowing [place] to participate in your care today.

## 2017-11-06 DIAGNOSIS — E785 Hyperlipidemia, unspecified: Secondary | ICD-10-CM | POA: Diagnosis not present

## 2017-11-06 DIAGNOSIS — F0280 Dementia in other diseases classified elsewhere without behavioral disturbance: Secondary | ICD-10-CM | POA: Diagnosis not present

## 2017-11-06 DIAGNOSIS — M79669 Pain in unspecified lower leg: Secondary | ICD-10-CM | POA: Diagnosis not present

## 2017-11-06 DIAGNOSIS — E1159 Type 2 diabetes mellitus with other circulatory complications: Secondary | ICD-10-CM | POA: Diagnosis not present

## 2017-11-06 DIAGNOSIS — N183 Chronic kidney disease, stage 3 (moderate): Secondary | ICD-10-CM | POA: Diagnosis not present

## 2017-11-06 DIAGNOSIS — E46 Unspecified protein-calorie malnutrition: Secondary | ICD-10-CM | POA: Diagnosis not present

## 2017-11-06 DIAGNOSIS — I4891 Unspecified atrial fibrillation: Secondary | ICD-10-CM | POA: Diagnosis not present

## 2017-11-06 DIAGNOSIS — F0151 Vascular dementia with behavioral disturbance: Secondary | ICD-10-CM | POA: Diagnosis not present

## 2017-11-06 DIAGNOSIS — F339 Major depressive disorder, recurrent, unspecified: Secondary | ICD-10-CM | POA: Diagnosis not present

## 2017-11-06 DIAGNOSIS — F419 Anxiety disorder, unspecified: Secondary | ICD-10-CM | POA: Diagnosis not present

## 2017-11-06 DIAGNOSIS — I5030 Unspecified diastolic (congestive) heart failure: Secondary | ICD-10-CM | POA: Diagnosis not present

## 2017-11-06 DIAGNOSIS — G894 Chronic pain syndrome: Secondary | ICD-10-CM | POA: Diagnosis not present

## 2017-11-06 DIAGNOSIS — F331 Major depressive disorder, recurrent, moderate: Secondary | ICD-10-CM | POA: Diagnosis not present

## 2017-11-06 DIAGNOSIS — G301 Alzheimer's disease with late onset: Secondary | ICD-10-CM | POA: Diagnosis not present

## 2017-11-06 DIAGNOSIS — F5105 Insomnia due to other mental disorder: Secondary | ICD-10-CM | POA: Diagnosis not present

## 2017-11-09 DIAGNOSIS — I5030 Unspecified diastolic (congestive) heart failure: Secondary | ICD-10-CM | POA: Diagnosis not present

## 2017-11-09 DIAGNOSIS — N183 Chronic kidney disease, stage 3 (moderate): Secondary | ICD-10-CM | POA: Diagnosis not present

## 2017-11-09 DIAGNOSIS — E46 Unspecified protein-calorie malnutrition: Secondary | ICD-10-CM | POA: Diagnosis not present

## 2017-11-09 DIAGNOSIS — I4891 Unspecified atrial fibrillation: Secondary | ICD-10-CM | POA: Diagnosis not present

## 2017-11-09 DIAGNOSIS — E1159 Type 2 diabetes mellitus with other circulatory complications: Secondary | ICD-10-CM | POA: Diagnosis not present

## 2017-11-09 DIAGNOSIS — F0151 Vascular dementia with behavioral disturbance: Secondary | ICD-10-CM | POA: Diagnosis not present

## 2017-11-10 DIAGNOSIS — E1159 Type 2 diabetes mellitus with other circulatory complications: Secondary | ICD-10-CM | POA: Diagnosis not present

## 2017-11-10 DIAGNOSIS — I5030 Unspecified diastolic (congestive) heart failure: Secondary | ICD-10-CM | POA: Diagnosis not present

## 2017-11-10 DIAGNOSIS — E46 Unspecified protein-calorie malnutrition: Secondary | ICD-10-CM | POA: Diagnosis not present

## 2017-11-10 DIAGNOSIS — I4891 Unspecified atrial fibrillation: Secondary | ICD-10-CM | POA: Diagnosis not present

## 2017-11-10 DIAGNOSIS — N183 Chronic kidney disease, stage 3 (moderate): Secondary | ICD-10-CM | POA: Diagnosis not present

## 2017-11-10 DIAGNOSIS — F0151 Vascular dementia with behavioral disturbance: Secondary | ICD-10-CM | POA: Diagnosis not present

## 2017-11-11 DIAGNOSIS — I4891 Unspecified atrial fibrillation: Secondary | ICD-10-CM | POA: Diagnosis not present

## 2017-11-11 DIAGNOSIS — N183 Chronic kidney disease, stage 3 (moderate): Secondary | ICD-10-CM | POA: Diagnosis not present

## 2017-11-11 DIAGNOSIS — E46 Unspecified protein-calorie malnutrition: Secondary | ICD-10-CM | POA: Diagnosis not present

## 2017-11-11 DIAGNOSIS — I5030 Unspecified diastolic (congestive) heart failure: Secondary | ICD-10-CM | POA: Diagnosis not present

## 2017-11-11 DIAGNOSIS — E1159 Type 2 diabetes mellitus with other circulatory complications: Secondary | ICD-10-CM | POA: Diagnosis not present

## 2017-11-11 DIAGNOSIS — F0151 Vascular dementia with behavioral disturbance: Secondary | ICD-10-CM | POA: Diagnosis not present

## 2017-11-13 DIAGNOSIS — I4891 Unspecified atrial fibrillation: Secondary | ICD-10-CM | POA: Diagnosis not present

## 2017-11-13 DIAGNOSIS — E1159 Type 2 diabetes mellitus with other circulatory complications: Secondary | ICD-10-CM | POA: Diagnosis not present

## 2017-11-13 DIAGNOSIS — E46 Unspecified protein-calorie malnutrition: Secondary | ICD-10-CM | POA: Diagnosis not present

## 2017-11-13 DIAGNOSIS — I5030 Unspecified diastolic (congestive) heart failure: Secondary | ICD-10-CM | POA: Diagnosis not present

## 2017-11-13 DIAGNOSIS — F0151 Vascular dementia with behavioral disturbance: Secondary | ICD-10-CM | POA: Diagnosis not present

## 2017-11-13 DIAGNOSIS — N183 Chronic kidney disease, stage 3 (moderate): Secondary | ICD-10-CM | POA: Diagnosis not present

## 2017-11-17 DIAGNOSIS — I5030 Unspecified diastolic (congestive) heart failure: Secondary | ICD-10-CM | POA: Diagnosis not present

## 2017-11-17 DIAGNOSIS — F0151 Vascular dementia with behavioral disturbance: Secondary | ICD-10-CM | POA: Diagnosis not present

## 2017-11-17 DIAGNOSIS — F419 Anxiety disorder, unspecified: Secondary | ICD-10-CM | POA: Diagnosis not present

## 2017-11-17 DIAGNOSIS — E46 Unspecified protein-calorie malnutrition: Secondary | ICD-10-CM | POA: Diagnosis not present

## 2017-11-17 DIAGNOSIS — F331 Major depressive disorder, recurrent, moderate: Secondary | ICD-10-CM | POA: Diagnosis not present

## 2017-11-17 DIAGNOSIS — I4891 Unspecified atrial fibrillation: Secondary | ICD-10-CM | POA: Diagnosis not present

## 2017-11-17 DIAGNOSIS — E1159 Type 2 diabetes mellitus with other circulatory complications: Secondary | ICD-10-CM | POA: Diagnosis not present

## 2017-11-17 DIAGNOSIS — N183 Chronic kidney disease, stage 3 (moderate): Secondary | ICD-10-CM | POA: Diagnosis not present

## 2017-11-19 DIAGNOSIS — E1159 Type 2 diabetes mellitus with other circulatory complications: Secondary | ICD-10-CM | POA: Diagnosis not present

## 2017-11-19 DIAGNOSIS — F0151 Vascular dementia with behavioral disturbance: Secondary | ICD-10-CM | POA: Diagnosis not present

## 2017-11-19 DIAGNOSIS — N183 Chronic kidney disease, stage 3 (moderate): Secondary | ICD-10-CM | POA: Diagnosis not present

## 2017-11-19 DIAGNOSIS — I4891 Unspecified atrial fibrillation: Secondary | ICD-10-CM | POA: Diagnosis not present

## 2017-11-19 DIAGNOSIS — I5030 Unspecified diastolic (congestive) heart failure: Secondary | ICD-10-CM | POA: Diagnosis not present

## 2017-11-19 DIAGNOSIS — E46 Unspecified protein-calorie malnutrition: Secondary | ICD-10-CM | POA: Diagnosis not present

## 2017-11-20 DIAGNOSIS — E46 Unspecified protein-calorie malnutrition: Secondary | ICD-10-CM | POA: Diagnosis not present

## 2017-11-20 DIAGNOSIS — I4891 Unspecified atrial fibrillation: Secondary | ICD-10-CM | POA: Diagnosis not present

## 2017-11-20 DIAGNOSIS — E1159 Type 2 diabetes mellitus with other circulatory complications: Secondary | ICD-10-CM | POA: Diagnosis not present

## 2017-11-20 DIAGNOSIS — F0151 Vascular dementia with behavioral disturbance: Secondary | ICD-10-CM | POA: Diagnosis not present

## 2017-11-20 DIAGNOSIS — I5030 Unspecified diastolic (congestive) heart failure: Secondary | ICD-10-CM | POA: Diagnosis not present

## 2017-11-20 DIAGNOSIS — N183 Chronic kidney disease, stage 3 (moderate): Secondary | ICD-10-CM | POA: Diagnosis not present

## 2017-11-20 DIAGNOSIS — F419 Anxiety disorder, unspecified: Secondary | ICD-10-CM | POA: Diagnosis not present

## 2017-11-24 DIAGNOSIS — N183 Chronic kidney disease, stage 3 (moderate): Secondary | ICD-10-CM | POA: Diagnosis not present

## 2017-11-24 DIAGNOSIS — E1159 Type 2 diabetes mellitus with other circulatory complications: Secondary | ICD-10-CM | POA: Diagnosis not present

## 2017-11-24 DIAGNOSIS — I5030 Unspecified diastolic (congestive) heart failure: Secondary | ICD-10-CM | POA: Diagnosis not present

## 2017-11-24 DIAGNOSIS — F0151 Vascular dementia with behavioral disturbance: Secondary | ICD-10-CM | POA: Diagnosis not present

## 2017-11-24 DIAGNOSIS — E46 Unspecified protein-calorie malnutrition: Secondary | ICD-10-CM | POA: Diagnosis not present

## 2017-11-24 DIAGNOSIS — I4891 Unspecified atrial fibrillation: Secondary | ICD-10-CM | POA: Diagnosis not present

## 2017-11-25 DIAGNOSIS — Z79899 Other long term (current) drug therapy: Secondary | ICD-10-CM | POA: Diagnosis not present

## 2017-11-26 DIAGNOSIS — E785 Hyperlipidemia, unspecified: Secondary | ICD-10-CM | POA: Diagnosis not present

## 2017-11-26 DIAGNOSIS — E119 Type 2 diabetes mellitus without complications: Secondary | ICD-10-CM | POA: Diagnosis not present

## 2017-11-26 DIAGNOSIS — I482 Chronic atrial fibrillation: Secondary | ICD-10-CM | POA: Diagnosis not present

## 2017-11-26 DIAGNOSIS — I10 Essential (primary) hypertension: Secondary | ICD-10-CM | POA: Diagnosis not present

## 2017-11-26 DIAGNOSIS — F339 Major depressive disorder, recurrent, unspecified: Secondary | ICD-10-CM | POA: Diagnosis not present

## 2017-11-26 DIAGNOSIS — I5030 Unspecified diastolic (congestive) heart failure: Secondary | ICD-10-CM | POA: Diagnosis not present

## 2017-11-26 DIAGNOSIS — N183 Chronic kidney disease, stage 3 (moderate): Secondary | ICD-10-CM | POA: Diagnosis not present

## 2017-11-26 DIAGNOSIS — F0151 Vascular dementia with behavioral disturbance: Secondary | ICD-10-CM | POA: Diagnosis not present

## 2017-11-26 DIAGNOSIS — F331 Major depressive disorder, recurrent, moderate: Secondary | ICD-10-CM | POA: Diagnosis not present

## 2017-11-26 DIAGNOSIS — F0390 Unspecified dementia without behavioral disturbance: Secondary | ICD-10-CM | POA: Diagnosis not present

## 2017-11-26 DIAGNOSIS — I4891 Unspecified atrial fibrillation: Secondary | ICD-10-CM | POA: Diagnosis not present

## 2017-11-26 DIAGNOSIS — K5900 Constipation, unspecified: Secondary | ICD-10-CM | POA: Diagnosis not present

## 2017-11-26 DIAGNOSIS — E46 Unspecified protein-calorie malnutrition: Secondary | ICD-10-CM | POA: Diagnosis not present

## 2017-11-26 DIAGNOSIS — E1159 Type 2 diabetes mellitus with other circulatory complications: Secondary | ICD-10-CM | POA: Diagnosis not present

## 2017-11-26 DIAGNOSIS — M6281 Muscle weakness (generalized): Secondary | ICD-10-CM | POA: Diagnosis not present

## 2017-11-26 DIAGNOSIS — F0150 Vascular dementia without behavioral disturbance: Secondary | ICD-10-CM | POA: Diagnosis not present

## 2017-11-27 DIAGNOSIS — I5030 Unspecified diastolic (congestive) heart failure: Secondary | ICD-10-CM | POA: Diagnosis not present

## 2017-11-27 DIAGNOSIS — E46 Unspecified protein-calorie malnutrition: Secondary | ICD-10-CM | POA: Diagnosis not present

## 2017-11-27 DIAGNOSIS — I4891 Unspecified atrial fibrillation: Secondary | ICD-10-CM | POA: Diagnosis not present

## 2017-11-27 DIAGNOSIS — N183 Chronic kidney disease, stage 3 (moderate): Secondary | ICD-10-CM | POA: Diagnosis not present

## 2017-11-27 DIAGNOSIS — F0151 Vascular dementia with behavioral disturbance: Secondary | ICD-10-CM | POA: Diagnosis not present

## 2017-11-27 DIAGNOSIS — E1159 Type 2 diabetes mellitus with other circulatory complications: Secondary | ICD-10-CM | POA: Diagnosis not present

## 2017-11-29 DIAGNOSIS — E785 Hyperlipidemia, unspecified: Secondary | ICD-10-CM | POA: Diagnosis not present

## 2017-11-29 DIAGNOSIS — I4891 Unspecified atrial fibrillation: Secondary | ICD-10-CM | POA: Diagnosis not present

## 2017-11-29 DIAGNOSIS — F0151 Vascular dementia with behavioral disturbance: Secondary | ICD-10-CM | POA: Diagnosis not present

## 2017-11-29 DIAGNOSIS — E46 Unspecified protein-calorie malnutrition: Secondary | ICD-10-CM | POA: Diagnosis not present

## 2017-11-29 DIAGNOSIS — F339 Major depressive disorder, recurrent, unspecified: Secondary | ICD-10-CM | POA: Diagnosis not present

## 2017-11-29 DIAGNOSIS — E1159 Type 2 diabetes mellitus with other circulatory complications: Secondary | ICD-10-CM | POA: Diagnosis not present

## 2017-11-29 DIAGNOSIS — I5030 Unspecified diastolic (congestive) heart failure: Secondary | ICD-10-CM | POA: Diagnosis not present

## 2017-11-29 DIAGNOSIS — N183 Chronic kidney disease, stage 3 (moderate): Secondary | ICD-10-CM | POA: Diagnosis not present

## 2017-12-03 DIAGNOSIS — Q845 Enlarged and hypertrophic nails: Secondary | ICD-10-CM | POA: Diagnosis not present

## 2017-12-03 DIAGNOSIS — I739 Peripheral vascular disease, unspecified: Secondary | ICD-10-CM | POA: Diagnosis not present

## 2017-12-03 DIAGNOSIS — L603 Nail dystrophy: Secondary | ICD-10-CM | POA: Diagnosis not present

## 2017-12-03 DIAGNOSIS — B351 Tinea unguium: Secondary | ICD-10-CM | POA: Diagnosis not present

## 2017-12-04 DIAGNOSIS — G301 Alzheimer's disease with late onset: Secondary | ICD-10-CM | POA: Diagnosis not present

## 2017-12-04 DIAGNOSIS — F0280 Dementia in other diseases classified elsewhere without behavioral disturbance: Secondary | ICD-10-CM | POA: Diagnosis not present

## 2017-12-04 DIAGNOSIS — F419 Anxiety disorder, unspecified: Secondary | ICD-10-CM | POA: Diagnosis not present

## 2017-12-04 DIAGNOSIS — F5105 Insomnia due to other mental disorder: Secondary | ICD-10-CM | POA: Diagnosis not present

## 2017-12-04 DIAGNOSIS — F331 Major depressive disorder, recurrent, moderate: Secondary | ICD-10-CM | POA: Diagnosis not present

## 2017-12-08 DIAGNOSIS — F331 Major depressive disorder, recurrent, moderate: Secondary | ICD-10-CM | POA: Diagnosis not present

## 2017-12-08 DIAGNOSIS — F419 Anxiety disorder, unspecified: Secondary | ICD-10-CM | POA: Diagnosis not present

## 2017-12-11 DIAGNOSIS — M79669 Pain in unspecified lower leg: Secondary | ICD-10-CM | POA: Diagnosis not present

## 2017-12-11 DIAGNOSIS — G894 Chronic pain syndrome: Secondary | ICD-10-CM | POA: Diagnosis not present

## 2017-12-15 ENCOUNTER — Encounter (HOSPITAL_COMMUNITY): Payer: Self-pay | Admitting: Emergency Medicine

## 2017-12-15 ENCOUNTER — Emergency Department (HOSPITAL_COMMUNITY)
Admission: EM | Admit: 2017-12-15 | Discharge: 2017-12-15 | Disposition: A | Discharge status: Home / Self Care | Attending: Emergency Medicine | Admitting: Emergency Medicine

## 2017-12-15 ENCOUNTER — Emergency Department (HOSPITAL_COMMUNITY)

## 2017-12-15 DIAGNOSIS — Z7984 Long term (current) use of oral hypoglycemic drugs: Secondary | ICD-10-CM | POA: Insufficient documentation

## 2017-12-15 DIAGNOSIS — I5032 Chronic diastolic (congestive) heart failure: Secondary | ICD-10-CM | POA: Insufficient documentation

## 2017-12-15 DIAGNOSIS — Z23 Encounter for immunization: Secondary | ICD-10-CM | POA: Diagnosis not present

## 2017-12-15 DIAGNOSIS — Y92002 Bathroom of unspecified non-institutional (private) residence single-family (private) house as the place of occurrence of the external cause: Secondary | ICD-10-CM | POA: Insufficient documentation

## 2017-12-15 DIAGNOSIS — Z7901 Long term (current) use of anticoagulants: Secondary | ICD-10-CM | POA: Diagnosis not present

## 2017-12-15 DIAGNOSIS — Y999 Unspecified external cause status: Secondary | ICD-10-CM | POA: Diagnosis not present

## 2017-12-15 DIAGNOSIS — N3000 Acute cystitis without hematuria: Secondary | ICD-10-CM | POA: Diagnosis not present

## 2017-12-15 DIAGNOSIS — Z87891 Personal history of nicotine dependence: Secondary | ICD-10-CM | POA: Insufficient documentation

## 2017-12-15 DIAGNOSIS — E119 Type 2 diabetes mellitus without complications: Secondary | ICD-10-CM | POA: Insufficient documentation

## 2017-12-15 DIAGNOSIS — I110 Hypertensive heart disease with heart failure: Secondary | ICD-10-CM | POA: Diagnosis not present

## 2017-12-15 DIAGNOSIS — T148XXA Other injury of unspecified body region, initial encounter: Secondary | ICD-10-CM | POA: Diagnosis not present

## 2017-12-15 DIAGNOSIS — Y939 Activity, unspecified: Secondary | ICD-10-CM | POA: Diagnosis not present

## 2017-12-15 DIAGNOSIS — W010XXA Fall on same level from slipping, tripping and stumbling without subsequent striking against object, initial encounter: Secondary | ICD-10-CM | POA: Diagnosis not present

## 2017-12-15 DIAGNOSIS — W19XXXA Unspecified fall, initial encounter: Secondary | ICD-10-CM

## 2017-12-15 DIAGNOSIS — R279 Unspecified lack of coordination: Secondary | ICD-10-CM | POA: Diagnosis not present

## 2017-12-15 DIAGNOSIS — Z79899 Other long term (current) drug therapy: Secondary | ICD-10-CM | POA: Insufficient documentation

## 2017-12-15 DIAGNOSIS — F0390 Unspecified dementia without behavioral disturbance: Secondary | ICD-10-CM | POA: Diagnosis not present

## 2017-12-15 DIAGNOSIS — S51812A Laceration without foreign body of left forearm, initial encounter: Secondary | ICD-10-CM | POA: Insufficient documentation

## 2017-12-15 DIAGNOSIS — Z743 Need for continuous supervision: Secondary | ICD-10-CM | POA: Diagnosis not present

## 2017-12-15 DIAGNOSIS — S8992XA Unspecified injury of left lower leg, initial encounter: Secondary | ICD-10-CM | POA: Diagnosis not present

## 2017-12-15 DIAGNOSIS — S59902A Unspecified injury of left elbow, initial encounter: Secondary | ICD-10-CM | POA: Diagnosis not present

## 2017-12-15 DIAGNOSIS — M79605 Pain in left leg: Secondary | ICD-10-CM | POA: Diagnosis not present

## 2017-12-15 LAB — URINALYSIS, ROUTINE W REFLEX MICROSCOPIC
Bilirubin Urine: NEGATIVE
Glucose, UA: NEGATIVE mg/dL
Hgb urine dipstick: NEGATIVE
Ketones, ur: 5 mg/dL — AB
Nitrite: NEGATIVE
Protein, ur: NEGATIVE mg/dL
Specific Gravity, Urine: 1.016 (ref 1.005–1.030)
WBC, UA: >50 WBC/hpf — ABNORMAL HIGH (ref 0–5)
pH: 5 (ref 5.0–8.0)

## 2017-12-15 MED ORDER — CEPHALEXIN 500 MG PO CAPS: 500.0000 mg | ORAL_CAPSULE | Freq: Three times a day (TID) | ORAL | 0 refills | Status: AC

## 2017-12-15 MED ORDER — CEPHALEXIN 250 MG PO CAPS
500.0000 mg | ORAL_CAPSULE | Freq: Once | ORAL | Status: AC
  Administered 2017-12-15: 500 mg via ORAL
  Filled 2017-12-15: qty 2

## 2017-12-15 MED ORDER — TETANUS-DIPHTH-ACELL PERTUSSIS 5-2.5-18.5 LF-MCG/0.5 IM SUSP
0.5000 mL | Freq: Once | INTRAMUSCULAR | Status: AC
  Administered 2017-12-15: 0.5 mL via INTRAMUSCULAR
  Filled 2017-12-15: qty 0.5

## 2017-12-15 NOTE — ED Provider Notes (Signed)
[facility] EMERGENCY DEPARTMENT Provider Note   CSN: [id number] Arrival date & time: 12/15/17  0115     History   Chief Complaint Chief Complaint  Patient presents with  . Fall    HPI Cynthia Wright is a 84 y.o. female.  HPI  This is a 84 year old female with a history of atrial fibrillation on Eliquis, diastolic heart failure, diabetes, dementia who presents following a fall.  Per patient she was in the bathroom when she fell forward hitting her left elbow.  She denies hitting her head or loss of consciousness.  She is unable to tell me how she fell.  She does not think she passed out.  She denies any pain.  She does reportedly have a skin tear to her left lower arm.  She denies any leg pain or headache.  Patient is oriented to herself and place but not time.  Level 5 caveat for dementia.  Past Medical History:  Diagnosis Date  . Atrial fibrillation ([place])    s/p DCCV 07/2012  . Benign diastolic hypertension   . Chronic anticoagulation   . Chronic diastolic CHF (congestive heart failure) (H[facility])    EF now 65% by echo 10/2012 with grade II diasotlid dysfunction  . Diabetes mellitus without complication ([place])   . Dilated cardiomyopathy secondary to tachycardia ([place]) 07/2012   2D echo 10/2012 showed EF 65% with grade II diastolic dysfunction  . Elevated cholesterol   . Hyperlipidemia   . Hypertension   . Liver cyst   . Memory loss     Patient Active Problem List   Diagnosis Date Noted  . Elevated LFTs 12/22/2016  . HCAP (healthcare-associated pneumonia)   . Dementia with behavioral disturbance   . Hypokalemia   . Postoperative anemia   . Hyperlipidemia   . E. coli UTI   . Closed displaced fracture of right femoral neck ([place]) 09/28/2016  . Fracture of left leg 09/27/2016  . Fall 07/12/2016  . Leukocytosis 07/12/2016  . Type 2 diabetes mellitus with hyperlipidemia ([place]) 07/12/2016  . Acetabular fracture ([place]) 07/11/2016  . Dilated cardiomyopathy  secondary to tachycardia ([place]) 12/14/2013  . Syncope 06/14/2013  . Bradycardia 05/11/2013  . Fatigue 05/11/2013  . Chronic diastolic CHF (congestive heart failure) ([place]) 01/06/2013  . Mitral regurgitation 08/06/2012  . PAF (paroxysmal atrial fibrillation) ([place]) 08/02/2012  . Benign essential HTN 08/02/2012  . Pure hypercholesterolemia 08/02/2012  . Liver cyst 08/02/2012    Past Surgical History:  Procedure Laterality Date  . ANTERIOR APPROACH HEMI HIP ARTHROPLASTY Right 09/28/2016   Procedure: ANTERIOR APPROACH HEMI HIP ARTHROPLASTY;  Surgeon: Leandrew Koyanagi, MD;  Location: [facility];  Service: Orthopedics;  Laterality: Right;  . APPENDECTOMY    . BACK SURGERY     herniated disc  . CARDIOVERSION N/A 08/09/2012   Procedure: CARDIOVERSION;  Surgeon: Sueanne Margarita, MD;  Location: [facility];  Service: Cardiovascular;  Laterality: N/A;  . CARDIOVERSION N/A 09/28/2014   Procedure: CARDIOVERSION;  Surgeon: Sueanne Margarita, MD;  Location: [facility] ENDOS[facility];  Service: Cardiovascular;  Laterality: N/A;  . CHOLECYSTECTOMY    . TEE WITHOUT CARDIOVERSION N/A 08/09/2012   Procedure: TRANSESOPHAGEAL ECHOCARDIOGRAM (TEE);  Surgeon: Sueanne Margarita, MD;  Location: [facility] ENDOSCOPY;  Service: Cardiovascular;  Laterality: N/A;  talk to angie in [place].  . TONSILLECTOMY       OB History   None      Home Medications    Prior to Admission medications   Medication Sig Start  Date End Date Taking? Authorizing Provider  acetaminophen (TYLENOL) 500 MG tablet Take 500 mg by mouth 2 (two) times daily. Take 500mg  by mouth two times a day for pain    [provider]  amiodarone (PACERONE) 200 MG tablet Take 1 tablet (200 mg total) by mouth daily. Patient taking differently: Take 100 mg by mouth daily.  10/20/16   Rosita Fire, MD  bacitracin ointment Apply 1 application topically 2 (two) times daily. For 10 days or until healed Patient not taking: Reported on 11/05/2017 10/06/17   Duffy Bruce, MD    buPROPion [facility] SR) 150 MG 12 hr tablet Take 150 mg 2 (two) times daily by mouth.    [provider]  busPIRone (BUSPAR) 7.5 MG tablet Take 7.5 mg by mouth 2 (two) times daily. 08/14/17   [provider]  cephALEXin (KEFLEX) 500 MG capsule Take 1 capsule (500 mg total) by mouth 3 (three) times daily. 12/15/17   Horton, Barbette Hair, MD  diltiazem (CARDIZEM CD) 240 MG 24 hr capsule Take 1 capsule (240 mg total) by mouth daily. 12/16/16 11/05/17  Sueanne Margarita, MD  divalproex (DEPAKOTE SPRINKLE) 125 MG capsule Take 125 mg by mouth 2 (two) times daily. 10/23/17   [provider]  docusate sodium (COLACE) 100 MG capsule Take 200 mg by mouth at bedtime.     [provider]  donepezil (ARICEPT) 10 MG tablet Take 10 mg by mouth at bedtime.    [provider]  ELIQUIS 2.5 MG TABS tablet TAKE 1 TABLET (2.5 MG TOTAL) BY MOUTH 2 (TWO) TIMES DAILY. 04/21/16   Sueanne Margarita, MD  ENSURE (ENSURE) Take 237 mLs by mouth 3 (three) times daily after meals.    [provider]  furosemide (LASIX) 40 MG tablet Take 1 tablet (40 mg total) by mouth daily. 12/27/16   Lavina Hamman, MD  ibuprofen (ADVIL,MOTRIN) 800 MG tablet Take 1 tablet (800 mg total) by mouth every 8 (eight) hours as needed. Patient not taking: Reported on 11/05/2017 04/12/17   Dalia Heading, PA-C  iron polysaccharides (NIFEREX) 150 MG capsule Take 1 capsule (150 mg total) by mouth daily. 09/30/16   Barton Dubois, MD  KLOR-CON 20 MEQ packet Take 20 mEq by mouth daily. With food 07/31/17   [provider]  lisinopril (PRINIVIL,ZESTRIL) 10 MG tablet Take 20 mg by mouth daily. 08/17/17   [provider]  LORazepam (ATIVAN) 0.5 MG tablet Take 0.25 mg by mouth 2 (two) times daily. Additional 0.25mg  every 24 hours if needed for anxiety.    [provider]  Melatonin 10 MG TABS Take 10 mg by mouth at bedtime.     [provider]  memantine (NAMENDA) 5 MG tablet Take 5 mg  by mouth 2 (two) times daily.     [provider]  polyethylene glycol (MIRALAX / GLYCOLAX) packet Take 17 g by mouth daily.    [provider]  potassium chloride SA (KLOR-CON M20) 20 MEQ tablet Take 2 tablets (40 mEq total) by mouth every other day. Patient not taking: Reported on 11/05/2017 05/23/16   Sueanne Margarita, MD  rosuvastatin (CRESTOR) 5 MG tablet Take 5 mg by mouth at bedtime.     [provider]  sitaGLIPtin (JANUVIA) 100 MG tablet Take 100 mg by mouth daily.    [provider]  traMADol (ULTRAM) 50 MG tablet Take 1 tablet (50 mg total) by mouth every 6 (six) hours as needed for severe pain.  Patient not taking: Reported on 08/23/2017 04/12/17   Dalia Heading, PA-C  traMADol (ULTRAM) 50 MG tablet Take 50 mg by mouth 2 (two) times daily. Take 50mg  by mouth twice a day for pain    [provider]    Family History Family History  Problem Relation Age of Onset  . Heart disease Mother   . CAD Mother   . Heart disease Father   . CAD Father   . Pancreatic cancer Brother     Social History Social History   Tobacco Use  . Smoking status: Former Research scientist (life sciences)  . Smokeless tobacco: Never Used  Substance Use Topics  . Alcohol use: Yes    Alcohol/week: 1.0 - 2.0 standard drinks    Types: 1 - 2 Glasses of wine per week  . Drug use: No     Allergies   Codeine; Lortab [hydrocodone-acetaminophen]; and Hydrocodone   Review of Systems Review of Systems  Constitutional: Negative for fever.  Respiratory: Negative for shortness of breath.   Cardiovascular: Negative for chest pain.  Skin: Positive for wound.  Neurological: Negative for syncope.  Psychiatric/Behavioral: Positive for confusion.  All other systems reviewed and are negative.    Physical Exam Updated Vital Signs BP (!) 193/71 (BP Location: Right Arm)   Pulse 66   Temp (!) 97.5 F (36.4 C) (Oral)   Resp 18   Ht 1.651 m (5\' 5" )   Wt 44.5 kg   SpO2 99%   BMI 16.31  kg/m   Physical Exam  Constitutional: She appears well-developed and well-nourished.  HENT:  Head: Normocephalic and atraumatic.  Eyes: Pupils are equal, round, and reactive to light.  Neck: Normal range of motion. Neck supple.  No midline C-spine tenderness to palpation  Cardiovascular: Normal rate, regular rhythm and normal heart sounds.  Pulmonary/Chest: Effort normal and breath sounds normal. No respiratory distress. She has no wheezes.  Abdominal: Soft. Bowel sounds are normal. There is no tenderness.  Musculoskeletal:  Normal range of motion left elbow, bilateral hips and knees, no obvious deformity 2+ DP pulses  Neurological: She is alert.  Oriented to self and place, not time, fluent speech, follows commands, 5 out of 5 strength in all 4 extremities  Skin: Skin is warm and dry.  Small skin tear left forearm  Psychiatric: She has a normal mood and affect.  Nursing note and vitals reviewed.    ED Treatments / Results  Labs (all labs ordered are listed, but only abnormal results are displayed) Labs Reviewed  URINALYSIS, ROUTINE W REFLEX MICROSCOPIC - Abnormal; Notable for the following components:      Result Value   APPearance CLOUDY (*)    Ketones, ur 5 (*)    Leukocytes, UA LARGE (*)    WBC, UA >50 (*)    Bacteria, UA MANY (*)    All other components within normal limits  URINE CULTURE    EKG EKG Interpretation  Date/Time:  Tuesday December 15 2017 02:13:32 EDT Ventricular Rate:  68 PR Interval:  192 QRS Duration: 96 QT Interval:  428 QTC Calculation: 455 R Axis:   105 Text Interpretation:  Normal sinus rhythm Rightward axis Anteroseptal infarct , age undetermined Abnormal ECG Confirmed by Thayer Jew [phone]) on 12/15/2017 4:21:00 AM   Radiology Ct Head Wo Contrast  Result Date: 12/15/2017 CLINICAL DATA:  Unwitnessed fall trying to go to the restroom. Small skin tear to the left elbow. Patient is on blood thinners. EXAM: CT HEAD WITHOUT CONTRAST CT  CERVICAL  SPINE WITHOUT CONTRAST TECHNIQUE: Multidetector CT imaging of the head and cervical spine was performed following the standard protocol without intravenous contrast. Multiplanar CT image reconstructions of the cervical spine were also generated. COMPARISON:  11/05/2017 FINDINGS: CT HEAD FINDINGS BRAIN: There is sulcal and ventricular prominence consistent with superficial and central atrophy. No intraparenchymal hemorrhage, mass effect nor midline shift. Periventricular and subcortical white matter hypodensities consistent with chronic small vessel ischemic disease are identified. No acute large vascular territory infarcts. No abnormal extra-axial fluid collections. Basal cisterns are not effaced and midline. VASCULAR: Moderate calcific atherosclerosis of the carotid siphons. SKULL: No skull fracture. No significant scalp soft tissue swelling. SINUSES/ORBITS: The mastoid air-cells are clear. The included paranasal sinuses are well-aerated.The included ocular globes and orbital contents are non-suspicious. Bilateral lens replacements. OTHER: None. CT CERVICAL SPINE FINDINGS Alignment: Maintained cervical lordosis. Skull base and vertebrae: No acute fracture. Stable sclerotic focus in the T1 vertebral body unchanged, likely representing a stable bone island in the absence of any underlying pre-existing malignancy history that may predispose to osteoblastic disease. Soft tissues and spinal canal: No prevertebral fluid or swelling. No visible canal hematoma. Disc levels: Degenerative disc disease C5-6 with small posterior marginal osteophytes and uncovertebral joint osteoarthritis. Mild left-sided foraminal encroachment at C5-6. Upper chest: Biapical pleuroparenchymal scarring.  No dominant mass. Other: Atherosclerosis of the extracranial carotid arteries left greater than right. Large multinodular goiter as before. IMPRESSION: 1. Atrophy without acute intracranial abnormality. Chronic small vessel ischemia. 2.  No acute cervical spine fracture. 3. Multinodular goiter. 4. Extracranial carotid arteriosclerosis. Electronically Signed   By: Ashley Royalty M.D.   On: 12/15/2017 04:01   Ct Cervical Spine Wo Contrast  Result Date: 12/15/2017 CLINICAL DATA:  Unwitnessed fall trying to go to the restroom. Small skin tear to the left elbow. Patient is on blood thinners. EXAM: CT HEAD WITHOUT CONTRAST CT CERVICAL SPINE WITHOUT CONTRAST TECHNIQUE: Multidetector CT imaging of the head and cervical spine was performed following the standard protocol without intravenous contrast. Multiplanar CT image reconstructions of the cervical spine were also generated. COMPARISON:  11/05/2017 FINDINGS: CT HEAD FINDINGS BRAIN: There is sulcal and ventricular prominence consistent with superficial and central atrophy. No intraparenchymal hemorrhage, mass effect nor midline shift. Periventricular and subcortical white matter hypodensities consistent with chronic small vessel ischemic disease are identified. No acute large vascular territory infarcts. No abnormal extra-axial fluid collections. Basal cisterns are not effaced and midline. VASCULAR: Moderate calcific atherosclerosis of the carotid siphons. SKULL: No skull fracture. No significant scalp soft tissue swelling. SINUSES/ORBITS: The mastoid air-cells are clear. The included paranasal sinuses are well-aerated.The included ocular globes and orbital contents are non-suspicious. Bilateral lens replacements. OTHER: None. CT CERVICAL SPINE FINDINGS Alignment: Maintained cervical lordosis. Skull base and vertebrae: No acute fracture. Stable sclerotic focus in the T1 vertebral body unchanged, likely representing a stable bone island in the absence of any underlying pre-existing malignancy history that may predispose to osteoblastic disease. Soft tissues and spinal canal: No prevertebral fluid or swelling. No visible canal hematoma. Disc levels: Degenerative disc disease C5-6 with small posterior  marginal osteophytes and uncovertebral joint osteoarthritis. Mild left-sided foraminal encroachment at C5-6. Upper chest: Biapical pleuroparenchymal scarring.  No dominant mass. Other: Atherosclerosis of the extracranial carotid arteries left greater than right. Large multinodular goiter as before. IMPRESSION: 1. Atrophy without acute intracranial abnormality. Chronic small vessel ischemia. 2. No acute cervical spine fracture. 3. Multinodular goiter. 4. Extracranial carotid arteriosclerosis. Electronically Signed   By: Meredith Leeds.D.  On: 12/15/2017 04:01    Procedures Procedures (including critical care time)  Medications Ordered in ED Medications  cephALEXin (KEFLEX) capsule 500 mg (has no administration in time range)  Tdap (BOOSTRIX) injection 0.5 mL (has no administration in time range)     Initial Impression / Assessment and Plan / ED Course  I have reviewed the triage vital signs and the nursing notes.  Pertinent labs & imaging results that were available during my care of the patient were reviewed by me and considered in my medical decision making (see chart for details).     Patient presents after an unwitnessed fall.  She is oriented x2.  Only obvious trauma is a skin tear to the left forearm.  She is on blood thinners.  No obvious head trauma; however, given unreliable history and dementia, will obtain head CT.  CT head neck obtained.  Urinalysis also obtained as well as an EKG.  EKG without significant arrhythmia or ischemia.  Urinalysis does show evidence of greater than 50 white cells and white cell clumps with many bacteria.  Urine culture sent.  Patient will be started on Keflex for UTI.  CT scans are reassuring.  At this time I do not feel she needs x-rays as there are no obvious deformities or tenderness on exam.  After history, exam, and medical workup I feel the patient has been appropriately medically screened and is safe for discharge home. Pertinent diagnoses were  discussed with the patient. Patient was given return precautions.   Final Clinical Impressions(s) / ED Diagnoses   Final diagnoses:  Fall, initial encounter  Skin tear of forearm without complication, left, initial encounter  Acute cystitis without hematuria    ED Discharge Orders         Ordered    cephALEXin (KEFLEX) 500 MG capsule  3 times daily     12/15/17 0423           Horton, Barbette Hair, MD 12/15/17 0425

## 2017-12-15 NOTE — ED Notes (Signed)
Report given to [facility] @ [facility]

## 2017-12-15 NOTE — Discharge Instructions (Addendum)
You was seen today after a fall.  Your CT scans are negative.  It does appear that you have a urinary tract infection.  Take antibiotics as prescribed.  Apply bacitracin ointment to the skin tear to the left arm.

## 2017-12-15 NOTE — ED Triage Notes (Signed)
BIB EMS from [place]. Pt had unwitnessed fall while attempting to go to restroom. Pt presents with small skin tear to L elbow. Pt does take Eliquis. Pt has no complaints, denies pain. VSS.

## 2017-12-17 DIAGNOSIS — F419 Anxiety disorder, unspecified: Secondary | ICD-10-CM | POA: Diagnosis not present

## 2017-12-17 LAB — URINE CULTURE: Culture: >=100000 — AB

## 2017-12-18 ENCOUNTER — Telehealth: Payer: Self-pay | Admitting: *Deleted

## 2017-12-18 NOTE — Telephone Encounter (Signed)
Post ED Visit - Positive Culture Follow-up  Culture report reviewed by antimicrobial stewardship pharmacist:  []  Cynthia Wright, Pharm.D. []  Cynthia Wright, Pharm.D., BCPS AQ-ID []  Cynthia Wright, Pharm.D., BCPS []  Cynthia Wright, Pharm.D., BCPS [x]  Cynthia Wright, [place].D., BCPS, AAHIVP []  Cynthia Wright, Pharm.D., BCPS, AAHIVP []  Cynthia Wright, PharmD, BCPS []  Cynthia Wright, PharmD, BCPS []  Cynthia Wright, PharmD, BCPS []  Cynthia Wright, PharmD  Positive urine culture Treated with Cephalexin, organism sensitive to the same and no further patient follow-up is required at this time.  [name] [facility] 12/18/2017, 11:20 AM

## 2017-12-29 DIAGNOSIS — N183 Chronic kidney disease, stage 3 (moderate): Secondary | ICD-10-CM | POA: Diagnosis not present

## 2017-12-29 DIAGNOSIS — F339 Major depressive disorder, recurrent, unspecified: Secondary | ICD-10-CM | POA: Diagnosis not present

## 2017-12-29 DIAGNOSIS — F419 Anxiety disorder, unspecified: Secondary | ICD-10-CM | POA: Diagnosis not present

## 2017-12-29 DIAGNOSIS — E1159 Type 2 diabetes mellitus with other circulatory complications: Secondary | ICD-10-CM | POA: Diagnosis not present

## 2017-12-29 DIAGNOSIS — I5030 Unspecified diastolic (congestive) heart failure: Secondary | ICD-10-CM | POA: Diagnosis not present

## 2017-12-29 DIAGNOSIS — E46 Unspecified protein-calorie malnutrition: Secondary | ICD-10-CM | POA: Diagnosis not present

## 2017-12-29 DIAGNOSIS — F331 Major depressive disorder, recurrent, moderate: Secondary | ICD-10-CM | POA: Diagnosis not present

## 2017-12-29 DIAGNOSIS — F0151 Vascular dementia with behavioral disturbance: Secondary | ICD-10-CM | POA: Diagnosis not present

## 2017-12-29 DIAGNOSIS — I4891 Unspecified atrial fibrillation: Secondary | ICD-10-CM | POA: Diagnosis not present

## 2017-12-29 DIAGNOSIS — E785 Hyperlipidemia, unspecified: Secondary | ICD-10-CM | POA: Diagnosis not present

## 2017-12-31 DIAGNOSIS — F331 Major depressive disorder, recurrent, moderate: Secondary | ICD-10-CM | POA: Diagnosis not present

## 2017-12-31 DIAGNOSIS — F419 Anxiety disorder, unspecified: Secondary | ICD-10-CM | POA: Diagnosis not present

## 2017-12-31 DIAGNOSIS — F5105 Insomnia due to other mental disorder: Secondary | ICD-10-CM | POA: Diagnosis not present

## 2017-12-31 DIAGNOSIS — I5030 Unspecified diastolic (congestive) heart failure: Secondary | ICD-10-CM | POA: Diagnosis not present

## 2017-12-31 DIAGNOSIS — F0151 Vascular dementia with behavioral disturbance: Secondary | ICD-10-CM | POA: Diagnosis not present

## 2017-12-31 DIAGNOSIS — E1159 Type 2 diabetes mellitus with other circulatory complications: Secondary | ICD-10-CM | POA: Diagnosis not present

## 2017-12-31 DIAGNOSIS — F0280 Dementia in other diseases classified elsewhere without behavioral disturbance: Secondary | ICD-10-CM | POA: Diagnosis not present

## 2017-12-31 DIAGNOSIS — I4891 Unspecified atrial fibrillation: Secondary | ICD-10-CM | POA: Diagnosis not present

## 2017-12-31 DIAGNOSIS — N183 Chronic kidney disease, stage 3 (moderate): Secondary | ICD-10-CM | POA: Diagnosis not present

## 2017-12-31 DIAGNOSIS — G301 Alzheimer's disease with late onset: Secondary | ICD-10-CM | POA: Diagnosis not present

## 2017-12-31 DIAGNOSIS — E46 Unspecified protein-calorie malnutrition: Secondary | ICD-10-CM | POA: Diagnosis not present

## 2018-01-01 DIAGNOSIS — E1159 Type 2 diabetes mellitus with other circulatory complications: Secondary | ICD-10-CM | POA: Diagnosis not present

## 2018-01-01 DIAGNOSIS — E46 Unspecified protein-calorie malnutrition: Secondary | ICD-10-CM | POA: Diagnosis not present

## 2018-01-01 DIAGNOSIS — N183 Chronic kidney disease, stage 3 (moderate): Secondary | ICD-10-CM | POA: Diagnosis not present

## 2018-01-01 DIAGNOSIS — I4891 Unspecified atrial fibrillation: Secondary | ICD-10-CM | POA: Diagnosis not present

## 2018-01-01 DIAGNOSIS — F0151 Vascular dementia with behavioral disturbance: Secondary | ICD-10-CM | POA: Diagnosis not present

## 2018-01-01 DIAGNOSIS — I5030 Unspecified diastolic (congestive) heart failure: Secondary | ICD-10-CM | POA: Diagnosis not present

## 2018-01-05 DIAGNOSIS — F0151 Vascular dementia with behavioral disturbance: Secondary | ICD-10-CM | POA: Diagnosis not present

## 2018-01-05 DIAGNOSIS — N183 Chronic kidney disease, stage 3 (moderate): Secondary | ICD-10-CM | POA: Diagnosis not present

## 2018-01-05 DIAGNOSIS — I5030 Unspecified diastolic (congestive) heart failure: Secondary | ICD-10-CM | POA: Diagnosis not present

## 2018-01-05 DIAGNOSIS — E1159 Type 2 diabetes mellitus with other circulatory complications: Secondary | ICD-10-CM | POA: Diagnosis not present

## 2018-01-05 DIAGNOSIS — E46 Unspecified protein-calorie malnutrition: Secondary | ICD-10-CM | POA: Diagnosis not present

## 2018-01-05 DIAGNOSIS — I4891 Unspecified atrial fibrillation: Secondary | ICD-10-CM | POA: Diagnosis not present

## 2018-01-06 DIAGNOSIS — F0151 Vascular dementia with behavioral disturbance: Secondary | ICD-10-CM | POA: Diagnosis not present

## 2018-01-06 DIAGNOSIS — E1159 Type 2 diabetes mellitus with other circulatory complications: Secondary | ICD-10-CM | POA: Diagnosis not present

## 2018-01-06 DIAGNOSIS — N183 Chronic kidney disease, stage 3 (moderate): Secondary | ICD-10-CM | POA: Diagnosis not present

## 2018-01-06 DIAGNOSIS — E46 Unspecified protein-calorie malnutrition: Secondary | ICD-10-CM | POA: Diagnosis not present

## 2018-01-06 DIAGNOSIS — I4891 Unspecified atrial fibrillation: Secondary | ICD-10-CM | POA: Diagnosis not present

## 2018-01-06 DIAGNOSIS — I5030 Unspecified diastolic (congestive) heart failure: Secondary | ICD-10-CM | POA: Diagnosis not present

## 2018-01-07 DIAGNOSIS — E46 Unspecified protein-calorie malnutrition: Secondary | ICD-10-CM | POA: Diagnosis not present

## 2018-01-07 DIAGNOSIS — I5030 Unspecified diastolic (congestive) heart failure: Secondary | ICD-10-CM | POA: Diagnosis not present

## 2018-01-07 DIAGNOSIS — E1159 Type 2 diabetes mellitus with other circulatory complications: Secondary | ICD-10-CM | POA: Diagnosis not present

## 2018-01-07 DIAGNOSIS — Z79899 Other long term (current) drug therapy: Secondary | ICD-10-CM | POA: Diagnosis not present

## 2018-01-07 DIAGNOSIS — I4891 Unspecified atrial fibrillation: Secondary | ICD-10-CM | POA: Diagnosis not present

## 2018-01-07 DIAGNOSIS — N183 Chronic kidney disease, stage 3 (moderate): Secondary | ICD-10-CM | POA: Diagnosis not present

## 2018-01-07 DIAGNOSIS — F0151 Vascular dementia with behavioral disturbance: Secondary | ICD-10-CM | POA: Diagnosis not present

## 2018-01-08 DIAGNOSIS — M79669 Pain in unspecified lower leg: Secondary | ICD-10-CM | POA: Diagnosis not present

## 2018-01-08 DIAGNOSIS — G894 Chronic pain syndrome: Secondary | ICD-10-CM | POA: Diagnosis not present

## 2018-01-08 DIAGNOSIS — W1839XA Other fall on same level, initial encounter: Secondary | ICD-10-CM | POA: Diagnosis not present

## 2018-01-12 DIAGNOSIS — E1159 Type 2 diabetes mellitus with other circulatory complications: Secondary | ICD-10-CM | POA: Diagnosis not present

## 2018-01-12 DIAGNOSIS — N183 Chronic kidney disease, stage 3 (moderate): Secondary | ICD-10-CM | POA: Diagnosis not present

## 2018-01-12 DIAGNOSIS — I4891 Unspecified atrial fibrillation: Secondary | ICD-10-CM | POA: Diagnosis not present

## 2018-01-12 DIAGNOSIS — F419 Anxiety disorder, unspecified: Secondary | ICD-10-CM | POA: Diagnosis not present

## 2018-01-12 DIAGNOSIS — E46 Unspecified protein-calorie malnutrition: Secondary | ICD-10-CM | POA: Diagnosis not present

## 2018-01-12 DIAGNOSIS — F0151 Vascular dementia with behavioral disturbance: Secondary | ICD-10-CM | POA: Diagnosis not present

## 2018-01-12 DIAGNOSIS — I5030 Unspecified diastolic (congestive) heart failure: Secondary | ICD-10-CM | POA: Diagnosis not present

## 2018-01-12 DIAGNOSIS — F331 Major depressive disorder, recurrent, moderate: Secondary | ICD-10-CM | POA: Diagnosis not present

## 2018-01-13 DIAGNOSIS — E1159 Type 2 diabetes mellitus with other circulatory complications: Secondary | ICD-10-CM | POA: Diagnosis not present

## 2018-01-13 DIAGNOSIS — I4891 Unspecified atrial fibrillation: Secondary | ICD-10-CM | POA: Diagnosis not present

## 2018-01-13 DIAGNOSIS — I5030 Unspecified diastolic (congestive) heart failure: Secondary | ICD-10-CM | POA: Diagnosis not present

## 2018-01-13 DIAGNOSIS — N183 Chronic kidney disease, stage 3 (moderate): Secondary | ICD-10-CM | POA: Diagnosis not present

## 2018-01-13 DIAGNOSIS — F0151 Vascular dementia with behavioral disturbance: Secondary | ICD-10-CM | POA: Diagnosis not present

## 2018-01-13 DIAGNOSIS — E46 Unspecified protein-calorie malnutrition: Secondary | ICD-10-CM | POA: Diagnosis not present

## 2018-01-14 DIAGNOSIS — E1159 Type 2 diabetes mellitus with other circulatory complications: Secondary | ICD-10-CM | POA: Diagnosis not present

## 2018-01-14 DIAGNOSIS — I5030 Unspecified diastolic (congestive) heart failure: Secondary | ICD-10-CM | POA: Diagnosis not present

## 2018-01-14 DIAGNOSIS — E46 Unspecified protein-calorie malnutrition: Secondary | ICD-10-CM | POA: Diagnosis not present

## 2018-01-14 DIAGNOSIS — I4891 Unspecified atrial fibrillation: Secondary | ICD-10-CM | POA: Diagnosis not present

## 2018-01-14 DIAGNOSIS — N183 Chronic kidney disease, stage 3 (moderate): Secondary | ICD-10-CM | POA: Diagnosis not present

## 2018-01-14 DIAGNOSIS — F0151 Vascular dementia with behavioral disturbance: Secondary | ICD-10-CM | POA: Diagnosis not present

## 2018-01-15 DIAGNOSIS — R32 Unspecified urinary incontinence: Secondary | ICD-10-CM | POA: Diagnosis not present

## 2018-01-15 DIAGNOSIS — R2681 Unsteadiness on feet: Secondary | ICD-10-CM | POA: Diagnosis not present

## 2018-01-15 DIAGNOSIS — G301 Alzheimer's disease with late onset: Secondary | ICD-10-CM | POA: Diagnosis not present

## 2018-01-15 DIAGNOSIS — E119 Type 2 diabetes mellitus without complications: Secondary | ICD-10-CM | POA: Diagnosis not present

## 2018-01-15 DIAGNOSIS — R627 Adult failure to thrive: Secondary | ICD-10-CM | POA: Diagnosis not present

## 2018-01-15 DIAGNOSIS — E46 Unspecified protein-calorie malnutrition: Secondary | ICD-10-CM | POA: Diagnosis not present

## 2018-01-15 DIAGNOSIS — R296 Repeated falls: Secondary | ICD-10-CM | POA: Diagnosis not present

## 2018-01-15 DIAGNOSIS — F331 Major depressive disorder, recurrent, moderate: Secondary | ICD-10-CM | POA: Diagnosis not present

## 2018-01-15 DIAGNOSIS — F5105 Insomnia due to other mental disorder: Secondary | ICD-10-CM | POA: Diagnosis not present

## 2018-01-15 DIAGNOSIS — F419 Anxiety disorder, unspecified: Secondary | ICD-10-CM | POA: Diagnosis not present

## 2018-01-15 DIAGNOSIS — F0280 Dementia in other diseases classified elsewhere without behavioral disturbance: Secondary | ICD-10-CM | POA: Diagnosis not present

## 2018-01-15 DIAGNOSIS — R634 Abnormal weight loss: Secondary | ICD-10-CM | POA: Diagnosis not present

## 2018-01-15 DIAGNOSIS — I482 Chronic atrial fibrillation, unspecified: Secondary | ICD-10-CM | POA: Diagnosis not present

## 2018-01-15 DIAGNOSIS — I5030 Unspecified diastolic (congestive) heart failure: Secondary | ICD-10-CM | POA: Diagnosis not present

## 2018-01-15 DIAGNOSIS — K5900 Constipation, unspecified: Secondary | ICD-10-CM | POA: Diagnosis not present

## 2018-01-15 DIAGNOSIS — R269 Unspecified abnormalities of gait and mobility: Secondary | ICD-10-CM | POA: Diagnosis not present

## 2018-01-15 DIAGNOSIS — I10 Essential (primary) hypertension: Secondary | ICD-10-CM | POA: Diagnosis not present

## 2018-01-17 DIAGNOSIS — E1159 Type 2 diabetes mellitus with other circulatory complications: Secondary | ICD-10-CM | POA: Diagnosis not present

## 2018-01-17 DIAGNOSIS — F0151 Vascular dementia with behavioral disturbance: Secondary | ICD-10-CM | POA: Diagnosis not present

## 2018-01-17 DIAGNOSIS — I4891 Unspecified atrial fibrillation: Secondary | ICD-10-CM | POA: Diagnosis not present

## 2018-01-17 DIAGNOSIS — N183 Chronic kidney disease, stage 3 (moderate): Secondary | ICD-10-CM | POA: Diagnosis not present

## 2018-01-17 DIAGNOSIS — I5030 Unspecified diastolic (congestive) heart failure: Secondary | ICD-10-CM | POA: Diagnosis not present

## 2018-01-17 DIAGNOSIS — E46 Unspecified protein-calorie malnutrition: Secondary | ICD-10-CM | POA: Diagnosis not present

## 2018-01-18 DIAGNOSIS — F0151 Vascular dementia with behavioral disturbance: Secondary | ICD-10-CM | POA: Diagnosis not present

## 2018-01-18 DIAGNOSIS — I5030 Unspecified diastolic (congestive) heart failure: Secondary | ICD-10-CM | POA: Diagnosis not present

## 2018-01-18 DIAGNOSIS — N183 Chronic kidney disease, stage 3 (moderate): Secondary | ICD-10-CM | POA: Diagnosis not present

## 2018-01-18 DIAGNOSIS — E1159 Type 2 diabetes mellitus with other circulatory complications: Secondary | ICD-10-CM | POA: Diagnosis not present

## 2018-01-18 DIAGNOSIS — E46 Unspecified protein-calorie malnutrition: Secondary | ICD-10-CM | POA: Diagnosis not present

## 2018-01-18 DIAGNOSIS — I4891 Unspecified atrial fibrillation: Secondary | ICD-10-CM | POA: Diagnosis not present

## 2018-01-19 DIAGNOSIS — I4891 Unspecified atrial fibrillation: Secondary | ICD-10-CM | POA: Diagnosis not present

## 2018-01-19 DIAGNOSIS — F0151 Vascular dementia with behavioral disturbance: Secondary | ICD-10-CM | POA: Diagnosis not present

## 2018-01-19 DIAGNOSIS — E46 Unspecified protein-calorie malnutrition: Secondary | ICD-10-CM | POA: Diagnosis not present

## 2018-01-19 DIAGNOSIS — E1159 Type 2 diabetes mellitus with other circulatory complications: Secondary | ICD-10-CM | POA: Diagnosis not present

## 2018-01-19 DIAGNOSIS — N183 Chronic kidney disease, stage 3 (moderate): Secondary | ICD-10-CM | POA: Diagnosis not present

## 2018-01-19 DIAGNOSIS — I5030 Unspecified diastolic (congestive) heart failure: Secondary | ICD-10-CM | POA: Diagnosis not present

## 2018-01-20 DIAGNOSIS — N183 Chronic kidney disease, stage 3 (moderate): Secondary | ICD-10-CM | POA: Diagnosis not present

## 2018-01-20 DIAGNOSIS — F0151 Vascular dementia with behavioral disturbance: Secondary | ICD-10-CM | POA: Diagnosis not present

## 2018-01-20 DIAGNOSIS — E46 Unspecified protein-calorie malnutrition: Secondary | ICD-10-CM | POA: Diagnosis not present

## 2018-01-20 DIAGNOSIS — E1159 Type 2 diabetes mellitus with other circulatory complications: Secondary | ICD-10-CM | POA: Diagnosis not present

## 2018-01-20 DIAGNOSIS — I5030 Unspecified diastolic (congestive) heart failure: Secondary | ICD-10-CM | POA: Diagnosis not present

## 2018-01-20 DIAGNOSIS — I4891 Unspecified atrial fibrillation: Secondary | ICD-10-CM | POA: Diagnosis not present

## 2018-01-21 DIAGNOSIS — E46 Unspecified protein-calorie malnutrition: Secondary | ICD-10-CM | POA: Diagnosis not present

## 2018-01-21 DIAGNOSIS — F0151 Vascular dementia with behavioral disturbance: Secondary | ICD-10-CM | POA: Diagnosis not present

## 2018-01-21 DIAGNOSIS — I5030 Unspecified diastolic (congestive) heart failure: Secondary | ICD-10-CM | POA: Diagnosis not present

## 2018-01-21 DIAGNOSIS — I4891 Unspecified atrial fibrillation: Secondary | ICD-10-CM | POA: Diagnosis not present

## 2018-01-21 DIAGNOSIS — N183 Chronic kidney disease, stage 3 (moderate): Secondary | ICD-10-CM | POA: Diagnosis not present

## 2018-01-21 DIAGNOSIS — E1159 Type 2 diabetes mellitus with other circulatory complications: Secondary | ICD-10-CM | POA: Diagnosis not present

## 2018-01-26 ENCOUNTER — Emergency Department (HOSPITAL_COMMUNITY)

## 2018-01-26 ENCOUNTER — Emergency Department (HOSPITAL_COMMUNITY)
Admission: EM | Admit: 2018-01-26 | Discharge: 2018-01-26 | Disposition: A | Discharge status: Skilled Nursing Facility | Attending: Emergency Medicine | Admitting: Emergency Medicine

## 2018-01-26 DIAGNOSIS — Z79899 Other long term (current) drug therapy: Secondary | ICD-10-CM | POA: Diagnosis not present

## 2018-01-26 DIAGNOSIS — F0390 Unspecified dementia without behavioral disturbance: Secondary | ICD-10-CM | POA: Diagnosis not present

## 2018-01-26 DIAGNOSIS — Y92122 Bedroom in nursing home as the place of occurrence of the external cause: Secondary | ICD-10-CM | POA: Insufficient documentation

## 2018-01-26 DIAGNOSIS — Z96641 Presence of right artificial hip joint: Secondary | ICD-10-CM | POA: Diagnosis not present

## 2018-01-26 DIAGNOSIS — W19XXXA Unspecified fall, initial encounter: Secondary | ICD-10-CM | POA: Diagnosis not present

## 2018-01-26 DIAGNOSIS — Z7901 Long term (current) use of anticoagulants: Secondary | ICD-10-CM | POA: Diagnosis not present

## 2018-01-26 DIAGNOSIS — S0990XA Unspecified injury of head, initial encounter: Secondary | ICD-10-CM | POA: Diagnosis not present

## 2018-01-26 DIAGNOSIS — R52 Pain, unspecified: Secondary | ICD-10-CM

## 2018-01-26 DIAGNOSIS — M25552 Pain in left hip: Secondary | ICD-10-CM | POA: Insufficient documentation

## 2018-01-26 DIAGNOSIS — Y999 Unspecified external cause status: Secondary | ICD-10-CM | POA: Diagnosis not present

## 2018-01-26 DIAGNOSIS — E46 Unspecified protein-calorie malnutrition: Secondary | ICD-10-CM | POA: Diagnosis not present

## 2018-01-26 DIAGNOSIS — M79605 Pain in left leg: Secondary | ICD-10-CM | POA: Insufficient documentation

## 2018-01-26 DIAGNOSIS — I5032 Chronic diastolic (congestive) heart failure: Secondary | ICD-10-CM | POA: Diagnosis not present

## 2018-01-26 DIAGNOSIS — E119 Type 2 diabetes mellitus without complications: Secondary | ICD-10-CM | POA: Diagnosis not present

## 2018-01-26 DIAGNOSIS — Z87891 Personal history of nicotine dependence: Secondary | ICD-10-CM | POA: Diagnosis not present

## 2018-01-26 DIAGNOSIS — Z7401 Bed confinement status: Secondary | ICD-10-CM | POA: Diagnosis not present

## 2018-01-26 DIAGNOSIS — Z7984 Long term (current) use of oral hypoglycemic drugs: Secondary | ICD-10-CM | POA: Diagnosis not present

## 2018-01-26 DIAGNOSIS — Y939 Activity, unspecified: Secondary | ICD-10-CM | POA: Diagnosis not present

## 2018-01-26 DIAGNOSIS — M549 Dorsalgia, unspecified: Secondary | ICD-10-CM | POA: Insufficient documentation

## 2018-01-26 DIAGNOSIS — M2550 Pain in unspecified joint: Secondary | ICD-10-CM | POA: Diagnosis not present

## 2018-01-26 DIAGNOSIS — I110 Hypertensive heart disease with heart failure: Secondary | ICD-10-CM | POA: Insufficient documentation

## 2018-01-26 DIAGNOSIS — E1159 Type 2 diabetes mellitus with other circulatory complications: Secondary | ICD-10-CM | POA: Diagnosis not present

## 2018-01-26 DIAGNOSIS — I10 Essential (primary) hypertension: Secondary | ICD-10-CM | POA: Diagnosis not present

## 2018-01-26 DIAGNOSIS — I5030 Unspecified diastolic (congestive) heart failure: Secondary | ICD-10-CM | POA: Diagnosis not present

## 2018-01-26 DIAGNOSIS — R51 Headache: Secondary | ICD-10-CM | POA: Diagnosis not present

## 2018-01-26 DIAGNOSIS — R404 Transient alteration of awareness: Secondary | ICD-10-CM | POA: Diagnosis not present

## 2018-01-26 DIAGNOSIS — I4891 Unspecified atrial fibrillation: Secondary | ICD-10-CM | POA: Diagnosis not present

## 2018-01-26 DIAGNOSIS — N183 Chronic kidney disease, stage 3 (moderate): Secondary | ICD-10-CM | POA: Diagnosis not present

## 2018-01-26 DIAGNOSIS — F0151 Vascular dementia with behavioral disturbance: Secondary | ICD-10-CM | POA: Diagnosis not present

## 2018-01-26 MED ORDER — TRAMADOL HCL 50 MG PO TABS
50.0000 mg | ORAL_TABLET | Freq: Once | ORAL | Status: AC
  Administered 2018-01-26: 50 mg via ORAL
  Filled 2018-01-26: qty 1

## 2018-01-26 MED ORDER — TRAMADOL HCL 50 MG PO TABS: 50.0000 mg | ORAL_TABLET | Freq: Four times a day (QID) | ORAL | 0 refills | Status: AC | PRN

## 2018-01-26 MED ORDER — IBUPROFEN 800 MG PO TABS: 800.0000 mg | ORAL_TABLET | Freq: Once | ORAL | Status: DC

## 2018-01-26 NOTE — ED Provider Notes (Addendum)
[facility] DEPT Provider Note   CSN: [id number] Arrival date & time: 01/26/18  4098     History   Chief Complaint Chief Complaint  Patient presents with  . Fall    HPI Cynthia Wright is a 84 y.o. female.  HPI Patient presents to the emergency department with injuries following a fall.  The patient states that she is unsure what happened in the fall.  The report from the nursing home was that they found her on the floor next to her bed.  Patient has complaints of left hip pain and back pain.  Both of those complaints are chronic per her notes. Past Medical History:  Diagnosis Date  . Atrial fibrillation ([place])    s/p DCCV 07/2012  . Benign diastolic hypertension   . Chronic anticoagulation   . Chronic diastolic CHF (congestive heart failure) (HCC)    EF now 65% by echo 10/2012 with grade II diasotlid dysfunction  . Diabetes mellitus without complication ([place])   . Dilated cardiomyopathy secondary to tachycardia ([place]) 07/2012   2D echo 10/2012 showed EF 65% with grade II diastolic dysfunction  . Elevated cholesterol   . Hyperlipidemia   . Hypertension   . Liver cyst   . Memory loss     Patient Active Problem List   Diagnosis Date Noted  . Elevated LFTs 12/22/2016  . HCAP (healthcare-associated pneumonia)   . Dementia with behavioral disturbance ([place])   . Hypokalemia   . Postoperative anemia   . Hyperlipidemia   . E. coli UTI   . Closed displaced fracture of right femoral neck ([place]) 09/28/2016  . Fracture of left leg 09/27/2016  . Fall 07/12/2016  . Leukocytosis 07/12/2016  . Type 2 diabetes mellitus with hyperlipidemia ([place]) 07/12/2016  . Acetabular fracture ([place]) 07/11/2016  . Dilated cardiomyopathy secondary to tachycardia ([place]) 12/14/2013  . Syncope 06/14/2013  . Bradycardia 05/11/2013  . Fatigue 05/11/2013  . Chronic diastolic CHF (congestive heart failure) ([place]) 01/06/2013  . Mitral regurgitation 08/06/2012  . PAF (paroxysmal  atrial fibrillation) ([place]) 08/02/2012  . Benign essential HTN 08/02/2012  . Pure hypercholesterolemia 08/02/2012  . Liver cyst 08/02/2012    Past Surgical History:  Procedure Laterality Date  . ANTERIOR APPROACH HEMI HIP ARTHROPLASTY Right 09/28/2016   Procedure: ANTERIOR APPROACH HEMI HIP ARTHROPLASTY;  Surgeon: Leandrew Koyanagi, MD;  Location: [facility];  Service: Orthopedics;  Laterality: Right;  . APPENDECTOMY    . BACK SURGERY     herniated disc  . CARDIOVERSION N/A 08/09/2012   Procedure: CARDIOVERSION;  Surgeon: Sueanne Margarita, MD;  Location: [facility];  Service: Cardiovascular;  Laterality: N/A;  . CARDIOVERSION N/A 09/28/2014   Procedure: CARDIOVERSION;  Surgeon: Sueanne Margarita, MD;  Location: [facility] ENDOSCOPY;  Service: Cardiovascular;  Laterality: N/A;  . CHOLECYSTECTOMY    . TEE WITHOUT CARDIOVERSION N/A 08/09/2012   Procedure: TRANSESOPHAGEAL ECHOCARDIOGRAM (TEE);  Surgeon: Sueanne Margarita, MD;  Location: [facility] ENDOSCOPY;  Service: Cardiovascular;  Laterality: N/A;  talk to angie in [place].  . TONSILLECTOMY       OB History   None      Home Medications    Prior to Admission medications   Medication Sig Start Date End Date Taking? Authorizing Provider  acetaminophen (TYLENOL) 500 MG tablet Take 500 mg by mouth 2 (two) times daily. Take 500mg  by mouth two times a day for pain   Yes [provider]  amiodarone (PACERONE) 100 MG tablet Take 100 mg by mouth  daily.   Yes [provider]  buPROPion (WELLBUTRIN SR) 150 MG 12 hr tablet Take 150 mg 2 (two) times daily by mouth.   Yes [provider]  busPIRone (BUSPAR) 7.5 MG tablet Take 7.5 mg by mouth 2 (two) times daily. 08/14/17  Yes [provider]  diltiazem (DILACOR XR) 240 MG 24 hr capsule Take 240 mg by mouth daily.   Yes [provider]  divalproex (DEPAKOTE SPRINKLE) 125 MG capsule Take 125 mg by mouth 2 (two) times daily. 10/23/17  Yes [provider]  docusate sodium (COLACE) 100  MG capsule Take 200 mg by mouth at bedtime.    Yes [provider]  ENSURE (ENSURE) Take 237 mLs by mouth 3 (three) times daily after meals.   Yes [provider]  furosemide (LASIX) 40 MG tablet Take 1 tablet (40 mg total) by mouth daily. 12/27/16  Yes Lavina Hamman, MD  iron polysaccharides (NIFEREX) 150 MG capsule Take 1 capsule (150 mg total) by mouth daily. 09/30/16  Yes Barton Dubois, MD  KLOR-CON 20 MEQ packet Take 20 mEq by mouth daily. With food 07/31/17  Yes [provider]  lisinopril (PRINIVIL,ZESTRIL) 10 MG tablet Take 20 mg by mouth daily. 08/17/17  Yes [provider]  LORazepam (ATIVAN) 0.5 MG tablet Take 0.25-0.5 mg by mouth See admin instructions. Takes 0.5 scheduled twice daily. May give additional 0.25 every 6 hours as needed for agitation and anxiety.   Yes [provider]  Melatonin 10 MG TABS Take 10 mg by mouth at bedtime.    Yes [provider]  polyethylene glycol (MIRALAX / GLYCOLAX) packet Take 17 g by mouth daily.   Yes [provider]  prochlorperazine (COMPAZINE) 5 MG tablet Take 5 mg by mouth every 6 (six) hours as needed for nausea or vomiting.   Yes [provider]  rosuvastatin (CRESTOR) 5 MG tablet Take 5 mg by mouth at bedtime.    Yes [provider]  sitaGLIPtin (JANUVIA) 100 MG tablet Take 100 mg by mouth daily.   Yes [provider]  amiodarone (PACERONE) 200 MG tablet Take 1 tablet (200 mg total) by mouth daily. Patient taking differently: Take 100 mg by mouth daily.  10/20/16   Rosita Fire, MD  bacitracin ointment Apply 1 application topically 2 (two) times daily. For 10 days or until healed Patient not taking: Reported on 11/05/2017 10/06/17   Duffy Bruce, MD  cephALEXin (KEFLEX) 500 MG capsule Take 1 capsule (500 mg total) by mouth 3 (three) times daily. 12/15/17   Horton, Barbette Hair, MD  diltiazem (CARDIZEM CD) 240 MG 24 hr capsule Take 1 capsule (240 mg total)  by mouth daily. 12/16/16 11/05/17  Turner, Eber Hong, MD  ELIQUIS 2.5 MG TABS tablet TAKE 1 TABLET (2.5 MG TOTAL) BY MOUTH 2 (TWO) TIMES DAILY. 04/21/16   Sueanne Margarita, MD  ibuprofen (ADVIL,MOTRIN) 800 MG tablet Take 1 tablet (800 mg total) by mouth every 8 (eight) hours as needed. Patient not taking: Reported on 11/05/2017 04/12/17   Dalia Heading, PA-C  potassium chloride SA (KLOR-CON M20) 20 MEQ tablet Take 2 tablets (40 mEq total) by mouth every other day. Patient not taking: Reported on 11/05/2017 05/23/16   Sueanne Margarita, MD  traMADol (ULTRAM) 50 MG tablet Take 1 tablet (50 mg total) by mouth every 6 (six) hours as needed for severe pain. 01/26/18   Dalia Heading, PA-C    Family History Family History  Problem Relation  Age of Onset  . Heart disease Mother   . CAD Mother   . Heart disease Father   . CAD Father   . Pancreatic cancer Brother     Social History Social History   Tobacco Use  . Smoking status: Former Research scientist (life sciences)  . Smokeless tobacco: Never Used  Substance Use Topics  . Alcohol use: Yes    Alcohol/week: 1.0 - 2.0 standard drinks    Types: 1 - 2 Glasses of wine per week  . Drug use: No     Allergies   Codeine; Lortab [hydrocodone-acetaminophen]; and Hydrocodone   Review of Systems Review of Systems Level 5 caveat applies due to dementia  Physical Exam Updated Vital Signs BP (!) 157/87   Pulse 76   Temp 97.9 F (36.6 C) (Oral)   Resp 18   SpO2 98%   Physical Exam  Constitutional: She appears well-developed and well-nourished. No distress.  HENT:  Head: Normocephalic and atraumatic.  Mouth/Throat: Oropharynx is clear and moist.  Eyes: Pupils are equal, round, and reactive to light.  Neck: Normal range of motion. Neck supple.  Cardiovascular: Normal rate, regular rhythm and normal heart sounds. Exam reveals no gallop and no friction rub.  No murmur heard. Pulmonary/Chest: Effort normal and breath sounds normal. No respiratory distress. She has no  wheezes.  Abdominal: Soft. Bowel sounds are normal. She exhibits no distension. There is no tenderness.  Musculoskeletal:       Left hip: She exhibits tenderness. She exhibits normal range of motion, normal strength, no bony tenderness and no swelling.       Lumbar back: She exhibits tenderness. She exhibits no bony tenderness and no swelling.  Neurological: She is alert. She exhibits normal muscle tone. Coordination normal.  Skin: Skin is warm and dry. Capillary refill takes less than 2 seconds. No rash noted. No erythema.  Psychiatric: She has a normal mood and affect. Her behavior is normal.  Nursing note and vitals reviewed.    ED Treatments / Results  Labs (all labs ordered are listed, but only abnormal results are displayed) Labs Reviewed - No data to display  EKG None  Radiology Dg Thoracic Spine 2 View  Result Date: 01/26/2018 CLINICAL DATA:  Fall today. EXAM: THORACIC SPINE 2 VIEWS COMPARISON:  CT chest 11/05/2017, chest two-view 10/06/2017 FINDINGS: Multiple thoracic compression fractures appears similar to prior CT. Upper thoracic spinal fracture at T3 not well identified on the current study. No definite acute fracture. Atherosclerotic aorta. IMPRESSION: Multiple thoracic compression fractures similar to the prior CT. No acute fracture. Electronically Signed   By: Franchot Gallo M.D.   On: 01/26/2018 10:56   Dg Lumbar Spine Complete  Result Date: 01/26/2018 CLINICAL DATA:  Fall today.  Back pain. EXAM: LUMBAR SPINE - COMPLETE 4+ VIEW COMPARISON:  11/05/2017 FINDINGS: Normal alignment.  Negative for fracture.  Generalized osteopenia. Atherosclerotic aorta.  Right hip replacement. IMPRESSION: Negative for fracture lumbar spine. Electronically Signed   By: Franchot Gallo M.D.   On: 01/26/2018 10:58   Ct Head Wo Contrast  Result Date: 01/26/2018 CLINICAL DATA:  Recent fall with neck pain and headaches, initial encounter EXAM: CT HEAD WITHOUT CONTRAST CT CERVICAL SPINE  WITHOUT CONTRAST TECHNIQUE: Multidetector CT imaging of the head and cervical spine was performed following the standard protocol without intravenous contrast. Multiplanar CT image reconstructions of the cervical spine were also generated. COMPARISON:  12/15/2017 FINDINGS: CT HEAD FINDINGS Brain: Mild atrophic changes are noted with evidence of chronic white matter ischemic  change. No findings to suggest acute hemorrhage, acute infarction or space-occupying mass lesion are noted. Vascular: No hyperdense vessel or unexpected calcification. Skull: Normal. Negative for fracture or focal lesion. Sinuses/Orbits: No acute finding. Other: None. CT CERVICAL SPINE FINDINGS Alignment: Within normal limits. Skull base and vertebrae: 7 cervical segments are well visualized. Vertebral body height is well maintained. Disc space narrowing is noted at C5-6 mild osteophytic change. Mild facet hypertrophic changes are seen. Sclerotic focus is again noted in the superior aspect of T1 on the left. No acute fracture is seen. Soft tissues and spinal canal: Stable multinodular goiter is noted. No other soft tissue abnormality is seen. Upper chest: Negative. Other: None IMPRESSION: CT of the head: Mild atrophic and ischemic changes without acute abnormality. CT of the cervical spine: No acute abnormality noted. Stable multinodular goiter. Electronically Signed   By: Inez Catalina M.D.   On: 01/26/2018 11:05   Ct Cervical Spine Wo Contrast  Result Date: 01/26/2018 CLINICAL DATA:  Recent fall with neck pain and headaches, initial encounter EXAM: CT HEAD WITHOUT CONTRAST CT CERVICAL SPINE WITHOUT CONTRAST TECHNIQUE: Multidetector CT imaging of the head and cervical spine was performed following the standard protocol without intravenous contrast. Multiplanar CT image reconstructions of the cervical spine were also generated. COMPARISON:  12/15/2017 FINDINGS: CT HEAD FINDINGS Brain: Mild atrophic changes are noted with evidence of chronic  white matter ischemic change. No findings to suggest acute hemorrhage, acute infarction or space-occupying mass lesion are noted. Vascular: No hyperdense vessel or unexpected calcification. Skull: Normal. Negative for fracture or focal lesion. Sinuses/Orbits: No acute finding. Other: None. CT CERVICAL SPINE FINDINGS Alignment: Within normal limits. Skull base and vertebrae: 7 cervical segments are well visualized. Vertebral body height is well maintained. Disc space narrowing is noted at C5-6 mild osteophytic change. Mild facet hypertrophic changes are seen. Sclerotic focus is again noted in the superior aspect of T1 on the left. No acute fracture is seen. Soft tissues and spinal canal: Stable multinodular goiter is noted. No other soft tissue abnormality is seen. Upper chest: Negative. Other: None IMPRESSION: CT of the head: Mild atrophic and ischemic changes without acute abnormality. CT of the cervical spine: No acute abnormality noted. Stable multinodular goiter. Electronically Signed   By: Inez Catalina M.D.   On: 01/26/2018 11:05   Dg Hip Unilat With Pelvis 2-3 Views Right  Result Date: 01/26/2018 CLINICAL DATA:  Fall today EXAM: DG HIP (WITH OR WITHOUT PELVIS) 2-3V RIGHT COMPARISON:  04/12/2017 FINDINGS: Right hip replacement in satisfactory position. Healed chronic fractures of the right pelvis. Extensive heterotopic ossification lateral to the right hip unchanged. No acute fracture. IMPRESSION: No acute fracture and no change from the prior study. Electronically Signed   By: Franchot Gallo M.D.   On: 01/26/2018 10:53    Procedures Procedures (including critical care time)  Medications Ordered in ED Medications  traMADol (ULTRAM) tablet 50 mg (has no administration in time range)     Initial Impression / Assessment and Plan / ED Course  I have reviewed the triage vital signs and the nursing notes.  Pertinent labs & imaging results that were available during my care of the patient were  reviewed by me and considered in my medical decision making (see chart for details).     Patient will be discharged back to her facility.  The patient was able to ambulate without significant difficulty.  Patient has chronic pain in her back and legs.  Patient is advised to return here  as needed told to follow-up with her primary doctor.  Final Clinical Impressions(s) / ED Diagnoses   Final diagnoses:  Fall, initial encounter  Minor head injury, initial encounter    ED Discharge Orders         Ordered    traMADol (ULTRAM) 50 MG tablet  Every 6 hours PRN     01/26/18 1237           Dalia Heading, PA-C 01/26/18 1246    Dorie Rank, MD 01/26/18 1541    Josip Merolla, [place], PA-C 02/24/18 1642    Dorie Rank, MD 02/24/18 1859

## 2018-01-26 NOTE — ED Notes (Signed)
Informed [facility] that patient needs transportation back to [facility].

## 2018-01-26 NOTE — Progress Notes (Signed)
[facility]) ED Visit  Contacted by [facility] RN, stated pt was being transferred post fall to [facility] ED for evaluation.  Met with pt, she is alert but confused per baseline.  Alert and oriented x1.  Did not believe that she had fallen, and has not recollection of the events.  She appeared calm and in no distress.  She did state she was hurting when asked, and that she was thirsty. She had c-collar on that she wanted to be removed.  Reiterated that the ED staff would remove when appropriate.    Updated IDT and family  Please call with any hospice related questions or concerns Venia Carbon BSN, RN [facility] Extended [facility] Liaison (listed in [facility]) [phone]

## 2018-01-26 NOTE — ED Notes (Signed)
C-Collar removed, PA Chris aware.

## 2018-01-26 NOTE — ED Notes (Signed)
Pt able to ambulate appx 10 steps with minimal assist and walker.  Pt able to bear weight on both legs, complains of left leg pain.

## 2018-01-26 NOTE — Discharge Instructions (Addendum)
Return here as needed. Follow up with her doctor. °

## 2018-01-26 NOTE — ED Notes (Signed)
Bed: NP54 Expected date:  Expected time:  Means of arrival:  Comments: EMS 84yo unwitnessed fall

## 2018-01-26 NOTE — ED Notes (Signed)
Family at bedside. 

## 2018-01-26 NOTE — ED Triage Notes (Addendum)
Pt BIB EMS from SNF unwitnessed fall, found bedside her bed.  Staff reported some furniture across the room from where she fell was disheveled. Pt is on hospice.  Staff at facility contacted hospice, who said pt needed to come to ED.  Pt complaining of left hip pain, lower back pain.  Some bruising to left inner orbital. Pt stated she may have "passed out." Pt has hx of dementia.

## 2018-01-27 DIAGNOSIS — F0151 Vascular dementia with behavioral disturbance: Secondary | ICD-10-CM | POA: Diagnosis not present

## 2018-01-27 DIAGNOSIS — E46 Unspecified protein-calorie malnutrition: Secondary | ICD-10-CM | POA: Diagnosis not present

## 2018-01-27 DIAGNOSIS — N183 Chronic kidney disease, stage 3 (moderate): Secondary | ICD-10-CM | POA: Diagnosis not present

## 2018-01-27 DIAGNOSIS — I5030 Unspecified diastolic (congestive) heart failure: Secondary | ICD-10-CM | POA: Diagnosis not present

## 2018-01-27 DIAGNOSIS — E1159 Type 2 diabetes mellitus with other circulatory complications: Secondary | ICD-10-CM | POA: Diagnosis not present

## 2018-01-27 DIAGNOSIS — I4891 Unspecified atrial fibrillation: Secondary | ICD-10-CM | POA: Diagnosis not present

## 2018-01-28 DIAGNOSIS — E46 Unspecified protein-calorie malnutrition: Secondary | ICD-10-CM | POA: Diagnosis not present

## 2018-01-28 DIAGNOSIS — F0151 Vascular dementia with behavioral disturbance: Secondary | ICD-10-CM | POA: Diagnosis not present

## 2018-01-28 DIAGNOSIS — N183 Chronic kidney disease, stage 3 (moderate): Secondary | ICD-10-CM | POA: Diagnosis not present

## 2018-01-28 DIAGNOSIS — E1159 Type 2 diabetes mellitus with other circulatory complications: Secondary | ICD-10-CM | POA: Diagnosis not present

## 2018-01-28 DIAGNOSIS — I5030 Unspecified diastolic (congestive) heart failure: Secondary | ICD-10-CM | POA: Diagnosis not present

## 2018-01-28 DIAGNOSIS — I4891 Unspecified atrial fibrillation: Secondary | ICD-10-CM | POA: Diagnosis not present

## 2018-01-29 DIAGNOSIS — N183 Chronic kidney disease, stage 3 (moderate): Secondary | ICD-10-CM | POA: Diagnosis not present

## 2018-01-29 DIAGNOSIS — E785 Hyperlipidemia, unspecified: Secondary | ICD-10-CM | POA: Diagnosis not present

## 2018-01-29 DIAGNOSIS — I4891 Unspecified atrial fibrillation: Secondary | ICD-10-CM | POA: Diagnosis not present

## 2018-01-29 DIAGNOSIS — F0151 Vascular dementia with behavioral disturbance: Secondary | ICD-10-CM | POA: Diagnosis not present

## 2018-01-29 DIAGNOSIS — I5030 Unspecified diastolic (congestive) heart failure: Secondary | ICD-10-CM | POA: Diagnosis not present

## 2018-01-29 DIAGNOSIS — E46 Unspecified protein-calorie malnutrition: Secondary | ICD-10-CM | POA: Diagnosis not present

## 2018-01-29 DIAGNOSIS — F339 Major depressive disorder, recurrent, unspecified: Secondary | ICD-10-CM | POA: Diagnosis not present

## 2018-01-29 DIAGNOSIS — E1159 Type 2 diabetes mellitus with other circulatory complications: Secondary | ICD-10-CM | POA: Diagnosis not present

## 2018-02-04 DIAGNOSIS — I739 Peripheral vascular disease, unspecified: Secondary | ICD-10-CM | POA: Diagnosis not present

## 2018-02-04 DIAGNOSIS — B351 Tinea unguium: Secondary | ICD-10-CM | POA: Diagnosis not present

## 2018-02-04 DIAGNOSIS — L603 Nail dystrophy: Secondary | ICD-10-CM | POA: Diagnosis not present

## 2018-02-04 DIAGNOSIS — E1159 Type 2 diabetes mellitus with other circulatory complications: Secondary | ICD-10-CM | POA: Diagnosis not present

## 2018-02-04 DIAGNOSIS — Q845 Enlarged and hypertrophic nails: Secondary | ICD-10-CM | POA: Diagnosis not present

## 2018-02-05 DIAGNOSIS — M79669 Pain in unspecified lower leg: Secondary | ICD-10-CM | POA: Diagnosis not present

## 2018-02-05 DIAGNOSIS — G894 Chronic pain syndrome: Secondary | ICD-10-CM | POA: Diagnosis not present

## 2018-02-09 ENCOUNTER — Emergency Department (HOSPITAL_COMMUNITY)
Admission: EM | Admit: 2018-02-09 | Discharge: 2018-02-10 | Disposition: A | Discharge status: Home / Self Care | Attending: Emergency Medicine | Admitting: Emergency Medicine

## 2018-02-09 ENCOUNTER — Encounter (HOSPITAL_COMMUNITY): Payer: Self-pay | Admitting: Internal Medicine

## 2018-02-09 DIAGNOSIS — W19XXXA Unspecified fall, initial encounter: Secondary | ICD-10-CM

## 2018-02-09 DIAGNOSIS — F0390 Unspecified dementia without behavioral disturbance: Secondary | ICD-10-CM | POA: Insufficient documentation

## 2018-02-09 DIAGNOSIS — Y939 Activity, unspecified: Secondary | ICD-10-CM | POA: Diagnosis not present

## 2018-02-09 DIAGNOSIS — T1490XA Injury, unspecified, initial encounter: Secondary | ICD-10-CM | POA: Diagnosis present

## 2018-02-09 DIAGNOSIS — Z87891 Personal history of nicotine dependence: Secondary | ICD-10-CM | POA: Diagnosis not present

## 2018-02-09 DIAGNOSIS — I110 Hypertensive heart disease with heart failure: Secondary | ICD-10-CM | POA: Diagnosis not present

## 2018-02-09 DIAGNOSIS — W06XXXA Fall from bed, initial encounter: Secondary | ICD-10-CM | POA: Diagnosis not present

## 2018-02-09 DIAGNOSIS — E119 Type 2 diabetes mellitus without complications: Secondary | ICD-10-CM | POA: Insufficient documentation

## 2018-02-09 DIAGNOSIS — Y929 Unspecified place or not applicable: Secondary | ICD-10-CM | POA: Diagnosis not present

## 2018-02-09 DIAGNOSIS — I5032 Chronic diastolic (congestive) heart failure: Secondary | ICD-10-CM | POA: Diagnosis not present

## 2018-02-09 DIAGNOSIS — Y999 Unspecified external cause status: Secondary | ICD-10-CM | POA: Insufficient documentation

## 2018-02-09 DIAGNOSIS — Z79899 Other long term (current) drug therapy: Secondary | ICD-10-CM | POA: Diagnosis not present

## 2018-02-09 DIAGNOSIS — R531 Weakness: Secondary | ICD-10-CM | POA: Diagnosis not present

## 2018-02-09 DIAGNOSIS — Z7401 Bed confinement status: Secondary | ICD-10-CM | POA: Diagnosis not present

## 2018-02-09 DIAGNOSIS — M2550 Pain in unspecified joint: Secondary | ICD-10-CM | POA: Diagnosis not present

## 2018-02-09 DIAGNOSIS — I4891 Unspecified atrial fibrillation: Secondary | ICD-10-CM | POA: Diagnosis not present

## 2018-02-09 DIAGNOSIS — R410 Disorientation, unspecified: Secondary | ICD-10-CM | POA: Diagnosis not present

## 2018-02-09 NOTE — ED Notes (Signed)
[facility] called to transport pt home 

## 2018-02-09 NOTE — ED Triage Notes (Signed)
Pt had unwitnessed mechanical fall at [facility] memory care. She was found next to her bed on a hardwood floor. No tenderness noted. No crepitus noted by EMS. No blood thinners. Hx dementia. Alert and conscious the entire ride for EMS. Staff at facility poor historians. Recently placed in hospice approx 2 weeks ago.

## 2018-02-09 NOTE — ED Provider Notes (Signed)
[facility] EMERGENCY DEPARTMENT Provider Note   CSN: [id number] Arrival date & time: 02/09/18  [place]     History   Chief Complaint Chief Complaint  Patient presents with  . Fall   Level 5 caveat: Dementia   HPI Cynthia Wright is a 84 y.o. female.  HPI Patient is a 84 year old female presents the emergency department after being found next to her bed on the hardwood floor this afternoon.  Sent to the emergency department per protocol.  History of chronic anticoagulation and history of dementia.  She is in the memory care unit.  She does not remember the fall.  She moves her arms and legs at this time.  There is no obvious trauma to her head.  She has no complaints.  Facility reports baseline mental status.   Past Medical History:  Diagnosis Date  . Atrial fibrillation ([place])    s/p DCCV 07/2012  . Benign diastolic hypertension   . Chronic anticoagulation   . Chronic diastolic CHF (congestive heart failure) (HCC)    EF now 65% by echo 10/2012 with grade II diasotlid dysfunction  . Diabetes mellitus without complication ([place])   . Dilated cardiomyopathy secondary to tachycardia ([place]) 07/2012   2D echo 10/2012 showed EF 65% with grade II diastolic dysfunction  . Elevated cholesterol   . Hyperlipidemia   . Hypertension   . Liver cyst   . Memory loss     Patient Active Problem List   Diagnosis Date Noted  . Elevated LFTs 12/22/2016  . HCAP (healthcare-associated pneumonia)   . Dementia with behavioral disturbance ([place])   . Hypokalemia   . Postoperative anemia   . Hyperlipidemia   . E. coli UTI   . Closed displaced fracture of right femoral neck ([place]) 09/28/2016  . Fracture of left leg 09/27/2016  . Fall 07/12/2016  . Leukocytosis 07/12/2016  . Type 2 diabetes mellitus with hyperlipidemia ([place]) 07/12/2016  . Acetabular fracture ([place]) 07/11/2016  . Dilated cardiomyopathy secondary to tachycardia ([place]) 12/14/2013  . Syncope 06/14/2013  . Bradycardia  05/11/2013  . Fatigue 05/11/2013  . Chronic diastolic CHF (congestive heart failure) ([place]) 01/06/2013  . Mitral regurgitation 08/06/2012  . PAF (paroxysmal atrial fibrillation) ([place]) 08/02/2012  . Benign essential HTN 08/02/2012  . Pure hypercholesterolemia 08/02/2012  . Liver cyst 08/02/2012    Past Surgical History:  Procedure Laterality Date  . ANTERIOR APPROACH HEMI HIP ARTHROPLASTY Right 09/28/2016   Procedure: ANTERIOR APPROACH HEMI HIP ARTHROPLASTY;  Surgeon: Leandrew Koyanagi, MD;  Location: [facility];  Service: Orthopedics;  Laterality: Right;  . APPENDECTOMY    . BACK SURGERY     herniated disc  . CARDIOVERSION N/A 08/09/2012   Procedure: CARDIOVERSION;  Surgeon: Sueanne Margarita, MD;  Location: [facility];  Service: Cardiovascular;  Laterality: N/A;  . CARDIOVERSION N/A 09/28/2014   Procedure: CARDIOVERSION;  Surgeon: Sueanne Margarita, MD;  Location: [facility] ENDOSCOPY;  Service: Cardiovascular;  Laterality: N/A;  . CHOLECYSTECTOMY    . TEE WITHOUT CARDIOVERSION N/A 08/09/2012   Procedure: TRANSESOPHAGEAL ECHOCARDIOGRAM (TEE);  Surgeon: Sueanne Margarita, MD;  [facility]COPY;  Service: Cardiovascular;  Laterality: N/A;  talk to angie in [place].  . TONSILLECTOMY       OB History   None      Home Medications    Prior to Admission medications   Medication Sig Start Date End Date Taking? Authorizing Provider  acetaminophen (TYLENOL) 500 MG tablet Take 500 mg by mouth 2 (two) times  daily. Take 500mg  by mouth two times a day for pain    [provider]  amiodarone (PACERONE) 100 MG tablet Take 100 mg by mouth daily.    [provider]  amiodarone (PACERONE) 200 MG tablet Take 1 tablet (200 mg total) by mouth daily. Patient taking differently: Take 100 mg by mouth daily.  10/20/16   Rosita Fire, MD  bacitracin ointment Apply 1 application topically 2 (two) times daily. For 10 days or until healed Patient not taking: Reported on 11/05/2017 10/06/17   Duffy Bruce,  MD  buPROPion [facility] SR) 150 MG 12 hr tablet Take 150 mg 2 (two) times daily by mouth.    [provider]  busPIRone (BUSPAR) 7.5 MG tablet Take 7.5 mg by mouth 2 (two) times daily. 08/14/17   [provider]  cephALEXin (KEFLEX) 500 MG capsule Take 1 capsule (500 mg total) by mouth 3 (three) times daily. Patient not taking: Reported on 01/26/2018 12/15/17   Horton, Barbette Hair, MD  diltiazem (CARDIZEM CD) 240 MG 24 hr capsule Take 1 capsule (240 mg total) by mouth daily. Patient not taking: Reported on 01/26/2018 12/16/16 11/05/17  Sueanne Margarita, MD  diltiazem (DILACOR XR) 240 MG 24 hr capsule Take 240 mg by mouth daily.    [provider]  divalproex (DEPAKOTE SPRINKLE) 125 MG capsule Take 125 mg by mouth 2 (two) times daily. 10/23/17   [provider]  docusate sodium (COLACE) 100 MG capsule Take 200 mg by mouth at bedtime.     [provider]  ELIQUIS 2.5 MG TABS tablet TAKE 1 TABLET (2.5 MG TOTAL) BY MOUTH 2 (TWO) TIMES DAILY. Patient not taking: Reported on 01/26/2018 04/21/16   Sueanne Margarita, MD  ENSURE (ENSURE) Take 237 mLs by mouth 3 (three) times daily after meals.    [provider]  furosemide (LASIX) 40 MG tablet Take 1 tablet (40 mg total) by mouth daily. 12/27/16   Lavina Hamman, MD  ibuprofen (ADVIL,MOTRIN) 800 MG tablet Take 1 tablet (800 mg total) by mouth every 8 (eight) hours as needed. Patient not taking: Reported on 11/05/2017 04/12/17   Dalia Heading, PA-C  iron polysaccharides (NIFEREX) 150 MG capsule Take 1 capsule (150 mg total) by mouth daily. 09/30/16   Barton Dubois, MD  KLOR-CON 20 MEQ packet Take 20 mEq by mouth daily. With food 07/31/17   [provider]  lisinopril (PRINIVIL,ZESTRIL) 10 MG tablet Take 20 mg by mouth daily. 08/17/17   [provider]  LORazepam (ATIVAN) 0.5 MG tablet Take 0.25-0.5 mg by mouth See admin instructions. Takes 0.5 scheduled twice daily. May give additional 0.25  every 6 hours as needed for agitation and anxiety.    [provider]  Melatonin 10 MG TABS Take 10 mg by mouth at bedtime.     [provider]  polyethylene glycol (MIRALAX / GLYCOLAX) packet Take 17 g by mouth daily.    [provider]  potassium chloride SA (KLOR-CON M20) 20 MEQ tablet Take 2 tablets (40 mEq total) by mouth every other day. Patient not taking: Reported on 11/05/2017 05/23/16   Sueanne Margarita, MD  prochlorperazine (COMPAZINE) 5 MG tablet Take 5 mg by mouth every 6 (six) hours as needed for nausea or vomiting.    [provider]  rosuvastatin (CRESTOR) 5 MG tablet Take 5 mg by mouth at bedtime.     [provider]  sitaGLIPtin (JANUVIA) 100 MG tablet Take 100 mg by mouth  daily.    [provider]  traMADol (ULTRAM) 50 MG tablet Take 1 tablet (50 mg total) by mouth every 6 (six) hours as needed for severe pain. 01/26/18   Dalia Heading, PA-C    Family History Family History  Problem Relation Age of Onset  . Heart disease Mother   . CAD Mother   . Heart disease Father   . CAD Father   . Pancreatic cancer Brother     Social History Social History   Tobacco Use  . Smoking status: Former Research scientist (life sciences)  . Smokeless tobacco: Never Used  Substance Use Topics  . Alcohol use: Yes    Alcohol/week: 1.0 - 2.0 standard drinks    Types: 1 - 2 Glasses of wine per week  . Drug use: No     Allergies   Codeine; Lortab [hydrocodone-acetaminophen]; and Hydrocodone   Review of Systems Review of Systems  Unable to perform ROS: Dementia     Physical Exam Updated Vital Signs BP (!) 177/73 (BP Location: Right Arm)   Pulse 75   Temp 98.1 F (36.7 C)   Resp 14   SpO2 99%   Physical Exam  Constitutional: She appears well-developed and well-nourished. No distress.  HENT:  Head: Normocephalic and atraumatic.  Eyes: EOM are normal.  Neck: Normal range of motion. Neck supple.  c spine nontender.  Cardiovascular: Normal  rate.  Pulmonary/Chest: Effort normal and breath sounds normal.  Abdominal: Soft. She exhibits no distension. There is no tenderness.  Musculoskeletal:  Full range of motion of bilateral shoulders, elbows, wrist.  Full range of motion of bilateral hips, knees, ankles.  No obvious deformity of her upper or lower extremities.  5 out of 5 strength in her bilateral upper and lower extremity major muscle groups.  Neurological: She is alert.  Oriented to person and place but not time  Skin: Skin is warm and dry.  Psychiatric: She has a normal mood and affect.  Nursing note and vitals reviewed.    ED Treatments / Results  Labs (all labs ordered are listed, but only abnormal results are displayed) Labs Reviewed - No data to display  EKG None  Radiology No results found.  Procedures Procedures (including critical care time)  Medications Ordered in ED Medications - No data to display   Initial Impression / Assessment and Plan / ED Course  I have reviewed the triage vital signs and the nursing notes.  Pertinent labs & imaging results that were available during my care of the patient were reviewed by me and considered in my medical decision making (see chart for details).     Full range of motion of major joints.  No obvious head trauma.  I discussed the case with her son and healthcare power of attorney who agrees with minimal work-up in the emergency department.  I do not think she needs imaging of any major joint or long bone.  I do not believe she needs CT imaging of her head.  She is a patient of [facility] of [facility] she is 40 with advanced dementia.  She has no complaints at this time.  Final Clinical Impressions(s) / ED Diagnoses   Final diagnoses:  Fall, initial encounter    ED Discharge Orders    None       Jola Schmidt, MD 02/09/18 2013

## 2018-02-09 NOTE — ED Notes (Signed)
ED Provider at bedside. 

## 2018-02-11 ENCOUNTER — Other Ambulatory Visit: Payer: Self-pay | Admitting: *Deleted

## 2018-02-11 NOTE — Patient Outreach (Signed)
[facility]) Care Management  02/11/2018  Cynthia Wright 1924/04/21 [id number]   Telephone Screen  Referral Date:  02/11/2018 Referral Source:  [facility] ED Census Reason for Referral:  6 or more ED visits in the past 6 months Insurance:  Medicare   Outreach Attempt:  Received referral for High ED Utilization.  Per chart review patient resident of [facility].  RN Health Coach outreach to [facility] and [place] and spoke with [name] whom confirms patient has been in Hospice since 11/06/2017.  Confirms patient also resident of [facility].  Plan:  RN Health Coach will close case based on patient enrolled in an external program.   [name] RN [place] [phone] [name]

## 2018-02-19 DIAGNOSIS — G894 Chronic pain syndrome: Secondary | ICD-10-CM | POA: Diagnosis not present

## 2018-02-19 DIAGNOSIS — R296 Repeated falls: Secondary | ICD-10-CM | POA: Diagnosis not present

## 2018-02-19 DIAGNOSIS — K5900 Constipation, unspecified: Secondary | ICD-10-CM | POA: Diagnosis not present

## 2018-02-19 DIAGNOSIS — R634 Abnormal weight loss: Secondary | ICD-10-CM | POA: Diagnosis not present

## 2018-02-19 DIAGNOSIS — R32 Unspecified urinary incontinence: Secondary | ICD-10-CM | POA: Diagnosis not present

## 2018-02-19 DIAGNOSIS — E46 Unspecified protein-calorie malnutrition: Secondary | ICD-10-CM | POA: Diagnosis not present

## 2018-02-19 DIAGNOSIS — I5030 Unspecified diastolic (congestive) heart failure: Secondary | ICD-10-CM | POA: Diagnosis not present

## 2018-02-19 DIAGNOSIS — R2681 Unsteadiness on feet: Secondary | ICD-10-CM | POA: Diagnosis not present

## 2018-02-19 DIAGNOSIS — E119 Type 2 diabetes mellitus without complications: Secondary | ICD-10-CM | POA: Diagnosis not present

## 2018-02-19 DIAGNOSIS — I482 Chronic atrial fibrillation, unspecified: Secondary | ICD-10-CM | POA: Diagnosis not present

## 2018-02-19 DIAGNOSIS — I10 Essential (primary) hypertension: Secondary | ICD-10-CM | POA: Diagnosis not present

## 2018-02-19 DIAGNOSIS — R627 Adult failure to thrive: Secondary | ICD-10-CM | POA: Diagnosis not present

## 2018-02-23 DIAGNOSIS — F419 Anxiety disorder, unspecified: Secondary | ICD-10-CM | POA: Diagnosis not present

## 2018-02-23 DIAGNOSIS — F331 Major depressive disorder, recurrent, moderate: Secondary | ICD-10-CM | POA: Diagnosis not present

## 2018-02-28 DIAGNOSIS — E1159 Type 2 diabetes mellitus with other circulatory complications: Secondary | ICD-10-CM | POA: Diagnosis not present

## 2018-02-28 DIAGNOSIS — F339 Major depressive disorder, recurrent, unspecified: Secondary | ICD-10-CM | POA: Diagnosis not present

## 2018-02-28 DIAGNOSIS — E785 Hyperlipidemia, unspecified: Secondary | ICD-10-CM | POA: Diagnosis not present

## 2018-02-28 DIAGNOSIS — E46 Unspecified protein-calorie malnutrition: Secondary | ICD-10-CM | POA: Diagnosis not present

## 2018-02-28 DIAGNOSIS — I4891 Unspecified atrial fibrillation: Secondary | ICD-10-CM | POA: Diagnosis not present

## 2018-02-28 DIAGNOSIS — N183 Chronic kidney disease, stage 3 (moderate): Secondary | ICD-10-CM | POA: Diagnosis not present

## 2018-02-28 DIAGNOSIS — F0151 Vascular dementia with behavioral disturbance: Secondary | ICD-10-CM | POA: Diagnosis not present

## 2018-02-28 DIAGNOSIS — I5030 Unspecified diastolic (congestive) heart failure: Secondary | ICD-10-CM | POA: Diagnosis not present

## 2018-03-01 DIAGNOSIS — E46 Unspecified protein-calorie malnutrition: Secondary | ICD-10-CM | POA: Diagnosis not present

## 2018-03-01 DIAGNOSIS — E1159 Type 2 diabetes mellitus with other circulatory complications: Secondary | ICD-10-CM | POA: Diagnosis not present

## 2018-03-01 DIAGNOSIS — I4891 Unspecified atrial fibrillation: Secondary | ICD-10-CM | POA: Diagnosis not present

## 2018-03-01 DIAGNOSIS — N183 Chronic kidney disease, stage 3 (moderate): Secondary | ICD-10-CM | POA: Diagnosis not present

## 2018-03-01 DIAGNOSIS — I5030 Unspecified diastolic (congestive) heart failure: Secondary | ICD-10-CM | POA: Diagnosis not present

## 2018-03-01 DIAGNOSIS — F0151 Vascular dementia with behavioral disturbance: Secondary | ICD-10-CM | POA: Diagnosis not present

## 2018-03-02 DIAGNOSIS — I4891 Unspecified atrial fibrillation: Secondary | ICD-10-CM | POA: Diagnosis not present

## 2018-03-02 DIAGNOSIS — N183 Chronic kidney disease, stage 3 (moderate): Secondary | ICD-10-CM | POA: Diagnosis not present

## 2018-03-02 DIAGNOSIS — I5030 Unspecified diastolic (congestive) heart failure: Secondary | ICD-10-CM | POA: Diagnosis not present

## 2018-03-02 DIAGNOSIS — E46 Unspecified protein-calorie malnutrition: Secondary | ICD-10-CM | POA: Diagnosis not present

## 2018-03-02 DIAGNOSIS — F0151 Vascular dementia with behavioral disturbance: Secondary | ICD-10-CM | POA: Diagnosis not present

## 2018-03-02 DIAGNOSIS — E1159 Type 2 diabetes mellitus with other circulatory complications: Secondary | ICD-10-CM | POA: Diagnosis not present

## 2018-03-04 DIAGNOSIS — N183 Chronic kidney disease, stage 3 (moderate): Secondary | ICD-10-CM | POA: Diagnosis not present

## 2018-03-04 DIAGNOSIS — I4891 Unspecified atrial fibrillation: Secondary | ICD-10-CM | POA: Diagnosis not present

## 2018-03-04 DIAGNOSIS — F0151 Vascular dementia with behavioral disturbance: Secondary | ICD-10-CM | POA: Diagnosis not present

## 2018-03-04 DIAGNOSIS — I5030 Unspecified diastolic (congestive) heart failure: Secondary | ICD-10-CM | POA: Diagnosis not present

## 2018-03-04 DIAGNOSIS — E1159 Type 2 diabetes mellitus with other circulatory complications: Secondary | ICD-10-CM | POA: Diagnosis not present

## 2018-03-04 DIAGNOSIS — E46 Unspecified protein-calorie malnutrition: Secondary | ICD-10-CM | POA: Diagnosis not present

## 2018-03-05 DIAGNOSIS — G894 Chronic pain syndrome: Secondary | ICD-10-CM | POA: Diagnosis not present

## 2018-03-05 DIAGNOSIS — M79669 Pain in unspecified lower leg: Secondary | ICD-10-CM | POA: Diagnosis not present

## 2018-03-09 DIAGNOSIS — I4891 Unspecified atrial fibrillation: Secondary | ICD-10-CM | POA: Diagnosis not present

## 2018-03-09 DIAGNOSIS — E1159 Type 2 diabetes mellitus with other circulatory complications: Secondary | ICD-10-CM | POA: Diagnosis not present

## 2018-03-09 DIAGNOSIS — I5030 Unspecified diastolic (congestive) heart failure: Secondary | ICD-10-CM | POA: Diagnosis not present

## 2018-03-09 DIAGNOSIS — N183 Chronic kidney disease, stage 3 (moderate): Secondary | ICD-10-CM | POA: Diagnosis not present

## 2018-03-09 DIAGNOSIS — E46 Unspecified protein-calorie malnutrition: Secondary | ICD-10-CM | POA: Diagnosis not present

## 2018-03-09 DIAGNOSIS — F0151 Vascular dementia with behavioral disturbance: Secondary | ICD-10-CM | POA: Diagnosis not present

## 2018-03-10 DIAGNOSIS — I5030 Unspecified diastolic (congestive) heart failure: Secondary | ICD-10-CM | POA: Diagnosis not present

## 2018-03-10 DIAGNOSIS — E1159 Type 2 diabetes mellitus with other circulatory complications: Secondary | ICD-10-CM | POA: Diagnosis not present

## 2018-03-10 DIAGNOSIS — E46 Unspecified protein-calorie malnutrition: Secondary | ICD-10-CM | POA: Diagnosis not present

## 2018-03-10 DIAGNOSIS — I4891 Unspecified atrial fibrillation: Secondary | ICD-10-CM | POA: Diagnosis not present

## 2018-03-10 DIAGNOSIS — N183 Chronic kidney disease, stage 3 (moderate): Secondary | ICD-10-CM | POA: Diagnosis not present

## 2018-03-10 DIAGNOSIS — F0151 Vascular dementia with behavioral disturbance: Secondary | ICD-10-CM | POA: Diagnosis not present

## 2018-03-11 DIAGNOSIS — N183 Chronic kidney disease, stage 3 (moderate): Secondary | ICD-10-CM | POA: Diagnosis not present

## 2018-03-11 DIAGNOSIS — E46 Unspecified protein-calorie malnutrition: Secondary | ICD-10-CM | POA: Diagnosis not present

## 2018-03-11 DIAGNOSIS — E1159 Type 2 diabetes mellitus with other circulatory complications: Secondary | ICD-10-CM | POA: Diagnosis not present

## 2018-03-11 DIAGNOSIS — I4891 Unspecified atrial fibrillation: Secondary | ICD-10-CM | POA: Diagnosis not present

## 2018-03-11 DIAGNOSIS — I5030 Unspecified diastolic (congestive) heart failure: Secondary | ICD-10-CM | POA: Diagnosis not present

## 2018-03-11 DIAGNOSIS — F0151 Vascular dementia with behavioral disturbance: Secondary | ICD-10-CM | POA: Diagnosis not present

## 2018-03-12 DIAGNOSIS — F0151 Vascular dementia with behavioral disturbance: Secondary | ICD-10-CM | POA: Diagnosis not present

## 2018-03-12 DIAGNOSIS — I482 Chronic atrial fibrillation, unspecified: Secondary | ICD-10-CM | POA: Diagnosis not present

## 2018-03-12 DIAGNOSIS — M25551 Pain in right hip: Secondary | ICD-10-CM | POA: Diagnosis not present

## 2018-03-12 DIAGNOSIS — E119 Type 2 diabetes mellitus without complications: Secondary | ICD-10-CM | POA: Diagnosis not present

## 2018-03-12 DIAGNOSIS — R634 Abnormal weight loss: Secondary | ICD-10-CM | POA: Diagnosis not present

## 2018-03-12 DIAGNOSIS — R32 Unspecified urinary incontinence: Secondary | ICD-10-CM | POA: Diagnosis not present

## 2018-03-12 DIAGNOSIS — E46 Unspecified protein-calorie malnutrition: Secondary | ICD-10-CM | POA: Diagnosis not present

## 2018-03-12 DIAGNOSIS — R2681 Unsteadiness on feet: Secondary | ICD-10-CM | POA: Diagnosis not present

## 2018-03-12 DIAGNOSIS — K5900 Constipation, unspecified: Secondary | ICD-10-CM | POA: Diagnosis not present

## 2018-03-12 DIAGNOSIS — I5030 Unspecified diastolic (congestive) heart failure: Secondary | ICD-10-CM | POA: Diagnosis not present

## 2018-03-12 DIAGNOSIS — N183 Chronic kidney disease, stage 3 (moderate): Secondary | ICD-10-CM | POA: Diagnosis not present

## 2018-03-12 DIAGNOSIS — R296 Repeated falls: Secondary | ICD-10-CM | POA: Diagnosis not present

## 2018-03-12 DIAGNOSIS — G894 Chronic pain syndrome: Secondary | ICD-10-CM | POA: Diagnosis not present

## 2018-03-12 DIAGNOSIS — E1159 Type 2 diabetes mellitus with other circulatory complications: Secondary | ICD-10-CM | POA: Diagnosis not present

## 2018-03-12 DIAGNOSIS — R627 Adult failure to thrive: Secondary | ICD-10-CM | POA: Diagnosis not present

## 2018-03-12 DIAGNOSIS — I10 Essential (primary) hypertension: Secondary | ICD-10-CM | POA: Diagnosis not present

## 2018-03-12 DIAGNOSIS — I4891 Unspecified atrial fibrillation: Secondary | ICD-10-CM | POA: Diagnosis not present

## 2018-03-16 DIAGNOSIS — F0151 Vascular dementia with behavioral disturbance: Secondary | ICD-10-CM | POA: Diagnosis not present

## 2018-03-16 DIAGNOSIS — E46 Unspecified protein-calorie malnutrition: Secondary | ICD-10-CM | POA: Diagnosis not present

## 2018-03-16 DIAGNOSIS — I5030 Unspecified diastolic (congestive) heart failure: Secondary | ICD-10-CM | POA: Diagnosis not present

## 2018-03-16 DIAGNOSIS — N183 Chronic kidney disease, stage 3 (moderate): Secondary | ICD-10-CM | POA: Diagnosis not present

## 2018-03-16 DIAGNOSIS — I4891 Unspecified atrial fibrillation: Secondary | ICD-10-CM | POA: Diagnosis not present

## 2018-03-16 DIAGNOSIS — E1159 Type 2 diabetes mellitus with other circulatory complications: Secondary | ICD-10-CM | POA: Diagnosis not present

## 2018-03-18 DIAGNOSIS — F0151 Vascular dementia with behavioral disturbance: Secondary | ICD-10-CM | POA: Diagnosis not present

## 2018-03-18 DIAGNOSIS — E1159 Type 2 diabetes mellitus with other circulatory complications: Secondary | ICD-10-CM | POA: Diagnosis not present

## 2018-03-18 DIAGNOSIS — N183 Chronic kidney disease, stage 3 (moderate): Secondary | ICD-10-CM | POA: Diagnosis not present

## 2018-03-18 DIAGNOSIS — I4891 Unspecified atrial fibrillation: Secondary | ICD-10-CM | POA: Diagnosis not present

## 2018-03-18 DIAGNOSIS — E46 Unspecified protein-calorie malnutrition: Secondary | ICD-10-CM | POA: Diagnosis not present

## 2018-03-18 DIAGNOSIS — I5030 Unspecified diastolic (congestive) heart failure: Secondary | ICD-10-CM | POA: Diagnosis not present

## 2018-03-23 DIAGNOSIS — E1159 Type 2 diabetes mellitus with other circulatory complications: Secondary | ICD-10-CM | POA: Diagnosis not present

## 2018-03-23 DIAGNOSIS — F0151 Vascular dementia with behavioral disturbance: Secondary | ICD-10-CM | POA: Diagnosis not present

## 2018-03-23 DIAGNOSIS — I4891 Unspecified atrial fibrillation: Secondary | ICD-10-CM | POA: Diagnosis not present

## 2018-03-23 DIAGNOSIS — E46 Unspecified protein-calorie malnutrition: Secondary | ICD-10-CM | POA: Diagnosis not present

## 2018-03-23 DIAGNOSIS — N183 Chronic kidney disease, stage 3 (moderate): Secondary | ICD-10-CM | POA: Diagnosis not present

## 2018-03-23 DIAGNOSIS — I5030 Unspecified diastolic (congestive) heart failure: Secondary | ICD-10-CM | POA: Diagnosis not present

## 2018-03-25 DIAGNOSIS — E1159 Type 2 diabetes mellitus with other circulatory complications: Secondary | ICD-10-CM | POA: Diagnosis not present

## 2018-03-25 DIAGNOSIS — N183 Chronic kidney disease, stage 3 (moderate): Secondary | ICD-10-CM | POA: Diagnosis not present

## 2018-03-25 DIAGNOSIS — I4891 Unspecified atrial fibrillation: Secondary | ICD-10-CM | POA: Diagnosis not present

## 2018-03-25 DIAGNOSIS — E46 Unspecified protein-calorie malnutrition: Secondary | ICD-10-CM | POA: Diagnosis not present

## 2018-03-25 DIAGNOSIS — I5030 Unspecified diastolic (congestive) heart failure: Secondary | ICD-10-CM | POA: Diagnosis not present

## 2018-03-25 DIAGNOSIS — F0151 Vascular dementia with behavioral disturbance: Secondary | ICD-10-CM | POA: Diagnosis not present

## 2018-03-26 DIAGNOSIS — N183 Chronic kidney disease, stage 3 (moderate): Secondary | ICD-10-CM | POA: Diagnosis not present

## 2018-03-26 DIAGNOSIS — E1159 Type 2 diabetes mellitus with other circulatory complications: Secondary | ICD-10-CM | POA: Diagnosis not present

## 2018-03-26 DIAGNOSIS — E46 Unspecified protein-calorie malnutrition: Secondary | ICD-10-CM | POA: Diagnosis not present

## 2018-03-26 DIAGNOSIS — F0151 Vascular dementia with behavioral disturbance: Secondary | ICD-10-CM | POA: Diagnosis not present

## 2018-03-26 DIAGNOSIS — I4891 Unspecified atrial fibrillation: Secondary | ICD-10-CM | POA: Diagnosis not present

## 2018-03-26 DIAGNOSIS — I5030 Unspecified diastolic (congestive) heart failure: Secondary | ICD-10-CM | POA: Diagnosis not present

## 2018-03-27 DIAGNOSIS — E1159 Type 2 diabetes mellitus with other circulatory complications: Secondary | ICD-10-CM | POA: Diagnosis not present

## 2018-03-27 DIAGNOSIS — I5030 Unspecified diastolic (congestive) heart failure: Secondary | ICD-10-CM | POA: Diagnosis not present

## 2018-03-27 DIAGNOSIS — E46 Unspecified protein-calorie malnutrition: Secondary | ICD-10-CM | POA: Diagnosis not present

## 2018-03-27 DIAGNOSIS — N183 Chronic kidney disease, stage 3 (moderate): Secondary | ICD-10-CM | POA: Diagnosis not present

## 2018-03-27 DIAGNOSIS — F0151 Vascular dementia with behavioral disturbance: Secondary | ICD-10-CM | POA: Diagnosis not present

## 2018-03-27 DIAGNOSIS — I4891 Unspecified atrial fibrillation: Secondary | ICD-10-CM | POA: Diagnosis not present

## 2018-03-29 DIAGNOSIS — N183 Chronic kidney disease, stage 3 (moderate): Secondary | ICD-10-CM | POA: Diagnosis not present

## 2018-03-29 DIAGNOSIS — I4891 Unspecified atrial fibrillation: Secondary | ICD-10-CM | POA: Diagnosis not present

## 2018-03-29 DIAGNOSIS — E46 Unspecified protein-calorie malnutrition: Secondary | ICD-10-CM | POA: Diagnosis not present

## 2018-03-29 DIAGNOSIS — E1159 Type 2 diabetes mellitus with other circulatory complications: Secondary | ICD-10-CM | POA: Diagnosis not present

## 2018-03-29 DIAGNOSIS — F0151 Vascular dementia with behavioral disturbance: Secondary | ICD-10-CM | POA: Diagnosis not present

## 2018-03-29 DIAGNOSIS — I5030 Unspecified diastolic (congestive) heart failure: Secondary | ICD-10-CM | POA: Diagnosis not present

## 2018-03-30 DIAGNOSIS — I4891 Unspecified atrial fibrillation: Secondary | ICD-10-CM | POA: Diagnosis not present

## 2018-03-30 DIAGNOSIS — N183 Chronic kidney disease, stage 3 (moderate): Secondary | ICD-10-CM | POA: Diagnosis not present

## 2018-03-30 DIAGNOSIS — Z79899 Other long term (current) drug therapy: Secondary | ICD-10-CM | POA: Diagnosis not present

## 2018-03-30 DIAGNOSIS — F0151 Vascular dementia with behavioral disturbance: Secondary | ICD-10-CM | POA: Diagnosis not present

## 2018-03-30 DIAGNOSIS — E46 Unspecified protein-calorie malnutrition: Secondary | ICD-10-CM | POA: Diagnosis not present

## 2018-03-30 DIAGNOSIS — E1159 Type 2 diabetes mellitus with other circulatory complications: Secondary | ICD-10-CM | POA: Diagnosis not present

## 2018-03-30 DIAGNOSIS — I5030 Unspecified diastolic (congestive) heart failure: Secondary | ICD-10-CM | POA: Diagnosis not present

## 2018-03-31 ENCOUNTER — Emergency Department (HOSPITAL_COMMUNITY)

## 2018-03-31 ENCOUNTER — Encounter (HOSPITAL_COMMUNITY): Payer: Self-pay

## 2018-03-31 ENCOUNTER — Other Ambulatory Visit: Payer: Self-pay

## 2018-03-31 ENCOUNTER — Emergency Department (HOSPITAL_COMMUNITY)
Admission: EM | Admit: 2018-03-31 | Discharge: 2018-03-31 | Disposition: A | Discharge status: Home / Self Care | Attending: Emergency Medicine | Admitting: Emergency Medicine

## 2018-03-31 DIAGNOSIS — Y999 Unspecified external cause status: Secondary | ICD-10-CM | POA: Diagnosis not present

## 2018-03-31 DIAGNOSIS — I110 Hypertensive heart disease with heart failure: Secondary | ICD-10-CM | POA: Insufficient documentation

## 2018-03-31 DIAGNOSIS — F0390 Unspecified dementia without behavioral disturbance: Secondary | ICD-10-CM | POA: Diagnosis not present

## 2018-03-31 DIAGNOSIS — S0990XA Unspecified injury of head, initial encounter: Secondary | ICD-10-CM | POA: Diagnosis present

## 2018-03-31 DIAGNOSIS — Y939 Activity, unspecified: Secondary | ICD-10-CM | POA: Diagnosis not present

## 2018-03-31 DIAGNOSIS — W19XXXA Unspecified fall, initial encounter: Secondary | ICD-10-CM | POA: Insufficient documentation

## 2018-03-31 DIAGNOSIS — M25561 Pain in right knee: Secondary | ICD-10-CM | POA: Diagnosis not present

## 2018-03-31 DIAGNOSIS — E119 Type 2 diabetes mellitus without complications: Secondary | ICD-10-CM | POA: Diagnosis not present

## 2018-03-31 DIAGNOSIS — Y92129 Unspecified place in nursing home as the place of occurrence of the external cause: Secondary | ICD-10-CM | POA: Insufficient documentation

## 2018-03-31 DIAGNOSIS — S0003XA Contusion of scalp, initial encounter: Secondary | ICD-10-CM | POA: Insufficient documentation

## 2018-03-31 DIAGNOSIS — Z7984 Long term (current) use of oral hypoglycemic drugs: Secondary | ICD-10-CM | POA: Diagnosis not present

## 2018-03-31 DIAGNOSIS — I5032 Chronic diastolic (congestive) heart failure: Secondary | ICD-10-CM | POA: Insufficient documentation

## 2018-03-31 DIAGNOSIS — Z87891 Personal history of nicotine dependence: Secondary | ICD-10-CM | POA: Diagnosis not present

## 2018-03-31 DIAGNOSIS — Z79899 Other long term (current) drug therapy: Secondary | ICD-10-CM | POA: Diagnosis not present

## 2018-03-31 LAB — CBG MONITORING, ED: Glucose-Capillary: 150 mg/dL — ABNORMAL HIGH (ref 70–99)

## 2018-03-31 MED ORDER — ACETAMINOPHEN 325 MG PO TABS
650.0000 mg | ORAL_TABLET | Freq: Once | ORAL | Status: AC
  Administered 2018-03-31: 650 mg via ORAL
  Filled 2018-03-31: qty 2

## 2018-03-31 NOTE — ED Provider Notes (Signed)
[facility] EMERGENCY DEPARTMENT Provider Note   CSN: [id number] Arrival date & time: 03/31/18  1818     History   Chief Complaint Chief Complaint  Patient presents with  . Fall    HPI  Cynthia Wright is a 85 y.o. female hx of afib, diabetes, hypertension, here presenting with fall.  Patient is from [facility].  Patient tripped and fell and landed on the knee and may have hit his head.  Patient demented and unable to give much history.   The history is provided by the EMS personnel.    Past Medical History:  Diagnosis Date  . Atrial fibrillation ([place])    s/p DCCV 07/2012  . Benign diastolic hypertension   . Chronic anticoagulation   . Chronic diastolic CHF (congestive heart failure) (H[facility])    EF now 65% by echo 10/2012 with grade II diasotlid dysfunction  . Diabetes mellitus without complication ([place])   . Dilated cardiomyopathy secondary to tachycardia ([place]) 07/2012   2D echo 10/2012 showed EF 65% with grade II diastolic dysfunction  . Elevated cholesterol   . Hyperlipidemia   . Hypertension   . Liver cyst   . Memory loss     Patient Active Problem List   Diagnosis Date Noted  . Elevated LFTs 12/22/2016  . HCAP (healthcare-associated pneumonia)   . Dementia with behavioral disturbance ([place])   . Hypokalemia   . Postoperative anemia   . Hyperlipidemia   . E. coli UTI   . Closed displaced fracture of right femoral neck ([place]) 09/28/2016  . Fracture of left leg 09/27/2016  . Fall 07/12/2016  . Leukocytosis 07/12/2016  . Type 2 diabetes mellitus with hyperlipidemia ([place]) 07/12/2016  . Acetabular fracture ([place]) 07/11/2016  . Dilated cardiomyopathy secondary to tachycardia ([place]) 12/14/2013  . Syncope 06/14/2013  . Bradycardia 05/11/2013  . Fatigue 05/11/2013  . Chronic diastolic CHF (congestive heart failure) ([place]) 01/06/2013  . Mitral regurgitation 08/06/2012  . PAF (paroxysmal atrial fibrillation) ([place]) 08/02/2012  . Benign essential  HTN 08/02/2012  . Pure hypercholesterolemia 08/02/2012  . Liver cyst 08/02/2012    Past Surgical History:  Procedure Laterality Date  . ANTERIOR APPROACH HEMI HIP ARTHROPLASTY Right 09/28/2016   Procedure: ANTERIOR APPROACH HEMI HIP ARTHROPLASTY;  Surgeon: Leandrew Koyanagi, MD;  Location: [facility];  Service: Orthopedics;  Laterality: Right;  . APPENDECTOMY    . BACK SURGERY     herniated disc  . CARDIOVERSION N/A 08/09/2012   Procedure: CARDIOVERSION;  Surgeon: Sueanne Margarita, MD;  Location: [facility];  Service: Cardiovascular;  Laterality: N/A;  . CARDIOVERSION N/A 09/28/2014   Procedure: CARDIOVERSION;  Surgeon: Sueanne Margarita, MD;  Location: [facility] ENDOS[facility];  Service: Cardiovascular;  Laterality: N/A;  . CHOLECYSTECTOMY    . TEE WITHOUT CARDIOVERSION N/A 08/09/2012   Procedure: TRANSESOPHAGEAL ECHOCARDIOGRAM (TEE);  Surgeon: Sueanne Margarita, MD;  Location: [facility] ENDOSCOPY;  Service: Cardiovascular;  Laterality: N/A;  talk to angie in [place].  . TONSILLECTOMY       OB History   No obstetric history on file.      Home Medications    Prior to Admission medications   Medication Sig Start Date End Date Taking? Authorizing Provider  acetaminophen (TYLENOL) 500 MG tablet Take 500 mg by mouth 2 (two) times daily. Take 500mg  by mouth two times a day for pain    [provider]  amiodarone (PACERONE) 100 MG tablet Take 100 mg by mouth daily.    [provider]  amiodarone (PACERONE) 200 MG tablet Take 1 tablet (200 mg total) by mouth daily. Patient taking differently: Take 100 mg by mouth daily.  10/20/16   Rosita Fire, MD  bacitracin ointment Apply 1 application topically 2 (two) times daily. For 10 days or until healed Patient not taking: Reported on 11/05/2017 10/06/17   Duffy Bruce, MD  buPROPion [facility] SR) 150 MG 12 hr tablet Take 150 mg 2 (two) times daily by mouth.    [provider]  busPIRone (BUSPAR) 7.5 MG tablet Take 7.5 mg by mouth 2 (two) times  daily. 08/14/17   [provider]  cephALEXin (KEFLEX) 500 MG capsule Take 1 capsule (500 mg total) by mouth 3 (three) times daily. Patient not taking: Reported on 01/26/2018 12/15/17   Horton, Barbette Hair, MD  diltiazem (CARDIZEM CD) 240 MG 24 hr capsule Take 1 capsule (240 mg total) by mouth daily. Patient not taking: Reported on 01/26/2018 12/16/16 11/05/17  Sueanne Margarita, MD  diltiazem (DILACOR XR) 240 MG 24 hr capsule Take 240 mg by mouth daily.    [provider]  divalproex (DEPAKOTE SPRINKLE) 125 MG capsule Take 125 mg by mouth 2 (two) times daily. 10/23/17   [provider]  docusate sodium (COLACE) 100 MG capsule Take 200 mg by mouth at bedtime.     [provider]  ELIQUIS 2.5 MG TABS tablet TAKE 1 TABLET (2.5 MG TOTAL) BY MOUTH 2 (TWO) TIMES DAILY. Patient not taking: Reported on 01/26/2018 04/21/16   Sueanne Margarita, MD  ENSURE (ENSURE) Take 237 mLs by mouth 3 (three) times daily after meals.    [provider]  furosemide (LASIX) 40 MG tablet Take 1 tablet (40 mg total) by mouth daily. 12/27/16   Lavina Hamman, MD  ibuprofen (ADVIL,MOTRIN) 800 MG tablet Take 1 tablet (800 mg total) by mouth every 8 (eight) hours as needed. Patient not taking: Reported on 11/05/2017 04/12/17   Dalia Heading, PA-C  iron polysaccharides (NIFEREX) 150 MG capsule Take 1 capsule (150 mg total) by mouth daily. 09/30/16   Barton Dubois, MD  KLOR-CON 20 MEQ packet Take 20 mEq by mouth daily. With food 07/31/17   [provider]  lisinopril (PRINIVIL,ZESTRIL) 10 MG tablet Take 20 mg by mouth daily. 08/17/17   [provider]  LORazepam (ATIVAN) 0.5 MG tablet Take 0.25-0.5 mg by mouth See admin instructions. Takes 0.5 scheduled twice daily. May give additional 0.25 every 6 hours as needed for agitation and anxiety.    [provider]  Melatonin 10 MG TABS Take 10 mg by mouth at bedtime.     [provider]  polyethylene glycol (MIRALAX  / GLYCOLAX) packet Take 17 g by mouth daily.    [provider]  potassium chloride SA (KLOR-CON M20) 20 MEQ tablet Take 2 tablets (40 mEq total) by mouth every other day. Patient not taking: Reported on 11/05/2017 05/23/16   Sueanne Margarita, MD  prochlorperazine (COMPAZINE) 5 MG tablet Take 5 mg by mouth every 6 (six) hours as needed for nausea or vomiting.    [provider]  rosuvastatin (CRESTOR) 5 MG tablet Take 5 mg by mouth at bedtime.     [provider]  sitaGLIPtin (JANUVIA) 100 MG tablet Take 100 mg by mouth daily.    [provider]  traMADol (ULTRAM) 50 MG tablet Take 1 tablet (50 mg total) by mouth every 6 (six) hours as needed for severe pain. 01/26/18   Lawyer,  Harrell Gave, PA-C    Family History Family History  Problem Relation Age of Onset  . Heart disease Mother   . CAD Mother   . Heart disease Father   . CAD Father   . Pancreatic cancer Brother     Social History Social History   Tobacco Use  . Smoking status: Former Research scientist (life sciences)  . Smokeless tobacco: Never Used  Substance Use Topics  . Alcohol use: Yes    Alcohol/week: 1.0 - 2.0 standard drinks    Types: 1 - 2 Glasses of wine per week  . Drug use: No     Allergies   Codeine; Lortab [hydrocodone-acetaminophen]; and Hydrocodone   Review of Systems Review of Systems  Musculoskeletal:       R leg pain   All other systems reviewed and are negative.    Physical Exam Updated Vital Signs BP (!) 166/48   Pulse (!) 53   Temp (!) 97.2 F (36.2 C) (Oral)   Resp 14   Ht 5\' 8"  (1.727 m)   Wt 45 kg   SpO2 100%   BMI 15.08 kg/m   Physical Exam Vitals signs and nursing note reviewed.  Constitutional:      Comments: Demented, NAD   HENT:     Head: Normocephalic.     Comments: ? Posterior scalp hematoma     Nose: Nose normal.     Mouth/Throat:     Mouth: Mucous membranes are moist.  Eyes:     Pupils: Pupils are equal, round, and reactive to light.  Neck:      Musculoskeletal: Normal range of motion.  Cardiovascular:     Rate and Rhythm: Normal rate.  Pulmonary:     Effort: Pulmonary effort is normal.  Abdominal:     General: Abdomen is flat.     Palpations: Abdomen is soft.  Musculoskeletal:     Comments: R lower leg with bruising and small wound (unclear if it is old or not). Nl ROM bilateral hips   Skin:    General: Skin is warm.     Capillary Refill: Capillary refill takes less than 2 seconds.  Neurological:     General: No focal deficit present.  Psychiatric:        Mood and Affect: Mood normal.        Behavior: Behavior normal.      ED Treatments / Results  Labs (all labs ordered are listed, but only abnormal results are displayed) Labs Reviewed  CBG MONITORING, ED - Abnormal; Notable for the following components:      Result Value   Glucose-Capillary 150 (*)    All other components within normal limits    EKG None  Radiology Dg Chest 1 View  Result Date: 03/31/2018 CLINICAL DATA:  Status post fall with bilateral knee pain. EXAM: CHEST  1 VIEW COMPARISON:  October 06, 2017 FINDINGS: The heart size and mediastinal contours are within normal limits. Bilateral symmetrical nipple shadows are identified in the lung bases. Mild increased pulmonary interstitium is identified bilaterally. There is no focal pneumonia or pleural effusion. The visualized skeletal structures are unremarkable. IMPRESSION: Mild increased pulmonary interstitium bilaterally which may be due to mild interstitial edema. No definite acute abnormalities identified in the visualized bony structures. Electronically Signed   By: Abelardo Diesel M.D.   On: 03/31/2018 19:57   Dg Pelvis 1-2 Views  Result Date: 03/31/2018 CLINICAL DATA:  Status post fall with bilateral knee pain. EXAM: PELVIS - 1-2 VIEW COMPARISON:  Aug 23, 2017 FINDINGS: Chronic deformity of the right hemipelvis is identified with right hip replacement unchanged compared prior exam. No acute fracture or  dislocation is identified. IMPRESSION: Chronic change of the right hemipelvis and hip. No acute fracture or dislocation is noted. Electronically Signed   By: Abelardo Diesel M.D.   On: 03/31/2018 19:58   Dg Tibia/fibula Right  Result Date: 03/31/2018 CLINICAL DATA:  Status post fall with right knee pain. EXAM: RIGHT TIBIA AND FIBULA - 2 VIEW COMPARISON:  None. FINDINGS: There is no evidence of fracture or dislocation. Degenerative joint changes of the right knee are noted. IMPRESSION: No acute fracture or dislocation. Electronically Signed   By: Abelardo Diesel M.D.   On: 03/31/2018 19:59   Ct Head Wo Contrast  Result Date: 03/31/2018 CLINICAL DATA:  Unwitnessed fall at senior living facility. EXAM: CT HEAD WITHOUT CONTRAST CT CERVICAL SPINE WITHOUT CONTRAST TECHNIQUE: Multidetector CT imaging of the head and cervical spine was performed following the standard protocol without intravenous contrast. Multiplanar CT image reconstructions of the cervical spine were also generated. COMPARISON:  01/26/2018 CT head and C-spine. FINDINGS: CT HEAD FINDINGS Brain: Stable chronic faint calcification in the posterior right pons without mass-effect. No evidence of parenchymal hemorrhage or extra-axial fluid collection. No mass lesion, mass effect, or midline shift. No CT evidence of acute infarction. Generalized cerebral volume loss. Nonspecific moderate subcortical and periventricular white matter hypodensity, most in keeping with chronic small vessel ischemic change. Cerebral ventricle sizes are stable and concordant with the degree of cerebral volume loss. Vascular: No acute abnormality. Skull: No evidence of calvarial fracture. Sinuses/Orbits: The visualized paranasal sinuses are essentially clear. Other:  The mastoid air cells are unopacified. CT CERVICAL SPINE FINDINGS Alignment: Normal cervical lordosis. No facet subluxation. Dens is well positioned between the lateral masses of C1. Skull base and vertebrae: No acute  fracture. No primary bone lesion or focal pathologic process. Soft tissues and spinal canal: No prevertebral edema. No visible canal hematoma. Disc levels: Moderate multilevel cervical degenerative disc disease, most prominent C5-6. Mild bilateral facet arthropathy. No significant degenerative foraminal stenosis. Upper chest: No acute abnormality. Other: Visualized mastoid air cells appear clear. Stable multinodular goiter asymmetrically enlarged on the right. No pathologically enlarged cervical nodes. IMPRESSION: 1. No evidence of acute intracranial abnormality. No evidence of calvarial fracture. 2. Generalized cerebral volume loss and moderate chronic small vessel ischemic changes in the cerebral white matter. 3. No cervical spine fracture or subluxation. 4. Moderate multilevel cervical degenerative disc disease and mild facet arthropathy. 5. Multinodular goiter. Electronically Signed   By: Ilona Sorrel M.D.   On: 03/31/2018 20:01   Ct Cervical Spine Wo Contrast  Result Date: 03/31/2018 CLINICAL DATA:  Unwitnessed fall at senior living facility. EXAM: CT HEAD WITHOUT CONTRAST CT CERVICAL SPINE WITHOUT CONTRAST TECHNIQUE: Multidetector CT imaging of the head and cervical spine was performed following the standard protocol without intravenous contrast. Multiplanar CT image reconstructions of the cervical spine were also generated. COMPARISON:  01/26/2018 CT head and C-spine. FINDINGS: CT HEAD FINDINGS Brain: Stable chronic faint calcification in the posterior right pons without mass-effect. No evidence of parenchymal hemorrhage or extra-axial fluid collection. No mass lesion, mass effect, or midline shift. No CT evidence of acute infarction. Generalized cerebral volume loss. Nonspecific moderate subcortical and periventricular white matter hypodensity, most in keeping with chronic small vessel ischemic change. Cerebral ventricle sizes are stable and concordant with the degree of cerebral volume loss. Vascular: No  acute abnormality. Skull: No evidence of  calvarial fracture. Sinuses/Orbits: The visualized paranasal sinuses are essentially clear. Other:  The mastoid air cells are unopacified. CT CERVICAL SPINE FINDINGS Alignment: Normal cervical lordosis. No facet subluxation. Dens is well positioned between the lateral masses of C1. Skull base and vertebrae: No acute fracture. No primary bone lesion or focal pathologic process. Soft tissues and spinal canal: No prevertebral edema. No visible canal hematoma. Disc levels: Moderate multilevel cervical degenerative disc disease, most prominent C5-6. Mild bilateral facet arthropathy. No significant degenerative foraminal stenosis. Upper chest: No acute abnormality. Other: Visualized mastoid air cells appear clear. Stable multinodular goiter asymmetrically enlarged on the right. No pathologically enlarged cervical nodes. IMPRESSION: 1. No evidence of acute intracranial abnormality. No evidence of calvarial fracture. 2. Generalized cerebral volume loss and moderate chronic small vessel ischemic changes in the cerebral white matter. 3. No cervical spine fracture or subluxation. 4. Moderate multilevel cervical degenerative disc disease and mild facet arthropathy. 5. Multinodular goiter. Electronically Signed   By: Ilona Sorrel M.D.   On: 03/31/2018 20:01    Procedures Procedures (including critical care time)  Medications Ordered in ED Medications  acetaminophen (TYLENOL) tablet 650 mg (650 mg Oral Given 03/31/18 1902)     Initial Impression / Assessment and Plan / ED Course  I have reviewed the triage vital signs and the nursing notes.  Pertinent labs & imaging results that were available during my care of the patient were reviewed by me and considered in my medical decision making (see chart for details).    Cynthia Wright is a 85 y.o. female here with fall. Unwitnessed fall, patient demented and unable to give much history. Will get CT head/neck, xrays.   8:58  PM xrays unremarkable, CT head/neck unremarkable. Stable for discharge back to facility.    Final Clinical Impressions(s) / ED Diagnoses   Final diagnoses:  None    ED Discharge Orders    None       Drenda Freeze, MD 03/31/18 [phone]

## 2018-03-31 NOTE — Discharge Instructions (Signed)
Continue your current meds.   Fall precautions.   See your doctor  Return to ER if you have another fall, vomiting, lethargy.

## 2018-03-31 NOTE — ED Triage Notes (Signed)
Per [facility]: Patient to ED c/o neck and bilateral knee pain after unwitnessed fall at [facility] senior living facility. Patient denies hitting head or LOC - states that she tripped and fell, landed on both knees and then rolled over onto her R side. Per EMS, patient A&O per her baseline. EMS VS: 172/64, P 52, RR 16, 100% RA, CBG 182.

## 2018-03-31 NOTE — ED Notes (Signed)
[facility] called for pt transport. 

## 2018-03-31 NOTE — ED Notes (Signed)
Report given to [facility], d/c instructions explained to pt.  Patient verbalizes understanding of discharge instructions. Opportunity for questioning and answers were provided. Armband removed by staff, pt discharged from ED

## 2018-06-30 DEATH — deceased

## 2018-10-23 IMAGING — CR DG HIP (WITH OR WITHOUT PELVIS) 2-3V*R*
3 series · 3 of 3 positions shown · non-contrast
Comparison: None.

CLINICAL DATA: Fall landing on the right hip.  Right hip pain.

EXAM:
DG HIP (WITH OR WITHOUT PELVIS) 2-3V RIGHT

[x pelvis]
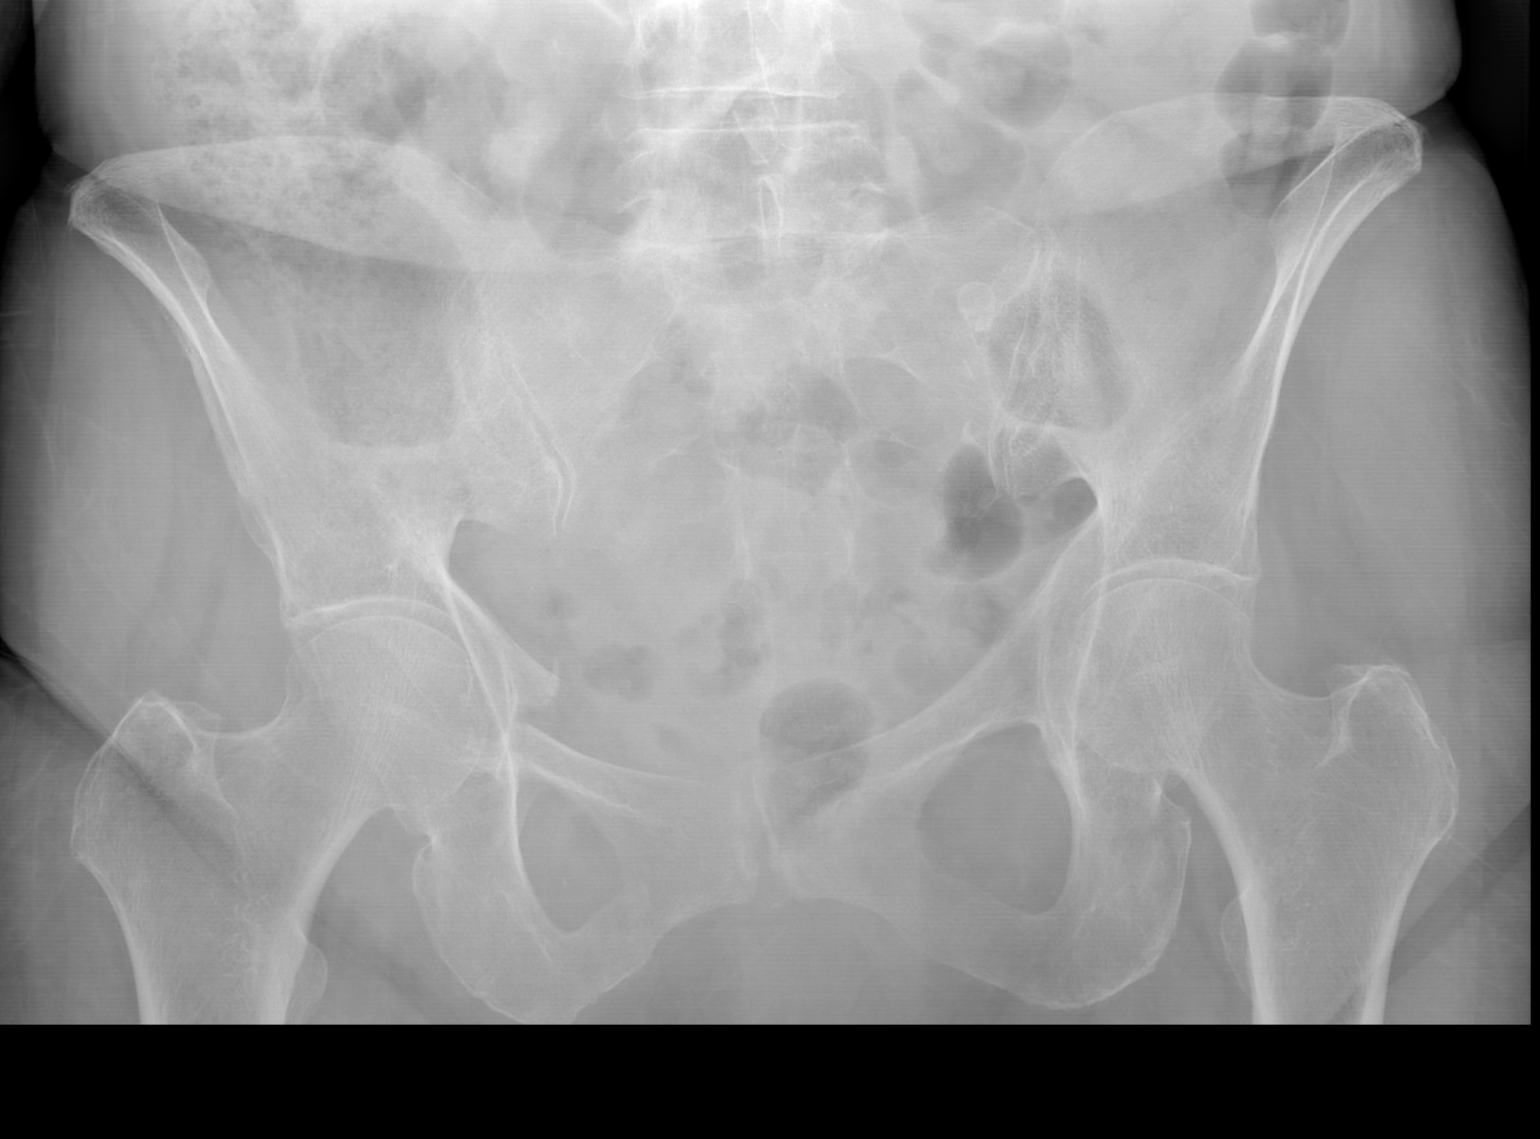

[x hip ap right]
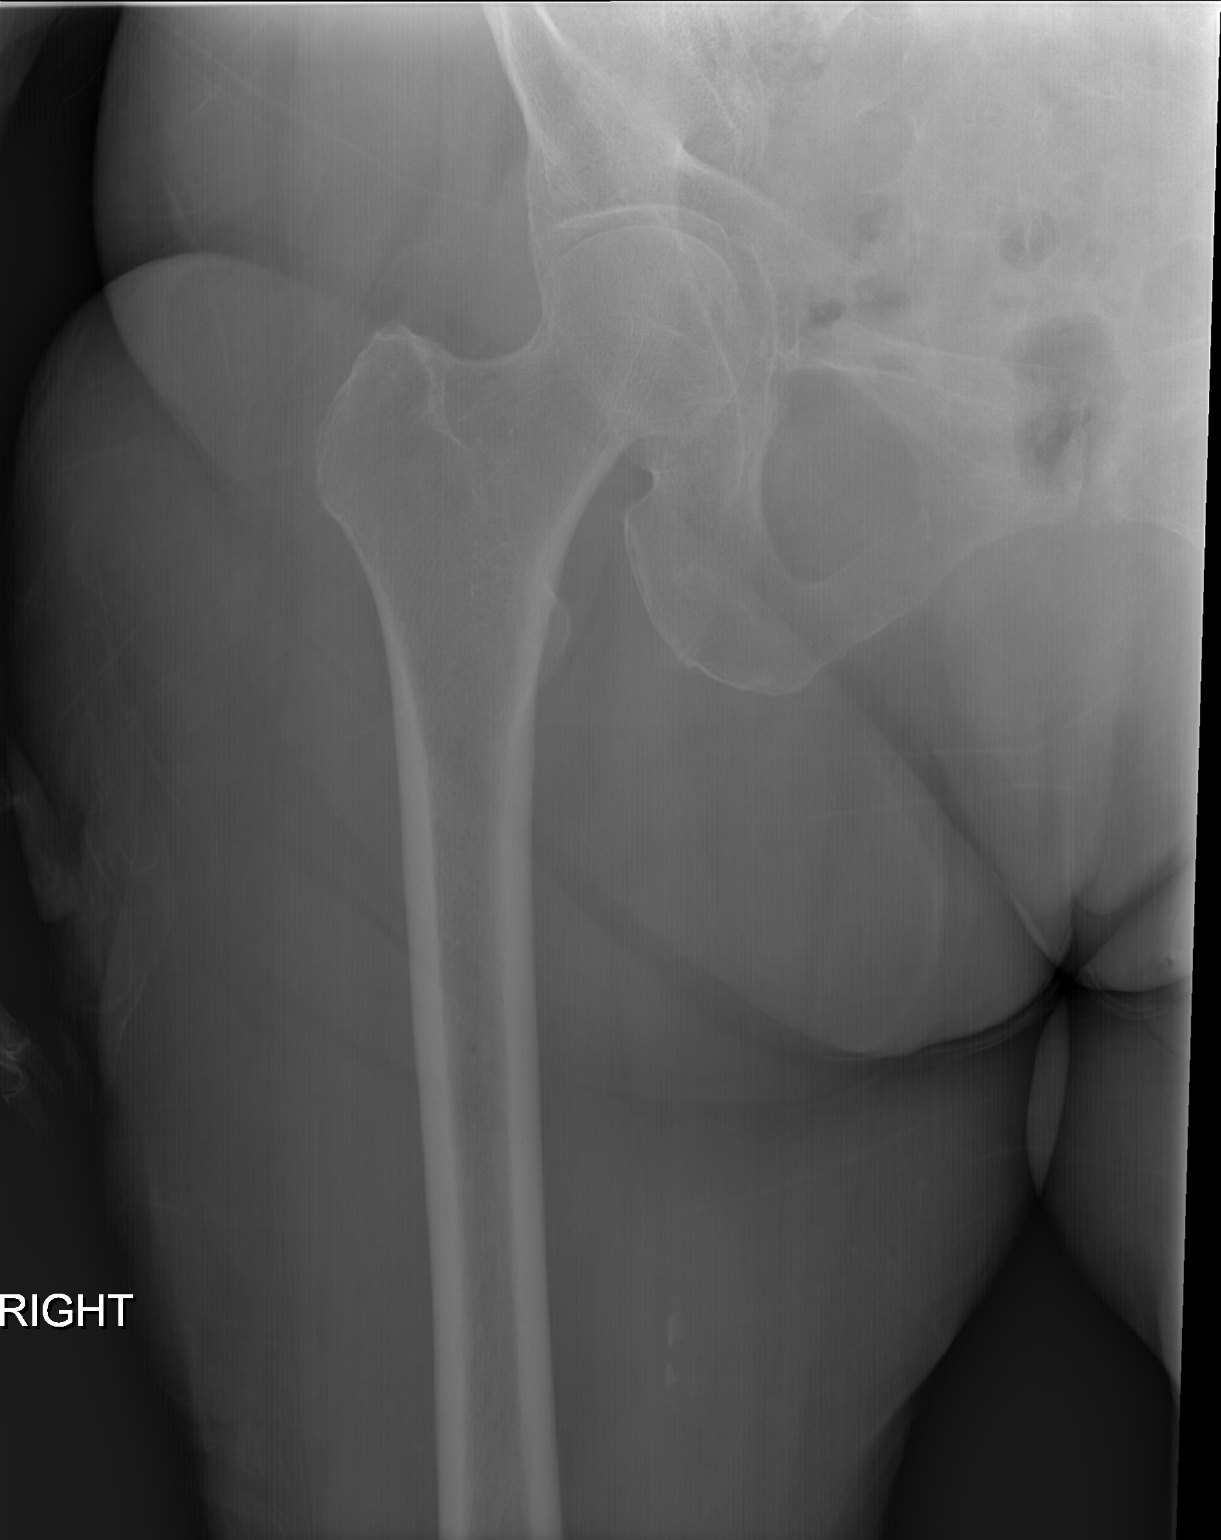

[x hip lat right]
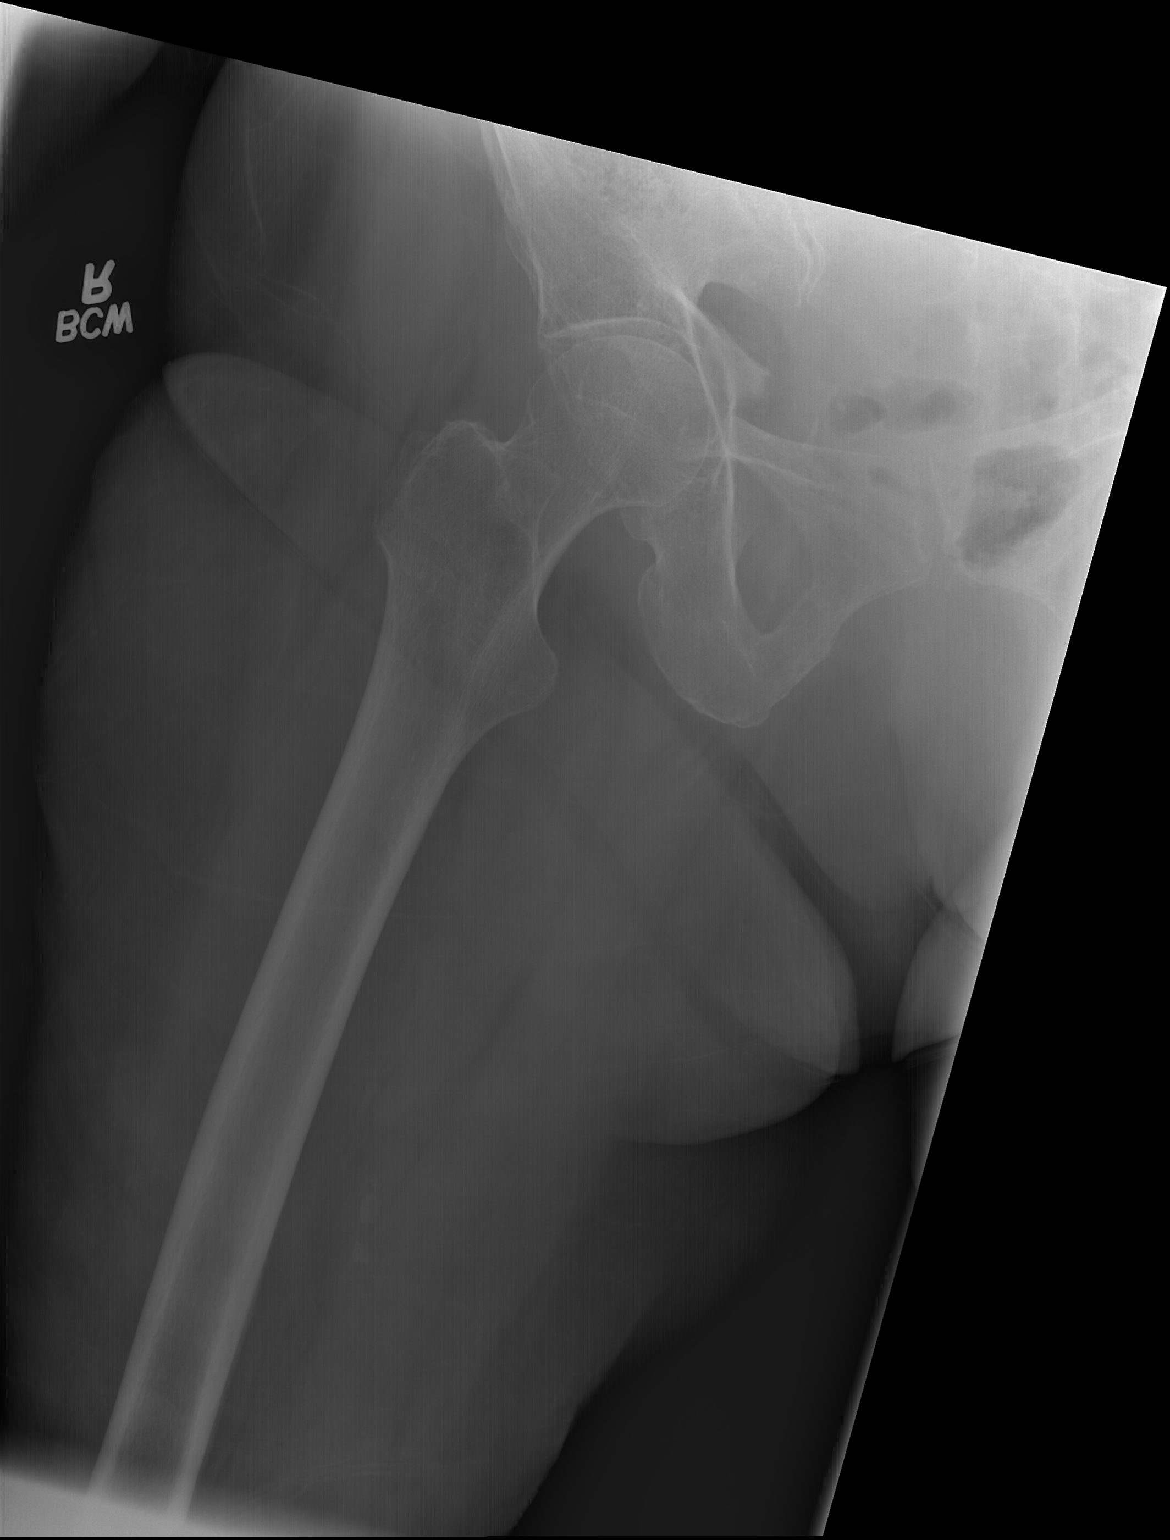

[3 of 3 positions shown; findings below may reference images not displayed]

FINDINGS: There is an acute acetabular fracture on the right with displaced
disruption of the iliopubic line.

Reduced intervertebral disc height at L5-S1.

Bony demineralization.

Questionable inferior pubic ramus fracture adjacent to the pubic
body.
IMPRESSION: 1. Acute right acetabular fracture. Possible right inferior pubic
ramus fracture. Consider CT of the bony pelvis for further
characterization.

## 2019-01-09 IMAGING — CT CT HIP*R* W/O CM
2 of 3 series · 17 of 46 positions shown, 19 images · non-contrast
Comparison: Radiographs 09/27/2016 hand CT scan 07/11/2016

CLINICAL DATA: Evaluate subcapital fracture.

EXAM:
CT OF THE RIGHT HIP WITHOUT CONTRAST
TECHNIQUE: Multidetector CT imaging of the right hip was performed according to
the standard protocol. Multiplanar CT image reconstructions were
also generated.

[Series 3: pelvis st · axial · 0.35mm/px · z∈[+884,+1059]mm · 14 of 41 slices shown, 16 images]
[im 3/41  soft-tissue]
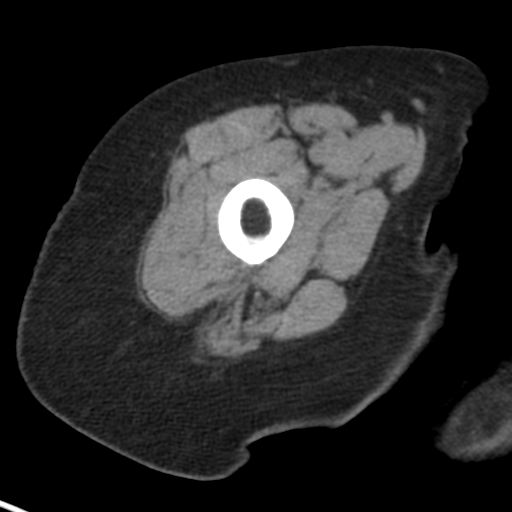
[im 3/41  bone]
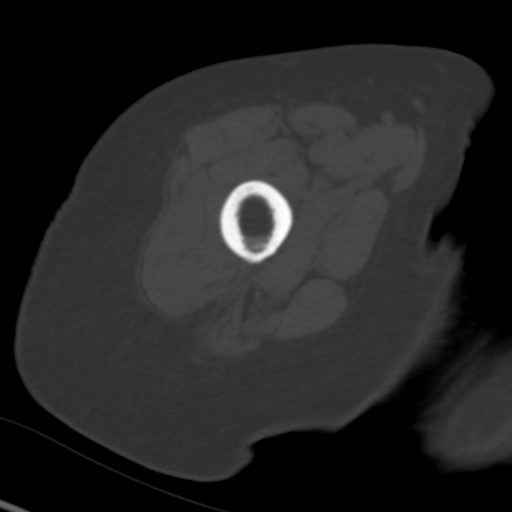
[im 6/41  soft-tissue]
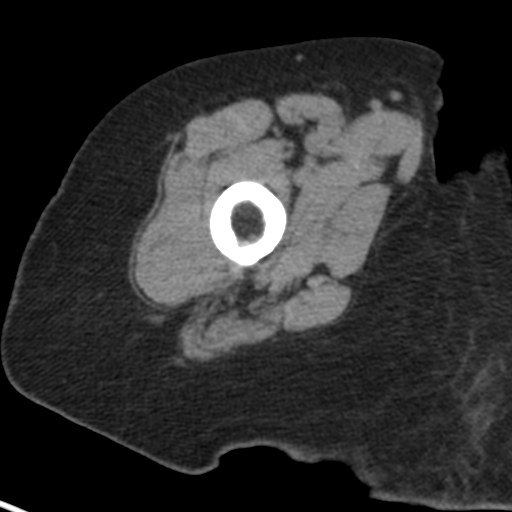
[im 8/41  soft-tissue]
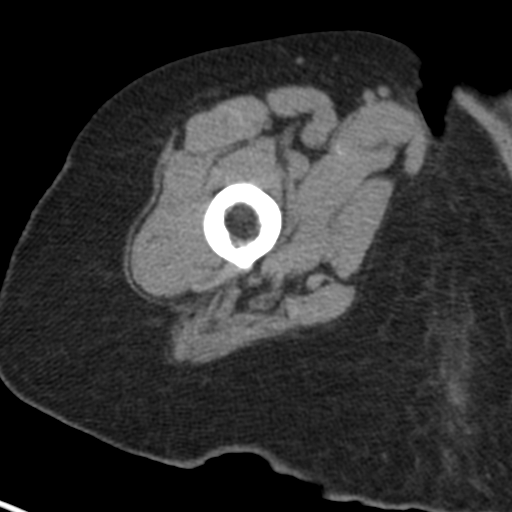
[im 11/41  soft-tissue]
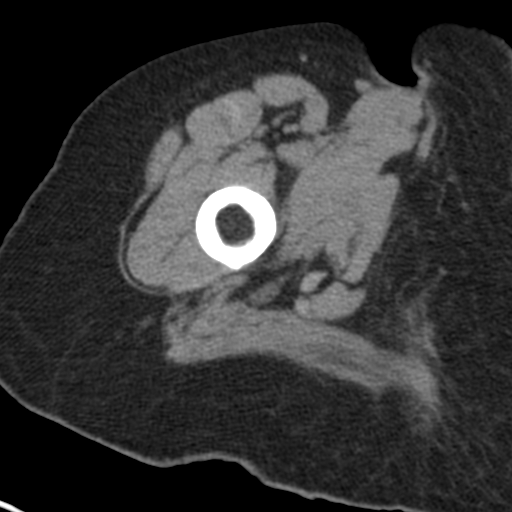
[im 13/41  soft-tissue]
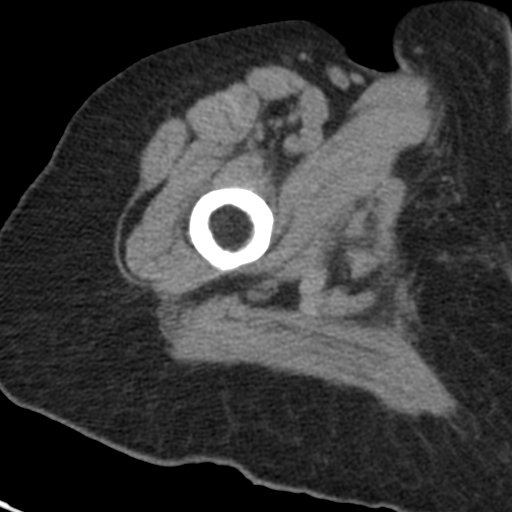
[im 16/41  soft-tissue]
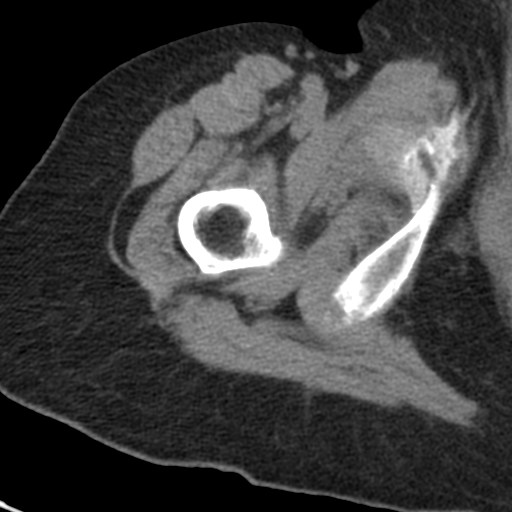
[im 19/41  soft-tissue]
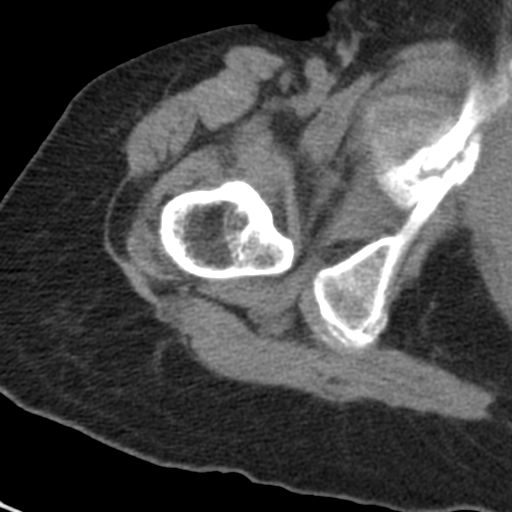
[im 22/41  soft-tissue]
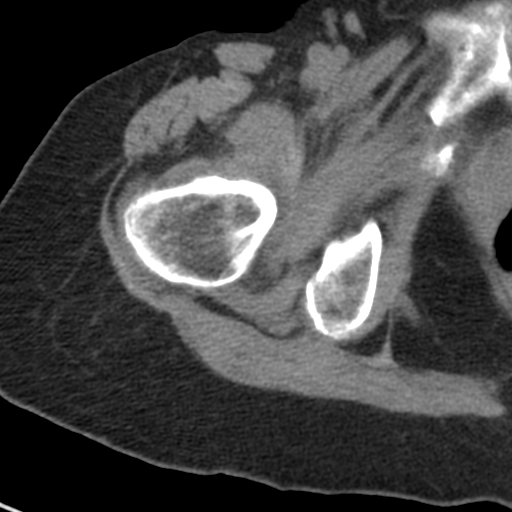
[im 25/41  soft-tissue]
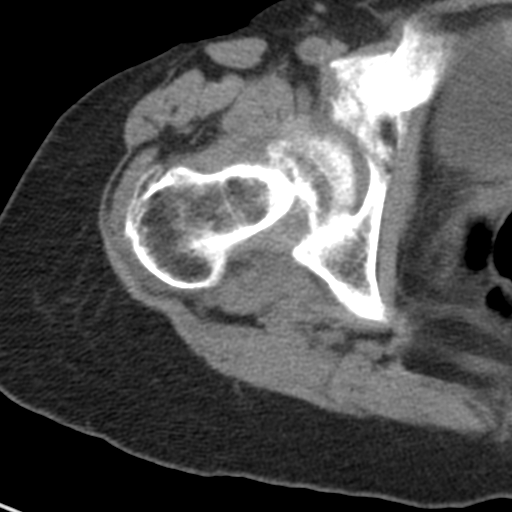
[im 25/41  bone]
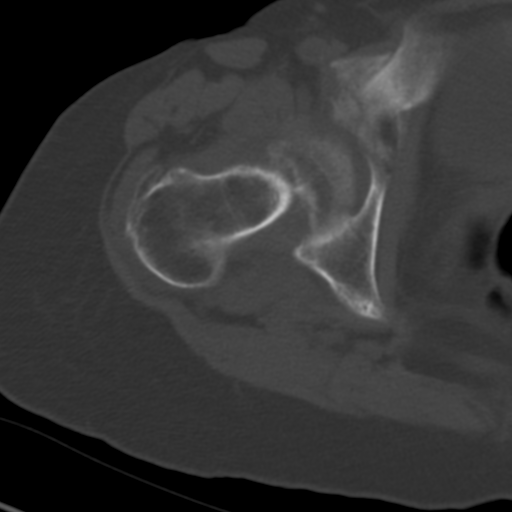
[im 28/41  soft-tissue]
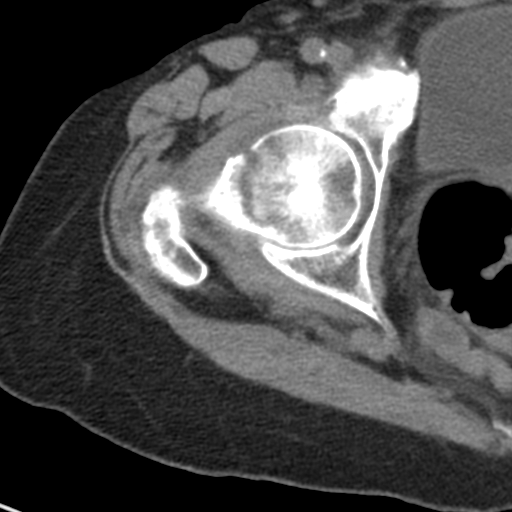
[im 30/41  soft-tissue]
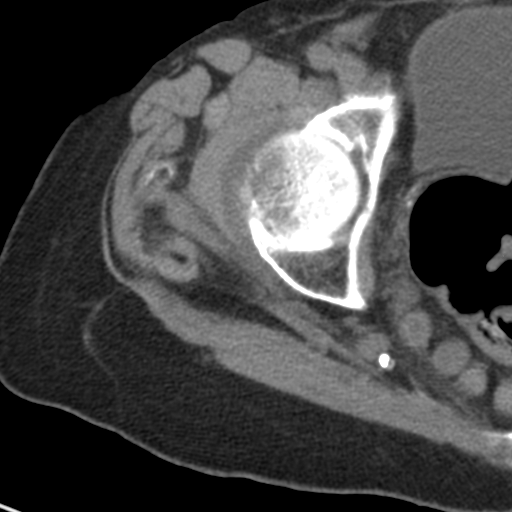
[im 33/41  soft-tissue]
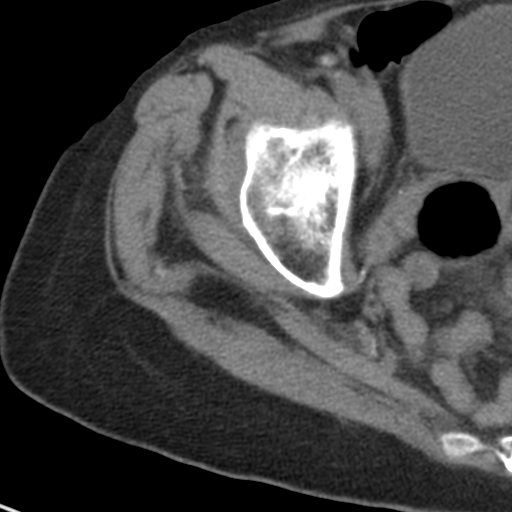
[im 35/41  soft-tissue]
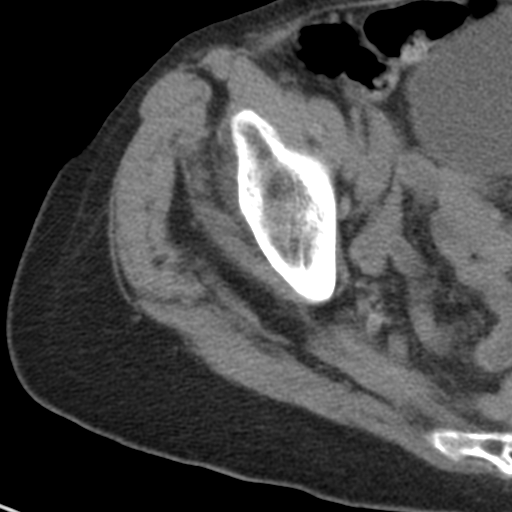
[im 38/41  soft-tissue]
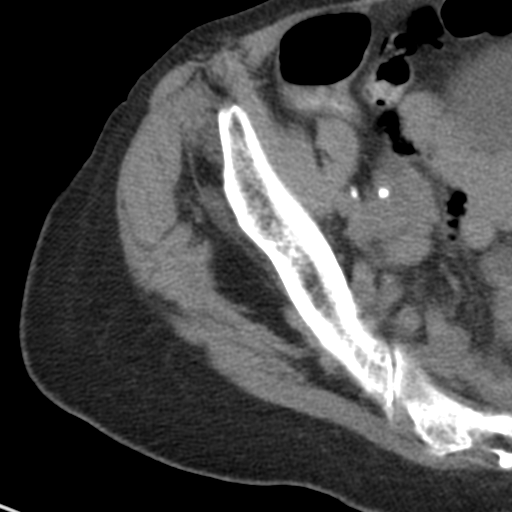

[Series 7: coronal images · coronal · 0.31mm/px · 3 of 65 slices shown]
[im 22/65  soft-tissue]
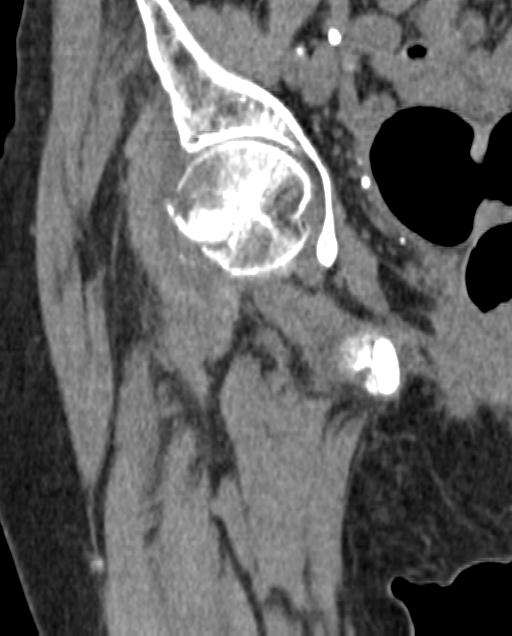
[im 29/65  soft-tissue]
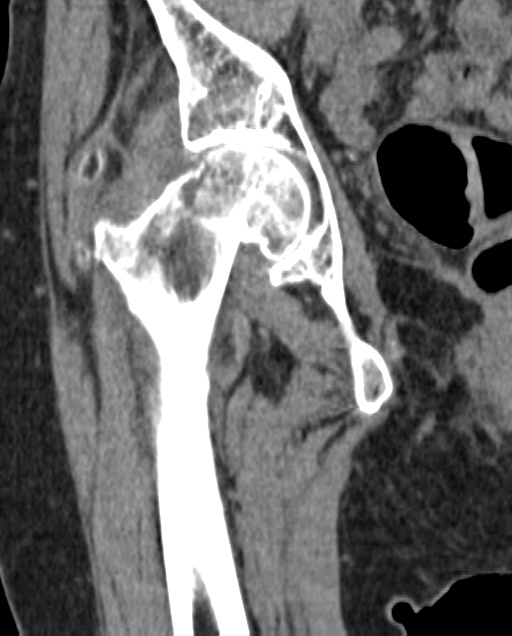
[im 36/65  soft-tissue]
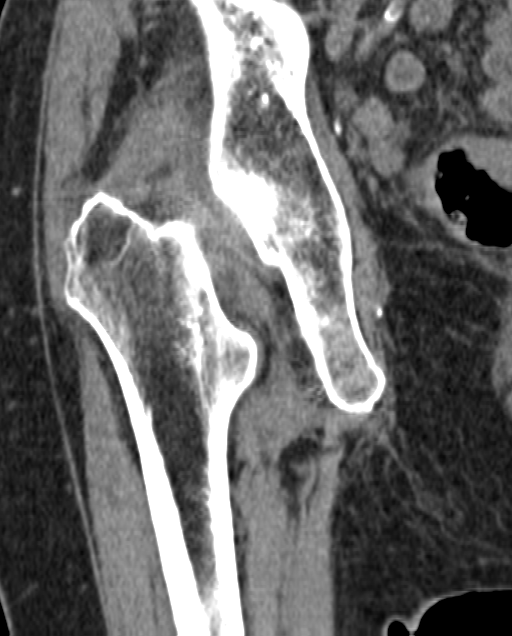

[17 of 46 positions shown; findings below may reference images not displayed]

FINDINGS: There is a wound mildly impacted and slightly displaced subcapital
fracture of the right hip. There is mild internal rotation of the
femoral head and slight lateral displacement of the femoral neck,
giving a slight varus deformity.

Moderate right hip joint degenerative changes with joint space
narrowing and spurring. The acetabulum is intact. No fracture. There
are partially united superior and inferior pubic rami fractures on
the right side. Exuberant callus formation.

The visualized right SI joint appears normal. The surrounding hip
and pelvic musculature are grossly normal. No significant
intrapelvic abnormalities on the right side.
IMPRESSION: 1. Mildly impacted and slightly displaced subcapital fracture of the
right hip as described above.
2. Partially united superior and inferior pubic rami fractures on
the right.
